# Patient Record
Sex: Female | Born: 1935
Health system: Southern US, Community
[De-identification: ages and names within clinical notes are randomized; demographics above are authoritative.]

## PROBLEM LIST (undated history)

## (undated) DIAGNOSIS — M8580 Other specified disorders of bone density and structure, unspecified site: Secondary | ICD-10-CM

## (undated) DIAGNOSIS — J301 Allergic rhinitis due to pollen: Secondary | ICD-10-CM

## (undated) DIAGNOSIS — K219 Gastro-esophageal reflux disease without esophagitis: Secondary | ICD-10-CM

## (undated) DIAGNOSIS — J45909 Unspecified asthma, uncomplicated: Secondary | ICD-10-CM

## (undated) DIAGNOSIS — E039 Hypothyroidism, unspecified: Secondary | ICD-10-CM

## (undated) DIAGNOSIS — K589 Irritable bowel syndrome without diarrhea: Secondary | ICD-10-CM

## (undated) DIAGNOSIS — F0390 Unspecified dementia without behavioral disturbance: Secondary | ICD-10-CM

## (undated) DIAGNOSIS — T7840XA Allergy, unspecified, initial encounter: Secondary | ICD-10-CM

## (undated) HISTORY — DX: Unspecified asthma, uncomplicated: J45.909

## (undated) HISTORY — DX: Allergic rhinitis due to pollen: J30.1

## (undated) HISTORY — DX: Unspecified dementia, unspecified severity, without behavioral disturbance, psychotic disturbance, mood disturbance, and anxiety: F03.90

## (undated) HISTORY — PX: TUBAL LIGATION: SHX77

## (undated) HISTORY — DX: Allergy, unspecified, initial encounter: T78.40XA

## (undated) HISTORY — PX: BASAL CELL CARCINOMA EXCISION: SHX1214

## (undated) HISTORY — DX: Gastro-esophageal reflux disease without esophagitis: K21.9

## (undated) HISTORY — PX: HEMORRHOID SURGERY: SHX153

## (undated) HISTORY — PX: CHOLECYSTECTOMY: SHX55

## (undated) HISTORY — PX: ANAL FISSURE REPAIR: SHX2312

## (undated) HISTORY — DX: Hypothyroidism, unspecified: E03.9

## (undated) HISTORY — DX: Other specified disorders of bone density and structure, unspecified site: M85.80

## (undated) HISTORY — DX: Irritable bowel syndrome, unspecified: K58.9

## (undated) HISTORY — PX: COLONOSCOPY: SHX174

## (undated) SURGERY — Surgical Case
Anesthesia: *Unknown

---

## 2006-05-02 HISTORY — PX: CHOLECYSTECTOMY: SHX55

## 2011-05-26 DIAGNOSIS — H903 Sensorineural hearing loss, bilateral: Secondary | ICD-10-CM | POA: Diagnosis not present

## 2011-05-26 DIAGNOSIS — H6120 Impacted cerumen, unspecified ear: Secondary | ICD-10-CM | POA: Diagnosis not present

## 2011-05-26 DIAGNOSIS — J370 Chronic laryngitis: Secondary | ICD-10-CM | POA: Diagnosis not present

## 2011-07-07 DIAGNOSIS — Q1389 Other congenital malformations of anterior segment of eye: Secondary | ICD-10-CM | POA: Diagnosis not present

## 2011-07-07 DIAGNOSIS — Q132 Other congenital malformations of iris: Secondary | ICD-10-CM | POA: Diagnosis not present

## 2011-07-07 DIAGNOSIS — H2510 Age-related nuclear cataract, unspecified eye: Secondary | ICD-10-CM | POA: Diagnosis not present

## 2011-07-07 DIAGNOSIS — L03019 Cellulitis of unspecified finger: Secondary | ICD-10-CM | POA: Diagnosis not present

## 2011-07-25 DIAGNOSIS — D485 Neoplasm of uncertain behavior of skin: Secondary | ICD-10-CM | POA: Diagnosis not present

## 2011-07-25 DIAGNOSIS — L738 Other specified follicular disorders: Secondary | ICD-10-CM | POA: Diagnosis not present

## 2011-08-24 DIAGNOSIS — H903 Sensorineural hearing loss, bilateral: Secondary | ICD-10-CM | POA: Diagnosis not present

## 2011-08-24 DIAGNOSIS — H6120 Impacted cerumen, unspecified ear: Secondary | ICD-10-CM | POA: Diagnosis not present

## 2011-08-24 DIAGNOSIS — J370 Chronic laryngitis: Secondary | ICD-10-CM | POA: Diagnosis not present

## 2011-10-12 DIAGNOSIS — H903 Sensorineural hearing loss, bilateral: Secondary | ICD-10-CM | POA: Diagnosis not present

## 2011-10-12 DIAGNOSIS — H6120 Impacted cerumen, unspecified ear: Secondary | ICD-10-CM | POA: Diagnosis not present

## 2011-11-23 DIAGNOSIS — H2510 Age-related nuclear cataract, unspecified eye: Secondary | ICD-10-CM | POA: Diagnosis not present

## 2011-12-12 ENCOUNTER — Encounter: Payer: Self-pay | Admitting: Internal Medicine

## 2011-12-12 ENCOUNTER — Ambulatory Visit (INDEPENDENT_AMBULATORY_CARE_PROVIDER_SITE_OTHER): Payer: Medicare Other | Admitting: Internal Medicine

## 2011-12-12 VITALS — BP 102/60 | HR 91 | Temp 98.0°F | Ht 64.0 in | Wt 126.0 lb

## 2011-12-12 DIAGNOSIS — M8580 Other specified disorders of bone density and structure, unspecified site: Secondary | ICD-10-CM

## 2011-12-12 DIAGNOSIS — K589 Irritable bowel syndrome without diarrhea: Secondary | ICD-10-CM

## 2011-12-12 DIAGNOSIS — J45909 Unspecified asthma, uncomplicated: Secondary | ICD-10-CM | POA: Diagnosis not present

## 2011-12-12 DIAGNOSIS — E039 Hypothyroidism, unspecified: Secondary | ICD-10-CM

## 2011-12-12 DIAGNOSIS — M899 Disorder of bone, unspecified: Secondary | ICD-10-CM

## 2011-12-12 DIAGNOSIS — K219 Gastro-esophageal reflux disease without esophagitis: Secondary | ICD-10-CM | POA: Diagnosis not present

## 2011-12-12 DIAGNOSIS — J301 Allergic rhinitis due to pollen: Secondary | ICD-10-CM

## 2011-12-12 LAB — CBC WITH DIFFERENTIAL/PLATELET
Basophils Absolute: 0 10*3/uL (ref 0.0–0.1)
Eosinophils Absolute: 0.1 10*3/uL (ref 0.0–0.7)
HCT: 43 % (ref 36.0–46.0)
Hemoglobin: 14.3 g/dL (ref 12.0–15.0)
Lymphs Abs: 1.3 10*3/uL (ref 0.7–4.0)
MCHC: 33.3 g/dL (ref 30.0–36.0)
MCV: 93.9 fl (ref 78.0–100.0)
Monocytes Absolute: 0.4 10*3/uL (ref 0.1–1.0)
Neutro Abs: 2.5 10*3/uL (ref 1.4–7.7)
RDW: 13.4 % (ref 11.5–14.6)

## 2011-12-12 LAB — HEPATIC FUNCTION PANEL
AST: 21 U/L (ref 0–37)
Albumin: 4 g/dL (ref 3.5–5.2)

## 2011-12-12 LAB — BASIC METABOLIC PANEL
BUN: 20 mg/dL (ref 6–23)
CO2: 30 mEq/L (ref 19–32)
Glucose, Bld: 92 mg/dL (ref 70–99)
Potassium: 4.7 mEq/L (ref 3.5–5.1)
Sodium: 140 mEq/L (ref 135–145)

## 2011-12-12 LAB — LIPID PANEL
Cholesterol: 184 mg/dL (ref 0–200)
VLDL: 11.2 mg/dL (ref 0.0–40.0)

## 2011-12-12 LAB — T4, FREE: Free T4: 0.79 ng/dL (ref 0.60–1.60)

## 2011-12-12 LAB — TSH: TSH: 2.25 u[IU]/mL (ref 0.35–5.50)

## 2011-12-12 NOTE — Progress Notes (Signed)
Subjective:    Patient ID: Ariel Phillips, female    DOB: 13-Mar-1933, 78 y.o.   MRN: [id number]  HPI Moved to [facility] about 2.5 months ago Lives in [place] from [place] Daughter in [place] She likes it here  Has hypothyroid which goes back many years Does fine on the meds  Has GERD Unpredictable ---has had ER visits due to severity of pain in past Uses the prevacid for 14 days at a time--then goes without it ?elevated liver test when took daily Prefers tums---will use when she gets tight feeling around upper abdomen---not really post prandial (2 hours after more like)  Allergic rhinitis is a significant issue Satisfied with allegra Allergic asthma in past but no recent problems Uses zaditor for allergies  Has IBS Variable timing and severity No meds for this Does have history of polyps in past was told 10 year recall after her 2009 colonoscopy  Current Outpatient Prescriptions on File Prior to Visit  Medication Sig Dispense Refill  . azelastine (ASTELIN) 137 MCG/SPRAY nasal spray Place 1 spray into the nose daily.       . Calcium Carbonate (CALCIUM 600 PO) Take 2 tablets by mouth daily.      . fexofenadine (ALLEGRA) 180 MG tablet Take 180 mg by mouth daily.      . lansoprazole (PREVACID) 15 MG capsule Take 15 mg by mouth daily.      Marland Kitchen levothyroxine (SYNTHROID, LEVOTHROID) 50 MCG tablet Take 50 mcg by mouth daily.         No Known Allergies  Past Medical History  Diagnosis Date  . Allergic rhinitis due to pollen   . Allergic asthma   . GERD (gastroesophageal reflux disease)   . IBS (irritable bowel syndrome)   . Unspecified hypothyroidism   . Osteopenia     Past Surgical History  Procedure Date  . Cholecystectomy   . Tubal ligation   . Hemorrhoid surgery 1953 or so    with fissure repair    Family History  Problem Relation Age of Onset  . COPD Mother   . Cancer Mother     unclear diagnosis  . Diabetes Sister   . Hypertension Sister   .  Cancer Paternal Grandmother     ?breast  . Heart disease Neg Hx   . Stroke Other     History   Social History  . Marital Status: Divorced    Spouse Name: N/A    Number of Children: 1  . Years of Education: N/A   Occupational History  . Retired historian--consultant    Social History Main Topics  . Smoking status: Never Smoker   . Smokeless tobacco: Never Used  . Alcohol Use: Yes     rare wine  . Drug Use: No  . Sexually Active: Not on file   Other Topics Concern  . Not on file   Social History Narrative   ! [place] has health care POA.Would accept resuscitation attempts--not sure about DNR at this pointWould not want prolonged mechanical ventilationMay accept tube feeds temporarily but not if cognitively unaware   Review of Systems  Constitutional: Positive for unexpected weight change. Negative for fatigue.       Has lost some weight recently Wears seat belt  HENT: Positive for hearing loss, congestion, rhinorrhea and dental problem.        Wears hearing aides Partial dentures Bad teeth in general--needs to find a local dentist  Occ hoarseness  Eyes: Positive for visual disturbance.       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 Sees Dr Dingledein  Respiratory: Negative for cough, chest tightness, shortness of breath and wheezing.   Cardiovascular: Positive for chest pain and leg swelling. Negative for palpitations.       Has had GERD symptoms "masquerading as chest pain" Had some swelling in feet when driving and during move--urine dark then (has cleared up since)  Genitourinary: Negative for dysuria and difficulty urinating.       No sex---no problem  Musculoskeletal: Positive for back pain and arthralgias. Negative for joint swelling.       Occ low back pain--no meds Pain in both CMC's  Skin: Negative for pallor and rash.  Neurological: Positive for dizziness.  Negative for syncope, weakness, light-headedness and headaches.       Has AM dizziness---not vertigo (though she has had this in past). Has to hold onto things---sounds vestibular.  Hematological: Negative for adenopathy. Does not bruise/bleed easily.  Psychiatric/Behavioral: Negative for disturbed wake/sleep cycle and dysphoric mood. The patient is not nervous/anxious.        Objective:   Physical Exam  Constitutional: She is oriented to person, place, and time. She appears well-developed and well-nourished. No distress.  HENT:  Head: Normocephalic and atraumatic.  Mouth/Throat: Oropharynx is clear and moist. No oropharyngeal exudate.  Eyes: Conjunctivae and EOM are normal.       Irregular left pupil---dimished vision there  Neck: Normal range of motion. Neck supple. No thyromegaly present.  Cardiovascular: Normal rate, regular rhythm, normal heart sounds and intact distal pulses.  Exam reveals no gallop.   No murmur heard.      Frequent skipped beats  Pulmonary/Chest: Effort normal and breath sounds normal. No respiratory distress. She has no wheezes. She has no rales.  Abdominal: Soft. There is no tenderness.  Musculoskeletal: She exhibits no edema and no tenderness.  Lymphadenopathy:    She has no cervical adenopathy.  Neurological: She is alert and oriented to person, place, and time.  Skin: No rash noted. No erythema.  Psychiatric: She has a normal mood and affect. Her behavior is normal. Thought content normal.          Assessment & Plan:

## 2011-12-12 NOTE — Assessment & Plan Note (Signed)
Has intermittent but hard to predict symptoms Discussed regular PPI---like every other day or every 3rd day

## 2011-12-12 NOTE — Assessment & Plan Note (Signed)
Seems to be euthryroid Will check labs

## 2011-12-12 NOTE — Assessment & Plan Note (Signed)
Satisfied with regimen for now No changes

## 2011-12-12 NOTE — Assessment & Plan Note (Signed)
Regular exercise On calcium and vitamin D

## 2011-12-12 NOTE — Assessment & Plan Note (Signed)
If symptoms pick up here, especially after sensitization, would consider montelukast

## 2011-12-12 NOTE — Assessment & Plan Note (Signed)
Mild symptoms No meds Would start with fiber supplements

## 2011-12-13 ENCOUNTER — Encounter: Payer: Self-pay | Admitting: *Deleted

## 2011-12-22 ENCOUNTER — Other Ambulatory Visit: Payer: Self-pay

## 2011-12-22 MED ORDER — LEVOTHYROXINE SODIUM 50 MCG PO TABS: 50.0000 ug | ORAL_TABLET | Freq: Every day | ORAL | Status: DC

## 2011-12-22 NOTE — Telephone Encounter (Signed)
Pt request levothyroxine 90 day to [facility]. Pt notified while on phone refill done.

## 2011-12-29 DIAGNOSIS — H5710 Ocular pain, unspecified eye: Secondary | ICD-10-CM | POA: Diagnosis not present

## 2012-01-13 DIAGNOSIS — L821 Other seborrheic keratosis: Secondary | ICD-10-CM | POA: Diagnosis not present

## 2012-01-13 DIAGNOSIS — L723 Sebaceous cyst: Secondary | ICD-10-CM | POA: Diagnosis not present

## 2012-02-18 DIAGNOSIS — Z23 Encounter for immunization: Secondary | ICD-10-CM | POA: Diagnosis not present

## 2012-05-09 ENCOUNTER — Telehealth: Payer: Self-pay

## 2012-05-09 MED ORDER — ALBUTEROL SULFATE HFA 108 (90 BASE) MCG/ACT IN AERS: 2.0000 | INHALATION_SPRAY | Freq: Three times a day (TID) | RESPIRATORY_TRACT | Status: DC | PRN

## 2012-05-09 NOTE — Telephone Encounter (Signed)
Okay to send Rx for albuterol 2 puffs tid prn #1 x 1   I recommend Dr Shawnie Pons at the [facility] office

## 2012-05-09 NOTE — Telephone Encounter (Signed)
Pt request a quick acting inhaler (pt has used albuterol inhaler in past with good results)the patient not having a problem but wants to have on hand. Pt also request name of female GYN (pt said past due for pap and mammogram)the patient does not want a referral.Please advise. [facility].

## 2012-05-09 NOTE — Telephone Encounter (Signed)
rx sent to pharmacy by e-script Spoke with patient and advised results   

## 2012-05-16 ENCOUNTER — Encounter: Payer: Self-pay | Admitting: Family Medicine

## 2012-05-16 ENCOUNTER — Telehealth: Payer: Self-pay

## 2012-05-16 ENCOUNTER — Ambulatory Visit (INDEPENDENT_AMBULATORY_CARE_PROVIDER_SITE_OTHER): Payer: Medicare Other | Admitting: Family Medicine

## 2012-05-16 VITALS — BP 94/60 | HR 66 | Temp 97.5°F | Ht 64.0 in | Wt 133.8 lb

## 2012-05-16 DIAGNOSIS — N390 Urinary tract infection, site not specified: Secondary | ICD-10-CM | POA: Diagnosis not present

## 2012-05-16 DIAGNOSIS — R82998 Other abnormal findings in urine: Secondary | ICD-10-CM | POA: Diagnosis not present

## 2012-05-16 DIAGNOSIS — R3989 Other symptoms and signs involving the genitourinary system: Secondary | ICD-10-CM

## 2012-05-16 LAB — POCT URINALYSIS DIPSTICK
Ketones, UA: NEGATIVE
Protein, UA: NEGATIVE
Spec Grav, UA: 1.01

## 2012-05-16 MED ORDER — SULFAMETHOXAZOLE-TRIMETHOPRIM 800-160 MG PO TABS: 1.0000 | ORAL_TABLET | Freq: Two times a day (BID) | ORAL | Status: DC

## 2012-05-16 NOTE — Progress Notes (Signed)
Subjective:    Patient ID: Ariel Phillips, female    DOB: 1932-12-05, 79 y.o.   MRN: [id number]  HPI Here for dark colored urine for 3 days No uti symptoms   On Monday she did feel under the weather Was nauseated and then got chills (not body aches), and then vomited times one  By yesterday was fine   Thought she may have been dehydrated  Drinking lots of water   No sore throat or other symptoms   No back pain   Today ua pos for blood and some leukocytes   Patient Active Problem List  Diagnosis  . Allergic rhinitis due to pollen  . Allergic asthma  . GERD (gastroesophageal reflux disease)  . IBS (irritable bowel syndrome)  . Osteopenia  . Hypothyroidism   Past Medical History  Diagnosis Date  . Allergic rhinitis due to pollen   . Allergic asthma   . GERD (gastroesophageal reflux disease)   . IBS (irritable bowel syndrome)   . Unspecified hypothyroidism   . Osteopenia    Past Surgical History  Procedure Date  . Cholecystectomy   . Tubal ligation   . Hemorrhoid surgery 1953 or so    with fissure repair  . Basal cell carcinoma excision 2000's    nose   History  Substance Use Topics  . Smoking status: Never Smoker   . Smokeless tobacco: Never Used  . Alcohol Use: Yes     Comment: rare wine   Family History  Problem Relation Age of Onset  . COPD Mother   . Cancer Mother     unclear diagnosis  . Diabetes Sister   . Hypertension Sister   . Cancer Paternal Grandmother     ?breast  . Heart disease Neg Hx   . Stroke Other    No Known Allergies Current Outpatient Prescriptions on File Prior to Visit  Medication Sig Dispense Refill  . albuterol (PROVENTIL HFA;VENTOLIN HFA) 108 (90 BASE) MCG/ACT inhaler Inhale 2 puffs into the lungs 3 (three) times daily as needed.  1 Inhaler  1  . aspirin 81 MG tablet Take 81 mg by mouth daily.      Marland Kitchen azelastine (ASTELIN) 137 MCG/SPRAY nasal spray Place 1 spray into the nose daily.       . Calcium Carbonate (CALCIUM 600  PO) Take 2 tablets by mouth daily.      . fexofenadine (ALLEGRA) 180 MG tablet Take 180 mg by mouth daily.      Marland Kitchen ketotifen (ZADITOR) 0.025 % ophthalmic solution Place 1 drop into both eyes daily.      . lansoprazole (PREVACID) 15 MG capsule Take 15 mg by mouth as needed.       Marland Kitchen levothyroxine (SYNTHROID, LEVOTHROID) 50 MCG tablet Take 1 tablet (50 mcg total) by mouth daily.  90 tablet  3  . Probiotic Product (PROBIOTIC DAILY) CAPS Take 1 capsule by mouth daily.          Review of Systems Review of Systems  Constitutional: Negative for fever, appetite change, fatigue and unexpected weight change.  Eyes: Negative for pain and visual disturbance.  Respiratory: Negative for cough and shortness of breath.   Cardiovascular: Negative for cp or palpitations    Gastrointestinal: Negative for nausea, diarrhea and constipation.  Genitourinary: Negative for urgency and frequency. neg for blood in urine but pos for urine darker than usual, neg for flank pain  Skin: Negative for pallor or rash   Neurological: Negative for weakness, light-headedness, numbness and  headaches.  Hematological: Negative for adenopathy. Does not bruise/bleed easily.  Psychiatric/Behavioral: Negative for dysphoric mood. The patient is not nervous/anxious.         Objective:   Physical Exam  Constitutional: She appears well-developed and well-nourished.  HENT:  Head: Normocephalic and atraumatic.  Mouth/Throat: Oropharynx is clear and moist.  Eyes: Conjunctivae normal and EOM are normal. Pupils are equal, round, and reactive to light. No scleral icterus.  Neck: Normal range of motion. Neck supple.  Cardiovascular: Normal rate and regular rhythm.   Pulmonary/Chest: Effort normal and breath sounds normal.  Abdominal: Soft. Bowel sounds are normal. She exhibits no distension and no mass. There is no tenderness.       No suprapubic tenderness or fullness    Musculoskeletal:       No cva tenderness   Lymphadenopathy:     She has no cervical adenopathy.  Neurological: She is alert.  Skin: Skin is warm and dry. No pallor.  Psychiatric: She has a normal mood and affect.          Assessment & Plan:

## 2012-05-16 NOTE — Patient Instructions (Addendum)
Take the septra as directed  Drink lots of water  We will contact you when urine culture returns If symptoms change or worsen in the meantime - please alert [place]

## 2012-05-16 NOTE — Assessment & Plan Note (Signed)
Abn ua with (per pt) dark urine  Nausea several days ago and some tenderness over bladder Some blood on ua  cx urine  Cover with 3 d of septra while waiting on result Pt will update if worse or not imp

## 2012-05-16 NOTE — Telephone Encounter (Signed)
Pt has dark yellow to brown colored urine for 3 days. Happened once before when pt was dehydrated; pt has started drinkiing more water. No back or abdominal pain and no pain or burning when urinates. Dr Alphonsus Sias has no available appts. Pt to see Dr Milinda Antis today at 11:45 am.

## 2012-05-16 NOTE — Telephone Encounter (Signed)
Pt seen

## 2012-05-17 LAB — POCT UA - MICROSCOPIC ONLY: Yeast, UA: 0

## 2012-05-18 ENCOUNTER — Telehealth: Payer: Self-pay | Admitting: Internal Medicine

## 2012-05-18 LAB — URINE CULTURE
Colony Count: NO GROWTH
Organism ID, Bacteria: NO GROWTH

## 2012-05-18 NOTE — Telephone Encounter (Signed)
Left voicemail requesting pt to call office, will try to call back later 

## 2012-05-18 NOTE — Telephone Encounter (Signed)
Patient seen by Dr. Milinda Antis for a UTI.  She was given antibiotics pending her urine culture results.  She will be out of her antibiotics by tomorrow and wants to know if she needs more, a different type of antibiotics, or none at all.  Please call pt [phone]

## 2012-05-18 NOTE — Telephone Encounter (Signed)
Pt notified no growth in urine cx so no abx needed but f/u scheduled with PCP to re-check urine in 2 weeks

## 2012-05-22 ENCOUNTER — Ambulatory Visit (INDEPENDENT_AMBULATORY_CARE_PROVIDER_SITE_OTHER): Payer: Medicare Other | Admitting: Family Medicine

## 2012-05-22 ENCOUNTER — Encounter: Payer: Self-pay | Admitting: Family Medicine

## 2012-05-22 VITALS — BP 115/69 | HR 64 | Ht 64.0 in | Wt 131.8 lb

## 2012-05-22 DIAGNOSIS — Z01419 Encounter for gynecological examination (general) (routine) without abnormal findings: Secondary | ICD-10-CM | POA: Diagnosis not present

## 2012-05-22 DIAGNOSIS — Z1231 Encounter for screening mammogram for malignant neoplasm of breast: Secondary | ICD-10-CM

## 2012-05-22 NOTE — Patient Instructions (Signed)
Preventive Care for Adults, Female A healthy lifestyle and preventive care can promote health and wellness. Preventive health guidelines for women include the following key practices.  A routine yearly physical is a good way to check with your caregiver about your health and preventive screening. It is a chance to share any concerns and updates on your health, and to receive a thorough exam.  Visit your dentist for a routine exam and preventive care every 6 months. Brush your teeth twice a day and floss once a day. Good oral hygiene prevents tooth decay and gum disease.  The frequency of eye exams is based on your age, health, family medical history, use of contact lenses, and other factors. Follow your caregiver's recommendations for frequency of eye exams.  Eat a healthy diet. Foods like vegetables, fruits, whole grains, low-fat dairy products, and lean protein foods contain the nutrients you need without too many calories. Decrease your intake of foods high in solid fats, added sugars, and salt. Eat the right amount of calories for you.Get information about a proper diet from your caregiver, if necessary.  Regular physical exercise is one of the most important things you can do for your health. Most adults should get at least 150 minutes of moderate-intensity exercise (any activity that increases your heart rate and causes you to sweat) each week. In addition, most adults need muscle-strengthening exercises on 2 or more days a week.  Maintain a healthy weight. The body mass index (BMI) is a screening tool to identify possible weight problems. It provides an estimate of body fat based on height and weight. Your caregiver can help determine your BMI, and can help you achieve or maintain a healthy weight.For adults 20 years and older:  A BMI below 18.5 is considered underweight.  A BMI of 18.5 to 24.9 is normal.  A BMI of 25 to 29.9 is considered overweight.  A BMI of 30 and above is  considered obese.  Maintain normal blood lipids and cholesterol levels by exercising and minimizing your intake of saturated fat. Eat a balanced diet with plenty of fruit and vegetables. Blood tests for lipids and cholesterol should begin at age 20 and be repeated every 5 years. If your lipid or cholesterol levels are high, you are over 50, or you are at high risk for heart disease, you may need your cholesterol levels checked more frequently.Ongoing high lipid and cholesterol levels should be treated with medicines if diet and exercise are not effective.  If you smoke, find out from your caregiver how to quit. If you do not use tobacco, do not start.  If you are pregnant, do not drink alcohol. If you are breastfeeding, be very cautious about drinking alcohol. If you are not pregnant and choose to drink alcohol, do not exceed 1 drink per day. One drink is considered to be 12 ounces (355 mL) of beer, 5 ounces (148 mL) of wine, or 1.5 ounces (44 mL) of liquor.  Avoid use of street drugs. Do not share needles with anyone. Ask for help if you need support or instructions about stopping the use of drugs.  High blood pressure causes heart disease and increases the risk of stroke. Your blood pressure should be checked at least every 1 to 2 years. Ongoing high blood pressure should be treated with medicines if weight loss and exercise are not effective.  If you are 55 to 77 years old, ask your caregiver if you should take aspirin to prevent strokes.  Diabetes   screening involves taking a blood sample to check your fasting blood sugar level. This should be done once every 3 years, after age 45, if you are within normal weight and without risk factors for diabetes. Testing should be considered at a younger age or be carried out more frequently if you are overweight and have at least 1 risk factor for diabetes.  Breast cancer screening is essential preventive care for women. You should practice "breast  self-awareness." This means understanding the normal appearance and feel of your breasts and may include breast self-examination. Any changes detected, no matter how small, should be reported to a caregiver. Women in their 20s and 30s should have a clinical breast exam (CBE) by a caregiver as part of a regular health exam every 1 to 3 years. After age 40, women should have a CBE every year. Starting at age 40, women should consider having a mammography (breast X-ray test) every year. Women who have a family history of breast cancer should talk to their caregiver about genetic screening. Women at a high risk of breast cancer should talk to their caregivers about having magnetic resonance imaging (MRI) and a mammography every year.  The Pap test is a screening test for cervical cancer. A Pap test can show cell changes on the cervix that might become cervical cancer if left untreated. A Pap test is a procedure in which cells are obtained and examined from the lower end of the uterus (cervix).  Women should have a Pap test starting at age 21.  Between ages 21 and 29, Pap tests should be repeated every 2 years.  Beginning at age 30, you should have a Pap test every 3 years as long as the past 3 Pap tests have been normal.  Some women have medical problems that increase the chance of getting cervical cancer. Talk to your caregiver about these problems. It is especially important to talk to your caregiver if a new problem develops soon after your last Pap test. In these cases, your caregiver may recommend more frequent screening and Pap tests.  The above recommendations are the same for women who have or have not gotten the vaccine for human papillomavirus (HPV).  If you had a hysterectomy for a problem that was not cancer or a condition that could lead to cancer, then you no longer need Pap tests. Even if you no longer need a Pap test, a regular exam is a good idea to make sure no other problems are  starting.  If you are between ages 65 and 70, and you have had normal Pap tests going back 10 years, you no longer need Pap tests. Even if you no longer need a Pap test, a regular exam is a good idea to make sure no other problems are starting.  If you have had past treatment for cervical cancer or a condition that could lead to cancer, you need Pap tests and screening for cancer for at least 20 years after your treatment.  If Pap tests have been discontinued, risk factors (such as a new sexual partner) need to be reassessed to determine if screening should be resumed.  The HPV test is an additional test that may be used for cervical cancer screening. The HPV test looks for the virus that can cause the cell changes on the cervix. The cells collected during the Pap test can be tested for HPV. The HPV test could be used to screen women aged 30 years and older, and should   be used in women of any age who have unclear Pap test results. After the age of 30, women should have HPV testing at the same frequency as a Pap test.  Colorectal cancer can be detected and often prevented. Most routine colorectal cancer screening begins at the age of 50 and continues through age 75. However, your caregiver may recommend screening at an earlier age if you have risk factors for colon cancer. On a yearly basis, your caregiver may provide home test kits to check for hidden blood in the stool. Use of a small camera at the end of a tube, to directly examine the colon (sigmoidoscopy or colonoscopy), can detect the earliest forms of colorectal cancer. Talk to your caregiver about this at age 50, when routine screening begins. Direct examination of the colon should be repeated every 5 to 10 years through age 75, unless early forms of pre-cancerous polyps or small growths are found.  Hepatitis C blood testing is recommended for all people born from 1945 through 1965 and any individual with known risks for hepatitis C.  Practice  safe sex. Use condoms and avoid high-risk sexual practices to reduce the spread of sexually transmitted infections (STIs). STIs include gonorrhea, chlamydia, syphilis, trichomonas, herpes, HPV, and human immunodeficiency virus (HIV). Herpes, HIV, and HPV are viral illnesses that have no cure. They can result in disability, cancer, and death. Sexually active women aged 25 and younger should be checked for chlamydia. Older women with new or multiple partners should also be tested for chlamydia. Testing for other STIs is recommended if you are sexually active and at increased risk.  Osteoporosis is a disease in which the bones lose minerals and strength with aging. This can result in serious bone fractures. The risk of osteoporosis can be identified using a bone density scan. Women ages 65 and over and women at risk for fractures or osteoporosis should discuss screening with their caregivers. Ask your caregiver whether you should take a calcium supplement or vitamin D to reduce the rate of osteoporosis.  Menopause can be associated with physical symptoms and risks. Hormone replacement therapy is available to decrease symptoms and risks. You should talk to your caregiver about whether hormone replacement therapy is right for you.  Use sunscreen with sun protection factor (SPF) of 30 or more. Apply sunscreen liberally and repeatedly throughout the day. You should seek shade when your shadow is shorter than you. Protect yourself by wearing long sleeves, pants, a wide-brimmed hat, and sunglasses year round, whenever you are outdoors.  Once a month, do a whole body skin exam, using a mirror to look at the skin on your back. Notify your caregiver of new moles, moles that have irregular borders, moles that are larger than a pencil eraser, or moles that have changed in shape or color.  Stay current with required immunizations.  Influenza. You need a dose every fall (or winter). The composition of the flu vaccine  changes each year, so being vaccinated once is not enough.  Pneumococcal polysaccharide. You need 1 to 2 doses if you smoke cigarettes or if you have certain chronic medical conditions. You need 1 dose at age 65 (or older) if you have never been vaccinated.  Tetanus, diphtheria, pertussis (Tdap, Td). Get 1 dose of Tdap vaccine if you are younger than age 65, are over 65 and have contact with an infant, are a healthcare worker, are pregnant, or simply want to be protected from whooping cough. After that, you need a Td   booster dose every 10 years. Consult your caregiver if you have not had at least 3 tetanus and diphtheria-containing shots sometime in your life or have a deep or dirty wound.  HPV. You need this vaccine if you are a woman age 26 or younger. The vaccine is given in 3 doses over 6 months.  Measles, mumps, rubella (MMR). You need at least 1 dose of MMR if you were born in 1957 or later. You may also need a second dose.  Meningococcal. If you are age 19 to 21 and a first-year college student living in a residence hall, or have one of several medical conditions, you need to get vaccinated against meningococcal disease. You may also need additional booster doses.  Zoster (shingles). If you are age 60 or older, you should get this vaccine.  Varicella (chickenpox). If you have never had chickenpox or you were vaccinated but received only 1 dose, talk to your caregiver to find out if you need this vaccine.  Hepatitis A. You need this vaccine if you have a specific risk factor for hepatitis A virus infection or you simply wish to be protected from this disease. The vaccine is usually given as 2 doses, 6 to 18 months apart.  Hepatitis B. You need this vaccine if you have a specific risk factor for hepatitis B virus infection or you simply wish to be protected from this disease. The vaccine is given in 3 doses, usually over 6 months. Preventive Services / Frequency Ages 19 to 39  Blood  pressure check.** / Every 1 to 2 years.  Lipid and cholesterol check.** / Every 5 years beginning at age 20.  Clinical breast exam.** / Every 3 years for women in their 20s and 30s.  Pap test.** / Every 2 years from ages 21 through 29. Every 3 years starting at age 30 through age 65 or 70 with a history of 3 consecutive normal Pap tests.  HPV screening.** / Every 3 years from ages 30 through ages 65 to 70 with a history of 3 consecutive normal Pap tests.  Hepatitis C blood test.** / For any individual with known risks for hepatitis C.  Skin self-exam. / Monthly.  Influenza immunization.** / Every year.  Pneumococcal polysaccharide immunization.** / 1 to 2 doses if you smoke cigarettes or if you have certain chronic medical conditions.  Tetanus, diphtheria, pertussis (Tdap, Td) immunization. / A one-time dose of Tdap vaccine. After that, you need a Td booster dose every 10 years.  HPV immunization. / 3 doses over 6 months, if you are 26 and younger.  Measles, mumps, rubella (MMR) immunization. / You need at least 1 dose of MMR if you were born in 1957 or later. You may also need a second dose.  Meningococcal immunization. / 1 dose if you are age 19 to 21 and a first-year college student living in a residence hall, or have one of several medical conditions, you need to get vaccinated against meningococcal disease. You may also need additional booster doses.  Varicella immunization.** / Consult your caregiver.  Hepatitis A immunization.** / Consult your caregiver. 2 doses, 6 to 18 months apart.  Hepatitis B immunization.** / Consult your caregiver. 3 doses usually over 6 months. Ages 40 to 64  Blood pressure check.** / Every 1 to 2 years.  Lipid and cholesterol check.** / Every 5 years beginning at age 20.  Clinical breast exam.** / Every year after age 40.  Mammogram.** / Every year beginning at age 40   and continuing for as long as you are in good health. Consult with your  caregiver.  Pap test.** / Every 3 years starting at age 30 through age 65 or 70 with a history of 3 consecutive normal Pap tests.  HPV screening.** / Every 3 years from ages 30 through ages 65 to 70 with a history of 3 consecutive normal Pap tests.  Fecal occult blood test (FOBT) of stool. / Every year beginning at age 50 and continuing until age 75. You may not need to do this test if you get a colonoscopy every 10 years.  Flexible sigmoidoscopy or colonoscopy.** / Every 5 years for a flexible sigmoidoscopy or every 10 years for a colonoscopy beginning at age 50 and continuing until age 75.  Hepatitis C blood test.** / For all people born from 1945 through 1965 and any individual with known risks for hepatitis C.  Skin self-exam. / Monthly.  Influenza immunization.** / Every year.  Pneumococcal polysaccharide immunization.** / 1 to 2 doses if you smoke cigarettes or if you have certain chronic medical conditions.  Tetanus, diphtheria, pertussis (Tdap, Td) immunization.** / A one-time dose of Tdap vaccine. After that, you need a Td booster dose every 10 years.  Measles, mumps, rubella (MMR) immunization. / You need at least 1 dose of MMR if you were born in 1957 or later. You may also need a second dose.  Varicella immunization.** / Consult your caregiver.  Meningococcal immunization.** / Consult your caregiver.  Hepatitis A immunization.** / Consult your caregiver. 2 doses, 6 to 18 months apart.  Hepatitis B immunization.** / Consult your caregiver. 3 doses, usually over 6 months. Ages 65 and over  Blood pressure check.** / Every 1 to 2 years.  Lipid and cholesterol check.** / Every 5 years beginning at age 20.  Clinical breast exam.** / Every year after age 40.  Mammogram.** / Every year beginning at age 40 and continuing for as long as you are in good health. Consult with your caregiver.  Pap test.** / Every 3 years starting at age 30 through age 65 or 70 with a 3  consecutive normal Pap tests. Testing can be stopped between 65 and 70 with 3 consecutive normal Pap tests and no abnormal Pap or HPV tests in the past 10 years.  HPV screening.** / Every 3 years from ages 30 through ages 65 or 70 with a history of 3 consecutive normal Pap tests. Testing can be stopped between 65 and 70 with 3 consecutive normal Pap tests and no abnormal Pap or HPV tests in the past 10 years.  Fecal occult blood test (FOBT) of stool. / Every year beginning at age 50 and continuing until age 75. You may not need to do this test if you get a colonoscopy every 10 years.  Flexible sigmoidoscopy or colonoscopy.** / Every 5 years for a flexible sigmoidoscopy or every 10 years for a colonoscopy beginning at age 50 and continuing until age 75.  Hepatitis C blood test.** / For all people born from 1945 through 1965 and any individual with known risks for hepatitis C.  Osteoporosis screening.** / A one-time screening for women ages 65 and over and women at risk for fractures or osteoporosis.  Skin self-exam. / Monthly.  Influenza immunization.** / Every year.  Pneumococcal polysaccharide immunization.** / 1 dose at age 65 (or older) if you have never been vaccinated.  Tetanus, diphtheria, pertussis (Tdap, Td) immunization. / A one-time dose of Tdap vaccine if you are over   65 and have contact with an infant, are a healthcare worker, or simply want to be protected from whooping cough. After that, you need a Td booster dose every 10 years.  Varicella immunization.** / Consult your caregiver.  Meningococcal immunization.** / Consult your caregiver.  Hepatitis A immunization.** / Consult your caregiver. 2 doses, 6 to 18 months apart.  Hepatitis B immunization.** / Check with your caregiver. 3 doses, usually over 6 months. ** Family history and personal history of risk and conditions may change your caregiver's recommendations. Document Released: 06/14/2001 Document Revised: 07/11/2011  Document Reviewed: 09/13/2010 [employer] Patient Information 2013 [employer].  

## 2012-05-22 NOTE — Progress Notes (Signed)
  Subjective:     Ariel Phillips is a 79 y.o. female and is here for a comprehensive physical exam. The patient reports no problems.She is a new pt. Today.  Needs mammogram.  No h/o abnl paps.  Divorced, not sexually active.  Moved here from [place], still working from home for [employer].  Lives in [facility] assisted living facility.  Sees Dr. Alphonsus Sias.  History   Social History  . Marital Status: Divorced    Spouse Name: N/A    Number of Children: 1  . Years of Education: N/A   Occupational History  . Retired historian--consultant    Social History Main Topics  . Smoking status: Never Smoker   . Smokeless tobacco: Never Used  . Alcohol Use: Yes     Comment: rare wine  . Drug Use: No  . Sexually Active: Not on file   Other Topics Concern  . Not on file   Social History Narrative   ! [place] in [place] has health care POA.Would accept resuscitation attempts--not sure about DNR at this pointWould not want prolonged mechanical ventilationMay accept tube feeds temporarily but not if cognitively unaware   Health Maintenance  Topic Date Due  . Mammogram  03/03/1985  . Zostavax  03/04/1995  . Influenza Vaccine  12/31/2012  . Colonoscopy  05/02/2017  . Tetanus/tdap  02/15/2021  . Pneumococcal Polysaccharide Vaccine Age 41 And Over  Completed    The following portions of the patient's history were reviewed and updated as appropriate: allergies, current medications, past family history, past medical history, past social history, past surgical history and problem list.  Review of Systems A comprehensive review of systems was negative except for: Cardiovascular: positive for irregular heart beat   Objective:    BP 115/69  Pulse 64  Ht 5\' 4"  (1.626 m)  Wt 131 lb 12.8 oz (59.784 kg)  BMI 22.62 kg/m2 General appearance: alert, cooperative and appears stated age Head: Normocephalic, without obvious abnormality,  atraumatic Neck: no adenopathy, supple, symmetrical, trachea midline and thyroid not enlarged, symmetric, no tenderness/mass/nodules Lungs: clear to auscultation bilaterally Breasts: normal appearance, no masses or tenderness Heart: regular rate and rhythm, S1, S2 normal, no murmur, click, rub or gallop Abdomen: normal findings: aorta normal, no bruits heard, no organomegaly and soft, non-tender Pelvic: cervix normal in appearance, external genitalia normal, no adnexal masses or tenderness, no cervical motion tenderness, uterus normal size, shape, and consistency, vagina normal without discharge and ovaries not felt with confidence, vaginal atrophy Extremities: extremities normal, atraumatic, no cyanosis or edema Pulses: 2+ and symmetric Skin: Skin color, texture, turgor normal. No rashes or lesions Lymph nodes: Cervical, supraclavicular, and axillary nodes normal. Neurologic: Grossly normal    Assessment:    Healthy female exam. Menopausal     Plan:     Does not need pap smears. Annual screening for adnexal masses and mammography. See After Visit Summary for Counseling Recommendations

## 2012-05-29 ENCOUNTER — Ambulatory Visit (HOSPITAL_COMMUNITY)
Admission: RE | Admit: 2012-05-29 | Discharge: 2012-05-29 | Disposition: A | Discharge status: Home / Self Care | Payer: Medicare Other | Source: Ambulatory Visit | Attending: Family Medicine | Admitting: Family Medicine

## 2012-05-29 DIAGNOSIS — Z01419 Encounter for gynecological examination (general) (routine) without abnormal findings: Secondary | ICD-10-CM

## 2012-05-29 DIAGNOSIS — Z1231 Encounter for screening mammogram for malignant neoplasm of breast: Secondary | ICD-10-CM | POA: Insufficient documentation

## 2012-06-01 ENCOUNTER — Encounter: Payer: Self-pay | Admitting: Internal Medicine

## 2012-06-01 ENCOUNTER — Ambulatory Visit (INDEPENDENT_AMBULATORY_CARE_PROVIDER_SITE_OTHER): Payer: Medicare Other | Admitting: Internal Medicine

## 2012-06-01 VITALS — BP 100/60 | HR 56 | Temp 97.7°F | Wt 135.0 lb

## 2012-06-01 DIAGNOSIS — R82998 Other abnormal findings in urine: Secondary | ICD-10-CM | POA: Diagnosis not present

## 2012-06-01 DIAGNOSIS — R3989 Other symptoms and signs involving the genitourinary system: Secondary | ICD-10-CM

## 2012-06-01 NOTE — Progress Notes (Signed)
  Subjective:    Patient ID: Tayia Stonesifer, female    DOB: [birth year], 79 y.o.   MRN: [id number]  HPI Here for follow up She has been having a separate spell of dark urine again Past urine culture was clear Took the antibiotic for 3 days  No dysuria Goes fairly frequently without change No urgency Recent gyn exam was fine  Does vigorous exercise class --but usually on thursdays  Current Outpatient Prescriptions on File Prior to Visit  Medication Sig Dispense Refill  . azelastine (ASTELIN) 137 MCG/SPRAY nasal spray Place 1 spray into the nose daily.       . Calcium Carbonate (CALCIUM 600 PO) Take 2 tablets by mouth daily.      Marland Kitchen conjugated estrogens (PREMARIN) vaginal cream Uses it externally      . fexofenadine (ALLEGRA) 180 MG tablet Take 180 mg by mouth daily.      Marland Kitchen ketotifen (ZADITOR) 0.025 % ophthalmic solution Place 1 drop into both eyes daily.      Marland Kitchen levothyroxine (SYNTHROID, LEVOTHROID) 50 MCG tablet Take 1 tablet (50 mcg total) by mouth daily.  90 tablet  3  . Probiotic Product (PROBIOTIC DAILY) CAPS Take 1 capsule by mouth daily.        No Known Allergies  Past Medical History  Diagnosis Date  . Allergic rhinitis due to pollen   . Allergic asthma   . GERD (gastroesophageal reflux disease)   . IBS (irritable bowel syndrome)   . Unspecified hypothyroidism   . Osteopenia     Past Surgical History  Procedure Date  . Cholecystectomy   . Tubal ligation   . Hemorrhoid surgery 1953 or so    with fissure repair  . Basal cell carcinoma excision 2000's    nose    Family History  Problem Relation Age of Onset  . COPD Mother   . Cancer Mother     unclear diagnosis  . Stroke Mother   . Diabetes Sister   . Hypertension Sister   . Cancer Paternal Grandmother     ?breast  . Heart disease Neg Hx   . Stroke Other     History   Social History  . Marital Status: Divorced    Spouse Name: N/A    Number of Children: 1  . Years of Education: N/A   Occupational  History  . Retired historian--consultant    Social History Main Topics  . Smoking status: Never Smoker   . Smokeless tobacco: Never Used  . Alcohol Use: Yes     Comment: rare wine  . Drug Use: No  . Sexually Active: Not on file   Other Topics Concern  . Not on file   Social History Narrative   ! [place] in [place] etc in [place] has health care POA.Would accept resuscitation attempts--not sure about DNR at this pointWould not want prolonged mechanical ventilationMay accept tube feeds temporarily but not if cognitively unaware     Review of Systems Appetite is fine No nausea    Objective:   Physical Exam  Constitutional: She appears well-developed and well-nourished. No distress.  Abdominal: Soft. She exhibits no distension. There is no tenderness. There is no rebound and no guarding.          Assessment & Plan:

## 2012-06-01 NOTE — Assessment & Plan Note (Signed)
Dark Doesn't think this time was dehydration Discussed lack of signs of infection and micro only showed 3-4 RBC Will just observe

## 2012-06-11 ENCOUNTER — Encounter: Payer: Self-pay | Admitting: Internal Medicine

## 2012-06-19 ENCOUNTER — Ambulatory Visit (INDEPENDENT_AMBULATORY_CARE_PROVIDER_SITE_OTHER): Payer: Medicare Other | Admitting: Internal Medicine

## 2012-06-19 ENCOUNTER — Encounter: Payer: Self-pay | Admitting: Internal Medicine

## 2012-06-19 VITALS — BP 100/60 | HR 69 | Temp 97.3°F | Wt 132.0 lb

## 2012-06-19 DIAGNOSIS — Z7189 Other specified counseling: Secondary | ICD-10-CM | POA: Diagnosis not present

## 2012-06-19 NOTE — Assessment & Plan Note (Signed)
All of 15 minutes visit in counseling DNR order written

## 2012-06-19 NOTE — Progress Notes (Signed)
  Subjective:    Patient ID: Ariel Phillips, female    DOB: [birth year], 79 y.o.   MRN: [id number]  HPI Wants to review advanced directives Has some questions  Reviewed DNR She understands and wishes this--order written Very concerned about pain---wants aggressive palliation (afraid of pain)  Discussed MOST form Will hold off on this  Current Outpatient Prescriptions on File Prior to Visit  Medication Sig Dispense Refill  . azelastine (ASTELIN) 137 MCG/SPRAY nasal spray Place 1 spray into the nose daily.       . Calcium Carbonate (CALCIUM 600 PO) Take 2 tablets by mouth daily.      Marland Kitchen conjugated estrogens (PREMARIN) vaginal cream Uses it externally      . fexofenadine (ALLEGRA) 180 MG tablet Take 180 mg by mouth daily.      Marland Kitchen ketotifen (ZADITOR) 0.025 % ophthalmic solution Place 1 drop into both eyes daily.      Marland Kitchen levothyroxine (SYNTHROID, LEVOTHROID) 50 MCG tablet Take 1 tablet (50 mcg total) by mouth daily.  90 tablet  3  . Probiotic Product (PROBIOTIC DAILY) CAPS Take 1 capsule by mouth daily.       No current facility-administered medications on file prior to visit.    No Known Allergies  Past Medical History  Diagnosis Date  . Allergic rhinitis due to pollen   . Allergic asthma   . GERD (gastroesophageal reflux disease)   . IBS (irritable bowel syndrome)   . Unspecified hypothyroidism   . Osteopenia     Past Surgical History  Procedure Laterality Date  . Cholecystectomy    . Tubal ligation    . Hemorrhoid surgery  1953 or so    with fissure repair  . Basal cell carcinoma excision  2000's    nose    Family History  Problem Relation Age of Onset  . COPD Mother   . Cancer Mother     unclear diagnosis  . Stroke Mother   . Diabetes Sister   . Hypertension Sister   . Cancer Paternal Grandmother     ?breast  . Heart disease Neg Hx   . Stroke Other     History   Social History  . Marital Status: Divorced    Spouse Name: N/A    Number of Children: 1  .  Years of Education: N/A   Occupational History  . Retired historian--consultant    Social History Main Topics  . Smoking status: Never Smoker   . Smokeless tobacco: Never Used  . Alcohol Use: Yes     Comment: rare wine  . Drug Use: No  . Sexually Active: Not on file   Other Topics Concern  . Not on file   Social History Narrative   1 Daughter in [place]   Has living will   Daughter has health care POA.---[name]   Requests DNR--order done 06/19/12   Would not want prolonged mechanical ventilation   May accept tube feeds temporarily but not if cognitively unaware   Review of Systems Concerned about some asymmetry in face May be long standing    Objective:   Physical Exam  Constitutional: She appears well-developed and well-nourished. No distress.  Psychiatric: She has a normal mood and affect. Her behavior is normal. Thought content normal.          Assessment & Plan:

## 2012-06-29 ENCOUNTER — Other Ambulatory Visit: Payer: Self-pay | Admitting: Internal Medicine

## 2012-07-17 ENCOUNTER — Ambulatory Visit (INDEPENDENT_AMBULATORY_CARE_PROVIDER_SITE_OTHER): Payer: Medicare Other | Admitting: Family Medicine

## 2012-07-17 ENCOUNTER — Encounter: Payer: Self-pay | Admitting: Family Medicine

## 2012-07-17 VITALS — BP 104/72 | HR 66 | Temp 97.6°F

## 2012-07-17 DIAGNOSIS — T148XXA Other injury of unspecified body region, initial encounter: Secondary | ICD-10-CM | POA: Diagnosis not present

## 2012-07-17 DIAGNOSIS — W5501XA Bitten by cat, initial encounter: Secondary | ICD-10-CM | POA: Insufficient documentation

## 2012-07-17 MED ORDER — SULFAMETHOXAZOLE-TRIMETHOPRIM 800-160 MG PO TABS: 1.0000 | ORAL_TABLET | Freq: Two times a day (BID) | ORAL | Status: DC

## 2012-07-17 NOTE — Progress Notes (Signed)
Bitten by her cat yesterday on her L calf.  Cat is indoors and has been vaccinated for rabies.  Calf isn't sore. She washed the areas. Tetanus shot 2012.    Meds, vitals, and allergies reviewed.   ROS: See HPI.  Otherwise, noncontributory.  nad L lower leg with 1 horizontal scratch on the front and back noted.  Also with to small round epithelial disruptions c/w shallow/superficial punctures.  No spreading erythema or fluctuance Distally nv intact.

## 2012-07-17 NOTE — Assessment & Plan Note (Signed)
Cat is vaccinated, td up to date for patient.  She cleaned the wounds.  Doesn't appear infected.   Would use septra for proph abx as she has sig h/o diarrhea and GI intolerance to augmentin.   She agrees, should do well.  Will call back/follow up prn.

## 2012-07-17 NOTE — Patient Instructions (Addendum)
Keep the area clean and covered.  Start the septra today and notify us of any spreading redness, pain or fever.  Take care.

## 2012-07-18 ENCOUNTER — Encounter: Payer: Self-pay | Admitting: Internal Medicine

## 2012-07-19 DIAGNOSIS — M79609 Pain in unspecified limb: Secondary | ICD-10-CM | POA: Diagnosis not present

## 2012-07-19 DIAGNOSIS — B351 Tinea unguium: Secondary | ICD-10-CM | POA: Diagnosis not present

## 2012-09-20 ENCOUNTER — Encounter: Payer: Self-pay | Admitting: Internal Medicine

## 2012-09-20 ENCOUNTER — Emergency Department: Payer: Self-pay | Admitting: Emergency Medicine

## 2012-09-20 ENCOUNTER — Ambulatory Visit (INDEPENDENT_AMBULATORY_CARE_PROVIDER_SITE_OTHER): Payer: Medicare Other | Admitting: Internal Medicine

## 2012-09-20 VITALS — BP 106/68 | HR 76 | Temp 97.9°F | Wt 132.8 lb

## 2012-09-20 DIAGNOSIS — Z5189 Encounter for other specified aftercare: Secondary | ICD-10-CM

## 2012-09-20 DIAGNOSIS — S93609A Unspecified sprain of unspecified foot, initial encounter: Secondary | ICD-10-CM | POA: Diagnosis not present

## 2012-09-20 DIAGNOSIS — R6889 Other general symptoms and signs: Secondary | ICD-10-CM | POA: Diagnosis not present

## 2012-09-20 DIAGNOSIS — Z79899 Other long term (current) drug therapy: Secondary | ICD-10-CM | POA: Diagnosis not present

## 2012-09-20 DIAGNOSIS — M25579 Pain in unspecified ankle and joints of unspecified foot: Secondary | ICD-10-CM | POA: Diagnosis not present

## 2012-09-20 DIAGNOSIS — S93601D Unspecified sprain of right foot, subsequent encounter: Secondary | ICD-10-CM

## 2012-09-20 DIAGNOSIS — K589 Irritable bowel syndrome without diarrhea: Secondary | ICD-10-CM | POA: Diagnosis not present

## 2012-09-20 DIAGNOSIS — S93601A Unspecified sprain of right foot, initial encounter: Secondary | ICD-10-CM | POA: Insufficient documentation

## 2012-09-20 DIAGNOSIS — K219 Gastro-esophageal reflux disease without esophagitis: Secondary | ICD-10-CM | POA: Diagnosis not present

## 2012-09-20 DIAGNOSIS — M79609 Pain in unspecified limb: Secondary | ICD-10-CM | POA: Diagnosis not present

## 2012-09-20 NOTE — Progress Notes (Signed)
Subjective:    Patient ID: Ariel Phillips, female    DOB: 1932/06/06, 79 y.o.   MRN: [id number]  HPI Lying on the floor meditating before med--her usual yesterday Got up and slipped sideways Right foot twisted under her---heard crack Pulled cord and 911 notified Brought to [place] X-rays were negative for fracture Diagnosed with sprain of foot  Has splint on foot Using 1 crutch only---couldn't do it with 2  Very little pain-- even when walking Does have some pain when flexing foot  Did swell a lot Iced reguarly Ibuprofen   Current Outpatient Prescriptions on File Prior to Visit  Medication Sig Dispense Refill  . azelastine (ASTELIN) 137 MCG/SPRAY nasal spray SPRAY 1 SPRAY INTO BOTH NOSTRILS TWICE A DAY  90 mL  0  . conjugated estrogens (PREMARIN) vaginal cream Uses it externally      . fexofenadine (ALLEGRA) 180 MG tablet Take 180 mg by mouth daily.      Marland Kitchen ketotifen (ZADITOR) 0.025 % ophthalmic solution Place 1 drop into both eyes daily.      Marland Kitchen levothyroxine (SYNTHROID, LEVOTHROID) 50 MCG tablet Take 1 tablet (50 mcg total) by mouth daily.  90 tablet  3  . Probiotic Product (PROBIOTIC DAILY) CAPS Take 1 capsule by mouth daily.      Marland Kitchen aspirin EC 81 MG tablet Take 81 mg by mouth every other day.      . Calcium Carbonate (CALCIUM 600 PO) Take 2 tablets by mouth daily.       No current facility-administered medications on file prior to visit.    No Known Allergies  Past Medical History  Diagnosis Date  . Allergic rhinitis due to pollen   . Allergic asthma   . GERD (gastroesophageal reflux disease)   . IBS (irritable bowel syndrome)   . Unspecified hypothyroidism   . Osteopenia     Past Surgical History  Procedure Laterality Date  . Cholecystectomy    . Tubal ligation    . Hemorrhoid surgery  1953 or so    with fissure repair  . Basal cell carcinoma excision  2000's    nose    Family History  Problem Relation Age of Onset  . COPD Mother   . Cancer Mother    unclear diagnosis  . Stroke Mother   . Diabetes Sister   . Hypertension Sister   . Cancer Paternal Grandmother     ?breast  . Heart disease Neg Hx   . Stroke Other     History   Social History  . Marital Status: Divorced    Spouse Name: N/A    Number of Children: 1  . Years of Education: N/A   Occupational History  . Retired historian--consultant    Social History Main Topics  . Smoking status: Never Smoker   . Smokeless tobacco: Never Used  . Alcohol Use: Yes     Comment: rare wine  . Drug Use: No  . Sexually Active: Not on file   Other Topics Concern  . Not on file   Social History Narrative   1 Daughter in [place]   Has living will   Daughter has health care POA.---[name]   Requests DNR--order done 06/19/12   Would not want prolonged mechanical ventilation   May accept tube feeds temporarily but not if cognitively unaware   Review of Systems Appetite is fine No fever    Objective:   Physical Exam  Constitutional: She appears well-developed and well-nourished. No distress.  Musculoskeletal:  Mild  swelling along lateral right foot near base of 5th metatarsal Some contusion between right 3rd and 4th toes Mild tenderness laterally along mid lateral foot and base of lateral toes          Assessment & Plan:

## 2012-09-20 NOTE — Assessment & Plan Note (Signed)
Not ankle Ankle brace removed Able to walk with full weight bearing-- gingerly  Will continue elevation and ice Using crutch to keep partial weight bearing ibuprofen

## 2012-09-26 ENCOUNTER — Telehealth: Payer: Self-pay

## 2012-09-26 NOTE — Telephone Encounter (Signed)
Pt seen 09/20/12 with sprain of rt foot. Pt said swelling is no better than when seen; new area of swelling below rt ankle with bruising which was noticed on 09/24/12. No pain unless pt pushes on foot with fingers and then pain level is 2. After elevates foot during the night swelling is better in the AM except this morning swelling did not improve from last night. Pt is continuing to wear ace bandage when up on foot, using crutch and elevating with ice. Pt wants to know if need f/u appt or what does Dr Bennie Pierini suggest.Please advise.

## 2012-09-27 NOTE — Telephone Encounter (Signed)
I am not surprised that she is still having problems---- a bad foot sprain can take many weeks to heal. If she is not any better by next week, have her set up appt for Dr Patsy Lager to evaluate

## 2012-09-27 NOTE — Telephone Encounter (Signed)
Spoke with patient and advised results   

## 2012-10-03 ENCOUNTER — Ambulatory Visit (INDEPENDENT_AMBULATORY_CARE_PROVIDER_SITE_OTHER)
Admission: RE | Admit: 2012-10-03 | Discharge: 2012-10-03 | Disposition: A | Discharge status: Home / Self Care | Payer: Medicare Other | Source: Ambulatory Visit | Attending: Family Medicine | Admitting: Family Medicine

## 2012-10-03 ENCOUNTER — Encounter: Payer: Self-pay | Admitting: Family Medicine

## 2012-10-03 ENCOUNTER — Ambulatory Visit (INDEPENDENT_AMBULATORY_CARE_PROVIDER_SITE_OTHER): Payer: Medicare Other | Admitting: Family Medicine

## 2012-10-03 VITALS — BP 110/72 | HR 70 | Temp 97.5°F | Ht 64.0 in | Wt 133.8 lb

## 2012-10-03 DIAGNOSIS — M25571 Pain in right ankle and joints of right foot: Secondary | ICD-10-CM

## 2012-10-03 DIAGNOSIS — M79609 Pain in unspecified limb: Secondary | ICD-10-CM

## 2012-10-03 DIAGNOSIS — M79671 Pain in right foot: Secondary | ICD-10-CM

## 2012-10-03 DIAGNOSIS — M7730 Calcaneal spur, unspecified foot: Secondary | ICD-10-CM | POA: Diagnosis not present

## 2012-10-03 DIAGNOSIS — M25579 Pain in unspecified ankle and joints of unspecified foot: Secondary | ICD-10-CM | POA: Diagnosis not present

## 2012-10-03 NOTE — Progress Notes (Signed)
Nature conservation officer at [facility] [address] [place] [address] Phone: [phone] Fax: [phone]  Date:  10/03/2012   Name:  Ariel Phillips   DOB:  [birth year]   MRN:  [id number] Gender: female Age: 79 y.o.  Primary Physician:  Tillman Abide, MD  Evaluating MD: Hannah Beat, MD   Chief Complaint: Foot Pain   History of Present Illness:  Ariel Phillips is a 79 y.o. pleasant patient who presents with the following:  R ankle pain:  2 weeks ago, tipped sideways, heard something crack. Went to ER at [facility] Told in ER to stay off of it. Did not think could manage it. Did not really hurt at all. Has been using 1 crutch. Still feels very bad. Much swelling and bruising.  Across metatarsals, a lot of swelling, pain.   She has been using an ACE bandage recently, and it sounds like they gave her and ASO or [place] type brace at the ER.  Initial Foot and Ankle series from [facility] reviewed. No evidence of occult fx. Calcaneal spur, mild degenerative changes in the midfoot.  Patient Active Problem List   Diagnosis Date Noted  . Sprain of right foot 09/20/2012  . Hypothyroidism 12/12/2011  . Allergic rhinitis due to pollen   . Allergic asthma   . GERD (gastroesophageal reflux disease)   . IBS (irritable bowel syndrome)   . Osteopenia     Past Medical History  Diagnosis Date  . Allergic rhinitis due to pollen   . Allergic asthma   . GERD (gastroesophageal reflux disease)   . IBS (irritable bowel syndrome)   . Unspecified hypothyroidism   . Osteopenia     Past Surgical History  Procedure Laterality Date  . Cholecystectomy    . Tubal ligation    . Hemorrhoid surgery  1953 or so    with fissure repair  . Basal cell carcinoma excision  2000's    nose    History   Social History  . Marital Status: Divorced    Spouse Name: N/A    Number of Children: 1  . Years of Education: N/A   Occupational History  . Retired historian--consultant    Social History  Main Topics  . Smoking status: Never Smoker   . Smokeless tobacco: Never Used  . Alcohol Use: Yes     Comment: rare wine  . Drug Use: No  . Sexually Active: Not on file   Other Topics Concern  . Not on file   Social History Narrative   1 Daughter in [place]   Has living will   Daughter has health care POA.---[name]   Requests DNR--order done 06/19/12   Would not want prolonged mechanical ventilation   May accept tube feeds temporarily but not if cognitively unaware    Family History  Problem Relation Age of Onset  . COPD Mother   . Cancer Mother     unclear diagnosis  . Stroke Mother   . Diabetes Sister   . Hypertension Sister   . Cancer Paternal Grandmother     ?breast  . Heart disease Neg Hx   . Stroke Other     No Known Allergies  Medication list has been reviewed and updated.  Outpatient Prescriptions Prior to Visit  Medication Sig Dispense Refill  . azelastine (ASTELIN) 137 MCG/SPRAY nasal spray SPRAY 1 SPRAY INTO BOTH NOSTRILS TWICE A DAY  90 mL  0  . conjugated estrogens (PREMARIN) vaginal cream Uses it externally      .  fexofenadine (ALLEGRA) 180 MG tablet Take 180 mg by mouth daily.      Marland Kitchen ketotifen (ZADITOR) 0.025 % ophthalmic solution Place 1 drop into both eyes daily.      Marland Kitchen levothyroxine (SYNTHROID, LEVOTHROID) 50 MCG tablet Take 1 tablet (50 mcg total) by mouth daily.  90 tablet  3  . Probiotic Product (PROBIOTIC DAILY) CAPS Take 1 capsule by mouth daily.      Marland Kitchen aspirin EC 81 MG tablet Take 81 mg by mouth every other day.      . Calcium Carbonate (CALCIUM 600 PO) Take 2 tablets by mouth daily.       No facility-administered medications prior to visit.    Review of Systems:   GEN: No fevers, chills. Nontoxic. Primarily MSK c/o today. MSK: Detailed in the HPI GI: tolerating PO intake without difficulty Neuro: No numbness, parasthesias, or tingling associated. Otherwise the pertinent positives of the ROS are noted above.    Physical  Examination: BP 110/72  Pulse 70  Temp(Src) 97.5 F (36.4 C) (Oral)  Ht 5\' 4"  (1.626 m)  Wt 133 lb 12 oz (60.669 kg)  BMI 22.95 kg/m2  SpO2 97%  Ideal Body Weight: Weight in (lb) to have BMI = 25: 145.3   GEN: WDWN, NAD, Non-toxic, Alert & Oriented x 3 HEENT: Atraumatic, Normocephalic.  Ears and Nose: No external deformity. EXTR: No clubbing/cyanosis/edema NEURO: significant antalgia  PSYCH: Normally interactive. Conversant. Not depressed or anxious appearing.  Calm demeanor.   R foot and ankle: Entire forefoot swollen. Lateral ankle swelling. Bruising at lateral ankle, forefoot, and toes. NT toes. 1st MT NT. 2nd-4th MT heads and distal shafts are notably tender and very swollen in the soft tissue.  Navicular NT, Cuboid NT, 5th MT base nt. Medial mall NT. Lateral mall TTP.   ATFL and CFL markedly tender to palpation and with drawer testing.  Dg Ankle Complete Right  10/03/2012   *RADIOLOGY REPORT*  Clinical Data: Right ankle pain.  RIGHT ANKLE - COMPLETE 3+ VIEW  Comparison: None.  Findings: The ankle mortise is maintained.  No acute ankle fracture or osteochondral abnormality.  Small calcaneal spurs are noted.  IMPRESSION: No acute bony findings.   Original Report Authenticated By: Rudie Meyer, M.D.   Dg Foot Complete Right  10/03/2012   *RADIOLOGY REPORT*  Clinical Data: Right foot pain.  RIGHT FOOT COMPLETE - 3+ VIEW  Comparison: None  Findings: Mild degenerative changes at the first metatarsal phalangeal joint and mild hallux valgus deformity.  No acute fracture.  Pes cavus deformity noted along with small calcaneal spurs.  IMPRESSION:  1.  No acute bony findings. 2.  Pes cavus. 3.  Small calcaneal heel spurs.   Original Report Authenticated By: Rudie Meyer, M.D.    Assessment and Plan:  Pain in joint, ankle and foot, right - Plan: DG Ankle Complete Right  Foot pain, right - Plan: DG Foot Complete Right  >25 minutes spent in face to face time with patient, >50% spent in  counselling or coordination of care: films reviewed with patient, anatomy reviewed. Grade 3 ATFL and CFL sprain High severity bone contusion vs. Some level of stress fracture equivalent in 2nd-4th MT   Immobilize in pneumatic full length CAM boot and recheck in 1 mo. 1 crutch ok for now. No driving in boot.  Orders Today:  Orders Placed This Encounter  Procedures  . DG Ankle Complete Right    Standing Status: Future     Number of Occurrences: 1  Standing Expiration Date: 12/03/2013    Order Specific Question:  Preferred imaging location?    Answer:  [facility]    Order Specific Question:  Reason for exam:    Answer:  trauma, 2 weeks ago  . DG Foot Complete Right    Standing Status: Future     Number of Occurrences: 1     Standing Expiration Date: 12/03/2013    Order Specific Question:  Preferred imaging location?    Answer:  [facility]    Order Specific Question:  Reason for exam:    Answer:  trauma 2 weeks    Updated Medication List: (Includes new medications, updates to list, dose adjustments) No orders of the defined types were placed in this encounter.    Medications Discontinued: Medications Discontinued During This Encounter  Medication Reason  . aspirin EC 81 MG tablet Error  . Calcium Carbonate (CALCIUM 600 PO) Error      Signed, Dariyah Garduno T. Dorrie Cocuzza, MD 10/03/2012 12:39 PM

## 2012-10-03 NOTE — Patient Instructions (Addendum)
F/u 1 month 

## 2012-11-05 ENCOUNTER — Ambulatory Visit: Payer: Medicare Other | Admitting: Family Medicine

## 2012-11-06 ENCOUNTER — Ambulatory Visit (INDEPENDENT_AMBULATORY_CARE_PROVIDER_SITE_OTHER): Payer: Medicare Other | Admitting: Family Medicine

## 2012-11-06 ENCOUNTER — Encounter: Payer: Self-pay | Admitting: Family Medicine

## 2012-11-06 VITALS — BP 100/70 | HR 76 | Temp 97.4°F | Ht 64.0 in | Wt 134.8 lb

## 2012-11-06 DIAGNOSIS — Z5189 Encounter for other specified aftercare: Secondary | ICD-10-CM

## 2012-11-06 DIAGNOSIS — M79609 Pain in unspecified limb: Secondary | ICD-10-CM

## 2012-11-06 DIAGNOSIS — M79671 Pain in right foot: Secondary | ICD-10-CM

## 2012-11-06 DIAGNOSIS — S93601D Unspecified sprain of right foot, subsequent encounter: Secondary | ICD-10-CM

## 2012-11-06 NOTE — Progress Notes (Signed)
Nature conservation officer at [facility] [address] [place] [address] Phone: [phone] Fax: [phone]  Date:  11/06/2012   Name:  Ariel Phillips   DOB:  08/22/1932   MRN:  [id number] Gender: female Age: 79 y.o.  Primary Physician:  Tillman Abide, MD  Evaluating MD: Hannah Beat, MD   Chief Complaint: Ankle Pain   History of Present Illness:  Ariel Phillips is a 79 y.o. pleasant patient who presents with the following:  F/u right ankle sprain:  Initial injury approaching 6 weeks ago. She saw me initially 4 weeks ago. At that time I felt like she had a severe CFL and ATFL ankle sprain as well as some forefoot injury. I placed her in a Cam Walker boot, and she has done much better. Pain is dramatically diminished. Forefoot pain is almost gone. She still has some CFL and ATFL pain. She also has some stiffness.  Patient Active Problem List   Diagnosis Date Noted  . Sprain of right foot 09/20/2012  . Hypothyroidism 12/12/2011  . Allergic rhinitis due to pollen   . Allergic asthma   . GERD (gastroesophageal reflux disease)   . IBS (irritable bowel syndrome)   . Osteopenia     Past Medical History  Diagnosis Date  . Allergic rhinitis due to pollen   . Allergic asthma   . GERD (gastroesophageal reflux disease)   . IBS (irritable bowel syndrome)   . Unspecified hypothyroidism   . Osteopenia     Past Surgical History  Procedure Laterality Date  . Cholecystectomy    . Tubal ligation    . Hemorrhoid surgery  1953 or so    with fissure repair  . Basal cell carcinoma excision  2000's    nose    History   Social History  . Marital Status: Divorced    Spouse Name: N/A    Number of Children: 1  . Years of Education: N/A   Occupational History  . Retired historian--consultant    Social History Main Topics  . Smoking status: Never Smoker   . Smokeless tobacco: Never Used  . Alcohol Use: Yes     Comment: rare wine  . Drug Use: No  . Sexually Active:  Not on file   Other Topics Concern  . Not on file   Social History Narrative   1 Daughter in [place]   Has living will   Daughter has health care POA.---[name]   Requests DNR--order done 06/19/12   Would not want prolonged mechanical ventilation   May accept tube feeds temporarily but not if cognitively unaware    Family History  Problem Relation Age of Onset  . COPD Mother   . Cancer Mother     unclear diagnosis  . Stroke Mother   . Diabetes Sister   . Hypertension Sister   . Cancer Paternal Grandmother     ?breast  . Heart disease Neg Hx   . Stroke Other     No Known Allergies  Medication list has been reviewed and updated.  Outpatient Prescriptions Prior to Visit  Medication Sig Dispense Refill  . azelastine (ASTELIN) 137 MCG/SPRAY nasal spray SPRAY 1 SPRAY INTO BOTH NOSTRILS TWICE A DAY  90 mL  0  . conjugated estrogens (PREMARIN) vaginal cream Uses it externally      . fexofenadine (ALLEGRA) 180 MG tablet Take 180 mg by mouth daily.      Marland Kitchen ketotifen (ZADITOR) 0.025 % ophthalmic solution Place 1 drop into both  eyes daily.      Marland Kitchen levothyroxine (SYNTHROID, LEVOTHROID) 50 MCG tablet Take 1 tablet (50 mcg total) by mouth daily.  90 tablet  3  . Probiotic Product (PROBIOTIC DAILY) CAPS Take 2 capsules by mouth daily.        No facility-administered medications prior to visit.    Review of Systems:   GEN: No fevers, chills. Nontoxic. Primarily MSK c/o today. MSK: Detailed in the HPI GI: tolerating PO intake without difficulty Neuro: No numbness, parasthesias, or tingling associated. Otherwise the pertinent positives of the ROS are noted above.    Physical Examination: BP 100/70  Pulse 76  Temp(Src) 97.4 F (36.3 C) (Oral)  Ht 5\' 4"  (1.626 m)  Wt 134 lb 12 oz (61.122 kg)  BMI 23.12 kg/m2  SpO2 95%  Ideal Body Weight: Weight in (lb) to have BMI = 25: 145.3   GEN: WDWN, NAD, Non-toxic, Alert & Oriented x 3 HEENT: Atraumatic, Normocephalic.  Ears and  Nose: No external deformity. EXTR: No clubbing/cyanosis/tr edema R ankle PSYCH: Normally interactive. Conversant. Not depressed or anxious appearing.  Calm demeanor.   Mild tenderness on the right second and third metatarsal shafts. Mild to moderate tenderness at the CFL ATFL ligaments on the right. Nontender on the malleoli. Nontender at the navicular or cuboid. Nontender at the base of the fifth metatarsal. Strength is preserved in all directions of the ankle. Some tenderness with movement at the first MTP joint.  Assessment and Plan:  Foot pain, right  Sprain of right foot, subsequent encounter  Continuing Cam Walker boot over the next 2 weeks, then wean down as tolerated over the next 7 days to 10 days. She is going to call me when she feels like she is doing a good bit better, and we did start physical therapy at [facility].  Start [facility] now  Orders Today:  No orders of the defined types were placed in this encounter.    Updated Medication List: (Includes new medications, updates to list, dose adjustments) Meds ordered this encounter  Medications  . aspirin 81 MG tablet    Sig: Take 81 mg by mouth every other day.    Medications Discontinued: There are no discontinued medications.    Signed, Elpidio Galea. Avacyn Kloosterman, MD 11/06/2012 11:46 AM

## 2012-11-08 ENCOUNTER — Ambulatory Visit: Payer: Medicare Other | Admitting: Family Medicine

## 2012-12-03 ENCOUNTER — Ambulatory Visit (INDEPENDENT_AMBULATORY_CARE_PROVIDER_SITE_OTHER): Payer: Medicare Other | Admitting: Family Medicine

## 2012-12-03 ENCOUNTER — Encounter: Payer: Self-pay | Admitting: Family Medicine

## 2012-12-03 VITALS — BP 104/60 | HR 64 | Temp 97.9°F | Ht 64.0 in | Wt 133.0 lb

## 2012-12-03 DIAGNOSIS — Z5189 Encounter for other specified aftercare: Secondary | ICD-10-CM | POA: Diagnosis not present

## 2012-12-03 DIAGNOSIS — S93601D Unspecified sprain of right foot, subsequent encounter: Secondary | ICD-10-CM

## 2012-12-03 DIAGNOSIS — M79671 Pain in right foot: Secondary | ICD-10-CM

## 2012-12-03 DIAGNOSIS — M79609 Pain in unspecified limb: Secondary | ICD-10-CM

## 2012-12-03 NOTE — Progress Notes (Signed)
Nature conservation officer at [facility] [address] [place] [address] Phone: [phone] Fax: [phone]  Date:  12/03/2012   Name:  Ariel Phillips   DOB:  19-Feb-1933   MRN:  [id number] Gender: female Age: 79 y.o.  Primary Physician:  Tillman Abide, MD  Evaluating MD: Hannah Beat, MD   Chief Complaint: Follow-up   History of Present Illness:  Ariel Phillips is a 79 y.o. pleasant patient who presents with the following:  79 yo, f/u R ankle:  S/p severe sprain and immobilized in CAM walker, with intermittent swelling and pain, more lateral and dorsal. Doing much better and out of her boot.  Patient Active Problem List   Diagnosis Date Noted  . Sprain of right foot 09/20/2012  . Hypothyroidism 12/12/2011  . Allergic rhinitis due to pollen   . Allergic asthma   . GERD (gastroesophageal reflux disease)   . IBS (irritable bowel syndrome)   . Osteopenia     Past Medical History  Diagnosis Date  . Allergic rhinitis due to pollen   . Allergic asthma   . GERD (gastroesophageal reflux disease)   . IBS (irritable bowel syndrome)   . Unspecified hypothyroidism   . Osteopenia     Past Surgical History  Procedure Laterality Date  . Cholecystectomy    . Tubal ligation    . Hemorrhoid surgery  1953 or so    with fissure repair  . Basal cell carcinoma excision  2000's    nose    History   Social History  . Marital Status: Divorced    Spouse Name: N/A    Number of Children: 1  . Years of Education: N/A   Occupational History  . Retired historian--consultant    Social History Main Topics  . Smoking status: Never Smoker   . Smokeless tobacco: Never Used  . Alcohol Use: Yes     Comment: rare wine  . Drug Use: No  . Sexually Active: Not on file   Other Topics Concern  . Not on file   Social History Narrative   1 Daughter in [place]   Has living will   Daughter has health care POA.---[name]   Requests DNR--order done 06/19/12   Would not want  prolonged mechanical ventilation   May accept tube feeds temporarily but not if cognitively unaware    Family History  Problem Relation Age of Onset  . COPD Mother   . Cancer Mother     unclear diagnosis  . Stroke Mother   . Diabetes Sister   . Hypertension Sister   . Cancer Paternal Grandmother     ?breast  . Heart disease Neg Hx   . Stroke Other     No Known Allergies  Medication list has been reviewed and updated.  Outpatient Prescriptions Prior to Visit  Medication Sig Dispense Refill  . aspirin 81 MG tablet Take 81 mg by mouth every other day.      Marland Kitchen azelastine (ASTELIN) 137 MCG/SPRAY nasal spray SPRAY 1 SPRAY INTO BOTH NOSTRILS TWICE A DAY  90 mL  0  . conjugated estrogens (PREMARIN) vaginal cream Uses it externally      . fexofenadine (ALLEGRA) 180 MG tablet Take 180 mg by mouth daily.      Marland Kitchen ketotifen (ZADITOR) 0.025 % ophthalmic solution Place 1 drop into both eyes daily.      Marland Kitchen levothyroxine (SYNTHROID, LEVOTHROID) 50 MCG tablet Take 1 tablet (50 mcg total) by mouth daily.  90 tablet  3  . Probiotic Product (PROBIOTIC DAILY) CAPS Take 2 capsules by mouth daily.        No facility-administered medications prior to visit.    Review of Systems:   GEN: No fevers, chills. Nontoxic. Primarily MSK c/o today. MSK: Detailed in the HPI GI: tolerating PO intake without difficulty Neuro: No numbness, parasthesias, or tingling associated. Otherwise the pertinent positives of the ROS are noted above.    Physical Examination: BP 104/60  Pulse 64  Temp(Src) 97.9 F (36.6 C) (Oral)  Ht 5\' 4"  (1.626 m)  Wt 133 lb (60.328 kg)  BMI 22.82 kg/m2  Ideal Body Weight: Weight in (lb) to have BMI = 25: 145.3   GEN: WDWN, NAD, Non-toxic, Alert & Oriented x 3 HEENT: Atraumatic, Normocephalic.  Ears and Nose: No external deformity. EXTR: No clubbing/cyanosis/edema NEURO: Normal gait.  PSYCH: Normally interactive. Conversant. Not depressed or anxious appearing.  Calm demeanor.    FEET: R Echymosis: no Edema: no ROM: full LE B Gait: heel toe, non-antalgic MT pain: no Callus pattern: none Lateral Mall: NT Medial Mall: NT Talus: NT Navicular: NT Cuboid: NT Calcaneous: NT Metatarsals: NT 5th MT: NT Phalanges: NT Achilles: NT Plantar Fascia: NT Fat Pad: NT Peroneals: NT Post Tib: NT Great Toe: Nml motion Ant Drawer: neg ATFL: mild pain CFL: NT Deltoid: NT Sensation: intact   Assessment and Plan:  Sprain of right foot, subsequent encounter - Plan: Ambulatory referral to Physical Therapy  Foot pain, right - Plan: Ambulatory referral to Physical Therapy  Resolving ankle sprain doing well. Involve PT  Orders Today:  Orders Placed This Encounter  Procedures  . Ambulatory referral to Physical Therapy    Referral Priority:  Routine    Referral Type:  Physical Medicine    Referral Reason:  Specialty Services Required    Requested Specialty:  Physical Therapy    Number of Visits Requested:  1    Updated Medication List: (Includes new medications, updates to list, dose adjustments) No orders of the defined types were placed in this encounter.    Medications Discontinued: There are no discontinued medications.    Signed, Elpidio Galea. Kadia Abaya, MD 12/03/2012 9:16 AM

## 2012-12-03 NOTE — Patient Instructions (Addendum)
REFERRAL: GO THE THE FRONT ROOM AT THE ENTRANCE OF OUR CLINIC, NEAR CHECK IN. ASK FOR Ariel Phillips. SHE WILL HELP YOU SET UP YOUR REFERRAL. DATE: TIME:  

## 2012-12-04 ENCOUNTER — Encounter: Payer: Self-pay | Admitting: Family Medicine

## 2012-12-05 DIAGNOSIS — M6281 Muscle weakness (generalized): Secondary | ICD-10-CM | POA: Diagnosis not present

## 2012-12-05 DIAGNOSIS — R262 Difficulty in walking, not elsewhere classified: Secondary | ICD-10-CM | POA: Diagnosis not present

## 2012-12-05 DIAGNOSIS — M79609 Pain in unspecified limb: Secondary | ICD-10-CM | POA: Diagnosis not present

## 2012-12-06 DIAGNOSIS — R262 Difficulty in walking, not elsewhere classified: Secondary | ICD-10-CM | POA: Diagnosis not present

## 2012-12-06 DIAGNOSIS — M6281 Muscle weakness (generalized): Secondary | ICD-10-CM | POA: Diagnosis not present

## 2012-12-06 DIAGNOSIS — M79609 Pain in unspecified limb: Secondary | ICD-10-CM | POA: Diagnosis not present

## 2012-12-07 DIAGNOSIS — M6281 Muscle weakness (generalized): Secondary | ICD-10-CM | POA: Diagnosis not present

## 2012-12-07 DIAGNOSIS — M79609 Pain in unspecified limb: Secondary | ICD-10-CM | POA: Diagnosis not present

## 2012-12-07 DIAGNOSIS — R262 Difficulty in walking, not elsewhere classified: Secondary | ICD-10-CM | POA: Diagnosis not present

## 2012-12-10 DIAGNOSIS — M79609 Pain in unspecified limb: Secondary | ICD-10-CM | POA: Diagnosis not present

## 2012-12-10 DIAGNOSIS — R262 Difficulty in walking, not elsewhere classified: Secondary | ICD-10-CM | POA: Diagnosis not present

## 2012-12-10 DIAGNOSIS — M6281 Muscle weakness (generalized): Secondary | ICD-10-CM | POA: Diagnosis not present

## 2012-12-11 DIAGNOSIS — R262 Difficulty in walking, not elsewhere classified: Secondary | ICD-10-CM | POA: Diagnosis not present

## 2012-12-11 DIAGNOSIS — M6281 Muscle weakness (generalized): Secondary | ICD-10-CM | POA: Diagnosis not present

## 2012-12-11 DIAGNOSIS — M79609 Pain in unspecified limb: Secondary | ICD-10-CM | POA: Diagnosis not present

## 2012-12-13 ENCOUNTER — Encounter: Payer: Self-pay | Admitting: Internal Medicine

## 2012-12-13 ENCOUNTER — Ambulatory Visit (INDEPENDENT_AMBULATORY_CARE_PROVIDER_SITE_OTHER): Payer: Medicare Other | Admitting: Internal Medicine

## 2012-12-13 VITALS — BP 110/70 | HR 66 | Temp 97.6°F | Ht 64.0 in | Wt 133.0 lb

## 2012-12-13 DIAGNOSIS — K219 Gastro-esophageal reflux disease without esophagitis: Secondary | ICD-10-CM | POA: Diagnosis not present

## 2012-12-13 DIAGNOSIS — K589 Irritable bowel syndrome without diarrhea: Secondary | ICD-10-CM

## 2012-12-13 DIAGNOSIS — J301 Allergic rhinitis due to pollen: Secondary | ICD-10-CM

## 2012-12-13 DIAGNOSIS — Z0000 Encounter for general adult medical examination without abnormal findings: Secondary | ICD-10-CM | POA: Diagnosis not present

## 2012-12-13 DIAGNOSIS — R262 Difficulty in walking, not elsewhere classified: Secondary | ICD-10-CM | POA: Diagnosis not present

## 2012-12-13 DIAGNOSIS — M79609 Pain in unspecified limb: Secondary | ICD-10-CM | POA: Diagnosis not present

## 2012-12-13 DIAGNOSIS — J45909 Unspecified asthma, uncomplicated: Secondary | ICD-10-CM

## 2012-12-13 DIAGNOSIS — E039 Hypothyroidism, unspecified: Secondary | ICD-10-CM

## 2012-12-13 DIAGNOSIS — M6281 Muscle weakness (generalized): Secondary | ICD-10-CM | POA: Diagnosis not present

## 2012-12-13 NOTE — Progress Notes (Signed)
Subjective:    Patient ID: Ariel Phillips, female    DOB: 26-Jul-1932, 79 y.o.   MRN: [id number]  HPI Here for Medicare Wellness visit and follow up Doesn't really see other doctors Recovering from bad ankle sprain (from fall). Just starting to do her walking again No sig depression and no anhedonia Rare alcohol. No tobacco Blind in left eye Hearing okay with aides Not sexually active Independent in all instrumental ADLs Has occasional "memory black holes"---like forgot that daughter had already thanked her from a gift during a visit (goes back many years). Did have all sorts of testing including EEG, CT in the past (?2005)  Ongoing allergy symptoms Satisfied with her current meds Some symptoms in spring No wheezing or SOB--no active problems with asthma (did use serevent in past). Has albuterol for prn but hasn't used  GERD has been quiet Having a little coffee again---but still hasn't needed meds  Some increased IBS symptoms May have weekly spells of cramping/spasms/diarrhea Discussed considering fiber supplements---doesn't want any other meds  Some softening in nails Hair is stable Some heat intolerance ---no change from her usual  Current Outpatient Prescriptions on File Prior to Visit  Medication Sig Dispense Refill  . aspirin 81 MG tablet Take 81 mg by mouth every other day.      Marland Kitchen azelastine (ASTELIN) 137 MCG/SPRAY nasal spray SPRAY 1 SPRAY INTO BOTH NOSTRILS TWICE A DAY  90 mL  0  . conjugated estrogens (PREMARIN) vaginal cream Uses it externally      . fexofenadine (ALLEGRA) 180 MG tablet Take 180 mg by mouth daily.      Marland Kitchen ketotifen (ZADITOR) 0.025 % ophthalmic solution Place 1 drop into both eyes daily.      Marland Kitchen levothyroxine (SYNTHROID, LEVOTHROID) 50 MCG tablet Take 1 tablet (50 mcg total) by mouth daily.  90 tablet  3  . Probiotic Product (PROBIOTIC DAILY) CAPS Take 2 capsules by mouth daily.        No current facility-administered medications on file prior to  visit.    No Known Allergies  Past Medical History  Diagnosis Date  . Allergic rhinitis due to pollen   . Allergic asthma   . GERD (gastroesophageal reflux disease)   . IBS (irritable bowel syndrome)   . Unspecified hypothyroidism   . Osteopenia     Past Surgical History  Procedure Laterality Date  . Cholecystectomy    . Tubal ligation    . Hemorrhoid surgery  1953 or so    with fissure repair  . Basal cell carcinoma excision  2000's    nose    Family History  Problem Relation Age of Onset  . COPD Mother   . Cancer Mother     unclear diagnosis  . Stroke Mother   . Diabetes Sister   . Hypertension Sister   . Cancer Paternal Grandmother     ?breast  . Heart disease Neg Hx   . Stroke Other     History   Social History  . Marital Status: Divorced    Spouse Name: N/A    Number of Children: 1  . Years of Education: N/A   Occupational History  . Retired historian--consultant    Social History Main Topics  . Smoking status: Never Smoker   . Smokeless tobacco: Never Used  . Alcohol Use: Yes     Comment: rare wine  . Drug Use: No  . Sexual Activity: Not on file   Other Topics Concern  . Not on  file   Social History Narrative   1 Daughter in [employer]   Has living will   Daughter has health care POA.---Ariel Phillips   Requests DNR--order done 06/19/12   Would not want prolonged mechanical ventilation   May accept tube feeds temporarily but not if cognitively unaware   Review of Systems Weight is stable Appetite is okay Sleeps okay    Objective:   Physical Exam  Constitutional: She is oriented to person, place, and time. She appears well-developed and well-nourished. No distress.  HENT:  Mouth/Throat: Oropharynx is clear and moist. No oropharyngeal exudate.  Neck: Normal range of motion. Neck supple. No thyromegaly present.  Cardiovascular: Normal rate, regular rhythm, normal heart sounds and intact distal pulses.  Exam reveals no gallop.   No murmur  heard. Pulmonary/Chest: Effort normal and breath sounds normal. No respiratory distress. She has no wheezes. She has no rales.  Abdominal: Soft. There is no tenderness.  Musculoskeletal: She exhibits no edema and no tenderness.  Lymphadenopathy:    She has no cervical adenopathy.  Neurological: She is alert and oriented to person, place, and time.  President-- "[address] [place], [place]" [phone] [id number] Recall 2/3  Skin: No rash noted.  Psychiatric: She has a normal mood and affect. Her behavior is normal.          Assessment & Plan:

## 2012-12-13 NOTE — Assessment & Plan Note (Signed)
I have personally reviewed the Medicare Annual Wellness questionnaire and have noted 1. The patient's medical and social history 2. Their use of alcohol, tobacco or illicit drugs 3. Their current medications and supplements 4. The patient's functional ability including ADL's, fall risks, home safety risks and hearing or visual             impairment. 5. Diet and physical activities 6. Evidence for depression or mood disorders  The patients weight, height, BMI and visual acuity have been recorded in the chart I have made referrals, counseling and provided education to the patient based review of the above and I have provided the pt with a written personalized care plan for preventive services.  I have provided you with a copy of your personalized plan for preventive services. Please take the time to review along with your updated medication list.  UTD on imms and cancer screening Counseling done

## 2012-12-13 NOTE — Assessment & Plan Note (Signed)
Has been quiet Would consider montelukast if flares

## 2012-12-13 NOTE — Assessment & Plan Note (Signed)
Quiet No meds lately

## 2012-12-13 NOTE — Assessment & Plan Note (Signed)
Seems to be euthyroid Due for labs 

## 2012-12-13 NOTE — Patient Instructions (Signed)
Please consider trying a fiber supplement like citrucel or metamucil daily.

## 2012-12-13 NOTE — Assessment & Plan Note (Signed)
Regular but tolerable symptoms Discussed fiber supplements

## 2012-12-13 NOTE — Assessment & Plan Note (Signed)
Satisfied with meds

## 2012-12-14 LAB — CBC WITH DIFFERENTIAL/PLATELET
Basophils Absolute: 0 10*3/uL (ref 0.0–0.1)
Basophils Relative: 0.5 % (ref 0.0–3.0)
Eosinophils Absolute: 0.1 10*3/uL (ref 0.0–0.7)
Lymphocytes Relative: 25.3 % (ref 12.0–46.0)
MCHC: 33.9 g/dL (ref 30.0–36.0)
Monocytes Relative: 8.2 % (ref 3.0–12.0)
Neutrophils Relative %: 63.9 % (ref 43.0–77.0)
RBC: 4.57 Mil/uL (ref 3.87–5.11)

## 2012-12-14 LAB — BASIC METABOLIC PANEL
CO2: 27 mEq/L (ref 19–32)
Calcium: 9 mg/dL (ref 8.4–10.5)
Creatinine, Ser: 0.6 mg/dL (ref 0.4–1.2)
GFR: 104.83 mL/min (ref >60.00)
Sodium: 137 mEq/L (ref 135–145)

## 2012-12-14 LAB — HEPATIC FUNCTION PANEL
ALT: 13 U/L (ref 0–35)
AST: 21 U/L (ref 0–37)
Albumin: 3.9 g/dL (ref 3.5–5.2)
Alkaline Phosphatase: 49 U/L (ref 39–117)
Total Protein: 6.7 g/dL (ref 6.0–8.3)

## 2012-12-14 LAB — T4, FREE: Free T4: 0.88 ng/dL (ref 0.60–1.60)

## 2012-12-17 DIAGNOSIS — M6281 Muscle weakness (generalized): Secondary | ICD-10-CM | POA: Diagnosis not present

## 2012-12-17 DIAGNOSIS — M79609 Pain in unspecified limb: Secondary | ICD-10-CM | POA: Diagnosis not present

## 2012-12-17 DIAGNOSIS — R262 Difficulty in walking, not elsewhere classified: Secondary | ICD-10-CM | POA: Diagnosis not present

## 2012-12-18 DIAGNOSIS — M6281 Muscle weakness (generalized): Secondary | ICD-10-CM | POA: Diagnosis not present

## 2012-12-18 DIAGNOSIS — M79609 Pain in unspecified limb: Secondary | ICD-10-CM | POA: Diagnosis not present

## 2012-12-18 DIAGNOSIS — R262 Difficulty in walking, not elsewhere classified: Secondary | ICD-10-CM | POA: Diagnosis not present

## 2012-12-20 DIAGNOSIS — M6281 Muscle weakness (generalized): Secondary | ICD-10-CM | POA: Diagnosis not present

## 2012-12-20 DIAGNOSIS — M79609 Pain in unspecified limb: Secondary | ICD-10-CM | POA: Diagnosis not present

## 2012-12-20 DIAGNOSIS — R262 Difficulty in walking, not elsewhere classified: Secondary | ICD-10-CM | POA: Diagnosis not present

## 2012-12-24 DIAGNOSIS — R262 Difficulty in walking, not elsewhere classified: Secondary | ICD-10-CM | POA: Diagnosis not present

## 2012-12-24 DIAGNOSIS — M79609 Pain in unspecified limb: Secondary | ICD-10-CM | POA: Diagnosis not present

## 2012-12-24 DIAGNOSIS — M6281 Muscle weakness (generalized): Secondary | ICD-10-CM | POA: Diagnosis not present

## 2012-12-25 DIAGNOSIS — R262 Difficulty in walking, not elsewhere classified: Secondary | ICD-10-CM | POA: Diagnosis not present

## 2012-12-25 DIAGNOSIS — M6281 Muscle weakness (generalized): Secondary | ICD-10-CM | POA: Diagnosis not present

## 2012-12-25 DIAGNOSIS — M79609 Pain in unspecified limb: Secondary | ICD-10-CM | POA: Diagnosis not present

## 2012-12-27 DIAGNOSIS — M79609 Pain in unspecified limb: Secondary | ICD-10-CM | POA: Diagnosis not present

## 2012-12-27 DIAGNOSIS — M6281 Muscle weakness (generalized): Secondary | ICD-10-CM | POA: Diagnosis not present

## 2012-12-27 DIAGNOSIS — R262 Difficulty in walking, not elsewhere classified: Secondary | ICD-10-CM | POA: Diagnosis not present

## 2012-12-28 ENCOUNTER — Other Ambulatory Visit: Payer: Self-pay

## 2012-12-28 MED ORDER — LEVOTHYROXINE SODIUM 50 MCG PO TABS: 50.0000 ug | ORAL_TABLET | Freq: Every day | ORAL | Status: DC

## 2012-12-28 NOTE — Telephone Encounter (Signed)
Pt request refill levothyroxine to [facility]. Pt notified done.

## 2013-01-11 DIAGNOSIS — M79609 Pain in unspecified limb: Secondary | ICD-10-CM | POA: Diagnosis not present

## 2013-01-11 DIAGNOSIS — M6281 Muscle weakness (generalized): Secondary | ICD-10-CM | POA: Diagnosis not present

## 2013-01-11 DIAGNOSIS — R262 Difficulty in walking, not elsewhere classified: Secondary | ICD-10-CM | POA: Diagnosis not present

## 2013-02-08 DIAGNOSIS — Z23 Encounter for immunization: Secondary | ICD-10-CM | POA: Diagnosis not present

## 2013-02-11 ENCOUNTER — Ambulatory Visit (INDEPENDENT_AMBULATORY_CARE_PROVIDER_SITE_OTHER): Payer: Medicare Other | Admitting: Family Medicine

## 2013-02-11 ENCOUNTER — Encounter: Payer: Self-pay | Admitting: Family Medicine

## 2013-02-11 VITALS — BP 132/82 | HR 76 | Temp 97.6°F | Wt 136.0 lb

## 2013-02-11 DIAGNOSIS — R29810 Facial weakness: Secondary | ICD-10-CM

## 2013-02-11 NOTE — Progress Notes (Addendum)
  Subjective:    Patient ID: Ariel Phillips, female    DOB: Oct 21, 1932, 79 y.o.   MRN: [id number]  HPI CC: facial asymmetry  Prior to moving here 2 yrs ago[place] and noticed L facial droop.  Told not a concern.  Intermittently noted facial drooping when exercising since but not otherwise.  Friday morning when awoke felt odd sensation on left side of face.  No pain or numbness.  Intermittent tingling.  Marked facial droop on left.  Not as noticeable now. Denies fevers/chills, slurred speech, confusion, other unilateral weakness. 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  No h/o shingles. Denies h/o bell's palsy. Has had zostavax. Has had flu shot.  No h/o HTN, HLD, DM. H/o hypothyroid on synthroid. She does have family history significant for strokes in multiple relatives. Lab Results  Component Value Date   TSH 5.30 12/13/2012  BP today higher than her normal. BP Readings from Last 3 Encounters:  02/11/13 132/82  12/13/12 110/70  12/03/12 104/60    Past Medical History  Diagnosis Date  . Allergic rhinitis due to pollen   . Allergic asthma   . GERD (gastroesophageal reflux disease)   . IBS (irritable bowel syndrome)   . Unspecified hypothyroidism   . Osteopenia     Family History  Problem Relation Age of Onset  . COPD Mother   . Cancer Mother     unclear diagnosis  . Stroke Mother   . Diabetes Sister   . Hypertension Sister   . Cancer Paternal Grandmother     ?breast  . Heart disease Neg Hx   . Stroke Other     Review of Systems Per HPI    Objective:   Physical Exam  Nursing note and vitals reviewed. Constitutional: She is oriented to person, place, and time. She appears well-developed and well-nourished. No distress.  HENT:  Head: Normocephalic.  Right Ear: Tympanic membrane, external ear and ear canal normal. Decreased hearing is noted.  Left Ear: Tympanic membrane, external ear and ear canal normal. Decreased hearing is noted.   Nose: Nose normal. No mucosal edema or rhinorrhea.  Mouth/Throat: Uvula is midline and oropharynx is clear and moist. No oropharyngeal exudate, posterior oropharyngeal edema, posterior oropharyngeal erythema or tonsillar abscesses.  Eyes: No scleral icterus.  R pupil WNL. L eye incomplete formation at baseline  Neck: Normal range of motion. Neck supple. Carotid bruit is not present.  Cardiovascular: Normal rate, regular rhythm, normal heart sounds and intact distal pulses.   No murmur heard. Pulmonary/Chest: Effort normal and breath sounds normal. No respiratory distress. She has no wheezes. She has no rales.  Lymphadenopathy:    She has no cervical adenopathy.  Neurological: She is alert and oriented to person, place, and time. She has normal strength and normal reflexes. No cranial nerve deficit or sensory deficit. She displays a negative Romberg sign.  CN 2-12 intact Normal FTN 5/5 strength BLE, BUE, bilateral grip strength There is evident left facial droop at lips and periorbita region when face is at rest but this does not persist with testing of cranial nerves.       Assessment & Plan:

## 2013-02-11 NOTE — Assessment & Plan Note (Signed)
Evident left facial droop at rest but not with activation of facial muscles. Reviewing prior office notes, no mention of asymmetry. fmhx stroke. I will obtain head CT to evaluate for recent stroke. If normal, ?residual from bell's palsy. Discussed plan with patient who agrees.

## 2013-02-11 NOTE — Patient Instructions (Signed)
I'd like to set you up with CT scan of head to evaluate this left sided facial weakness. If normal, this may be residual from prior bell's palsy. If new neurological symptoms, please seek care at ER. Call us with questions.

## 2013-02-12 ENCOUNTER — Ambulatory Visit (INDEPENDENT_AMBULATORY_CARE_PROVIDER_SITE_OTHER)
Admission: RE | Admit: 2013-02-12 | Discharge: 2013-02-12 | Disposition: A | Discharge status: Home / Self Care | Payer: Medicare Other | Source: Ambulatory Visit | Attending: Family Medicine | Admitting: Family Medicine

## 2013-02-12 DIAGNOSIS — R29810 Facial weakness: Secondary | ICD-10-CM

## 2013-02-14 ENCOUNTER — Encounter: Payer: Self-pay | Admitting: Internal Medicine

## 2013-02-27 ENCOUNTER — Ambulatory Visit (INDEPENDENT_AMBULATORY_CARE_PROVIDER_SITE_OTHER): Payer: Medicare Other | Admitting: Family Medicine

## 2013-02-27 ENCOUNTER — Encounter: Payer: Self-pay | Admitting: Family Medicine

## 2013-02-27 ENCOUNTER — Ambulatory Visit (INDEPENDENT_AMBULATORY_CARE_PROVIDER_SITE_OTHER)
Admission: RE | Admit: 2013-02-27 | Discharge: 2013-02-27 | Disposition: A | Discharge status: Home / Self Care | Payer: Medicare Other | Source: Ambulatory Visit | Attending: Family Medicine | Admitting: Family Medicine

## 2013-02-27 VITALS — BP 100/60 | HR 60 | Temp 97.6°F | Wt 137.0 lb

## 2013-02-27 DIAGNOSIS — IMO0002 Reserved for concepts with insufficient information to code with codable children: Secondary | ICD-10-CM | POA: Diagnosis not present

## 2013-02-27 DIAGNOSIS — M25569 Pain in unspecified knee: Secondary | ICD-10-CM

## 2013-02-27 DIAGNOSIS — M25562 Pain in left knee: Secondary | ICD-10-CM

## 2013-02-27 DIAGNOSIS — M23309 Other meniscus derangements, unspecified meniscus, unspecified knee: Secondary | ICD-10-CM

## 2013-02-27 NOTE — Patient Instructions (Signed)
OSTEOARTHRITIS:  For symptomatic relief: Tylenol: 2 tablets up to 3-4 times a day Regular NSAIDS are helpful (avoid in kidney disease and ulcers) Topical Capzaicin Cream, as needed (wear glove to put on)  For flares, corticosteroid injections help. Hyaluronic Acid injections have good success, average relief is 6 months  Glucosamine and Chondroitin often helpful - will take about 3 months to see if you have an effect. If you do, great, keep them up, if none at that point, no need to take in the future.  Omega-3 fish oils may help, 2 grams daily Ice joints on bad days, 20 min, 2-3 x / day REGULAR EXERCISE: swimming, Yoga, Tai Chi, bicycle (NON-IMPACT activity)  

## 2013-02-27 NOTE — Progress Notes (Signed)
Date:  02/27/2013   Name:  Ariel Phillips   DOB:  1932-06-14   MRN:  [id number] Gender: female Age: 79 y.o.  Primary Physician:  Tillman Abide, MD   Chief Complaint: Knee Pain   History of Present Illness:  Ariel Phillips is a 79 y.o. pleasant patient who presents with the following:  Left knee pain: ongoing knee pain, hurt a little bit. No specific injury. This is been ongoing for weeks to months intermittently but it has been worsening over the last week or so. She has been limping a little bit. She has taken a little bit of some Motrin as well. No effusion.  Check ankle. R 6 months ago sprain. She just wanted me to examine and make sure that felt ok  Patient Active Problem List   Diagnosis Date Noted  . Lower facial weakness 02/11/2013  . Routine general medical examination at a health care facility 12/13/2012  . Sprain of right foot 09/20/2012  . Hypothyroidism 12/12/2011  . Allergic rhinitis due to pollen   . Allergic asthma   . GERD (gastroesophageal reflux disease)   . IBS (irritable bowel syndrome)   . Osteopenia     Past Medical History  Diagnosis Date  . Allergic rhinitis due to pollen   . Allergic asthma   . GERD (gastroesophageal reflux disease)   . IBS (irritable bowel syndrome)   . Unspecified hypothyroidism   . Osteopenia     Past Surgical History  Procedure Laterality Date  . Cholecystectomy    . Tubal ligation    . Hemorrhoid surgery  1953 or so    with fissure repair  . Basal cell carcinoma excision  2000's    nose    History   Social History  . Marital Status: Divorced    Spouse Name: N/A    Number of Children: 1  . Years of Education: N/A   Occupational History  . Retired historian--consultant    Social History Main Topics  . Smoking status: Never Smoker   . Smokeless tobacco: Never Used  . Alcohol Use: Yes     Comment: rare wine  . Drug Use: No  . Sexual Activity: Not on file   Other Topics Concern  . Not on file    Social History Narrative   1 Daughter in [place]   Has living will   Daughter has health care POA.---[name]   Requests DNR--order done 06/19/12   Would not want prolonged mechanical ventilation   May accept tube feeds temporarily but not if cognitively unaware    Family History  Problem Relation Age of Onset  . COPD Mother   . Cancer Mother     unclear diagnosis  . Stroke Mother   . Diabetes Sister   . Hypertension Sister   . Cancer Paternal Grandmother     ?breast  . Heart disease Neg Hx   . Stroke Other     No Known Allergies  Medication list has been reviewed and updated.  Outpatient Prescriptions Prior to Visit  Medication Sig Dispense Refill  . albuterol (PROVENTIL HFA;VENTOLIN HFA) 108 (90 BASE) MCG/ACT inhaler Inhale 2 puffs into the lungs as needed for wheezing.       Marland Kitchen aspirin 81 MG tablet Take 81 mg by mouth every other day.      . conjugated estrogens (PREMARIN) vaginal cream Uses it externally      . fexofenadine (ALLEGRA) 180 MG tablet Take 180 mg by mouth daily.      Marland Kitchen  ketotifen (ZADITOR) 0.025 % ophthalmic solution Place 1 drop into both eyes daily.      Marland Kitchen levothyroxine (SYNTHROID, LEVOTHROID) 50 MCG tablet Take 1 tablet (50 mcg total) by mouth daily.  90 tablet  3  . Polyethyl Glycol-Propyl Glycol (SYSTANE ULTRA) 0.4-0.3 % SOLN Apply 1 drop to eye daily.      . Probiotic Product (PROBIOTIC DAILY) CAPS Take 2 capsules by mouth daily.       Marland Kitchen azelastine (ASTELIN) 137 MCG/SPRAY nasal spray SPRAY 1 SPRAY INTO BOTH NOSTRILS TWICE A DAY  90 mL  0  . psyllium (METAMUCIL) 58.6 % powder Take 1 packet by mouth every other day.       No facility-administered medications prior to visit.    Review of Systems:   GEN: No fevers, chills. Nontoxic. Primarily MSK c/o today. MSK: Detailed in the HPI GI: tolerating PO intake without difficulty Neuro: No numbness, parasthesias, or tingling associated. Otherwise the pertinent positives of the ROS are noted above.     Physical Examination: BP 100/60  Pulse 60  Temp(Src) 97.6 F (36.4 C) (Oral)  Wt 137 lb (62.143 kg)  BMI 23.5 kg/m2  Ideal Body Weight:     GEN: WDWN, NAD, Non-toxic, Alert & Oriented x 3 HEENT: Atraumatic, Normocephalic.  Ears and Nose: No external deformity. EXTR: No clubbing/cyanosis/edema NEURO: antalgic gait PSYCH: Normally interactive. Conversant. Not depressed or anxious appearing.  Calm demeanor.   Right ankle: Nontender at all bony prominences. ATFL, CFL, and deltoid ligaments are nontender. Strength is 5/5. Anterior drawer test does have some increased laxity. Subtalar tilt test is normal. Minimal pain with eversion. Otherwise, perfectly normal ankle examination.  Left knee: Full extension. Flexion to 125. Medial joint line tenderness. No significant lateral joint line tenderness. No pain with patellar manipulation. Nontender at the patellar tendon. Nontender at the tibial tubercle. Nontender at the pes. Stable anterior cruciate ligament, PCL, MCL, and LCL. Negative Murray's causes mild pain but no mechanical symptoms. Flexion pinch causes mild pain.  Dg Knee Ap/lat W/sunrise Left  02/27/2013   CLINICAL DATA:  Left knee pain, no known injury  EXAM: DG KNEE - 3 VIEWS  COMPARISON:  None.  FINDINGS: Three views of left knee submitted. No acute fracture or subluxation. No joint effusion. No radiopaque foreign body.  IMPRESSION: Negative.   Electronically Signed   By: Natasha Mead M.D.   On: 02/27/2013 12:19   Independently reviewed and agree. There is effectively no significant loss of joint space. There is a weight bearing film. No occult fracture or dislocation. Hannah Beat, MD   Assessment and Plan:  Degenerative tear of meniscus, unspecified laterality  Left knee pain - Plan: DG Knee AP/LAT W/Sunrise Left  >25 minutes spent in face to face time with patient, >50% spent in counselling or coordination of care: I think this is most likely degenerative meniscal tear.  These can be treated conservatively, and mostly okay. Initially, she is going to be on some anti-inflammatories for the next 3 weeks. Also recommended icing. Limiting her activity somewhat over the next few weeks. She will followup with me in 4 weeks if not improving.  Patient Instructions  OSTEOARTHRITIS:  For symptomatic relief:  Tylenol: 2 tablets up to 3-4 times a day Regular NSAIDS are helpful (avoid in kidney disease and ulcers)  Topical Capzaicin Cream, as needed (wear glove to put on)  For flares, corticosteroid injections help. Hyaluronic Acid injections have good success, average relief is 6 months  Glucosamine and Chondroitin  often helpful - will take about 3 months to see if you have an effect. If you do, great, keep them up, if none at that point, no need to take in the future.  Omega-3 fish oils may help, 2 grams daily  Ice joints on bad days, 20 min, 2-3 x / day REGULAR EXERCISE: swimming, Yoga, Tai Chi, bicycle (NON-IMPACT activity)      Orders Today:  Orders Placed This Encounter  Procedures  . DG Knee AP/LAT W/Sunrise Left    Standing Status: Future     Number of Occurrences: 1     Standing Expiration Date: 04/29/2014    Order Specific Question:  Reason for exam:    Answer:  knee pain, ? OA    Order Specific Question:  Preferred imaging location?    Answer:  [facility]    Updated Medication List: (Includes new medications, updates to list, dose adjustments) Meds ordered this encounter  Medications  . azelastine (ASTELIN) 137 MCG/SPRAY nasal spray    Sig: SPRAY 1 SPRAY INTO BOTH NOSTRILS ONCE  A DAY    Medications Discontinued: Medications Discontinued During This Encounter  Medication Reason  . azelastine (ASTELIN) 137 MCG/SPRAY nasal spray       Signed,  Ranesha Val T. Orel Hord, MD, CAQ Sports Medicine  [employer] [facility] [address] [place] [address] Phone: [phone] Fax: [phone]

## 2013-03-07 DIAGNOSIS — H2510 Age-related nuclear cataract, unspecified eye: Secondary | ICD-10-CM | POA: Diagnosis not present

## 2013-03-08 DIAGNOSIS — Z85828 Personal history of other malignant neoplasm of skin: Secondary | ICD-10-CM | POA: Diagnosis not present

## 2013-03-08 DIAGNOSIS — L821 Other seborrheic keratosis: Secondary | ICD-10-CM | POA: Diagnosis not present

## 2013-03-08 DIAGNOSIS — D1801 Hemangioma of skin and subcutaneous tissue: Secondary | ICD-10-CM | POA: Diagnosis not present

## 2013-06-04 ENCOUNTER — Other Ambulatory Visit: Payer: Self-pay | Admitting: Internal Medicine

## 2013-06-11 ENCOUNTER — Emergency Department: Payer: Self-pay | Admitting: Emergency Medicine

## 2013-06-11 DIAGNOSIS — R52 Pain, unspecified: Secondary | ICD-10-CM | POA: Diagnosis not present

## 2013-06-11 DIAGNOSIS — Z79899 Other long term (current) drug therapy: Secondary | ICD-10-CM | POA: Diagnosis not present

## 2013-06-11 DIAGNOSIS — E039 Hypothyroidism, unspecified: Secondary | ICD-10-CM | POA: Diagnosis not present

## 2013-06-11 DIAGNOSIS — R079 Chest pain, unspecified: Secondary | ICD-10-CM | POA: Diagnosis not present

## 2013-06-11 LAB — CBC WITH DIFFERENTIAL/PLATELET
BASOS PCT: 0.2 %
Basophil #: 0 10*3/uL (ref 0.0–0.1)
EOS PCT: 0.1 %
Eosinophil #: 0 10*3/uL (ref 0.0–0.7)
HCT: 44.2 % (ref 35.0–47.0)
HGB: 15 g/dL (ref 12.0–16.0)
Lymphocyte #: 0.2 10*3/uL — ABNORMAL LOW (ref 1.0–3.6)
Lymphocyte %: 2.1 %
MCH: 32 pg (ref 26.0–34.0)
MCHC: 33.9 g/dL (ref 32.0–36.0)
MCV: 95 fL (ref 80–100)
MONOS PCT: 2.5 %
Monocyte #: 0.2 x10 3/mm (ref 0.2–0.9)
Neutrophil #: 8.3 10*3/uL — ABNORMAL HIGH (ref 1.4–6.5)
Neutrophil %: 95.1 %
PLATELETS: 155 10*3/uL (ref 150–440)
RBC: 4.67 10*6/uL (ref 3.80–5.20)
RDW: 13.1 % (ref 11.5–14.5)
WBC: 8.7 10*3/uL (ref 3.6–11.0)

## 2013-06-11 LAB — PROTIME-INR
INR: 1.1
PROTHROMBIN TIME: 14.2 s (ref 11.5–14.7)

## 2013-06-12 LAB — BASIC METABOLIC PANEL
Anion Gap: 8 (ref 7–16)
BUN: 23 mg/dL — ABNORMAL HIGH (ref 7–18)
CHLORIDE: 103 mmol/L (ref 98–107)
CO2: 25 mmol/L (ref 21–32)
Calcium, Total: 9.3 mg/dL (ref 8.5–10.1)
Creatinine: 0.62 mg/dL (ref 0.60–1.30)
GLUCOSE: 137 mg/dL — AB (ref 65–99)
Osmolality: 278 (ref 275–301)
POTASSIUM: 4.1 mmol/L (ref 3.5–5.1)
Sodium: 136 mmol/L (ref 136–145)

## 2013-06-12 LAB — URINALYSIS, COMPLETE
BILIRUBIN, UR: NEGATIVE
Bacteria: NONE SEEN
Blood: NEGATIVE
GLUCOSE, UR: NEGATIVE mg/dL (ref 0–75)
KETONE: NEGATIVE
Leukocyte Esterase: NEGATIVE
Nitrite: NEGATIVE
Ph: 6 (ref 4.5–8.0)
Protein: NEGATIVE
RBC,UR: 3 /HPF (ref 0–5)
Specific Gravity: 1.017 (ref 1.003–1.030)
WBC UR: NONE SEEN /HPF (ref 0–5)

## 2013-06-12 LAB — TROPONIN I: Troponin-I: <0.02 ng/mL

## 2013-06-12 LAB — LIPASE, BLOOD: Lipase: 144 U/L (ref 73–393)

## 2013-06-13 ENCOUNTER — Ambulatory Visit (INDEPENDENT_AMBULATORY_CARE_PROVIDER_SITE_OTHER): Payer: Medicare Other | Admitting: Internal Medicine

## 2013-06-13 ENCOUNTER — Encounter: Payer: Self-pay | Admitting: Internal Medicine

## 2013-06-13 VITALS — BP 110/78 | HR 79 | Wt 139.0 lb

## 2013-06-13 DIAGNOSIS — Q674 Other congenital deformities of skull, face and jaw: Secondary | ICD-10-CM

## 2013-06-13 DIAGNOSIS — Q670 Congenital facial asymmetry: Secondary | ICD-10-CM

## 2013-06-13 DIAGNOSIS — K219 Gastro-esophageal reflux disease without esophagitis: Secondary | ICD-10-CM | POA: Diagnosis not present

## 2013-06-13 NOTE — Patient Instructions (Addendum)
You can add loratadine 10mg  1-2 daily or cetirizine 10mg  daily for your allergies. Please take the prilosec daily for the next 6 weeks, then you can cut down to every other day if you are doing well.

## 2013-06-13 NOTE — Progress Notes (Signed)
Subjective:    Patient ID: Ariel Phillips, female    DOB: [birth year], 80 y.o.   MRN: [id number]  HPI Has been using allegra for allergies Still with constant dripping though it helps some Discussed adding second antihistamine Hasn't tolerated steroid sprays  Has sense that left side of face is drooping Notes some asymmetry Started ~2 years ago and is worsened Never had Bell's palsy Did have MRI which was negative Congenital left eye problem --with secondary coloboma and smaller left eye  Went to ER 2 days ago-- 2nd episode of reflux which wouldn't go away Then got really painful---she was concerned about MI Responded to GI cocktail Has had multiple episodes of acid reflux but none recently Usually uses tums and mylanta Started prilosec and was given sucralfate in ER No swallowing problems  Current Outpatient Prescriptions on File Prior to Visit  Medication Sig Dispense Refill  . albuterol (PROVENTIL HFA;VENTOLIN HFA) 108 (90 BASE) MCG/ACT inhaler Inhale 2 puffs into the lungs as needed for wheezing.       Marland Kitchen aspirin 81 MG tablet Take 81 mg by mouth every other day.      Marland Kitchen azelastine (ASTELIN) 137 MCG/SPRAY nasal spray SPRAY 1 SPRAY INTO BOTH NOSTRILS TWICE A DAY  30 mL  1  . conjugated estrogens (PREMARIN) vaginal cream Uses it externally      . fexofenadine (ALLEGRA) 180 MG tablet Take 180 mg by mouth daily.      Marland Kitchen levothyroxine (SYNTHROID, LEVOTHROID) 50 MCG tablet Take 1 tablet (50 mcg total) by mouth daily.  90 tablet  3  . Polyethyl Glycol-Propyl Glycol (SYSTANE ULTRA) 0.4-0.3 % SOLN Apply 1 drop to eye daily.       No current facility-administered medications on file prior to visit.    No Known Allergies  Past Medical History  Diagnosis Date  . Allergic rhinitis due to pollen   . Allergic asthma   . GERD (gastroesophageal reflux disease)   . IBS (irritable bowel syndrome)   . Unspecified hypothyroidism   . Osteopenia     Past Surgical History  Procedure  Laterality Date  . Cholecystectomy    . Tubal ligation    . Hemorrhoid surgery  1953 or so    with fissure repair  . Basal cell carcinoma excision  2000's    nose    Family History  Problem Relation Age of Onset  . COPD Mother   . Cancer Mother     unclear diagnosis  . Stroke Mother   . Diabetes Sister   . Hypertension Sister   . Cancer Paternal Grandmother     ?breast  . Heart disease Neg Hx   . Stroke Other     History   Social History  . Marital Status: Divorced    Spouse Name: N/A    Number of Children: 1  . Years of Education: N/A   Occupational History  . Retired historian--consultant    Social History Main Topics  . Smoking status: Never Smoker   . Smokeless tobacco: Never Used  . Alcohol Use: Yes     Comment: rare wine  . Drug Use: No  . Sexual Activity: Not on file   Other Topics Concern  . Not on file   Social History Narrative   1 Daughter in [place]   Has living will   Daughter has health care POA.---[name]   Requests DNR--order done 06/19/12   Would not want prolonged mechanical ventilation   May accept tube feeds  temporarily but not if cognitively unaware   Review of Systems Appetite is fine Weight creeping up since vacation in fall---working on this Still with IBS--gets pain, then runs to bathroom with soft or loose stools    Objective:   Physical Exam  Constitutional: She appears well-developed and well-nourished. No distress.  HENT:  Mouth/Throat: Oropharynx is clear and moist. No oropharyngeal exudate.  Neck: Normal range of motion. Neck supple. No thyromegaly present.  Cardiovascular: Normal rate, regular rhythm and normal heart sounds.  Exam reveals no gallop.   No murmur heard. Pulmonary/Chest: Effort normal and breath sounds normal. No respiratory distress. She has no wheezes. She has no rales.  Abdominal: Soft. She exhibits no distension and no mass. There is no rebound and no guarding.  Mild epigastric tenderness    Musculoskeletal: She exhibits no edema.  Lymphadenopathy:    She has no cervical adenopathy.  Neurological:  CN normal Mild asymmetry in face at rest but normal CN 7 function  Psychiatric: She has a normal mood and affect. Her behavior is normal.          Assessment & Plan:

## 2013-06-13 NOTE — Assessment & Plan Note (Signed)
Mild at rest---but no signs of CN palsy

## 2013-06-13 NOTE — Assessment & Plan Note (Signed)
With severe spell Asked her to take the prilosec for 6 weeks---then try every other day Can stop the sucralfate

## 2013-06-13 NOTE — Progress Notes (Signed)
Pre-visit discussion using our clinic review tool. No additional management support is needed unless otherwise documented below in the visit note.  

## 2013-08-21 ENCOUNTER — Telehealth: Payer: Self-pay | Admitting: Internal Medicine

## 2013-08-21 NOTE — Telephone Encounter (Signed)
Had recently been in independent living but recently had to go to AL and wasn't happy about it. Now with stroke she needs rehab Discussed that she is better in the facility where she has been (even though AL) since it she is part of community there. She sounds depressed (almost universal after stroke)---may need Rx

## 2013-08-21 NOTE — Telephone Encounter (Signed)
.  left message at home and cell to have patient return my call.

## 2013-08-21 NOTE — Telephone Encounter (Signed)
Spoke with patient and per Dr. Silvio Pate pt should go back to the home for rehab. Ex-wife wanted to have a consult with dr. Silvio Pate about pt's care and I advised Dr. Silvio Pate couldn't discuss another pt's care because he's not the PCP. I also advised ex-wife to call pt's primary care physician. She was upset and hung up

## 2013-08-21 NOTE — Telephone Encounter (Signed)
Pt called today requesting consult with Dr. Silvio Pate regarding therapy for her Ex husband who had a stroke 08/17/13 and is currently at [facility]. Pt would like to discuss rehab for him. He was at assisted living at [facility] in [place] when he had the stroke. Pt has currently told the hospital to go to [facility] for rehab but would like Dr. Alla German opinion on whether or not to go to an outside temporary rehab facility or stay with [facility]. Pts husband name is Magnus Sinning [birth year]. [place] will be in and out of her home today so please call both home and cell #'s.

## 2013-08-22 ENCOUNTER — Ambulatory Visit: Payer: Self-pay | Admitting: Podiatrist

## 2013-09-05 ENCOUNTER — Encounter: Payer: Self-pay | Admitting: Podiatrist

## 2013-09-05 ENCOUNTER — Ambulatory Visit (INDEPENDENT_AMBULATORY_CARE_PROVIDER_SITE_OTHER): Payer: Medicare Other | Admitting: Podiatrist

## 2013-09-05 VITALS — BP 112/68 | HR 82 | Resp 16 | Ht 64.0 in | Wt 135.0 lb

## 2013-09-05 DIAGNOSIS — M79609 Pain in unspecified limb: Secondary | ICD-10-CM

## 2013-09-05 DIAGNOSIS — B351 Tinea unguium: Secondary | ICD-10-CM | POA: Diagnosis not present

## 2013-09-05 MED ORDER — EFINACONAZOLE 10 % EX SOLN: 1.0000 [drp] | Freq: Every day | CUTANEOUS | Status: DC

## 2013-09-05 NOTE — Progress Notes (Signed)
   Subjective:    Patient ID: Ariel Phillips, female    DOB: 1932/06/14, 80 y.o.   MRN: [id number]  HPI Comments: Right great and #2 toenail is thick discolored and painful and cant cut them. Its been about two years or more. Seen another podiatrist in [place]. He gave me a clear liquid for the other foot and it went away.  Used another liquid and as soon as i was done using it, the black had come back.      Review of Systems  HENT: Positive for hearing loss and sneezing.   Eyes: Positive for itching.  Gastrointestinal: Positive for diarrhea.       Ibs  Allergic/Immunologic: Positive for environmental allergies.       Objective:   Physical Exam  GENERAL APPEARANCE: Alert, conversant. Appropriately groomed. No acute distress.  VASCULAR: Pedal pulses palpable at 2/4 DP and PT bilateral.  Capillary refill time is immediate to all digits,  Proximal to distal cooling it warm to warm.  Digital hair growth is present bilateral  NEUROLOGIC: sensation is intact epicritically and protectively to 5.07 monofilament at 5/5 sites bilateral.  Light touch is intact bilateral, vibratory sensation intact bilateral, achilles tendon reflex is intact bilateral.  MUSCULOSKELETAL: acceptable muscle strength, tone and stability bilateral.  Intrinsic muscluature intact bilateral.  Rectus appearance of foot and digits noted bilateral.   DERMATOLOGIC: right first and second toenails are yellowish with some dark areas along the lateral and medial nail folds.  No dystrophy is present.  Remainder of toenails appear normal     Assessment & Plan:  Mycotic toenails 1 and 2 right foot  Plan: The patient is only interested in topical therapy. She cannot recall the name of the original liquid that was given to her by her original podiatrist in [place]. I discussed the 2 new prescription strength topicals and she would like to try the Jublia. I will call in a prescription through [facility] for her. She will use  this for up to one year and if she notices no improvement she will call.

## 2013-09-14 DIAGNOSIS — H6120 Impacted cerumen, unspecified ear: Secondary | ICD-10-CM | POA: Diagnosis not present

## 2013-09-24 ENCOUNTER — Ambulatory Visit (INDEPENDENT_AMBULATORY_CARE_PROVIDER_SITE_OTHER): Payer: Medicare Other | Admitting: Internal Medicine

## 2013-09-24 ENCOUNTER — Encounter: Payer: Self-pay | Admitting: Internal Medicine

## 2013-09-24 VITALS — BP 110/78 | HR 73 | Temp 97.6°F | Wt 135.0 lb

## 2013-09-24 DIAGNOSIS — R490 Dysphonia: Secondary | ICD-10-CM | POA: Insufficient documentation

## 2013-09-24 MED ORDER — AZELASTINE HCL 0.1 % NA SOLN: 1.0000 | Freq: Two times a day (BID) | NASAL | Status: DC

## 2013-09-24 NOTE — Assessment & Plan Note (Signed)
Unlikely from PND since runny nose is better Never smoker so can hold off on ENT for now Probably GERD related Has been cutting down to every other day-- told her to take bid for 2 weeks, then daily if symptoms better

## 2013-09-24 NOTE — Progress Notes (Signed)
Subjective:    Patient ID: Ariel Phillips, female    DOB: 1933/02/08, 80 y.o.   MRN: [id number]  HPI Having trouble with hoarseness This is worsening Post nasal drip is worse Allergies are better from adding loratadine  Still with itchy eyes Runny nose is much better No sig cough  Will occasionally get feeling that something gets stuck when swallowing Cut back the prilosec to every other day-- other than with flares Helps her IBS also  Current Outpatient Prescriptions on File Prior to Visit  Medication Sig Dispense Refill  . albuterol (PROVENTIL HFA;VENTOLIN HFA) 108 (90 BASE) MCG/ACT inhaler Inhale 2 puffs into the lungs as needed for wheezing.       Marland Kitchen aspirin 81 MG tablet Take 81 mg by mouth every other day.      . conjugated estrogens (PREMARIN) vaginal cream Uses it externally      . Efinaconazole 10 % SOLN Apply 1 drop topically daily.  4 mL  11  . fexofenadine (ALLEGRA) 180 MG tablet Take 180 mg by mouth daily.      Marland Kitchen levothyroxine (SYNTHROID, LEVOTHROID) 50 MCG tablet Take 1 tablet (50 mcg total) by mouth daily.  90 tablet  3  . omeprazole (PRILOSEC) 20 MG capsule Take 20 mg by mouth daily.      Vladimir Faster Glycol-Propyl Glycol (SYSTANE ULTRA) 0.4-0.3 % SOLN Apply 1 drop to eye daily.       No current facility-administered medications on file prior to visit.    No Known Allergies  Past Medical History  Diagnosis Date  . Allergic rhinitis due to pollen   . Allergic asthma   . GERD (gastroesophageal reflux disease)   . IBS (irritable bowel syndrome)   . Unspecified hypothyroidism   . Osteopenia     Past Surgical History  Procedure Laterality Date  . Cholecystectomy    . Tubal ligation    . Hemorrhoid surgery  1953 or so    with fissure repair  . Basal cell carcinoma excision  2000's    nose    Family History  Problem Relation Age of Onset  . COPD Mother   . Cancer Mother     unclear diagnosis  . Stroke Mother   . Diabetes Sister   . Hypertension  Sister   . Cancer Paternal Grandmother     ?breast  . Heart disease Neg Hx   . Stroke Other     History   Social History  . Marital Status: Divorced    Spouse Name: N/A    Number of Children: 1  . Years of Education: N/A   Occupational History  . Retired historian--consultant    Social History Main Topics  . Smoking status: Never Smoker   . Smokeless tobacco: Never Used  . Alcohol Use: Yes     Comment: rare wine  . Drug Use: No  . Sexual Activity: Not on file   Other Topics Concern  . Not on file   Social History Narrative   1 Daughter in [place]   Has living will   Daughter has health care POA.---[name]   Requests DNR--order done 06/19/12   Would not want prolonged mechanical ventilation   May accept tube feeds temporarily but not if cognitively unaware   Review of Systems Still with bloating No significant cough Rare wheezing---hasn't needed the albuterol Never was a smoker    Objective:   Physical Exam  Constitutional: She appears well-developed and well-nourished. No distress.  HENT:  Mouth/Throat:  No oropharyngeal exudate.  Slight pharyngeal injection Mild pale nasal congestion  Pulmonary/Chest: Effort normal and breath sounds normal. No respiratory distress. She has no wheezes. She has no rales.  Abdominal: Soft. There is no tenderness.          Assessment & Plan:

## 2013-09-24 NOTE — Patient Instructions (Signed)
Please increase the omeprazole to twice a day for 2 weeks---if the hoarseness is better, you can cut back to once a day. If the hoarseness persists, you should let me know (I will set up an ENT evaluation) You should keep your head up at night also

## 2013-09-24 NOTE — Progress Notes (Signed)
Pre visit review using our clinic review tool, if applicable. No additional management support is needed unless otherwise documented below in the visit note. 

## 2013-09-30 ENCOUNTER — Telehealth: Payer: Self-pay

## 2013-09-30 NOTE — Telephone Encounter (Signed)
Pt left v/m; pt picked up azelastine spray which was twice as expensive as previous rx; pt was told by pharmacist that this spray was stronger than the previously ordered azelastine spray and that is cause for the increase in price. Pt wants to know if the medication was supposed to be stronger than what she had been using. Pt request cb.

## 2013-10-02 NOTE — Telephone Encounter (Signed)
I was not aware of any change in price Can review with pharmacist and change back to lower dose if cheaper

## 2013-10-04 NOTE — Telephone Encounter (Signed)
Let her know that there are 2 strengths--- the 0.1% and 0.15% It is fine for her to use the stronger one and see how it works for her The cost isn't prohibitive--so if she thinks it works well, she can stick with this dose

## 2013-10-04 NOTE — Telephone Encounter (Signed)
Per patient she had to pay $15.00 and they gave her 0.15% is this ok to take per pt?

## 2013-10-07 NOTE — Telephone Encounter (Signed)
Spoke with patient and advised results   

## 2013-10-31 ENCOUNTER — Telehealth: Payer: Self-pay | Admitting: Internal Medicine

## 2013-10-31 ENCOUNTER — Ambulatory Visit (INDEPENDENT_AMBULATORY_CARE_PROVIDER_SITE_OTHER): Payer: Medicare Other | Admitting: Internal Medicine

## 2013-10-31 ENCOUNTER — Encounter: Payer: Self-pay | Admitting: Internal Medicine

## 2013-10-31 VITALS — BP 108/60 | HR 72 | Temp 98.0°F | Wt 135.0 lb

## 2013-10-31 DIAGNOSIS — W57XXXA Bitten or stung by nonvenomous insect and other nonvenomous arthropods, initial encounter: Secondary | ICD-10-CM | POA: Diagnosis not present

## 2013-10-31 DIAGNOSIS — T148 Other injury of unspecified body region: Secondary | ICD-10-CM

## 2013-10-31 NOTE — Progress Notes (Signed)
Subjective:    Patient ID: LISANDRA MATHISEN, female    DOB: [birth year], 80 y.o.   MRN: [id number]  HPI Got bit by something while in [place] at car show-- 13 days ago Was in newly mowed field  Noticed itching on her buttock Scratched it and something came off Couldn't see it but then was able to see it in mirror and there was round red area with center area  Seemed to go away since then but concerned about Lyme disease No central clearing when she had it  No fever No headache No arthralgias  Current Outpatient Prescriptions on File Prior to Visit  Medication Sig Dispense Refill  . albuterol (PROVENTIL HFA;VENTOLIN HFA) 108 (90 BASE) MCG/ACT inhaler Inhale 2 puffs into the lungs as needed for wheezing.       Marland Kitchen aspirin 81 MG tablet Take 81 mg by mouth every other day.      Marland Kitchen azelastine (ASTELIN) 0.1 % nasal spray Place 1 spray into both nostrils 2 (two) times daily. Use in each nostril as directed  30 mL  1  . carboxymethylcellulose (REFRESH PLUS) 0.5 % SOLN 2 drops daily as needed.      . conjugated estrogens (PREMARIN) vaginal cream Uses it externally      . Efinaconazole 10 % SOLN Apply 1 drop topically daily.  4 mL  11  . fexofenadine (ALLEGRA) 180 MG tablet Take 180 mg by mouth daily.      Marland Kitchen ketotifen (ZADITOR) 0.025 % ophthalmic solution 1 drop daily.      Marland Kitchen levothyroxine (SYNTHROID, LEVOTHROID) 50 MCG tablet Take 1 tablet (50 mcg total) by mouth daily.  90 tablet  3  . loratadine (CLARITIN) 10 MG tablet Take 10 mg by mouth daily.      Marland Kitchen omeprazole (PRILOSEC) 20 MG capsule Take 20 mg by mouth daily.       No current facility-administered medications on file prior to visit.    No Known Allergies  Past Medical History  Diagnosis Date  . Allergic rhinitis due to pollen   . Allergic asthma   . GERD (gastroesophageal reflux disease)   . IBS (irritable bowel syndrome)   . Unspecified hypothyroidism   . Osteopenia     Past Surgical History  Procedure Laterality  Date  . Cholecystectomy    . Tubal ligation    . Hemorrhoid surgery  1953 or so    with fissure repair  . Basal cell carcinoma excision  2000's    nose    Family History  Problem Relation Age of Onset  . COPD Mother   . Cancer Mother     unclear diagnosis  . Stroke Mother   . Diabetes Sister   . Hypertension Sister   . Cancer Paternal Grandmother     ?breast  . Heart disease Neg Hx   . Stroke Other     History   Social History  . Marital Status: Divorced    Spouse Name: N/A    Number of Children: 1  . Years of Education: N/A   Occupational History  . Retired historian--consultant    Social History Main Topics  . Smoking status: Never Smoker   . Smokeless tobacco: Never Used  . Alcohol Use: Yes     Comment: rare wine  . Drug Use: No  . Sexual Activity: Not on file   Other Topics Concern  . Not on file   Social History Narrative   1 Daughter in [place]  Has living will   Daughter has health care POA.---[name]   Requests DNR--order done 06/19/12   Would not want prolonged mechanical ventilation   May accept tube feeds temporarily but not if cognitively unaware   Review of Systems No facial changes --chronic asymmetry New spot on right forearm also    Objective:   Physical Exam  Constitutional: She appears well-developed and well-nourished. No distress.  Skin:  Small red spot on lower part of left buttock No swelling, redness, other rash or tenderness  Slight blood spot on right forearm--looks like it was probably from minor trauma          Assessment & Plan:

## 2013-10-31 NOTE — Assessment & Plan Note (Signed)
Reassured--no evidence of cellulitis or tick illness, etc

## 2013-10-31 NOTE — Progress Notes (Signed)
Pre visit review using our clinic review tool, if applicable. No additional management support is needed unless otherwise documented below in the visit note. 

## 2013-10-31 NOTE — Telephone Encounter (Signed)
I spoke with patient and she scheduled appointment today at 4:45.

## 2013-10-31 NOTE — Telephone Encounter (Signed)
I probably better look at the site to decide if any Rx is necessary Okay to add her on at 4:45PM

## 2013-10-31 NOTE — Telephone Encounter (Signed)
Patient Information:  Caller Name: Daneesha  Phone: [phone]  Patient: Ariel, Phillips  Gender: Female  DOB: 1932-09-04  Age: 80 Years  PCP: [name] [facility])  Office Follow Up:  Does the office need to follow up with this patient?: Yes  Instructions For The Office: Afebrile. She is not sure if she got a tick bite, red circle area has faded but brown mark, itchy still present on left buttock. Her right arm just got a pink mark on it and she is concerned,after reading an article about [facility]. Spotted fever. Please advise if Lyme Disease testing necessary.  RN Note:  Afebrile. About 2 weeks ago, at a car show walking in low cut grass,she got what she thinks was a tick bite, she did not see it but scraped "something off" and the left buttock had a bright red center and the area around it was circular pink. Did not look like a mosquito bite, which she gets a lot and it was itchy. "Weird pink circle with a dot in the middle". Today, 10/31/2013 just a small brown dot where the bite was, she thinks the head was in there and not sure if she got the head off. The concern for her is that today, 10/31/2013 the right arm has developed a faded oval red mark, which does not itch, not raised, "just sitting there and weird". RN/CAN gave homecare advice and She would like to know if she needs to be seen or have any lab work done regarding Lyme Disease/[place] Spotted fever, which she just read about.  Symptoms  Reason For Call & Symptoms: bit by tick, left buttock, scraped it off. Bright red spot in the middle of a circle of red.  Reviewed Health History In EMR: Yes  Reviewed Medications In EMR: Yes  Reviewed Allergies In EMR: Yes  Reviewed Surgeries / Procedures: Yes  Date of Onset of Symptoms: 10/21/2013  Guideline(s) Used:  Tick Bite  Disposition Per Guideline:   Home Care  Reason For Disposition Reached:   Tick bite with no complications  Advice Given:  Call Back If:  Fever or rash occur in the next 2 weeks  Bite begins to look infected  You become worse.  Wood Tick Removal:  Use a pair of tweezers and grasp the wood tick close to the skin (on its head). Pull the wood tick straight upward without twisting or crushing it. Maintain a steady pressure until it releases its grip.  Patient Will Follow Care Advice:  YES

## 2013-11-14 ENCOUNTER — Ambulatory Visit (INDEPENDENT_AMBULATORY_CARE_PROVIDER_SITE_OTHER): Payer: Medicare Other | Admitting: Podiatrist

## 2013-11-14 ENCOUNTER — Encounter: Payer: Self-pay | Admitting: Podiatrist

## 2013-11-14 VITALS — BP 102/60 | HR 80

## 2013-11-14 DIAGNOSIS — B351 Tinea unguium: Secondary | ICD-10-CM

## 2013-11-14 DIAGNOSIS — M79609 Pain in unspecified limb: Secondary | ICD-10-CM

## 2013-11-14 DIAGNOSIS — M79673 Pain in unspecified foot: Secondary | ICD-10-CM

## 2013-11-14 NOTE — Progress Notes (Signed)
Subjective: Patient presents today for followup of fungal toenail 1 into the right foot she states she's been applying the Jublia and she's concerned she may have an ingrown toenail on the right second toe as a side effect from the [place] states that it can cause ingrown toenails. Overall she's happy with the growth of the nails and states she's noticed improvement.  Objective: Neurovascular status is intact and unchanged. Patient's toenails one and 2 of the right foot have a darkish discoloration and spots present which appear to have grown out. No redness, no swelling, no signs of infection on the right toe mild irritation noted. Remainder of toenails are elongated and thickened  Assessment: Symptomatic mycotic toenails and Jublia use digits one and 2 right  Plan: Debrided the toenails without complication. Recommended continued use of the medication. We did discuss the cost of the medication but she is willing to use it due to the efficacy. Be seen back as needed

## 2013-12-19 ENCOUNTER — Ambulatory Visit (INDEPENDENT_AMBULATORY_CARE_PROVIDER_SITE_OTHER): Payer: Medicare Other | Admitting: Internal Medicine

## 2013-12-19 ENCOUNTER — Encounter: Payer: Self-pay | Admitting: Internal Medicine

## 2013-12-19 VITALS — BP 102/66 | HR 86 | Temp 97.8°F | Ht 63.5 in | Wt 133.5 lb

## 2013-12-19 DIAGNOSIS — Z23 Encounter for immunization: Secondary | ICD-10-CM | POA: Diagnosis not present

## 2013-12-19 DIAGNOSIS — E038 Other specified hypothyroidism: Secondary | ICD-10-CM | POA: Diagnosis not present

## 2013-12-19 DIAGNOSIS — Z0000 Encounter for general adult medical examination without abnormal findings: Secondary | ICD-10-CM | POA: Diagnosis not present

## 2013-12-19 DIAGNOSIS — K219 Gastro-esophageal reflux disease without esophagitis: Secondary | ICD-10-CM

## 2013-12-19 DIAGNOSIS — Z7189 Other specified counseling: Secondary | ICD-10-CM | POA: Diagnosis not present

## 2013-12-19 DIAGNOSIS — R490 Dysphonia: Secondary | ICD-10-CM

## 2013-12-19 DIAGNOSIS — K589 Irritable bowel syndrome without diarrhea: Secondary | ICD-10-CM

## 2013-12-19 DIAGNOSIS — J301 Allergic rhinitis due to pollen: Secondary | ICD-10-CM

## 2013-12-19 NOTE — Progress Notes (Signed)
Subjective:    Patient ID: Ariel Phillips, female    DOB: 16-Nov-1932, 80 y.o.   MRN: [id number]  HPI Here for Medicare wellness and follow up Reviewed form and advanced directives Rare alcohol. No tobacco Vision okay. Has hearing aides Reviewed other doctors No apparent cognitive problems---mild memory issues No falls No depression or anhedonia Independent in all ADLs  On drops for mycotic toenails Very expensive but seems to be working Does seem to be helping No pain there  Intermittently using the omeprazole Trying to cut back some Heartburn not active but still with some hoarseness. Omeprazole does seem to help this some Cramps seem better on the med--but loose stools Concerned because of potential side effects--- but feels the hoarseness may be related to PND  Gets lower abdominal cramping from IBS Then has to run to bathroom and pain is relieved High fiber diet and lots of yogurt Tried probiotic from [employer]--diarrhea seemed to worsen  Concerned about her posture Has head flexed and affects her balance No falls Wonders about PT---I don't think she needs this.  Will meet with fitness director again  No apparent thyroid problems Never had symptoms Has had some recent fatigue--but vague  Still gets nasal drip Eye drops control itchy eyes No sig cough or wheezing Satisfied with her regimen  Current Outpatient Prescriptions on File Prior to Visit  Medication Sig Dispense Refill  . albuterol (PROVENTIL HFA;VENTOLIN HFA) 108 (90 BASE) MCG/ACT inhaler Inhale 2 puffs into the lungs as needed for wheezing.       Marland Kitchen aspirin 81 MG tablet Take 81 mg by mouth every other day.      Marland Kitchen azelastine (ASTELIN) 0.1 % nasal spray Place 1 spray into both nostrils 2 (two) times daily. Use in each nostril as directed  30 mL  1  . carboxymethylcellulose (REFRESH PLUS) 0.5 % SOLN 2 drops daily as needed.      . conjugated estrogens (PREMARIN) vaginal cream Uses it externally        . Efinaconazole 10 % SOLN Apply 1 drop topically daily.  4 mL  11  . fexofenadine (ALLEGRA) 180 MG tablet Take 180 mg by mouth daily.      Marland Kitchen ketotifen (ZADITOR) 0.025 % ophthalmic solution 1 drop daily.      Marland Kitchen levothyroxine (SYNTHROID, LEVOTHROID) 50 MCG tablet Take 1 tablet (50 mcg total) by mouth daily.  90 tablet  3  . loratadine (CLARITIN) 10 MG tablet Take 10 mg by mouth daily.       No current facility-administered medications on file prior to visit.    No Known Allergies  Past Medical History  Diagnosis Date  . Allergic rhinitis due to pollen   . Allergic asthma   . GERD (gastroesophageal reflux disease)   . IBS (irritable bowel syndrome)   . Unspecified hypothyroidism   . Osteopenia     Past Surgical History  Procedure Laterality Date  . Cholecystectomy    . Tubal ligation    . Hemorrhoid surgery  1953 or so    with fissure repair  . Basal cell carcinoma excision  2000's    nose    Family History  Problem Relation Age of Onset  . COPD Mother   . Cancer Mother     unclear diagnosis  . Stroke Mother   . Diabetes Sister   . Hypertension Sister   . Cancer Paternal Grandmother     ?breast  . Heart disease Neg Hx   .  Stroke Other     History   Social History  . Marital Status: Divorced    Spouse Name: N/A    Number of Children: 1  . Years of Education: N/A   Occupational History  . Retired historian--consultant    Social History Main Topics  . Smoking status: Never Smoker   . Smokeless tobacco: Never Used  . Alcohol Use: Yes     Comment: rare wine  . Drug Use: No  . Sexual Activity: Not on file   Other Topics Concern  . Not on file   Social History Narrative   1 Daughter in [place]   Has living will   Daughter has health care POA.---[name]   Requests DNR--order done 06/19/12   Would not want prolonged mechanical ventilation   May accept tube feeds temporarily but not if cognitively unaware   Review of Systems Appetite is fine Weight  stable No urinary problems No skin problems--other than the same spots    Objective:   Physical Exam  Constitutional: She is oriented to person, place, and time. She appears well-developed and well-nourished. No distress.  HENT:  Mouth/Throat: Oropharynx is clear and moist. No oropharyngeal exudate.  Neck: Normal range of motion. Neck supple. No thyromegaly present.  Cardiovascular: Normal rate, regular rhythm, normal heart sounds and intact distal pulses.  Exam reveals no gallop.   No murmur heard. Pulmonary/Chest: Effort normal and breath sounds normal. No respiratory distress. She has no wheezes. She has no rales.  Abdominal: Soft. There is no tenderness.  Musculoskeletal: She exhibits no edema and no tenderness.  Lymphadenopathy:    She has no cervical adenopathy.  Neurological: She is alert and oriented to person, place, and time.  President-- "[address] [place], [place]" [phone] D-l--r-o-w Recall 3/3  Skin: No rash noted.  Psychiatric: She has a normal mood and affect. Her behavior is normal.          Assessment & Plan:

## 2013-12-19 NOTE — Addendum Note (Signed)
Addended by: Tammi Sou on: 12/19/2013 04:25 PM   Modules accepted: Orders

## 2013-12-19 NOTE — Assessment & Plan Note (Signed)
Seemed to be better with omeprazole She may try a different probiotic

## 2013-12-19 NOTE — Assessment & Plan Note (Signed)
Still persists Seems to be related to GERD and PND She is going to get evaluated by ENT just in case (she sees them for her hearing aides)

## 2013-12-19 NOTE — Assessment & Plan Note (Signed)
See social history 

## 2013-12-19 NOTE — Assessment & Plan Note (Signed)
Not clear cut She will try to reduce the omeprazole

## 2013-12-19 NOTE — Assessment & Plan Note (Signed)
I have personally reviewed the Medicare Annual Wellness questionnaire and have noted 1. The patient's medical and social history 2. Their use of alcohol, tobacco or illicit drugs 3. Their current medications and supplements 4. The patient's functional ability including ADL's, fall risks, home safety risks and hearing or visual             impairment. 5. Diet and physical activities 6. Evidence for depression or mood disorders  The patients weight, height, BMI and visual acuity have been recorded in the chart I have made referrals, counseling and provided education to the patient based review of the above and I have provided the pt with a written personalized care plan for preventive services.  I have provided you with a copy of your personalized plan for preventive services. Please take the time to review along with your updated medication list.  No cancer screening due to age prevnar today Yearly flu shot

## 2013-12-19 NOTE — Assessment & Plan Note (Signed)
Still symptomatic but generally satisfied

## 2013-12-19 NOTE — Progress Notes (Signed)
Pre visit review using our clinic review tool, if applicable. No additional management support is needed unless otherwise documented below in the visit note. 

## 2013-12-19 NOTE — Assessment & Plan Note (Signed)
Seems euthyroid ?Will check labs ?

## 2013-12-20 LAB — CBC WITH DIFFERENTIAL/PLATELET
BASOS ABS: 0 10*3/uL (ref 0.0–0.1)
Basophils Relative: 0.7 % (ref 0.0–3.0)
Eosinophils Absolute: 0.1 10*3/uL (ref 0.0–0.7)
Eosinophils Relative: 1.8 % (ref 0.0–5.0)
HEMATOCRIT: 44 % (ref 36.0–46.0)
Hemoglobin: 14.8 g/dL (ref 12.0–15.0)
LYMPHS ABS: 1.2 10*3/uL (ref 0.7–4.0)
Lymphocytes Relative: 21.6 % (ref 12.0–46.0)
MCHC: 33.5 g/dL (ref 30.0–36.0)
MCV: 94.3 fl (ref 78.0–100.0)
MONOS PCT: 8.8 % (ref 3.0–12.0)
Monocytes Absolute: 0.5 10*3/uL (ref 0.1–1.0)
Neutro Abs: 3.8 10*3/uL (ref 1.4–7.7)
Neutrophils Relative %: 67.1 % (ref 43.0–77.0)
PLATELETS: 186 10*3/uL (ref 150.0–400.0)
RBC: 4.66 Mil/uL (ref 3.87–5.11)
RDW: 13.4 % (ref 11.5–15.5)
WBC: 5.6 10*3/uL (ref 4.0–10.5)

## 2013-12-20 LAB — COMPREHENSIVE METABOLIC PANEL
ALT: 19 U/L (ref 0–35)
AST: 24 U/L (ref 0–37)
Albumin: 4 g/dL (ref 3.5–5.2)
Alkaline Phosphatase: 53 U/L (ref 39–117)
BUN: 23 mg/dL (ref 6–23)
CALCIUM: 9.2 mg/dL (ref 8.4–10.5)
CHLORIDE: 105 meq/L (ref 96–112)
CO2: 27 mEq/L (ref 19–32)
Creatinine, Ser: 0.7 mg/dL (ref 0.4–1.2)
GFR: 87.28 mL/min (ref >60.00)
Glucose, Bld: 86 mg/dL (ref 70–99)
Potassium: 4.8 mEq/L (ref 3.5–5.1)
SODIUM: 139 meq/L (ref 135–145)
TOTAL PROTEIN: 6.8 g/dL (ref 6.0–8.3)
Total Bilirubin: 0.7 mg/dL (ref 0.2–1.2)

## 2013-12-20 LAB — TSH: TSH: 4.61 u[IU]/mL — ABNORMAL HIGH (ref 0.35–4.50)

## 2013-12-20 LAB — T4, FREE: FREE T4: 0.87 ng/dL (ref 0.60–1.60)

## 2014-01-03 ENCOUNTER — Other Ambulatory Visit: Payer: Self-pay | Admitting: Internal Medicine

## 2014-01-03 NOTE — Telephone Encounter (Signed)
Pt left v/m requesting refill levothyroxine to [facility]; pt going out of town on 01/06/14 and request refill to be done today.pt notified done.

## 2014-03-02 ENCOUNTER — Other Ambulatory Visit: Payer: Self-pay | Admitting: Internal Medicine

## 2014-03-07 DIAGNOSIS — Z85828 Personal history of other malignant neoplasm of skin: Secondary | ICD-10-CM | POA: Diagnosis not present

## 2014-03-07 DIAGNOSIS — L821 Other seborrheic keratosis: Secondary | ICD-10-CM | POA: Diagnosis not present

## 2014-04-08 DIAGNOSIS — J309 Allergic rhinitis, unspecified: Secondary | ICD-10-CM | POA: Diagnosis not present

## 2014-04-08 DIAGNOSIS — R490 Dysphonia: Secondary | ICD-10-CM | POA: Diagnosis not present

## 2014-04-08 DIAGNOSIS — J387 Other diseases of larynx: Secondary | ICD-10-CM | POA: Diagnosis not present

## 2014-04-10 DIAGNOSIS — H43811 Vitreous degeneration, right eye: Secondary | ICD-10-CM | POA: Diagnosis not present

## 2014-04-14 DIAGNOSIS — H6123 Impacted cerumen, bilateral: Secondary | ICD-10-CM | POA: Diagnosis not present

## 2014-04-14 DIAGNOSIS — H905 Unspecified sensorineural hearing loss: Secondary | ICD-10-CM | POA: Diagnosis not present

## 2014-04-21 ENCOUNTER — Other Ambulatory Visit: Payer: Self-pay | Admitting: Internal Medicine

## 2014-04-21 NOTE — Telephone Encounter (Signed)
Pt left v/m requesting refill astelin spray to [facility]. Left v/m that astelin had been sent electronically to [facility]; pt to ck with pharmacy.

## 2014-05-06 DIAGNOSIS — J301 Allergic rhinitis due to pollen: Secondary | ICD-10-CM | POA: Diagnosis not present

## 2014-05-09 DIAGNOSIS — J301 Allergic rhinitis due to pollen: Secondary | ICD-10-CM | POA: Diagnosis not present

## 2014-05-19 DIAGNOSIS — J301 Allergic rhinitis due to pollen: Secondary | ICD-10-CM | POA: Diagnosis not present

## 2014-05-20 DIAGNOSIS — R490 Dysphonia: Secondary | ICD-10-CM | POA: Diagnosis not present

## 2014-05-20 DIAGNOSIS — H903 Sensorineural hearing loss, bilateral: Secondary | ICD-10-CM | POA: Diagnosis not present

## 2014-05-26 DIAGNOSIS — J301 Allergic rhinitis due to pollen: Secondary | ICD-10-CM | POA: Diagnosis not present

## 2014-05-29 DIAGNOSIS — J301 Allergic rhinitis due to pollen: Secondary | ICD-10-CM | POA: Diagnosis not present

## 2014-06-02 DIAGNOSIS — J301 Allergic rhinitis due to pollen: Secondary | ICD-10-CM | POA: Diagnosis not present

## 2014-06-04 IMAGING — CT CT HEAD W/O CM
1 series · 16 of 30 positions shown, 20 images · non-contrast
Comparison: None.

CLINICAL DATA: Left-sided facial weakness

EXAM:
CT HEAD WITHOUT CONTRAST
TECHNIQUE: Contiguous axial images were obtained from the base of the skull
through the vertex without intravenous contrast.

[Series 2: head_seq -c 4.5 h37s st · axial · 0.44mm/px · z∈[-124,+2]mm · 16 of 32 slices shown, 20 images]
[im 2/32  brain]
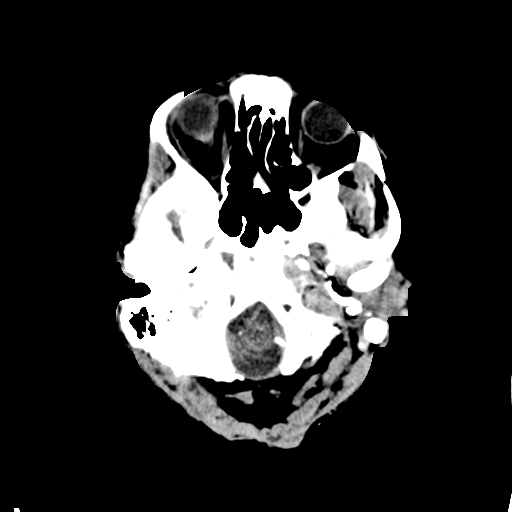
[im 2/32  bone]
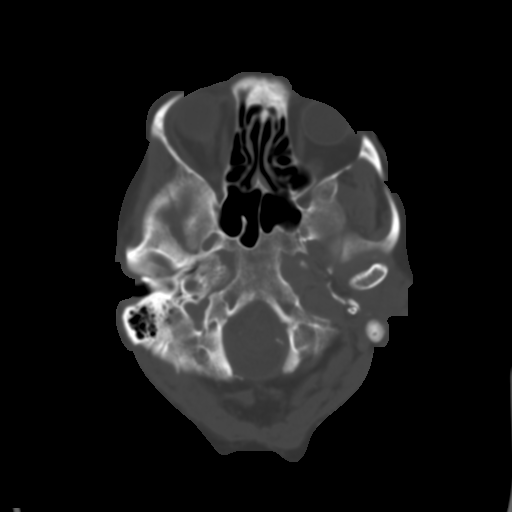
[im 4/32  brain]
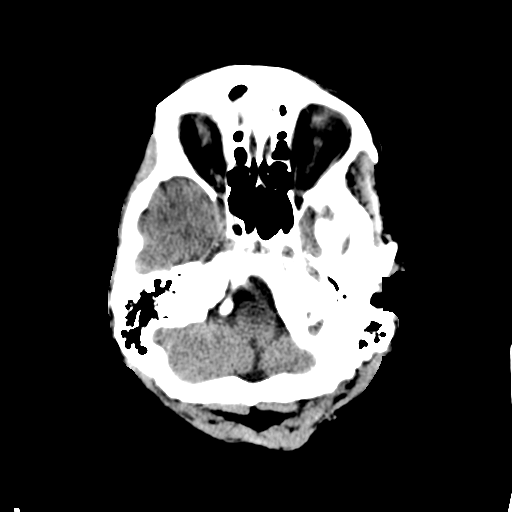
[im 6/32  brain]
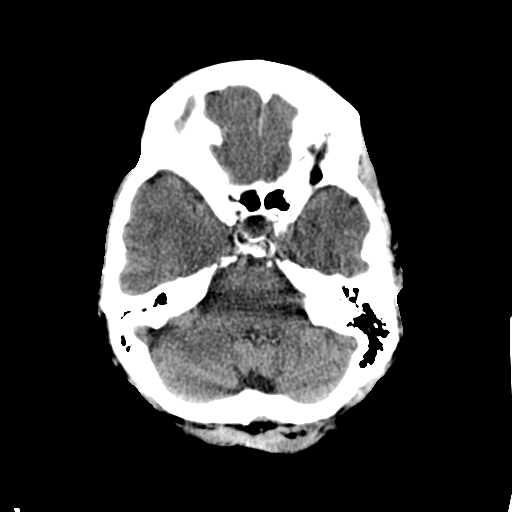
[im 8/32  brain]
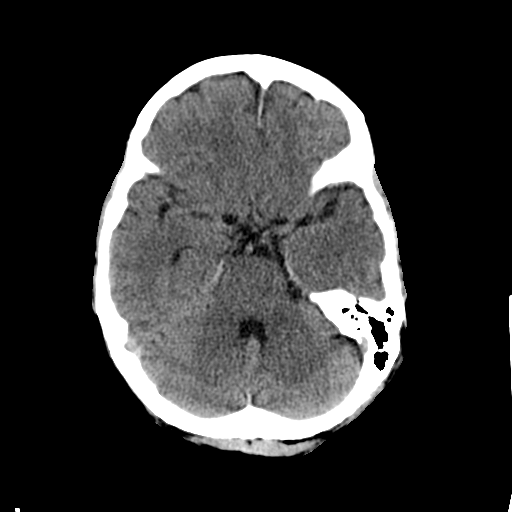
[im 9/32  brain]
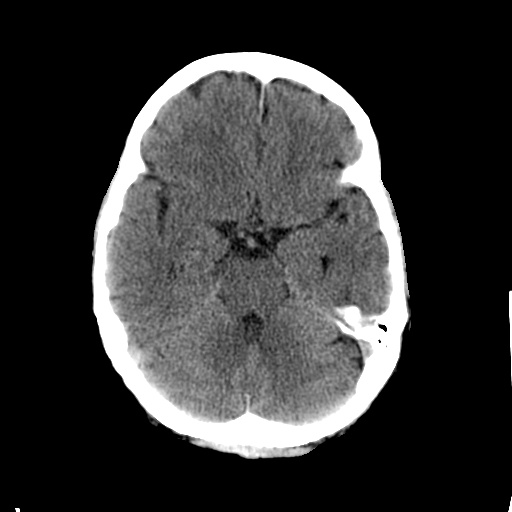
[im 9/32  bone]
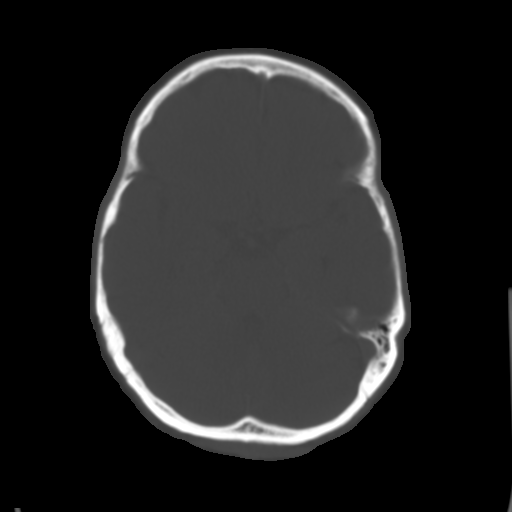
[im 11/32  brain]
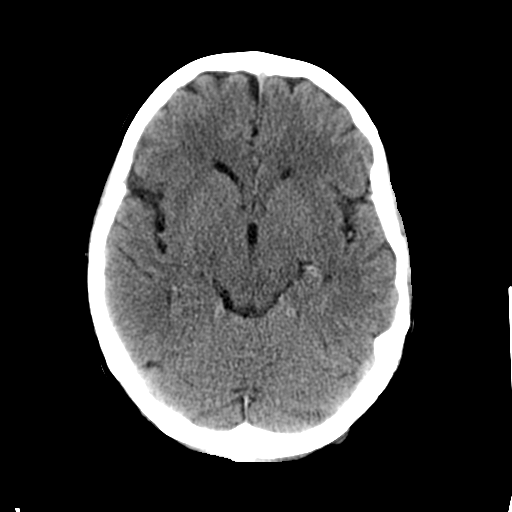
[im 13/32  brain]
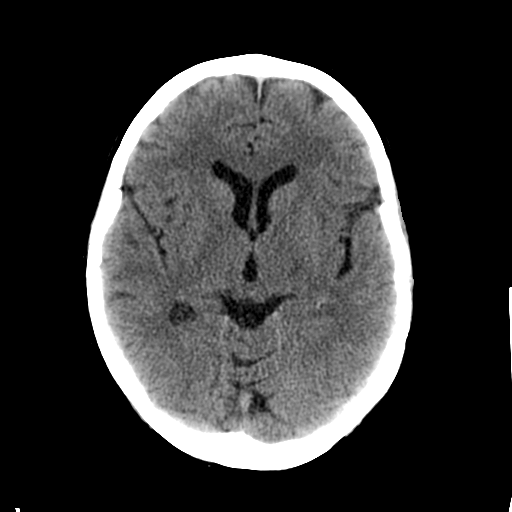
[im 15/32  brain]
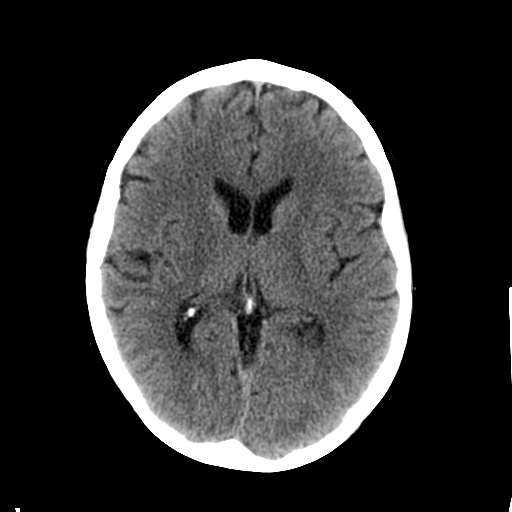
[im 17/32  brain]
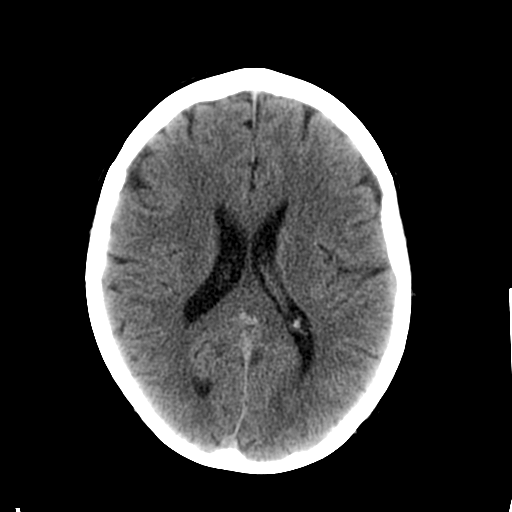
[im 17/32  bone]
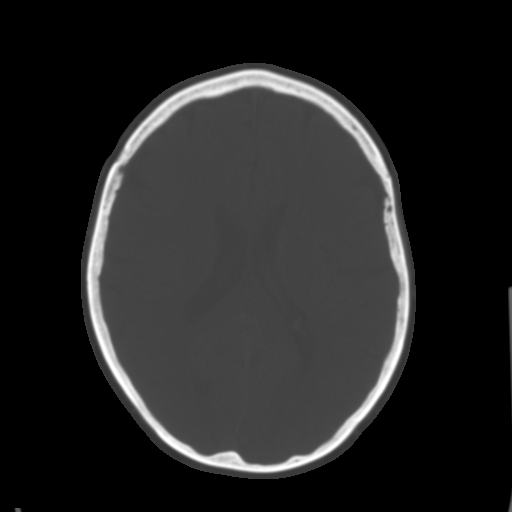
[im 19/32  brain]
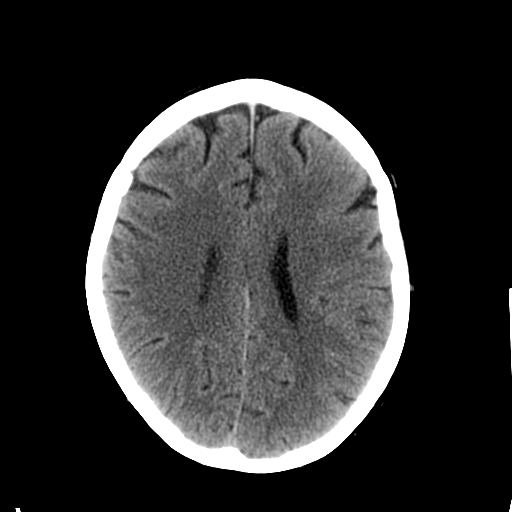
[im 21/32  brain]
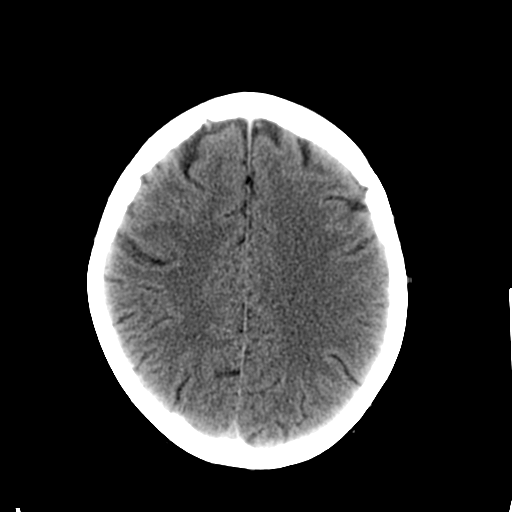
[im 23/32  brain]
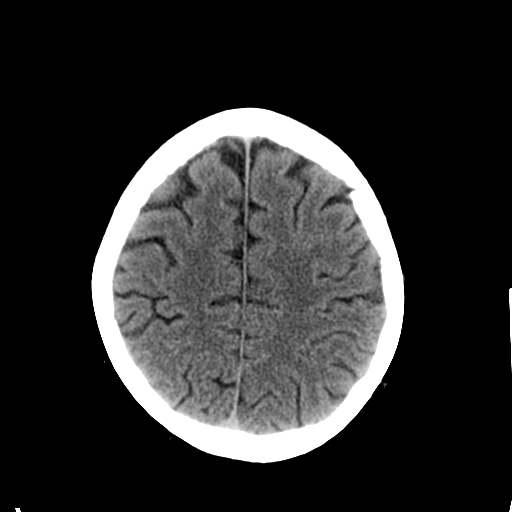
[im 24/32  brain]
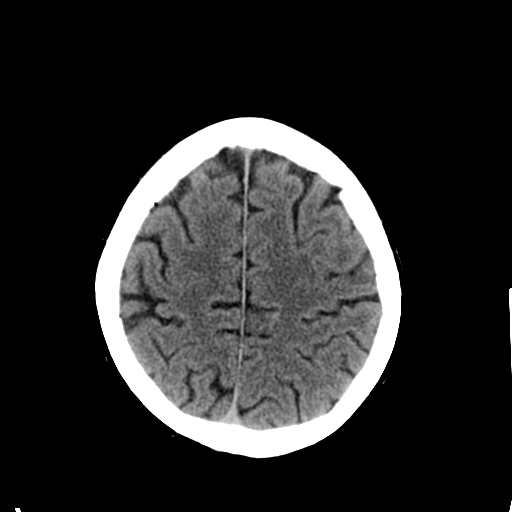
[im 24/32  bone]
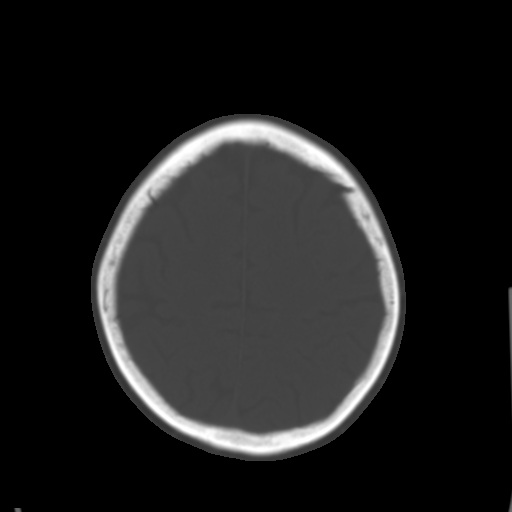
[im 26/32  brain]
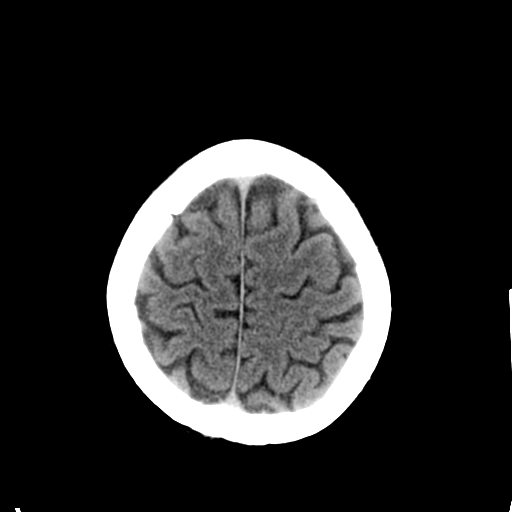
[im 28/32  brain]
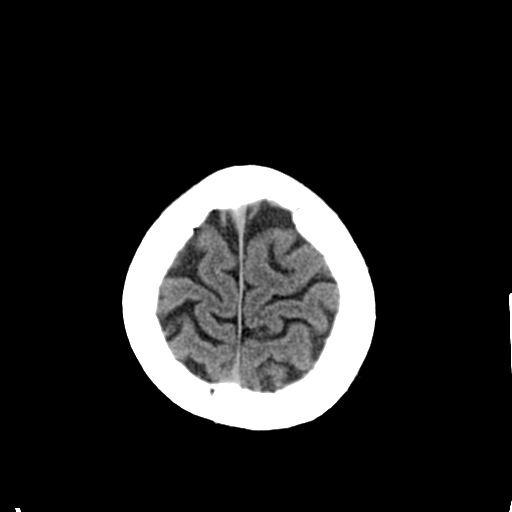
[im 30/32  brain]
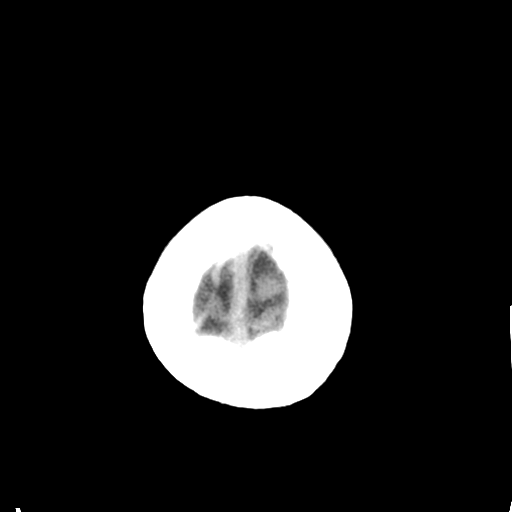

[16 of 30 positions shown; findings below may reference images not displayed]

FINDINGS: The ventricles are normal in size and configuration. There is no
mass, hemorrhage, extra-axial fluid collection, or midline shift.
There is minimal small vessel disease in the centra semiovale
bilaterally. Elsewhere, gray-white compartments are normal. There is
no demonstrable acute infarct.

Bony calvarium appears intact. The mastoid air cells are clear.
IMPRESSION: Minimal periventricular small vessel disease. Study otherwise
unremarkable.

## 2014-06-05 DIAGNOSIS — J301 Allergic rhinitis due to pollen: Secondary | ICD-10-CM | POA: Diagnosis not present

## 2014-06-06 DIAGNOSIS — J301 Allergic rhinitis due to pollen: Secondary | ICD-10-CM | POA: Diagnosis not present

## 2014-06-09 DIAGNOSIS — J301 Allergic rhinitis due to pollen: Secondary | ICD-10-CM | POA: Diagnosis not present

## 2014-06-12 DIAGNOSIS — J301 Allergic rhinitis due to pollen: Secondary | ICD-10-CM | POA: Diagnosis not present

## 2014-06-19 DIAGNOSIS — J301 Allergic rhinitis due to pollen: Secondary | ICD-10-CM | POA: Diagnosis not present

## 2014-06-23 DIAGNOSIS — J301 Allergic rhinitis due to pollen: Secondary | ICD-10-CM | POA: Diagnosis not present

## 2014-06-26 DIAGNOSIS — J301 Allergic rhinitis due to pollen: Secondary | ICD-10-CM | POA: Diagnosis not present

## 2014-06-27 DIAGNOSIS — J301 Allergic rhinitis due to pollen: Secondary | ICD-10-CM | POA: Diagnosis not present

## 2014-06-30 DIAGNOSIS — J301 Allergic rhinitis due to pollen: Secondary | ICD-10-CM | POA: Diagnosis not present

## 2014-07-03 DIAGNOSIS — J301 Allergic rhinitis due to pollen: Secondary | ICD-10-CM | POA: Diagnosis not present

## 2014-07-05 ENCOUNTER — Other Ambulatory Visit: Payer: Self-pay | Admitting: Internal Medicine

## 2014-07-07 DIAGNOSIS — J301 Allergic rhinitis due to pollen: Secondary | ICD-10-CM | POA: Diagnosis not present

## 2014-07-10 DIAGNOSIS — J301 Allergic rhinitis due to pollen: Secondary | ICD-10-CM | POA: Diagnosis not present

## 2014-07-11 DIAGNOSIS — J301 Allergic rhinitis due to pollen: Secondary | ICD-10-CM | POA: Diagnosis not present

## 2014-07-14 DIAGNOSIS — J301 Allergic rhinitis due to pollen: Secondary | ICD-10-CM | POA: Diagnosis not present

## 2014-07-15 DIAGNOSIS — J301 Allergic rhinitis due to pollen: Secondary | ICD-10-CM | POA: Diagnosis not present

## 2014-07-17 DIAGNOSIS — J301 Allergic rhinitis due to pollen: Secondary | ICD-10-CM | POA: Diagnosis not present

## 2014-07-21 DIAGNOSIS — J301 Allergic rhinitis due to pollen: Secondary | ICD-10-CM | POA: Diagnosis not present

## 2014-07-24 DIAGNOSIS — J301 Allergic rhinitis due to pollen: Secondary | ICD-10-CM | POA: Diagnosis not present

## 2014-07-28 DIAGNOSIS — J301 Allergic rhinitis due to pollen: Secondary | ICD-10-CM | POA: Diagnosis not present

## 2014-08-04 DIAGNOSIS — J301 Allergic rhinitis due to pollen: Secondary | ICD-10-CM | POA: Diagnosis not present

## 2014-08-07 DIAGNOSIS — J301 Allergic rhinitis due to pollen: Secondary | ICD-10-CM | POA: Diagnosis not present

## 2014-08-11 DIAGNOSIS — J301 Allergic rhinitis due to pollen: Secondary | ICD-10-CM | POA: Diagnosis not present

## 2014-08-18 DIAGNOSIS — J301 Allergic rhinitis due to pollen: Secondary | ICD-10-CM | POA: Diagnosis not present

## 2014-08-21 DIAGNOSIS — J301 Allergic rhinitis due to pollen: Secondary | ICD-10-CM | POA: Diagnosis not present

## 2014-08-25 DIAGNOSIS — J301 Allergic rhinitis due to pollen: Secondary | ICD-10-CM | POA: Diagnosis not present

## 2014-08-28 DIAGNOSIS — J301 Allergic rhinitis due to pollen: Secondary | ICD-10-CM | POA: Diagnosis not present

## 2014-08-29 DIAGNOSIS — J301 Allergic rhinitis due to pollen: Secondary | ICD-10-CM | POA: Diagnosis not present

## 2014-09-08 DIAGNOSIS — J301 Allergic rhinitis due to pollen: Secondary | ICD-10-CM | POA: Diagnosis not present

## 2014-09-11 DIAGNOSIS — J301 Allergic rhinitis due to pollen: Secondary | ICD-10-CM | POA: Diagnosis not present

## 2014-09-15 DIAGNOSIS — J301 Allergic rhinitis due to pollen: Secondary | ICD-10-CM | POA: Diagnosis not present

## 2014-09-18 DIAGNOSIS — J301 Allergic rhinitis due to pollen: Secondary | ICD-10-CM | POA: Diagnosis not present

## 2014-09-22 DIAGNOSIS — J301 Allergic rhinitis due to pollen: Secondary | ICD-10-CM | POA: Diagnosis not present

## 2014-09-24 DIAGNOSIS — J387 Other diseases of larynx: Secondary | ICD-10-CM | POA: Diagnosis not present

## 2014-09-24 DIAGNOSIS — R070 Pain in throat: Secondary | ICD-10-CM | POA: Diagnosis not present

## 2014-09-25 DIAGNOSIS — J301 Allergic rhinitis due to pollen: Secondary | ICD-10-CM | POA: Diagnosis not present

## 2014-10-02 DIAGNOSIS — J301 Allergic rhinitis due to pollen: Secondary | ICD-10-CM | POA: Diagnosis not present

## 2014-10-09 DIAGNOSIS — J301 Allergic rhinitis due to pollen: Secondary | ICD-10-CM | POA: Diagnosis not present

## 2014-10-10 DIAGNOSIS — J301 Allergic rhinitis due to pollen: Secondary | ICD-10-CM | POA: Diagnosis not present

## 2014-10-16 DIAGNOSIS — J301 Allergic rhinitis due to pollen: Secondary | ICD-10-CM | POA: Diagnosis not present

## 2014-10-23 DIAGNOSIS — J301 Allergic rhinitis due to pollen: Secondary | ICD-10-CM | POA: Diagnosis not present

## 2014-11-06 DIAGNOSIS — J301 Allergic rhinitis due to pollen: Secondary | ICD-10-CM | POA: Diagnosis not present

## 2014-11-12 DIAGNOSIS — J301 Allergic rhinitis due to pollen: Secondary | ICD-10-CM | POA: Diagnosis not present

## 2014-11-17 DIAGNOSIS — J301 Allergic rhinitis due to pollen: Secondary | ICD-10-CM | POA: Diagnosis not present

## 2014-11-18 ENCOUNTER — Ambulatory Visit (INDEPENDENT_AMBULATORY_CARE_PROVIDER_SITE_OTHER): Payer: Medicare Other | Admitting: Internal Medicine

## 2014-11-18 ENCOUNTER — Encounter: Payer: Self-pay | Admitting: Internal Medicine

## 2014-11-18 VITALS — BP 100/68 | HR 64 | Temp 98.4°F | Wt 134.1 lb

## 2014-11-18 DIAGNOSIS — S50811A Abrasion of right forearm, initial encounter: Secondary | ICD-10-CM | POA: Diagnosis not present

## 2014-11-18 DIAGNOSIS — W5503XA Scratched by cat, initial encounter: Secondary | ICD-10-CM

## 2014-11-18 MED ORDER — MUPIROCIN 2 % EX OINT: 1.0000 "application " | TOPICAL_OINTMENT | Freq: Two times a day (BID) | CUTANEOUS | Status: DC

## 2014-11-18 NOTE — Progress Notes (Signed)
Subjective:    Patient ID: Ariel Phillips, female    DOB: [birth year], 81 y.o.   MRN: [id number]  HPI  Pt presents to the clinic today with c/o a cat scratch on her right arm. This occurred 1 week ago, while she was trying to take her cat out of the crate. She had 4 puncture wounds. 2 have resolved. 2 have gotten bright red. They have scabbed over. She has not noticed any discharge or pain. She denies fever, chills or body aches. She did clean it . She has put warm compresses on it without any relief. She had her tetanus in 2012.  Review of Systems      Past Medical History  Diagnosis Date  . Allergic rhinitis due to pollen   . Allergic asthma   . GERD (gastroesophageal reflux disease)   . IBS (irritable bowel syndrome)   . Unspecified hypothyroidism   . Osteopenia     Current Outpatient Prescriptions  Medication Sig Dispense Refill  . aspirin 81 MG tablet Take 81 mg by mouth every other day.    Marland Kitchen azelastine (ASTELIN) 0.1 % nasal spray SPRAY 1 SPRAY INTO BOTH NOSTRILS TWICE A DAY 30 mL 1  . carboxymethylcellulose (REFRESH PLUS) 0.5 % SOLN 2 drops daily as needed.    . conjugated estrogens (PREMARIN) vaginal cream Uses it externally    . Efinaconazole 10 % SOLN Apply 1 drop topically daily. (Patient not taking: Reported on 11/18/2014) 4 mL 11  . fexofenadine (ALLEGRA) 180 MG tablet Take 180 mg by mouth daily.    Marland Kitchen ketotifen (ZADITOR) 0.025 % ophthalmic solution 1 drop daily.    Marland Kitchen levothyroxine (SYNTHROID, LEVOTHROID) 50 MCG tablet TAKE 1 TABLET (50 MCG TOTAL) BY MOUTH DAILY. 90 tablet 1  . levothyroxine (SYNTHROID, LEVOTHROID) 50 MCG tablet TAKE 1 TABLET (50 MCG TOTAL) BY MOUTH DAILY. (Patient not taking: Reported on 11/18/2014) 90 tablet 1  . loratadine (CLARITIN) 10 MG tablet Take 10 mg by mouth daily.    Marland Kitchen omeprazole (PRILOSEC) 20 MG capsule Take 20 mg by mouth daily.    Marland Kitchen PROAIR HFA 108 (90 BASE) MCG/ACT inhaler INHALE 2 PUFFS INTO THE LUNGS 3 (THREE) TIMES DAILY AS NEEDED. 8.5  each 0   No current facility-administered medications for this visit.    No Known Allergies  Family History  Problem Relation Age of Onset  . COPD Mother   . Cancer Mother     unclear diagnosis  . Stroke Mother   . Diabetes Sister   . Hypertension Sister   . Cancer Paternal Grandmother     ?breast  . Heart disease Neg Hx   . Stroke Other     History   Social History  . Marital Status: Divorced    Spouse Name: N/A  . Number of Children: 1  . Years of Education: N/A   Occupational History  . Retired historian--consultant    Social History Main Topics  . Smoking status: Never Smoker   . Smokeless tobacco: Never Used  . Alcohol Use: Yes     Comment: rare wine  . Drug Use: No  . Sexual Activity: Not on file   Other Topics Concern  . Not on file   Social History Narrative   1 Daughter in [place]   Has living will   Daughter has health care POA.---[name]   Requests DNR--order done 06/19/12   Would not want prolonged mechanical ventilation   May accept tube feeds temporarily but not if  cognitively unaware     Constitutional: Denies fever, malaise, fatigue, headache or abrupt weight changes.  HEENT: Denies eye pain, eye redness, ear pain, ringing in the ears, wax buildup, runny nose, nasal congestion, bloody nose, or sore throat. Respiratory: Denies difficulty breathing, shortness of breath, cough or sputum production.   Cardiovascular: Denies chest pain, chest tightness, palpitations or swelling in the hands or feet.  Skin: Pt reports cat scratch. Denies rashes, lesions or ulcercations.    No other specific complaints in a complete review of systems (except as listed in HPI above).  Objective:   Physical Exam   BP 100/68 mmHg  Pulse 64  Temp(Src) 98.4 F (36.9 C) (Oral)  Wt 134 lb 1.9 oz (60.836 kg)  SpO2 98% Wt Readings from Last 3 Encounters:  11/18/14 134 lb 1.9 oz (60.836 kg)  12/19/13 133 lb 8 oz (60.555 kg)  10/31/13 135 lb (61.236 kg)     General: Appears her stated age, well developed, well nourished in NAD. Skin: 2 small scabbed puncture wounds noted of right forearm. Local reaction noted. No drainage noted. No cellulitis. Cardiovascular: Normal rate and rhythm. S1,S2 noted.  No murmur, rubs or gallops noted.  Pulmonary/Chest: Normal effort and positive vesicular breath sounds. No respiratory distress. No wheezes, rales or ronchi noted.   BMET    Component Value Date/Time   NA 139 12/19/2013 1616   NA 136 06/11/2013 2224   K 4.8 12/19/2013 1616   K 4.1 06/11/2013 2224   CL 105 12/19/2013 1616   CL 103 06/11/2013 2224   CO2 27 12/19/2013 1616   CO2 25 06/11/2013 2224   GLUCOSE 86 12/19/2013 1616   GLUCOSE 137* 06/11/2013 2224   BUN 23 12/19/2013 1616   BUN 23* 06/11/2013 2224   CREATININE 0.7 12/19/2013 1616   CREATININE 0.62 06/11/2013 2224   CALCIUM 9.2 12/19/2013 1616   CALCIUM 9.3 06/11/2013 2224   GFRNONAA >60 06/11/2013 2224   GFRAA >60 06/11/2013 2224    Lipid Panel     Component Value Date/Time   CHOL 184 12/12/2011 1517   TRIG 56.0 12/12/2011 1517   HDL 74.70 12/12/2011 1517   CHOLHDL 2 12/12/2011 1517   VLDL 11.2 12/12/2011 1517   LDLCALC 98 12/12/2011 1517    CBC    Component Value Date/Time   WBC 5.6 12/19/2013 1616   WBC 8.7 06/11/2013 2224   RBC 4.66 12/19/2013 1616   RBC 4.67 06/11/2013 2224   HGB 14.8 12/19/2013 1616   HGB 15.0 06/11/2013 2224   HCT 44.0 12/19/2013 1616   HCT 44.2 06/11/2013 2224   PLT 186.0 12/19/2013 1616   PLT 155 06/11/2013 2224   MCV 94.3 12/19/2013 1616   MCV 95 06/11/2013 2224   MCH 32.0 06/11/2013 2224   MCHC 33.5 12/19/2013 1616   MCHC 33.9 06/11/2013 2224   RDW 13.4 12/19/2013 1616   RDW 13.1 06/11/2013 2224   LYMPHSABS 1.2 12/19/2013 1616   LYMPHSABS 0.2* 06/11/2013 2224   MONOABS 0.5 12/19/2013 1616   MONOABS 0.2 06/11/2013 2224   EOSABS 0.1 12/19/2013 1616   EOSABS 0.0 06/11/2013 2224   BASOSABS 0.0 12/19/2013 1616   BASOSABS 0.0  06/11/2013 2224    Hgb A1C No results found for: HGBA1C      Assessment & Plan:   Catch scratch of right forearm:  Local reaction only Offered prophylactic RX for Augmentin because the punctures look deep but she would like to hold off at this time eRx for Bactroban  BID until resolved Return precautions given  RTC as needed or if symptoms persist or worsen

## 2014-11-18 NOTE — Progress Notes (Signed)
Pre visit review using our clinic review tool, if applicable. No additional management support is needed unless otherwise documented below in the visit note. 

## 2014-11-18 NOTE — Patient Instructions (Signed)
Cat Scratch Disease Cats often injure people by scratching or biting. This site of injury can become infected with a particular germ or bacteria present in the mouth of or on the cat. This germ is called Bartonella henselae. This infection is identified by the common name cat scratch disease (CSD).  SYMPTOMS  A red and sore pimple or bump, with or without pus, on the skin where the cat scratched or bit. The pimple or sore may be present for as long as three weeks after the scratch or bite occurred.  One or more enlarged lymph glands located toward the center of the body from where the injury occurred.  Less common symptoms include low-grade fever, tiredness, fatigue, headache and/or sore throat. DIAGNOSIS  The diagnosis is typically made by your caregiver who notes the history of a scratch or bite from a cat, and finds the skin sore and swollen lymph glands in the described area.  Culture of any drainage or pus from the injury site, or a needle aspiration or piece of tissue (biopsy) from a swollen lymph gland may also be done to confirm the diagnosis and assure that a different infection or disease is not causing your illness. Rare but serious complications may occur, they include:  Parinaud's syndrome - fever, swollen lymph glands and inflammation of the eye (conjunctivitis).  Infection of the brain (encephalitis).  Infection of the nerve of the eye (neuroretinitis).  Infection of the bone (osteomyelitis). TREATMENT  Usually treatment is not necessary or helpful, especially if you have a normal immune system. When infection is very severe, it may be treated with a medicine that kills the bacteria (antibiotic).  People with immune system problems (such as having AIDS or an organ transplant, or being on steroids or other immune modifying drugs) should be treated with antibiotics. HOME CARE INSTRUCTIONS   Avoid injury while playing with cats.  Wash well after playing with cats.  Do  not let your cat lick sores on your body.  Do not let your cat roam around outside of your house.  Keep the area of the cat scratch clean. Wash it with soap and water or apply an antiseptic solution such as povidone iodine.  You should get a tetanus shot if you have not had one in the past 5 or 10 years. If you receive one, your arm may get swollen and red and warm to the touch at the shot site. This is a common response to the medication in the shot. If you did not receive a tetanus shot here because you did not recall when your last one was given, make sure to check with your caregiver's office and determine if one is needed. Generally, for a "dirty" wound, you should receive a tetanus booster if you have not had one in the last five years. If you have a "clean" wound, you should receive a tetanus booster if you have not had one in the last ten years. SEEK IMMEDIATE MEDICAL CARE IF:   You have worsening signs of infection, such as more redness, increased pain, red streaking or pus coming from the wound, or warmth or swelling around the area of the scratch.  You develop worsening swollen lymph glands.  You develop abdominal pain, have problems with your vision or develop a skin rash.  You have a fever.  You become more tired or dizzy, or have a worsening headache.  You develop inflammation of your eye or have increasing vision problems.  You have pain in  one of your bones.  You develop a stiff neck.  You pass out. MAKE SURE YOU:   Understand these instructions.  Will watch your condition.  Will get help right away if you are not doing well or get worse. Document Released: 04/15/2000 Document Revised: 07/11/2011 Document Reviewed: 05/28/2008 [facility] Patient Information 2015 [place], [place]. This information is not intended to replace advice given to you by your health care provider. Make sure you discuss any questions you have with your health care provider.

## 2014-11-20 ENCOUNTER — Telehealth: Payer: Self-pay | Admitting: Internal Medicine

## 2014-11-20 NOTE — Telephone Encounter (Signed)
Pt called and request call back about the ointment rx you prescribed yesterday for her. She has and instruction question.  Call back # [phone]

## 2014-11-20 NOTE — Telephone Encounter (Signed)
Call pt:  Instructions should be to use two time daily, apply to affected area. I know it says 2 times in the nose but this is to treat MRSA colonization. She needs to put it directly on the wound.

## 2014-11-21 NOTE — Telephone Encounter (Signed)
Pt is aware as instructed--expressed understanding

## 2014-11-24 DIAGNOSIS — J301 Allergic rhinitis due to pollen: Secondary | ICD-10-CM | POA: Diagnosis not present

## 2014-12-01 ENCOUNTER — Other Ambulatory Visit: Payer: Self-pay | Admitting: Internal Medicine

## 2014-12-01 DIAGNOSIS — J301 Allergic rhinitis due to pollen: Secondary | ICD-10-CM | POA: Diagnosis not present

## 2014-12-08 DIAGNOSIS — J301 Allergic rhinitis due to pollen: Secondary | ICD-10-CM | POA: Diagnosis not present

## 2014-12-15 DIAGNOSIS — J301 Allergic rhinitis due to pollen: Secondary | ICD-10-CM | POA: Diagnosis not present

## 2014-12-22 ENCOUNTER — Encounter: Payer: Self-pay | Admitting: Internal Medicine

## 2014-12-22 ENCOUNTER — Ambulatory Visit (INDEPENDENT_AMBULATORY_CARE_PROVIDER_SITE_OTHER): Payer: Medicare Other | Admitting: Internal Medicine

## 2014-12-22 VITALS — BP 110/60 | HR 71 | Temp 97.6°F | Ht 64.0 in | Wt 132.0 lb

## 2014-12-22 DIAGNOSIS — Z0000 Encounter for general adult medical examination without abnormal findings: Secondary | ICD-10-CM

## 2014-12-22 DIAGNOSIS — Z7189 Other specified counseling: Secondary | ICD-10-CM

## 2014-12-22 DIAGNOSIS — Z23 Encounter for immunization: Secondary | ICD-10-CM

## 2014-12-22 DIAGNOSIS — J301 Allergic rhinitis due to pollen: Secondary | ICD-10-CM | POA: Diagnosis not present

## 2014-12-22 DIAGNOSIS — E039 Hypothyroidism, unspecified: Secondary | ICD-10-CM

## 2014-12-22 DIAGNOSIS — K219 Gastro-esophageal reflux disease without esophagitis: Secondary | ICD-10-CM

## 2014-12-22 LAB — COMPREHENSIVE METABOLIC PANEL
ALK PHOS: 48 U/L (ref 39–117)
ALT: 15 U/L (ref 0–35)
AST: 19 U/L (ref 0–37)
Albumin: 3.8 g/dL (ref 3.5–5.2)
BUN: 17 mg/dL (ref 6–23)
CALCIUM: 9.3 mg/dL (ref 8.4–10.5)
CO2: 28 mEq/L (ref 19–32)
CREATININE: 0.72 mg/dL (ref 0.40–1.20)
Chloride: 106 mEq/L (ref 96–112)
GFR: 82.88 mL/min (ref >60.00)
GLUCOSE: 78 mg/dL (ref 70–99)
Potassium: 4.3 mEq/L (ref 3.5–5.1)
Sodium: 140 mEq/L (ref 135–145)
TOTAL PROTEIN: 6.5 g/dL (ref 6.0–8.3)
Total Bilirubin: 0.4 mg/dL (ref 0.2–1.2)

## 2014-12-22 LAB — CBC WITH DIFFERENTIAL/PLATELET
BASOS ABS: 0 10*3/uL (ref 0.0–0.1)
Basophils Relative: 0.6 % (ref 0.0–3.0)
Eosinophils Absolute: 0.1 10*3/uL (ref 0.0–0.7)
Eosinophils Relative: 2.8 % (ref 0.0–5.0)
HCT: 43.1 % (ref 36.0–46.0)
Hemoglobin: 14.3 g/dL (ref 12.0–15.0)
Lymphocytes Relative: 32.4 % (ref 12.0–46.0)
Lymphs Abs: 1.2 10*3/uL (ref 0.7–4.0)
MCHC: 33.2 g/dL (ref 30.0–36.0)
MCV: 93.2 fl (ref 78.0–100.0)
MONOS PCT: 10.7 % (ref 3.0–12.0)
Monocytes Absolute: 0.4 10*3/uL (ref 0.1–1.0)
Neutro Abs: 1.9 10*3/uL (ref 1.4–7.7)
Neutrophils Relative %: 53.5 % (ref 43.0–77.0)
PLATELETS: 161 10*3/uL (ref 150.0–400.0)
RBC: 4.62 Mil/uL (ref 3.87–5.11)
RDW: 13.5 % (ref 11.5–15.5)
WBC: 3.6 10*3/uL — ABNORMAL LOW (ref 4.0–10.5)

## 2014-12-22 LAB — TSH: TSH: 4.21 u[IU]/mL (ref 0.35–4.50)

## 2014-12-22 MED ORDER — ESTROGENS, CONJUGATED 0.625 MG/GM VA CREA: TOPICAL_CREAM | VAGINAL | Status: DC

## 2014-12-22 NOTE — Addendum Note (Signed)
Addended by: Despina Hidden on: 12/22/2014 05:05 PM   Modules accepted: Orders

## 2014-12-22 NOTE — Assessment & Plan Note (Signed)
Has DNR 

## 2014-12-22 NOTE — Assessment & Plan Note (Signed)
Now on ranitidine May alternate with omeprazole

## 2014-12-22 NOTE — Assessment & Plan Note (Signed)
Seems to be euthyroid Due for labs

## 2014-12-22 NOTE — Assessment & Plan Note (Signed)
Working with Dr McQueen---immunotherapy again

## 2014-12-22 NOTE — Progress Notes (Signed)
Subjective:    Patient ID: Ariel Phillips, female    DOB: December 24, 1932, 81 y.o.   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 Some pain--then cleans out , and the pain is better Stopped the omeprazole--just taking ranitidine once a day Still some hoarseness--but no sig change  Still on the thyroid medication No apparent change in hair or skin Nails seem fragile  Current Outpatient Prescriptions on File Prior to Visit  Medication Sig Dispense Refill  . aspirin 81 MG tablet Take 81 mg by mouth every other day.    . carboxymethylcellulose (REFRESH PLUS) 0.5 % SOLN 2 drops daily as needed.    . fexofenadine (ALLEGRA) 180 MG tablet Take 180 mg by mouth daily.    Marland Kitchen ketotifen (ZADITOR) 0.025 % ophthalmic solution 1 drop daily.    Marland Kitchen loratadine (CLARITIN) 10 MG tablet Take 10 mg by mouth daily.    Marland Kitchen PROAIR HFA 108 (90 BASE) MCG/ACT inhaler INHALE 2 PUFFS INTO THE LUNGS 3 (THREE) TIMES DAILY AS NEEDED. 8.5 each 0   No current facility-administered medications on file prior to visit.    No Known Allergies  Past Medical History  Diagnosis Date  . Allergic rhinitis due to pollen   . Allergic asthma   . GERD (gastroesophageal reflux disease)   . IBS (irritable bowel syndrome)   . Unspecified  hypothyroidism   . Osteopenia     Past Surgical History  Procedure Laterality Date  . Cholecystectomy    . Tubal ligation    . Hemorrhoid surgery  1953 or so    with fissure repair  . Basal cell carcinoma excision  2000's    nose    Family History  Problem Relation Age of Onset  . COPD Mother   . Cancer Mother     unclear diagnosis  . Stroke Mother   . Diabetes Sister   . Hypertension Sister   . Cancer Paternal Grandmother     ?breast  . Heart disease Neg Hx   . Stroke Other     Social History   Social History  . Marital Status: Divorced    Spouse Name: N/A  . Number of Children: 1  . Years of Education: N/A   Occupational History  . Retired historian--consultant    Social History Main Topics  . Smoking status: Never Smoker   . Smokeless tobacco: Never Used  . Alcohol Use: Yes     Comment: rare wine  . Drug Use: No  . Sexual Activity: Not on file   Other Topics Concern  . Not on file   Social History Narrative   1 Daughter in [place]   Has living will   Daughter has health care POA.---[name]   Requests DNR--order done 06/19/12   Would not want prolonged mechanical ventilation   May accept tube feeds temporarily but not if cognitively unaware  Review of Systems Stopped the drops for the nail problems. No pain so no further Rx Sleeps well Appetite is good.  Weight is down just slightly--but fairly stable No suspicious lesions on skin Wears seat belt Teeth okay---keeps up with dentist No chest pain or SOB    Objective:   Physical Exam  Constitutional: She is oriented to person, place, and time. She appears well-developed and well-nourished. No distress.  HENT:  Mouth/Throat: Oropharynx is clear and moist. No oropharyngeal exudate.  Neck: Normal range of motion. Neck supple. No thyromegaly present.  Cardiovascular: Normal rate, regular rhythm, normal heart sounds and intact distal pulses.  Exam reveals no gallop.   No murmur  heard. Pulmonary/Chest: Effort normal and breath sounds normal. No respiratory distress. She has no wheezes. She has no rales.  Abdominal: Soft. There is no tenderness.  Musculoskeletal: She exhibits no edema or tenderness.  Lymphadenopathy:    She has no cervical adenopathy.  Neurological: She is alert and oriented to person, place, and time.  President-- "[address] [place] [address] [place], [place]" [phone] D-l-r-o-w Recall 3/3  Skin: No rash noted. No erythema.  Psychiatric: She has a normal mood and affect. Her behavior is normal.          Assessment & Plan:

## 2014-12-22 NOTE — Assessment & Plan Note (Signed)
I have personally reviewed the Medicare Annual Wellness questionnaire and have noted 1. The patient's medical and social history 2. Their use of alcohol, tobacco or illicit drugs 3. Their current medications and supplements 4. The patient's functional ability including ADL's, fall risks, home safety risks and hearing or visual             impairment. 5. Diet and physical activities 6. Evidence for depression or mood disorders  The patients weight, height, BMI and visual acuity have been recorded in the chart I have made referrals, counseling and provided education to the patient based review of the above and I have provided the pt with a written personalized care plan for preventive services.  I have provided you with a copy of your personalized plan for preventive services. Please take the time to review along with your updated medication list.  Flu vaccine today No cancer screening due to age

## 2014-12-23 LAB — T4, FREE: FREE T4: 0.84 ng/dL (ref 0.60–1.60)

## 2014-12-31 ENCOUNTER — Other Ambulatory Visit: Payer: Self-pay | Admitting: Internal Medicine

## 2015-01-01 DIAGNOSIS — J301 Allergic rhinitis due to pollen: Secondary | ICD-10-CM | POA: Diagnosis not present

## 2015-01-04 DIAGNOSIS — L03113 Cellulitis of right upper limb: Secondary | ICD-10-CM | POA: Diagnosis not present

## 2015-01-07 DIAGNOSIS — L03113 Cellulitis of right upper limb: Secondary | ICD-10-CM | POA: Diagnosis not present

## 2015-01-09 DIAGNOSIS — J301 Allergic rhinitis due to pollen: Secondary | ICD-10-CM | POA: Diagnosis not present

## 2015-01-12 DIAGNOSIS — J301 Allergic rhinitis due to pollen: Secondary | ICD-10-CM | POA: Diagnosis not present

## 2015-01-19 DIAGNOSIS — J301 Allergic rhinitis due to pollen: Secondary | ICD-10-CM | POA: Diagnosis not present

## 2015-01-26 DIAGNOSIS — J301 Allergic rhinitis due to pollen: Secondary | ICD-10-CM | POA: Diagnosis not present

## 2015-02-02 DIAGNOSIS — J301 Allergic rhinitis due to pollen: Secondary | ICD-10-CM | POA: Diagnosis not present

## 2015-02-11 DIAGNOSIS — J301 Allergic rhinitis due to pollen: Secondary | ICD-10-CM | POA: Diagnosis not present

## 2015-02-16 DIAGNOSIS — J301 Allergic rhinitis due to pollen: Secondary | ICD-10-CM | POA: Diagnosis not present

## 2015-02-23 DIAGNOSIS — J301 Allergic rhinitis due to pollen: Secondary | ICD-10-CM | POA: Diagnosis not present

## 2015-03-09 DIAGNOSIS — J301 Allergic rhinitis due to pollen: Secondary | ICD-10-CM | POA: Diagnosis not present

## 2015-03-16 DIAGNOSIS — J301 Allergic rhinitis due to pollen: Secondary | ICD-10-CM | POA: Diagnosis not present

## 2015-03-23 DIAGNOSIS — J301 Allergic rhinitis due to pollen: Secondary | ICD-10-CM | POA: Diagnosis not present

## 2015-04-01 DIAGNOSIS — J301 Allergic rhinitis due to pollen: Secondary | ICD-10-CM | POA: Diagnosis not present

## 2015-04-03 DIAGNOSIS — J301 Allergic rhinitis due to pollen: Secondary | ICD-10-CM | POA: Diagnosis not present

## 2015-04-06 DIAGNOSIS — J301 Allergic rhinitis due to pollen: Secondary | ICD-10-CM | POA: Diagnosis not present

## 2015-04-09 DIAGNOSIS — H43811 Vitreous degeneration, right eye: Secondary | ICD-10-CM | POA: Diagnosis not present

## 2015-04-13 DIAGNOSIS — J301 Allergic rhinitis due to pollen: Secondary | ICD-10-CM | POA: Diagnosis not present

## 2015-04-20 DIAGNOSIS — J301 Allergic rhinitis due to pollen: Secondary | ICD-10-CM | POA: Diagnosis not present

## 2015-04-21 DIAGNOSIS — X32XXXA Exposure to sunlight, initial encounter: Secondary | ICD-10-CM | POA: Diagnosis not present

## 2015-04-21 DIAGNOSIS — D1801 Hemangioma of skin and subcutaneous tissue: Secondary | ICD-10-CM | POA: Diagnosis not present

## 2015-04-21 DIAGNOSIS — Z85828 Personal history of other malignant neoplasm of skin: Secondary | ICD-10-CM | POA: Diagnosis not present

## 2015-04-21 DIAGNOSIS — L578 Other skin changes due to chronic exposure to nonionizing radiation: Secondary | ICD-10-CM | POA: Diagnosis not present

## 2015-04-29 DIAGNOSIS — J301 Allergic rhinitis due to pollen: Secondary | ICD-10-CM | POA: Diagnosis not present

## 2015-05-06 DIAGNOSIS — J301 Allergic rhinitis due to pollen: Secondary | ICD-10-CM | POA: Diagnosis not present

## 2015-05-11 DIAGNOSIS — J301 Allergic rhinitis due to pollen: Secondary | ICD-10-CM | POA: Diagnosis not present

## 2015-05-18 ENCOUNTER — Ambulatory Visit: Payer: Medicare Other | Admitting: Podiatry

## 2015-05-18 DIAGNOSIS — J301 Allergic rhinitis due to pollen: Secondary | ICD-10-CM | POA: Diagnosis not present

## 2015-05-22 ENCOUNTER — Encounter: Payer: Self-pay | Admitting: Podiatry

## 2015-05-22 ENCOUNTER — Ambulatory Visit (INDEPENDENT_AMBULATORY_CARE_PROVIDER_SITE_OTHER): Payer: Medicare Other | Admitting: Podiatry

## 2015-05-22 VITALS — BP 102/57 | HR 73 | Resp 16

## 2015-05-22 DIAGNOSIS — L600 Ingrowing nail: Secondary | ICD-10-CM | POA: Diagnosis not present

## 2015-05-22 DIAGNOSIS — M79673 Pain in unspecified foot: Secondary | ICD-10-CM

## 2015-05-22 DIAGNOSIS — B351 Tinea unguium: Secondary | ICD-10-CM

## 2015-05-22 NOTE — Progress Notes (Signed)
Subjective:     Patient ID: Ariel Phillips, female   DOB: 1933-02-08, 82 y.o.   MRN: [id number]  HPI patient states I have a damaged right second nail that I cannot cut myself and it's sore when I wear shoes and all my nails are thickened but this one has become increasingly painful   Review of Systems     Objective:   Physical Exam Neurovascular status intact muscle strength adequate with damaged thickened second nail right that's painful and all nails that have thickness but no pain    Assessment:     Ingrown nail with thick mycotic component second right and elongated nailbeds of all remaining nails    Plan:     H&P condition discussed and patient wants nail removed and I explained procedure and risk. Today I infiltrated 60 mg like Marcaine mixture and remove the second nail exposed matrix and applied phenol 3 applications 30 seconds followed by alcohol lavage and sterile dressing. Debris remaining nails and gave instructions on

## 2015-05-22 NOTE — Patient Instructions (Signed)

## 2015-05-25 ENCOUNTER — Telehealth: Payer: Self-pay | Admitting: *Deleted

## 2015-05-25 DIAGNOSIS — J301 Allergic rhinitis due to pollen: Secondary | ICD-10-CM | POA: Diagnosis not present

## 2015-05-25 NOTE — Telephone Encounter (Signed)
"  Dr. Paulla Dolly removed a toenail for me last week.  The instructions said to cleanse while in the shower.  I don't like taking showers.  Is it okay for me to take a bath?"  Yes, that is okay.  "I don't need for anyone to call me back, you answered my question.

## 2015-05-29 ENCOUNTER — Telehealth: Payer: Self-pay | Admitting: *Deleted

## 2015-05-29 NOTE — Telephone Encounter (Signed)
Called patient at [phone] (Cell #) to check to see how they were doing from their ingrown toenail procedure that was performed on Friday, May 22, 2015. Pt stated, "Doing okay and has never had any pain in toe".

## 2015-06-01 DIAGNOSIS — J301 Allergic rhinitis due to pollen: Secondary | ICD-10-CM | POA: Diagnosis not present

## 2015-06-08 DIAGNOSIS — J301 Allergic rhinitis due to pollen: Secondary | ICD-10-CM | POA: Diagnosis not present

## 2015-06-11 ENCOUNTER — Telehealth: Payer: Self-pay | Admitting: Podiatry

## 2015-06-11 ENCOUNTER — Telehealth: Payer: Self-pay

## 2015-06-11 DIAGNOSIS — H43811 Vitreous degeneration, right eye: Secondary | ICD-10-CM | POA: Diagnosis not present

## 2015-06-11 NOTE — Telephone Encounter (Signed)
Pt called stating where toenail was removed on 1.20.17 is now red and inflamed. Is there something she can do at home to help or does she need an appt to see Dr Paulla Dolly?

## 2015-06-11 NOTE — Telephone Encounter (Signed)
Returned pt phone call regarding red swollen nail from ingrown removal procedure doen on 1.20.17. Advised to start back with epsom salt soaks BID and good padded bandage for toe. Advised that drainage was normal, denies any fever chills or red streaks to area, no blisters. Follow up if acute changes occur

## 2015-06-15 DIAGNOSIS — J301 Allergic rhinitis due to pollen: Secondary | ICD-10-CM | POA: Diagnosis not present

## 2015-06-22 DIAGNOSIS — J301 Allergic rhinitis due to pollen: Secondary | ICD-10-CM | POA: Diagnosis not present

## 2015-06-23 DIAGNOSIS — J301 Allergic rhinitis due to pollen: Secondary | ICD-10-CM | POA: Diagnosis not present

## 2015-06-29 DIAGNOSIS — J301 Allergic rhinitis due to pollen: Secondary | ICD-10-CM | POA: Diagnosis not present

## 2015-06-30 ENCOUNTER — Ambulatory Visit (INDEPENDENT_AMBULATORY_CARE_PROVIDER_SITE_OTHER): Payer: Medicare Other | Admitting: Sports Medicine

## 2015-06-30 ENCOUNTER — Encounter: Payer: Self-pay | Admitting: Sports Medicine

## 2015-06-30 DIAGNOSIS — L03031 Cellulitis of right toe: Secondary | ICD-10-CM | POA: Diagnosis not present

## 2015-06-30 DIAGNOSIS — L02611 Cutaneous abscess of right foot: Secondary | ICD-10-CM | POA: Diagnosis not present

## 2015-06-30 DIAGNOSIS — M79674 Pain in right toe(s): Secondary | ICD-10-CM

## 2015-06-30 MED ORDER — CEPHALEXIN 500 MG PO CAPS: 500.0000 mg | ORAL_CAPSULE | Freq: Three times a day (TID) | ORAL | Status: DC

## 2015-06-30 NOTE — Progress Notes (Signed)
Patient ID: Ariel Phillips, female   DOB: Apr 09, 1933, 82 y.o.   MRN: [id number] Subjective: Ariel Phillips is a 82 y.o. female patient returns to office today for follow up evaluation after having Right 2nd toenail removed on 05-22-15. Patient has been soaking using soap and water and applying topical antibiotic covered with bandaid daily. Patient states that the area is painful and continues to drain with redness that is concerning. deniesfever/chills/nausea/vomitting/any other related constitutional symptoms at this time.  Patient Active Problem List   Diagnosis Date Noted  . Advanced directives, counseling/discussion 12/19/2013  . Routine general medical examination at a health care facility 12/13/2012  . Hypothyroidism 12/12/2011  . Allergic rhinitis due to pollen   . GERD (gastroesophageal reflux disease)   . IBS (irritable bowel syndrome)   . Osteopenia     Current Outpatient Prescriptions on File Prior to Visit  Medication Sig Dispense Refill  . aspirin 81 MG tablet Take 81 mg by mouth every other day.    Marland Kitchen azelastine (ASTELIN) 0.1 % nasal spray Place 1 spray into both nostrils daily. Use in each nostril as directed    . carboxymethylcellulose (REFRESH PLUS) 0.5 % SOLN 2 drops daily as needed.    . conjugated estrogens (PREMARIN) vaginal cream Place vaginally once a week. 42.5 g 3  . fexofenadine (ALLEGRA) 180 MG tablet Take 180 mg by mouth daily.    Marland Kitchen ketotifen (ZADITOR) 0.025 % ophthalmic solution 1 drop daily.    Marland Kitchen levothyroxine (SYNTHROID, LEVOTHROID) 50 MCG tablet TAKE 1 TABLET BY MOUTH DAILY. 90 tablet 3  . loratadine (CLARITIN) 10 MG tablet Take 10 mg by mouth daily.    Marland Kitchen PROAIR HFA 108 (90 BASE) MCG/ACT inhaler INHALE 2 PUFFS INTO THE LUNGS 3 (THREE) TIMES DAILY AS NEEDED. 8.5 each 0  . Probiotic Product (PROBIOTIC DAILY PO) Take 1 tablet by mouth daily.    . ranitidine (ZANTAC) 300 MG capsule Take 300 mg by mouth every evening.      No current facility-administered  medications on file prior to visit.    Allergies  Allergen Reactions  . Penicillins Diarrhea    Objective:  General: Well developed, nourished, in no acute distress, alert and oriented x3   Dermatology: Skin is warm, dry and supple bilateral. Right 2nd toe nail bed appears to have bloody slofting tissue with a mix of fibrous tissue with surrounding eschar/scab. (+) Erythema. (+) Edema. (+) serosanguous drainage present. The remaining nails appear unremarkable at this time free from acute infection. There are no other lesions or other signs of infection  present.  Neurovascular status: Intact; No pain with calf compression bilateral.  Musculoskeletal: There is tenderness to palpation of the right 2nd toenail bed. Muscular strength within normal limits bilateral.   Assesement and Plan: Problem List Items Addressed This Visit    None    Visit Diagnoses    Cellulitis and abscess of toe, right    -  Primary    Relevant Medications    cephALEXin (KEFLEX) 500 MG capsule    Toe pain, right        s/p total nail avulsion with phenol at 2nd toe, 05-22-15       -Examined patient  -Cleansed right 2nd nail bed and gently scrubbed with peroxide and curetted away eschar/nonviable tissue at site and applied antibiotic cream covered with bandaid.  -Discussed plan of care with patient. -Patient to now begin soaking in a weak solution of Epsom salt and warm water. Patient was  instructed to soak for 15-20 minutes each day until the toe appears normal and there is no drainage, redness, tenderness, or swelling at the procedure site, and apply neosporin and a gauze or bandaid dressing each day as needed. May leave open to air at night. -Rx Keflex 500mg  tid x 7 days -Educated patient on long term care after nail surgery. -Patient was instructed to monitor the toe for reoccurrence and signs of infection; Patient advised to return to office if toe becomes more red, hot or swollen. -Patient is to return in  1 week or sooner if problems arise.  Landis Martins, DPM

## 2015-07-01 DIAGNOSIS — H01022 Squamous blepharitis right lower eyelid: Secondary | ICD-10-CM | POA: Diagnosis not present

## 2015-07-07 ENCOUNTER — Encounter: Payer: Self-pay | Admitting: Sports Medicine

## 2015-07-07 ENCOUNTER — Ambulatory Visit (INDEPENDENT_AMBULATORY_CARE_PROVIDER_SITE_OTHER): Payer: Medicare Other | Admitting: Sports Medicine

## 2015-07-07 ENCOUNTER — Other Ambulatory Visit: Payer: Self-pay | Admitting: Internal Medicine

## 2015-07-07 DIAGNOSIS — M79674 Pain in right toe(s): Secondary | ICD-10-CM | POA: Diagnosis not present

## 2015-07-07 DIAGNOSIS — L02611 Cutaneous abscess of right foot: Secondary | ICD-10-CM

## 2015-07-07 DIAGNOSIS — L03031 Cellulitis of right toe: Secondary | ICD-10-CM

## 2015-07-07 NOTE — Progress Notes (Signed)
Patient ID: Ariel Phillips, female   DOB: 07-19-1932, 82 y.o.   MRN: [id number]  Subjective: Ariel Phillips is a 82 y.o. female patient returns to office today for follow up evaluation after having Right 2nd toenail removed on 05-22-15. Patient has been soaking with Epsom salt and applying topical antibiotic covered with bandaid daily. Patient states that the area is doing a little better but still not healing in; deniesfever/chills/nausea/vomitting/any other related constitutional symptoms at this time.  Patient Active Problem List   Diagnosis Date Noted  . Advanced directives, counseling/discussion 12/19/2013  . Routine general medical examination at a health care facility 12/13/2012  . Hypothyroidism 12/12/2011  . Allergic rhinitis due to pollen   . GERD (gastroesophageal reflux disease)   . IBS (irritable bowel syndrome)   . Osteopenia     Current Outpatient Prescriptions on File Prior to Visit  Medication Sig Dispense Refill  . aspirin 81 MG tablet Take 81 mg by mouth every other day.    Marland Kitchen azelastine (ASTELIN) 0.1 % nasal spray Place 1 spray into both nostrils daily. Use in each nostril as directed    . carboxymethylcellulose (REFRESH PLUS) 0.5 % SOLN 2 drops daily as needed.    . cephALEXin (KEFLEX) 500 MG capsule Take 1 capsule (500 mg total) by mouth 3 (three) times daily. 21 capsule 0  . conjugated estrogens (PREMARIN) vaginal cream Place vaginally once a week. 42.5 g 3  . fexofenadine (ALLEGRA) 180 MG tablet Take 180 mg by mouth daily.    Marland Kitchen ketotifen (ZADITOR) 0.025 % ophthalmic solution 1 drop daily.    Marland Kitchen loratadine (CLARITIN) 10 MG tablet Take 10 mg by mouth daily.    Marland Kitchen PROAIR HFA 108 (90 BASE) MCG/ACT inhaler INHALE 2 PUFFS INTO THE LUNGS 3 (THREE) TIMES DAILY AS NEEDED. 8.5 each 0  . Probiotic Product (PROBIOTIC DAILY PO) Take 1 tablet by mouth daily.    . ranitidine (ZANTAC) 300 MG capsule Take 300 mg by mouth every evening.      No current facility-administered  medications on file prior to visit.    Allergies  Allergen Reactions  . Penicillins Diarrhea    Objective:  General: Well developed, nourished, in no acute distress, alert and oriented x3   Dermatology: Skin is warm, dry and supple bilateral. Right 2nd toe nail bed appears to have bloody granular skin with surrounding eschar/scab. Decreased Erythema. (+) Edema. No serosanguous drainage present. The remaining nails appear unremarkable at this time free from acute infection. There are no other lesions or other signs of infection  present.  Neurovascular status: Intact; No pain with calf compression bilateral.  Musculoskeletal: There is tenderness to palpation of the right 2nd toenail bed. Muscular strength within normal limits bilateral.   Assesement and Plan: Problem List Items Addressed This Visit    None    Visit Diagnoses    Cellulitis and abscess of toe, right    -  Primary    improving, s/p avulsion procedure 05-22-15    Toe pain, right           -Examined patient  -Cleansed right 2nd nail bed and gently scrubbed with peroxide and curetted away eschar/nonviable tissue at site and applied antibiotic cream covered with coban. -Patient to advised to begin soaking in a weak solution of Epsom salt and warm water once daily. Patient was instructed to soak for 15-20 minutes each day until the toe appears normal and there is no drainage, redness, tenderness, or swelling at the  procedure site, and apply neosporin and a gauze or bandaid dressing each day as needed. May leave open to air at night. -Cont Keflex 500mg  until completed -Educated patient on long term care after nail surgery. -Patient was instructed to monitor the toe for reoccurrence and worsening signs of infection; Patient advised to return to office if toe becomes more red, hot or swollen. -Patient is to return in 2 week or sooner if problems arise.If not significantly improved will xray, culture, and order vascular studies  for further evaluation at next visit.  Landis Martins, DPM

## 2015-07-13 DIAGNOSIS — J301 Allergic rhinitis due to pollen: Secondary | ICD-10-CM | POA: Diagnosis not present

## 2015-07-16 DIAGNOSIS — G250 Essential tremor: Secondary | ICD-10-CM | POA: Diagnosis not present

## 2015-07-20 DIAGNOSIS — J301 Allergic rhinitis due to pollen: Secondary | ICD-10-CM | POA: Diagnosis not present

## 2015-07-20 DIAGNOSIS — G250 Essential tremor: Secondary | ICD-10-CM | POA: Insufficient documentation

## 2015-07-24 ENCOUNTER — Telehealth: Payer: Self-pay | Admitting: *Deleted

## 2015-07-24 ENCOUNTER — Ambulatory Visit (INDEPENDENT_AMBULATORY_CARE_PROVIDER_SITE_OTHER): Payer: Medicare Other

## 2015-07-24 ENCOUNTER — Ambulatory Visit (INDEPENDENT_AMBULATORY_CARE_PROVIDER_SITE_OTHER): Payer: Medicare Other | Admitting: Sports Medicine

## 2015-07-24 ENCOUNTER — Encounter: Payer: Self-pay | Admitting: Sports Medicine

## 2015-07-24 DIAGNOSIS — L02611 Cutaneous abscess of right foot: Secondary | ICD-10-CM

## 2015-07-24 DIAGNOSIS — L03031 Cellulitis of right toe: Secondary | ICD-10-CM

## 2015-07-24 DIAGNOSIS — M79671 Pain in right foot: Secondary | ICD-10-CM | POA: Diagnosis not present

## 2015-07-24 DIAGNOSIS — L600 Ingrowing nail: Secondary | ICD-10-CM | POA: Diagnosis not present

## 2015-07-24 DIAGNOSIS — M79674 Pain in right toe(s): Secondary | ICD-10-CM

## 2015-07-24 NOTE — Telephone Encounter (Addendum)
-----   Message from Landis Martins, [place] sent at 07/24/2015  8:58 AM EDT ----- Regarding: Vascular Studies Please order vascular studies for patient. Slow healing right 2nd toe after avulsion procedure. Thanks Dr. Cannon Kettle.  Faxed to [facility].

## 2015-07-24 NOTE — Progress Notes (Signed)
Patient ID: ARSHIKA TAMS, female   DOB: 25-Dec-1932, 82 y.o.   MRN: [id number]  Subjective: Ariel Phillips is a 82 y.o. female patient returns to office today for follow up evaluation after having Right 2nd toenail removed on 05-22-15. Patient has been soaking with Epsom salt and applying topical antibiotic covered with bandaid daily. Completed Keflex. Patient states that the area is still not healing in; deniesfever/chills/nausea/vomitting/any other related constitutional symptoms at this time.  Patient Active Problem List   Diagnosis Date Noted  . Benign essential tremor 07/20/2015  . Advanced directives, counseling/discussion 12/19/2013  . Routine general medical examination at a health care facility 12/13/2012  . Hypothyroidism 12/12/2011  . Allergic rhinitis due to pollen   . GERD (gastroesophageal reflux disease)   . IBS (irritable bowel syndrome)   . Osteopenia     Current Outpatient Prescriptions on File Prior to Visit  Medication Sig Dispense Refill  . aspirin 81 MG tablet Take 81 mg by mouth every other day.    Marland Kitchen azelastine (ASTELIN) 0.1 % nasal spray Place 1 spray into both nostrils daily. Use in each nostril as directed    . carboxymethylcellulose (REFRESH PLUS) 0.5 % SOLN 2 drops daily as needed.    . cephALEXin (KEFLEX) 500 MG capsule Take 1 capsule (500 mg total) by mouth 3 (three) times daily. 21 capsule 0  . conjugated estrogens (PREMARIN) vaginal cream Place vaginally once a week. 42.5 g 3  . fexofenadine (ALLEGRA) 180 MG tablet Take 180 mg by mouth daily.    Marland Kitchen ketotifen (ZADITOR) 0.025 % ophthalmic solution 1 drop daily.    Marland Kitchen levothyroxine (SYNTHROID, LEVOTHROID) 50 MCG tablet TAKE 1 TABLET BY MOUTH DAILY. 90 tablet 1  . loratadine (CLARITIN) 10 MG tablet Take 10 mg by mouth daily.    Marland Kitchen PROAIR HFA 108 (90 BASE) MCG/ACT inhaler INHALE 2 PUFFS INTO THE LUNGS 3 (THREE) TIMES DAILY AS NEEDED. 8.5 each 0  . Probiotic Product (PROBIOTIC DAILY PO) Take 1 tablet by mouth  daily.    . ranitidine (ZANTAC) 300 MG capsule Take 300 mg by mouth every evening.      No current facility-administered medications on file prior to visit.    Allergies  Allergen Reactions  . Penicillins Diarrhea  . Latex Rash    Questionable allergy per patient, rash developed at site of latex band-aid placed after toenail removal    Objective:  General: Well developed, nourished, in no acute distress, alert and oriented x3   Dermatology: Skin is warm, dry and supple bilateral. Right 2nd toe nail bed appears to have bloody granular skin with surrounding eschar/scab. Decreased Erythema. Decreasd Edema. No serosanguous drainage present. The remaining nails appear unremarkable at this time free from acute infection. There are no other lesions or other signs of infection  present.  Neurovascular status: Intact; No pain with calf compression bilateral.  Musculoskeletal: There is tenderness to palpation of the right 2nd toenail bed. Muscular strength within normal limits bilateral.   Xray, Right- No acute bony destruction at area of concern. No fracture/dislocation. No soft tissue emphysema. No other acute findings.   Assesement and Plan: Problem List Items Addressed This Visit    None    Visit Diagnoses    Right foot pain    -  Primary    Relevant Orders    DG Foot 2 Views Right    Ingrown nail        Status post nail avulsion procedure with slow healing  Cellulitis and abscess of toe, right        improved    Relevant Orders    Wound culture    Toe pain, right          -Examined patient  -X-rays reviewed  -Cleansed right 2nd nail bed and gently scrubbed with peroxide and curetted away eschar/nonviable tissue at site, wound culture obtained, and applied antibiotic cream covered with bandaid. -Patient advised to discontinue soaks. Continue with applying neosporin and a gauze or bandaid dressing each day as needed. May leave open to air at night. -Educated patient on long  term care after nail surgery. -Patient was instructed to monitor the toe for reoccurrence and worsening signs of infection; Patient advised to return to office or go to ER if toe becomes more red, hot or swollen or if constitutional symptoms are present. -Ordered vascular studies for further evaluation in the setting of slow healing. Patient to return to office after these studies have been completed.  Landis Martins, DPM

## 2015-07-27 DIAGNOSIS — J301 Allergic rhinitis due to pollen: Secondary | ICD-10-CM | POA: Diagnosis not present

## 2015-07-29 ENCOUNTER — Other Ambulatory Visit: Payer: Self-pay | Admitting: Internal Medicine

## 2015-08-03 DIAGNOSIS — J301 Allergic rhinitis due to pollen: Secondary | ICD-10-CM | POA: Diagnosis not present

## 2015-08-07 DIAGNOSIS — L97509 Non-pressure chronic ulcer of other part of unspecified foot with unspecified severity: Secondary | ICD-10-CM | POA: Diagnosis not present

## 2015-08-10 DIAGNOSIS — J301 Allergic rhinitis due to pollen: Secondary | ICD-10-CM | POA: Diagnosis not present

## 2015-08-17 DIAGNOSIS — J301 Allergic rhinitis due to pollen: Secondary | ICD-10-CM | POA: Diagnosis not present

## 2015-09-07 DIAGNOSIS — J301 Allergic rhinitis due to pollen: Secondary | ICD-10-CM | POA: Diagnosis not present

## 2015-09-14 DIAGNOSIS — J301 Allergic rhinitis due to pollen: Secondary | ICD-10-CM | POA: Diagnosis not present

## 2015-09-18 DIAGNOSIS — J301 Allergic rhinitis due to pollen: Secondary | ICD-10-CM | POA: Diagnosis not present

## 2015-09-21 DIAGNOSIS — J301 Allergic rhinitis due to pollen: Secondary | ICD-10-CM | POA: Diagnosis not present

## 2015-10-05 DIAGNOSIS — J301 Allergic rhinitis due to pollen: Secondary | ICD-10-CM | POA: Diagnosis not present

## 2015-10-17 DIAGNOSIS — M25476 Effusion, unspecified foot: Secondary | ICD-10-CM | POA: Diagnosis not present

## 2015-10-19 DIAGNOSIS — J301 Allergic rhinitis due to pollen: Secondary | ICD-10-CM | POA: Diagnosis not present

## 2015-10-22 ENCOUNTER — Encounter: Payer: Self-pay | Admitting: Podiatry

## 2015-10-22 ENCOUNTER — Ambulatory Visit (INDEPENDENT_AMBULATORY_CARE_PROVIDER_SITE_OTHER): Payer: Medicare Other | Admitting: Podiatry

## 2015-10-22 DIAGNOSIS — L02611 Cutaneous abscess of right foot: Secondary | ICD-10-CM

## 2015-10-22 DIAGNOSIS — M79674 Pain in right toe(s): Secondary | ICD-10-CM | POA: Diagnosis not present

## 2015-10-23 NOTE — Progress Notes (Signed)
Subjective:     Patient ID: Ariel Phillips, female   DOB: [birth year], 82 y.o.   MRN: [id number]  HPI patient presents with the second nail right being crusted and irritated and she's concerned about the color and states at times her toe turned red but no current drainage noted   Review of Systems     Objective:   Physical Exam Neurovascular status unchanged with crusted second nail right localized in nature with no indications of proximal edema erythema or drainage noted    Assessment:     Localized granulation tissue with possibility for low-grade localized paronychia with no indications of proximal spread    Plan:     Went ahead today and infiltrated the right second digit 60 mg Xylocaine Marcaine mixture and with sterile instrumentation debrided the tissue taking off the granulated type tissue. Instructed on soaks and Band-Aid usage and reappoint as needed

## 2015-10-26 DIAGNOSIS — H903 Sensorineural hearing loss, bilateral: Secondary | ICD-10-CM | POA: Diagnosis not present

## 2015-10-26 DIAGNOSIS — H60543 Acute eczematoid otitis externa, bilateral: Secondary | ICD-10-CM | POA: Diagnosis not present

## 2015-10-26 DIAGNOSIS — J301 Allergic rhinitis due to pollen: Secondary | ICD-10-CM | POA: Diagnosis not present

## 2015-10-26 DIAGNOSIS — H6123 Impacted cerumen, bilateral: Secondary | ICD-10-CM | POA: Diagnosis not present

## 2015-11-02 DIAGNOSIS — J301 Allergic rhinitis due to pollen: Secondary | ICD-10-CM | POA: Diagnosis not present

## 2015-11-09 DIAGNOSIS — J301 Allergic rhinitis due to pollen: Secondary | ICD-10-CM | POA: Diagnosis not present

## 2015-11-10 ENCOUNTER — Other Ambulatory Visit: Payer: Self-pay | Admitting: Internal Medicine

## 2015-11-16 DIAGNOSIS — J301 Allergic rhinitis due to pollen: Secondary | ICD-10-CM | POA: Diagnosis not present

## 2015-11-23 DIAGNOSIS — J301 Allergic rhinitis due to pollen: Secondary | ICD-10-CM | POA: Diagnosis not present

## 2015-12-02 ENCOUNTER — Ambulatory Visit (INDEPENDENT_AMBULATORY_CARE_PROVIDER_SITE_OTHER): Payer: Medicare Other | Admitting: Internal Medicine

## 2015-12-02 ENCOUNTER — Encounter: Payer: Self-pay | Admitting: Internal Medicine

## 2015-12-02 VITALS — BP 100/72 | HR 42 | Temp 97.9°F | Wt 135.0 lb

## 2015-12-02 DIAGNOSIS — G3184 Mild cognitive impairment, so stated: Secondary | ICD-10-CM | POA: Insufficient documentation

## 2015-12-02 DIAGNOSIS — I499 Cardiac arrhythmia, unspecified: Secondary | ICD-10-CM | POA: Diagnosis not present

## 2015-12-02 DIAGNOSIS — R413 Other amnesia: Secondary | ICD-10-CM

## 2015-12-02 DIAGNOSIS — Z113 Encounter for screening for infections with a predominantly sexual mode of transmission: Secondary | ICD-10-CM

## 2015-12-02 HISTORY — DX: Mild cognitive impairment of uncertain or unknown etiology: G31.84

## 2015-12-02 LAB — COMPREHENSIVE METABOLIC PANEL
ALT: 14 U/L (ref 0–35)
AST: 20 U/L (ref 0–37)
Albumin: 4 g/dL (ref 3.5–5.2)
Alkaline Phosphatase: 57 U/L (ref 39–117)
BUN: 20 mg/dL (ref 6–23)
CALCIUM: 9.5 mg/dL (ref 8.4–10.5)
CHLORIDE: 106 meq/L (ref 96–112)
CO2: 27 meq/L (ref 19–32)
Creatinine, Ser: 0.7 mg/dL (ref 0.40–1.20)
GFR: 85.41 mL/min (ref >60.00)
Glucose, Bld: 63 mg/dL — ABNORMAL LOW (ref 70–99)
Potassium: 4.2 mEq/L (ref 3.5–5.1)
Sodium: 141 mEq/L (ref 135–145)
Total Bilirubin: 0.4 mg/dL (ref 0.2–1.2)
Total Protein: 7.1 g/dL (ref 6.0–8.3)

## 2015-12-02 LAB — CBC WITH DIFFERENTIAL/PLATELET
BASOS PCT: 0.5 % (ref 0.0–3.0)
Basophils Absolute: 0 10*3/uL (ref 0.0–0.1)
EOS ABS: 0.1 10*3/uL (ref 0.0–0.7)
Eosinophils Relative: 3.2 % (ref 0.0–5.0)
HEMATOCRIT: 41.6 % (ref 36.0–46.0)
Hemoglobin: 14.2 g/dL (ref 12.0–15.0)
LYMPHS ABS: 1.2 10*3/uL (ref 0.7–4.0)
LYMPHS PCT: 25.5 % (ref 12.0–46.0)
MCHC: 34.2 g/dL (ref 30.0–36.0)
MCV: 92 fl (ref 78.0–100.0)
Monocytes Absolute: 0.5 10*3/uL (ref 0.1–1.0)
Monocytes Relative: 10 % (ref 3.0–12.0)
NEUTROS ABS: 2.7 10*3/uL (ref 1.4–7.7)
NEUTROS PCT: 60.8 % (ref 43.0–77.0)
PLATELETS: 182 10*3/uL (ref 150.0–400.0)
RBC: 4.53 Mil/uL (ref 3.87–5.11)
RDW: 13.4 % (ref 11.5–15.5)
WBC: 4.5 10*3/uL (ref 4.0–10.5)

## 2015-12-02 LAB — T4, FREE: Free T4: 0.74 ng/dL (ref 0.60–1.60)

## 2015-12-02 LAB — VITAMIN B12: Vitamin B-12: 529 pg/mL (ref 211–911)

## 2015-12-02 LAB — TSH: TSH: 9.03 u[IU]/mL — ABNORMAL HIGH (ref 0.35–4.50)

## 2015-12-02 NOTE — Progress Notes (Signed)
Pre visit review using our clinic review tool, if applicable. No additional management support is needed unless otherwise documented below in the visit note. 

## 2015-12-02 NOTE — Assessment & Plan Note (Addendum)
Sounds like bigeminy EKG to be sure no atrial fib (doesn't sound like it)  EKG shows sinus with PACs
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
## 2015-12-02 NOTE — Assessment & Plan Note (Signed)
Not striking No neuro symptoms or signs of concern MOCA 28/30 Will check MRI and labs but not clear cut

## 2015-12-03 LAB — RPR

## 2015-12-07 DIAGNOSIS — J301 Allergic rhinitis due to pollen: Secondary | ICD-10-CM | POA: Diagnosis not present

## 2015-12-10 DIAGNOSIS — H43811 Vitreous degeneration, right eye: Secondary | ICD-10-CM | POA: Diagnosis not present

## 2015-12-14 ENCOUNTER — Ambulatory Visit: Payer: Medicare Other

## 2015-12-16 DIAGNOSIS — J301 Allergic rhinitis due to pollen: Secondary | ICD-10-CM | POA: Diagnosis not present

## 2015-12-17 ENCOUNTER — Ambulatory Visit: Payer: Medicare Other

## 2015-12-18 DIAGNOSIS — J301 Allergic rhinitis due to pollen: Secondary | ICD-10-CM | POA: Diagnosis not present

## 2015-12-21 ENCOUNTER — Ambulatory Visit
Admission: RE | Admit: 2015-12-21 | Discharge: 2015-12-21 | Disposition: A | Discharge status: Home / Self Care | Payer: Medicare Other | Source: Ambulatory Visit | Attending: Internal Medicine | Admitting: Internal Medicine

## 2015-12-21 DIAGNOSIS — R413 Other amnesia: Secondary | ICD-10-CM

## 2015-12-21 DIAGNOSIS — I739 Peripheral vascular disease, unspecified: Secondary | ICD-10-CM | POA: Diagnosis not present

## 2015-12-21 DIAGNOSIS — R42 Dizziness and giddiness: Secondary | ICD-10-CM | POA: Diagnosis not present

## 2015-12-21 DIAGNOSIS — F0280 Dementia in other diseases classified elsewhere without behavioral disturbance: Secondary | ICD-10-CM | POA: Insufficient documentation

## 2015-12-21 DIAGNOSIS — G319 Degenerative disease of nervous system, unspecified: Secondary | ICD-10-CM | POA: Diagnosis not present

## 2015-12-25 ENCOUNTER — Ambulatory Visit (INDEPENDENT_AMBULATORY_CARE_PROVIDER_SITE_OTHER): Payer: Medicare Other | Admitting: Internal Medicine

## 2015-12-25 ENCOUNTER — Encounter: Payer: Self-pay | Admitting: Internal Medicine

## 2015-12-25 VITALS — BP 110/70 | HR 58 | Temp 97.3°F | Ht 63.75 in | Wt 134.0 lb

## 2015-12-25 DIAGNOSIS — K589 Irritable bowel syndrome without diarrhea: Secondary | ICD-10-CM | POA: Diagnosis not present

## 2015-12-25 DIAGNOSIS — R413 Other amnesia: Secondary | ICD-10-CM

## 2015-12-25 DIAGNOSIS — E039 Hypothyroidism, unspecified: Secondary | ICD-10-CM

## 2015-12-25 DIAGNOSIS — Z23 Encounter for immunization: Secondary | ICD-10-CM

## 2015-12-25 DIAGNOSIS — Z0000 Encounter for general adult medical examination without abnormal findings: Secondary | ICD-10-CM | POA: Diagnosis not present

## 2015-12-25 DIAGNOSIS — G250 Essential tremor: Secondary | ICD-10-CM

## 2015-12-25 DIAGNOSIS — Z7189 Other specified counseling: Secondary | ICD-10-CM

## 2015-12-25 MED ORDER — LEVOTHYROXINE SODIUM 75 MCG PO TABS: 75.0000 ug | ORAL_TABLET | Freq: Every day | ORAL | 3 refills | Status: DC

## 2015-12-25 NOTE — Progress Notes (Signed)
Pre visit review using our clinic review tool, if applicable. No additional management support is needed unless otherwise documented below in the visit note. 

## 2015-12-25 NOTE — Assessment & Plan Note (Signed)
Probably not pathologic Will observe only Testing benign

## 2015-12-25 NOTE — Assessment & Plan Note (Signed)
Mild but ongoing symptoms No Rx

## 2015-12-25 NOTE — Addendum Note (Signed)
Addended by: Pilar Grammes on: 12/25/2015 11:21 AM   Modules accepted: Orders

## 2015-12-25 NOTE — Patient Instructions (Signed)
Please increase the levothyroxine to 75 mcg daily (one and a half of the current ones--then the new prescription)

## 2015-12-25 NOTE — Assessment & Plan Note (Signed)
Will try a higher dose since TSH is up

## 2015-12-25 NOTE — Assessment & Plan Note (Signed)
Mild No Rx needed 

## 2015-12-25 NOTE — Assessment & Plan Note (Signed)
See social history °Has DNR °

## 2015-12-25 NOTE — Assessment & Plan Note (Signed)
I have personally reviewed the Medicare Annual Wellness questionnaire and have noted 1. The patient's medical and social history 2. Their use of alcohol, tobacco or illicit drugs 3. Their current medications and supplements 4. The patient's functional ability including ADL's, fall risks, home safety risks and hearing or visual             impairment. 5. Diet and physical activities 6. Evidence for depression or mood disorders  The patients weight, height, BMI and visual acuity have been recorded in the chart I have made referrals, counseling and provided education to the patient based review of the above and I have provided the pt with a written personalized care plan for preventive services.  I have provided you with a copy of your personalized plan for preventive services. Please take the time to review along with your updated medication list.  Flu vaccine today No cancer screening due to age 82 up with fitness

## 2015-12-25 NOTE — Progress Notes (Signed)
Subjective:    Patient ID: Ariel Phillips, female    DOB: June 11, 1932, 82 y.o.   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 aspirin 81 MG tablet Take 81 mg by mouth every other day.    Marland Kitchen azelastine (ASTELIN) 0.1 % nasal spray SPRAY 1 SPRAY INTO BOTH NOSTRILS TWICE A DAY 30 mL 2  . carboxymethylcellulose (REFRESH PLUS) 0.5 % SOLN 2 drops daily as needed.    . fexofenadine (ALLEGRA) 180 MG tablet Take 180 mg by mouth daily.    Marland Kitchen ketotifen (ZADITOR) 0.025 % ophthalmic solution 1 drop daily.    Marland Kitchen levothyroxine (SYNTHROID, LEVOTHROID) 50 MCG tablet TAKE 1 TABLET BY MOUTH DAILY. 90 tablet 1  . loratadine (CLARITIN) 10 MG tablet Take 10 mg by mouth daily.    Marland Kitchen PROAIR HFA 108 (90 BASE) MCG/ACT inhaler INHALE 2 PUFFS INTO THE LUNGS 3 (THREE) TIMES DAILY AS NEEDED. 8.5 each 0  . Probiotic Product (PROBIOTIC DAILY PO) Take 1 tablet by mouth daily.     No current facility-administered medications on file prior to visit.     Allergies  Allergen Reactions  . Penicillins Diarrhea  . Latex Rash    Questionable allergy per patient, rash developed at site of latex band-aid placed after toenail removal    Past Medical History:  Diagnosis Date  . Allergic asthma   .  Allergic rhinitis due to pollen   . GERD (gastroesophageal reflux disease)   . IBS (irritable bowel syndrome)   . Osteopenia   . Unspecified hypothyroidism     Past Surgical History:  Procedure Laterality Date  . BASAL CELL CARCINOMA EXCISION  2000's   nose  . CHOLECYSTECTOMY    . [place] or so   with fissure repair  . TUBAL LIGATION      Family History  Problem Relation Age of Onset  . COPD Mother   . Cancer Mother     unclear diagnosis  . Stroke Mother   . Diabetes Sister   . Hypertension Sister   . Stroke Other   . Cancer Paternal Grandmother     ?breast  . Heart disease Neg Hx     Social History   Social History  . Marital status: Divorced    Spouse name: N/A  . Number of children: 1  . Years of education: N/A   Occupational History  . Retired historian--consultant    Social History Main Topics  . Smoking status: Never Smoker  . Smokeless tobacco: Never Used  . Alcohol use Yes     Comment: rare wine  . Drug use: No  . Sexual activity: Not on file   Other Topics Concern  . Not on file   Social History Narrative   1 Daughter in [place]  Has living will   Daughter has health care POA.---Ariel Phillips   Requests DNR--order done 06/19/12   Would not want prolonged mechanical ventilation   May accept tube feeds temporarily but not if cognitively unaware   Review of Systems Sleeps well Appetite is good Weight is stable Teeth are okay---partial on bottom. Keeps up with dentist No joint pain or swelling Brown spots--nothing worrisome. Derm evals yearly Bowels are still a problem--- IBS. No meds for this. Stays on culturelle probiotic    Objective:   Physical Exam  Constitutional: She is oriented to person, place, and time. She appears well-developed and well-nourished. No distress.  HENT:  Mouth/Throat: Oropharynx is clear and moist. No oropharyngeal exudate.  Neck: Normal range of motion. Neck supple. No thyromegaly present.    Cardiovascular: Normal rate, regular rhythm, normal heart sounds and intact distal pulses.  Exam reveals no gallop.   No murmur heard. Pulmonary/Chest: Effort normal and breath sounds normal. No respiratory distress. She has no wheezes. She has no rales.  Abdominal: Soft. There is no tenderness.  Musculoskeletal: She exhibits no edema or tenderness.  Lymphadenopathy:    She has no cervical adenopathy.  Neurological: She is alert and oriented to person, place, and time.  Skin: No rash noted. No erythema.  Psychiatric: She has a normal mood and affect. Her behavior is normal.          Assessment & Plan:

## 2015-12-28 DIAGNOSIS — J301 Allergic rhinitis due to pollen: Secondary | ICD-10-CM | POA: Diagnosis not present

## 2016-01-03 ENCOUNTER — Other Ambulatory Visit: Payer: Self-pay | Admitting: Internal Medicine

## 2016-01-11 DIAGNOSIS — J301 Allergic rhinitis due to pollen: Secondary | ICD-10-CM | POA: Diagnosis not present

## 2016-01-20 DIAGNOSIS — J301 Allergic rhinitis due to pollen: Secondary | ICD-10-CM | POA: Diagnosis not present

## 2016-01-25 DIAGNOSIS — J301 Allergic rhinitis due to pollen: Secondary | ICD-10-CM | POA: Diagnosis not present

## 2016-02-15 DIAGNOSIS — J301 Allergic rhinitis due to pollen: Secondary | ICD-10-CM | POA: Diagnosis not present

## 2016-02-22 DIAGNOSIS — J301 Allergic rhinitis due to pollen: Secondary | ICD-10-CM | POA: Diagnosis not present

## 2016-02-29 DIAGNOSIS — J301 Allergic rhinitis due to pollen: Secondary | ICD-10-CM | POA: Diagnosis not present

## 2016-03-07 DIAGNOSIS — J301 Allergic rhinitis due to pollen: Secondary | ICD-10-CM | POA: Diagnosis not present

## 2016-03-14 DIAGNOSIS — J301 Allergic rhinitis due to pollen: Secondary | ICD-10-CM | POA: Diagnosis not present

## 2016-03-18 DIAGNOSIS — J301 Allergic rhinitis due to pollen: Secondary | ICD-10-CM | POA: Diagnosis not present

## 2016-03-21 DIAGNOSIS — J301 Allergic rhinitis due to pollen: Secondary | ICD-10-CM | POA: Diagnosis not present

## 2016-04-04 DIAGNOSIS — J301 Allergic rhinitis due to pollen: Secondary | ICD-10-CM | POA: Diagnosis not present

## 2016-04-07 ENCOUNTER — Ambulatory Visit (INDEPENDENT_AMBULATORY_CARE_PROVIDER_SITE_OTHER): Payer: Medicare Other | Admitting: Family Medicine

## 2016-04-07 ENCOUNTER — Encounter: Payer: Self-pay | Admitting: Family Medicine

## 2016-04-07 VITALS — BP 122/82 | HR 81 | Temp 98.1°F | Wt 134.2 lb

## 2016-04-07 DIAGNOSIS — J069 Acute upper respiratory infection, unspecified: Secondary | ICD-10-CM

## 2016-04-07 DIAGNOSIS — R059 Cough, unspecified: Secondary | ICD-10-CM

## 2016-04-07 DIAGNOSIS — R05 Cough: Secondary | ICD-10-CM

## 2016-04-07 MED ORDER — AZITHROMYCIN 250 MG PO TABS: ORAL_TABLET | ORAL | 0 refills | Status: DC

## 2016-04-07 MED ORDER — GUAIFENESIN-CODEINE 100-10 MG/5ML PO SYRP: 5.0000 mL | ORAL_SOLUTION | Freq: Every evening | ORAL | 0 refills | Status: DC | PRN

## 2016-04-07 NOTE — Progress Notes (Signed)
Pre visit review using our clinic review tool, if applicable. No additional management support is needed unless otherwise documented below in the visit note. 

## 2016-04-07 NOTE — Patient Instructions (Addendum)
Please take cough syrup at bedtime- it contains a cough suppressant and a small amount of guaifenesin Drink enough liquids to make your urine light yellow  If not better in 2-3 days, please start antibiotic

## 2016-04-07 NOTE — Progress Notes (Signed)
Subjective:    Patient ID: Ariel Phillips, female    DOB: 1932/09/13, 82 y.o.   MRN: [id number]  HPI This is an 82 yo female who presents today with nasal congestion and cough. Had a "wierd cold," several weeks ago- burning in nose, sneezing. Went away when she was out of town, but now with laryngitis, cough, sneezing for 2 weeks. Has been on Allegra, Claritin and Azelastine "forever." Gets allergy shots at [facility].  Nasal drainage is drippy and clear. Cough worse with lying down, no SOB or wheeze. Taking guaifenesin without much relief.  No sinus pressure or headache. Most bothersome symptom is cough. No muscle aches.  Can not take steroids- not sure why. Stomach is sensitive to antibiotic.   Past Medical History:  Diagnosis Date  . Allergic asthma   . Allergic rhinitis due to pollen   . GERD (gastroesophageal reflux disease)   . IBS (irritable bowel syndrome)   . Osteopenia   . Unspecified hypothyroidism    Past Surgical History:  Procedure Laterality Date  . BASAL CELL CARCINOMA EXCISION  2000's   nose  . CHOLECYSTECTOMY    . [place] or so   with fissure repair  . TUBAL LIGATION     Family History  Problem Relation Age of Onset  . COPD Mother   . Cancer Mother     unclear diagnosis  . Stroke Mother   . Diabetes Sister   . Hypertension Sister   . Stroke Other   . Cancer Paternal Grandmother     ?breast  . Heart disease Neg Hx    Social History  Substance Use Topics  . Smoking status: Never Smoker  . Smokeless tobacco: Never Used  . Alcohol use Yes     Comment: rare wine      Review of Systems Per HPI    Objective:   Physical Exam  Constitutional: She is oriented to person, place, and time. She appears well-developed and well-nourished. No distress.  HENT:  Head: Normocephalic and atraumatic.  Right Ear: External ear normal.  Left Ear: External ear normal.  Nose: Mucosal edema and rhinorrhea present.  Mouth/Throat: Oropharynx is  clear and moist.  Eyes: Conjunctivae are normal.  Neck: Normal range of motion. Neck supple.  Cardiovascular: Normal rate, regular rhythm and normal heart sounds.   Pulmonary/Chest: Effort normal and breath sounds normal. No respiratory distress.  Musculoskeletal: She exhibits no edema.  Lymphadenopathy:    She has no cervical adenopathy.  Neurological: She is alert and oriented to person, place, and time.  Skin: Skin is warm and dry. She is not diaphoretic.  Psychiatric: She has a normal mood and affect. Her behavior is normal. Judgment and thought content normal.  Vitals reviewed.     BP 122/82 (BP Location: Left Arm, Patient Position: Sitting, Cuff Size: Normal)   Pulse 81   Temp 98.1 F (36.7 C) (Oral)   Wt 134 lb 4 oz (60.9 kg)   SpO2 99%   BMI 23.23 kg/m  Wt Readings from Last 3 Encounters:  04/07/16 134 lb 4 oz (60.9 kg)  12/25/15 134 lb (60.8 kg)  12/02/15 135 lb (61.2 kg)       Assessment & Plan:  1. Cough - guaiFENesin-codeine (ROBITUSSIN AC) 100-10 MG/5ML syrup; Take 5 mLs by mouth at bedtime as needed for cough.  Dispense: 60 mL; Refill: 0  2. Acute upper respiratory infection - likely viral with laryngitis and clear drainage, provided a wait  and see prescription for antibiotic if she is not better in 3-4 days - azithromycin (ZITHROMAX) 250 MG tablet; Take 2 tabs PO x 1 dose, then 1 tab PO QD x 4 days  Dispense: 6 tablet; Refill: 0 - RTC precautions reviewed - may need to change antihistamines since she has been on same regimen for so long.   Clarene Reamer, FNP-BC  [facility]  04/07/2016 1:01 PM

## 2016-04-11 DIAGNOSIS — J301 Allergic rhinitis due to pollen: Secondary | ICD-10-CM | POA: Diagnosis not present

## 2016-04-18 DIAGNOSIS — J301 Allergic rhinitis due to pollen: Secondary | ICD-10-CM | POA: Diagnosis not present

## 2016-04-21 DIAGNOSIS — D692 Other nonthrombocytopenic purpura: Secondary | ICD-10-CM | POA: Diagnosis not present

## 2016-04-21 DIAGNOSIS — Z85828 Personal history of other malignant neoplasm of skin: Secondary | ICD-10-CM | POA: Diagnosis not present

## 2016-04-21 DIAGNOSIS — D225 Melanocytic nevi of trunk: Secondary | ICD-10-CM | POA: Diagnosis not present

## 2016-04-21 DIAGNOSIS — L814 Other melanin hyperpigmentation: Secondary | ICD-10-CM | POA: Diagnosis not present

## 2016-05-09 DIAGNOSIS — J301 Allergic rhinitis due to pollen: Secondary | ICD-10-CM | POA: Diagnosis not present

## 2016-05-16 DIAGNOSIS — J301 Allergic rhinitis due to pollen: Secondary | ICD-10-CM | POA: Diagnosis not present

## 2016-05-23 DIAGNOSIS — J301 Allergic rhinitis due to pollen: Secondary | ICD-10-CM | POA: Diagnosis not present

## 2016-05-25 ENCOUNTER — Ambulatory Visit (INDEPENDENT_AMBULATORY_CARE_PROVIDER_SITE_OTHER): Payer: Medicare Other | Admitting: Clinical

## 2016-05-25 ENCOUNTER — Encounter (HOSPITAL_COMMUNITY): Payer: Self-pay | Admitting: Clinical

## 2016-05-25 DIAGNOSIS — F331 Major depressive disorder, recurrent, moderate: Secondary | ICD-10-CM
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
## 2016-05-31 DIAGNOSIS — H43811 Vitreous degeneration, right eye: Secondary | ICD-10-CM | POA: Diagnosis not present

## 2016-06-06 DIAGNOSIS — J301 Allergic rhinitis due to pollen: Secondary | ICD-10-CM | POA: Diagnosis not present

## 2016-06-07 DIAGNOSIS — H33311 Horseshoe tear of retina without detachment, right eye: Secondary | ICD-10-CM | POA: Diagnosis not present

## 2016-06-09 ENCOUNTER — Ambulatory Visit (INDEPENDENT_AMBULATORY_CARE_PROVIDER_SITE_OTHER): Payer: Medicare Other | Admitting: Clinical

## 2016-06-09 DIAGNOSIS — F331 Major depressive disorder, recurrent, moderate: Secondary | ICD-10-CM

## 2016-06-12 NOTE — Progress Notes (Signed)
   THERAPIST PROGRESS NOTE  Session Time: 10:03 - 10:56  Participation Level: Active  Behavioral Response: CasualAlertDepressed  Type of Therapy: Individual Therapy  Treatment Goals addressed: improve psychiatric symptoms, elevate mood, improve unhelpful thought patterns,  emotional regulation (anger and stress management),  healthy coping skills   Interventions: Motivational Interviewing, Cognitive Behavioral Therapy, psycho education, grounding and mindfulness techniques  Summary: Ariel Phillips is a 83 y.o. female who presents with Major depressive disorder, recurrent episode, moderate.   Suicidal/Homicidal: Nowithout intent/plan  Therapist Response: Ariel Phillips met with clinician for an individual session. She dicussed her psychiatric symptoms, her current life events and her goals for therapy.  She shared that her symptoms remained the same. Clinician introduced grounding and mindfulness techniques. Clinician explained the process, purpose and practice of the techniques. Client and clinician practiced some of the techniques together. Ariel Phillips asked clarifying questions which clinician answered. Clinician introduced some basic cbt concepts. Client and clinician discussed the thought emotion connection. Clinician introduced a 7 panel thought record sheet. Clinician explained how it would be used in future sessions. Clinician gave her a homework packet. She agreed to complete the homework packet and to practice her grounding and mindfulness techniques daily until next session  Plan: Return again in 1-3 weeks.  Diagnosis: Axis I: Major depressive disorder, recurrent episode, moderate       Mace Weinberg A, LCSW 06/12/2016

## 2016-06-15 DIAGNOSIS — J301 Allergic rhinitis due to pollen: Secondary | ICD-10-CM | POA: Diagnosis not present

## 2016-06-20 ENCOUNTER — Other Ambulatory Visit: Payer: Self-pay | Admitting: Internal Medicine

## 2016-06-23 ENCOUNTER — Ambulatory Visit (HOSPITAL_COMMUNITY): Payer: Self-pay | Admitting: Clinical

## 2016-06-27 DIAGNOSIS — J301 Allergic rhinitis due to pollen: Secondary | ICD-10-CM | POA: Diagnosis not present

## 2016-07-04 ENCOUNTER — Ambulatory Visit (INDEPENDENT_AMBULATORY_CARE_PROVIDER_SITE_OTHER): Payer: Medicare Other | Admitting: Clinical

## 2016-07-04 ENCOUNTER — Encounter (HOSPITAL_COMMUNITY): Payer: Self-pay | Admitting: Clinical

## 2016-07-04 DIAGNOSIS — F331 Major depressive disorder, recurrent, moderate: Secondary | ICD-10-CM | POA: Diagnosis not present

## 2016-07-04 NOTE — Progress Notes (Signed)
   THERAPIST PROGRESS NOTE  Session Time: 2:45 -3:30  Participation Level: Active  Behavioral Response: CasualAlertAnxious  Type of Therapy: Individual Therapy  Treatment Goals addressed: improve psychiatric symptoms, elevate mood (more present, decreased self judgement, decrease feelings of fear,) improve unhelpful thought patterns, elevate mood (increased self-esteem, increase willingness to share with others, emotional regulation (anger and stress management), learn about diagnosis, healthy coping skills   Interventions: Motivational Interviewing, Cognitive Behavioral Therapy, psycho education, grounding and mindfulness techniques  Summary: Ariel Phillips is an 83 y.o. female who presents with Major depressive disorder, recurrent episode, moderate.   Suicidal/Homicidal: No without intent/plan  Therapist Response: Ariel Phillips met with clinician for an individual session. She discussed her psychiatric symptoms, her current life events and her homework  She shared that she was emotionally anxious and depressed. She shared that she did not complete or even work on some major tasks that she took on. Clinician asked open ended questions and she shared about the negative beliefs the non-completing had brought up about her value as a person. Client and clinician used a seven panel thought record sheet to evaluate her beliefs about herself. Clinician asked open ended questions and she identified the evidence for and against the negative beliefs. Client and clinician discussed the difference between I failed to complete that task and I am a failure as a person. Client and clinician discussed examples of people who had failed at tasks, but who were successes or worthy people despite failing at a task. [name] was then able to formulate healthier alternative beliefs. Client and clinician reviewed and discussed her homework packet. Client shared that she has been practicing     Plan: Return in 1-3  weeks.  Diagnosis: Axis I: Major depressive disorder, recurrent episode, moderate   Kemyah Buser A, LCSW 07/04/2016

## 2016-07-08 ENCOUNTER — Encounter: Payer: Self-pay | Admitting: Internal Medicine

## 2016-07-13 ENCOUNTER — Encounter: Payer: Self-pay | Admitting: Internal Medicine

## 2016-07-13 DIAGNOSIS — J301 Allergic rhinitis due to pollen: Secondary | ICD-10-CM | POA: Diagnosis not present

## 2016-07-18 ENCOUNTER — Ambulatory Visit (HOSPITAL_COMMUNITY): Payer: Self-pay | Admitting: Clinical

## 2016-07-20 ENCOUNTER — Encounter (HOSPITAL_COMMUNITY): Payer: Self-pay | Admitting: Clinical

## 2016-07-20 ENCOUNTER — Ambulatory Visit (INDEPENDENT_AMBULATORY_CARE_PROVIDER_SITE_OTHER): Payer: Medicare Other | Admitting: Clinical

## 2016-07-20 DIAGNOSIS — F331 Major depressive disorder, recurrent, moderate: Secondary | ICD-10-CM | POA: Diagnosis not present

## 2016-07-20 NOTE — Progress Notes (Signed)
   THERAPIST PROGRESS NOTE   Session Time: 10:03 -11:00  Participation Level: Active  Behavioral Response: CasualAlertAnxious   Type of Therapy: Individual Therapy  Treatment Goals addressed: improve psychiatric symptoms, elevate mood ( decreased self judgement,) improve unhelpful thought patterns, elevate mood (increased self-esteem) emotional regulation ( stress management),  healthy coping skills   Interventions: Motivational Interviewing, Cognitive Behavioral Therapy, psycho education, grounding and mindfulness techniques  Summary: Ariel Phillips is an 83 y.o. female who presents with Major depressive disorder, recurrent episode, moderate.   Suicidal/Homicidal: No without intent/plan  Therapist Response: Ariel Phillips met with clinician for an individual session. She discussed her psychiatric symptoms, her current life events and her homework  She shared that she completed her homework packet procrastination 3 (she reports her procrastination increases her depression). Clinician asked open ended questions. Client and clinician discussed her thoughts and insights from her homework. She shared that she completed on task that needed to be done but that she did not want to do each day. She shared that she was surprised to discover that she felt improved mood for the rest of the day after doing so. Client and clinician discussed this and reinforced her positive experience. Ariel Phillips also discussed some negative thoughts she had about herself and perfectionism. Clinician asked open ended questions and Ariel Phillips identified her negative beliefs. She then identified many examples of exceptions to her beliefs. Client and clinician discussed the benefits of changing her beliefs. Client and clinician reviewed how to do so.  Clinician taught Ariel Phillips a new grounding technique, Client and clinician practiced it together.   Plan: Return in 1-3 weeks.  Diagnosis: Axis I: Major depressive disorder, recurrent  episode, moderate   Danashia Landers A, LCSW 07/20/2016

## 2016-07-25 DIAGNOSIS — J301 Allergic rhinitis due to pollen: Secondary | ICD-10-CM | POA: Diagnosis not present

## 2016-08-01 DIAGNOSIS — J301 Allergic rhinitis due to pollen: Secondary | ICD-10-CM | POA: Diagnosis not present

## 2016-08-08 DIAGNOSIS — J301 Allergic rhinitis due to pollen: Secondary | ICD-10-CM | POA: Diagnosis not present

## 2016-08-09 ENCOUNTER — Encounter (HOSPITAL_COMMUNITY): Payer: Self-pay | Admitting: Clinical

## 2016-08-09 ENCOUNTER — Ambulatory Visit (INDEPENDENT_AMBULATORY_CARE_PROVIDER_SITE_OTHER): Payer: Medicare Other | Admitting: Clinical

## 2016-08-09 DIAGNOSIS — F331 Major depressive disorder, recurrent, moderate: Secondary | ICD-10-CM | POA: Diagnosis not present

## 2016-08-09 NOTE — Progress Notes (Addendum)
   THERAPIST PROGRESS NOTE  Session Time: 10:05 - 11:00  Participation Level: Active  Behavioral Response: CasualAlertDepressed  Type of Therapy: Individual Therapy  Treatment Goals addressed: improve psychiatric symptoms, elevate mood, improve unhelpful thought patterns, healthy coping skills   Interventions: Motivational Interviewing,   Summary: Ariel Phillips is an 83 y.o. female who presents with Major depressive disorder, recurrent episode, moderate.   Suicidal/Homicidal: No without intent/plan  Therapist Response: Ariel Phillips met with clinician for an individual session. She discussed her psychiatric symptoms, her current life events and her homework  She shared that she has been feeling depressed. Clinician asked open ended questions and Ariel Phillips shared that she experienced an event that made her feel "Old" which depressed her. Clinician asked open ended questions and Ariel Phillips shared about the event and her negative thoughts. Ariel Phillips then shared the evidence for and against the thoughts. She was then able to formulate healthier alternative thoughts. Clinician asked open ended questions and Ariel Phillips identified activities that would increase her, health, balance, and mood. Clincian asked open ended questions and Ariel Phillips identified what steps would need to be taken to participate in those activities as well as her motivation and the benefit of doing so. She had the insight that she has the power to improve her situation. Ariel Phillips and clinician reviewed and discussed her homework  Plan: Return in 1-3 weeks.  Diagnosis: Axis I: Major depressive disorder, recurrent episode, moderate    Mihail Prettyman A, LCSW 08/09/2016

## 2016-08-15 DIAGNOSIS — J301 Allergic rhinitis due to pollen: Secondary | ICD-10-CM | POA: Diagnosis not present

## 2016-08-16 ENCOUNTER — Encounter: Payer: Self-pay | Admitting: Internal Medicine

## 2016-08-16 ENCOUNTER — Ambulatory Visit (INDEPENDENT_AMBULATORY_CARE_PROVIDER_SITE_OTHER): Payer: Medicare Other | Admitting: Internal Medicine

## 2016-08-16 ENCOUNTER — Other Ambulatory Visit (INDEPENDENT_AMBULATORY_CARE_PROVIDER_SITE_OTHER): Payer: Medicare Other

## 2016-08-16 VITALS — BP 108/64 | HR 64 | Ht 63.0 in | Wt 138.8 lb

## 2016-08-16 DIAGNOSIS — K6289 Other specified diseases of anus and rectum: Secondary | ICD-10-CM | POA: Diagnosis not present

## 2016-08-16 DIAGNOSIS — R32 Unspecified urinary incontinence: Secondary | ICD-10-CM | POA: Diagnosis not present

## 2016-08-16 DIAGNOSIS — E039 Hypothyroidism, unspecified: Secondary | ICD-10-CM

## 2016-08-16 DIAGNOSIS — K582 Mixed irritable bowel syndrome: Secondary | ICD-10-CM

## 2016-08-16 DIAGNOSIS — R159 Full incontinence of feces: Secondary | ICD-10-CM | POA: Diagnosis not present

## 2016-08-16 DIAGNOSIS — R490 Dysphonia: Secondary | ICD-10-CM | POA: Diagnosis not present

## 2016-08-16 DIAGNOSIS — R194 Change in bowel habit: Secondary | ICD-10-CM | POA: Diagnosis not present

## 2016-08-16 LAB — TSH: TSH: 3.88 u[IU]/mL (ref 0.35–4.50)

## 2016-08-16 NOTE — Progress Notes (Signed)
Ariel Phillips 83 y.o. June 26, 1977 [id number]  Assessment & Plan:   Encounter Diagnoses  Name Primary?  . Change in bowel habits Yes  . Urinary and fecal incontinence   . Anal sphincter incompetence   . Irritable bowel syndrome with both constipation and diarrhea   . Acquired hypothyroidism   . Hoarseness    Benefiber 1-2 tbsp/dayTo try to promote more regular defecation throughout the week as opposed to these days without and then days with multiple uncontrollable stools. Something like MiraLAX as well but will see. Her problems are aggravated by anal sphincter incompetence and pelvic floor weakness I think, the anal sphincter incompetence may be in large part due to an anal fissure surgery when she was a youngster.  ColonoscopyTo investigate this change in bowel habits. The likelihood this is a worsening of irritable bowel syndrome but it's been a while since she's had a colonoscopy and she is reporting something changed so I think that is reasonable. The risks and benefits as well as alternatives of endoscopic procedure(s) have been discussed and reviewed. All questions answered. The patient agrees to proceed.  Further plans pending colonoscopy  TSH and T4  checked today also on the she had Synthroid dose adjusted upward because of a higher TSH and fatigue symptoms last summer this could have some affect on her bowel habits.  I don't think her symptoms of hoarseness are related to GERD. Probably due to allergies. We discussed that.  I appreciate the opportunity to care for this patient. CC: [name], MD  Subjective:   Chief Complaint: change in bowels  HPI This is a very nice 83 year old divorced white woman here because of worsening bowel habit problems where she has no defecation for a few days and then has urgent incontinent stools, R and soft and continuous. Over the years she has had a similar pattern but says that she used to have just an emptying with multiple  stools and could make it to the bathroom and with that okay. There has been a change lately as above. No recent antibiotics in fact she avoids these because she thinks they recap it on her bowels ever since she took some Augmentin. She relates a history of severe constipation and anal fissure surgery in high school when she was a teenager. She has bad abdominal cramps associated with urgent defecation. He has tried probiotics without benefit. She has some urinary incontinence and leakage with bending over as well as coughing and sneezing and says she hasn't been doing her exercises lately. No rectal bleeding. The stools are softer and orange in color but they do not float in the do not appear greasy or oily. Past colonoscopy probably 10 years ago or more in [place] where she lived before retiring to [place].  She does report having chronic difficulty, rarely with swallowing water were it seems like it goes down the wrong way and she has to cough. She also has a history of hoarseness that comes and goes, she saw any her nose and throat specialist in [place] and she says he looked in there and told her it was red and it was from GERD. She took Prilosec for a while but did not think it helped. She does suffer from postnasal drip. That is flaring lately as well and she thinks she's forced again, and she does admit that she has seasonal allergies. These are worse right now as the polyp is happy.  Allergies  Allergen Reactions  .  Penicillins Diarrhea  . Latex Rash    Questionable allergy per patient, rash developed at site of latex band-aid placed after toenail removal   Current Meds  Medication Sig  . aspirin 81 MG tablet Take 81 mg by mouth every other day.  Marland Kitchen azelastine (ASTELIN) 0.1 % nasal spray SPRAY 1 SPRAY INTO BOTH NOSTRILS TWICE A DAY  . carboxymethylcellulose (REFRESH PLUS) 0.5 % SOLN 2 drops daily as needed.  . fexofenadine (ALLEGRA) 180 MG tablet Take 180 mg by mouth daily.  Marland Kitchen  ketotifen (ZADITOR) 0.025 % ophthalmic solution 1 drop daily.  Marland Kitchen levothyroxine (SYNTHROID, LEVOTHROID) 75 MCG tablet Take 1 tablet (75 mcg total) by mouth daily before breakfast.  . loratadine (CLARITIN) 10 MG tablet Take 10 mg by mouth daily.  .    . PROAIR HFA 108 (90 BASE) MCG/ACT inhaler INHALE 2 PUFFS INTO THE LUNGS 3 (THREE) TIMES DAILY AS NEEDED.   Past Medical History:  Diagnosis Date  . Allergic asthma   . Allergic rhinitis due to pollen   . GERD (gastroesophageal reflux disease)   . IBS (irritable bowel syndrome)   . Osteopenia   . Unspecified hypothyroidism    Past Surgical History:  Procedure Laterality Date  . ANAL FISSURE REPAIR  1950's  . BASAL CELL CARCINOMA EXCISION  2000's   nose  . CHOLECYSTECTOMY    . COLONOSCOPY  Multiple   Negative screening exams  . [place] or so   with fissure repair  . TUBAL LIGATION     family history includes COPD in her mother; Cancer in her mother and paternal grandmother; Diabetes in her sister; Hypertension in her sister; Stroke in her mother and other. Social History   Social History  . Marital status: Divorced    Spouse name: N/A  . Number of children: 1  . Years of education: N/A   Occupational History  . Retired historian--consultant    Social History Main Topics  . Smoking status: Never Smoker  . Smokeless tobacco: Never Used  . Alcohol use Yes     Comment: rare wine  . Drug use: No  . Sexual activity: Not Asked   Other Topics Concern  . None   Social History Narrative   Divorced and retired , moved from [place], [place]   1 Daughter in [place]   Has living will   Daughter has health care POA.---[name]   Requests DNR--order done 06/19/12   Would not want prolonged mechanical ventilation   May accept tube feeds temporarily but not if cognitively unaware     Review of Systems As per history of present illness. Decreased hearing. All other review of systems are  negative.  Objective:   Physical Exam @BP  108/64   Pulse 64   Ht 5\' 3"  (1.6 m)   Wt 138 lb 12.8 oz (63 kg)   BMI 24.59 kg/m @  General:  Well-developed, well-nourished and in no acute distress Eyes:  anicteric. ENT:   Mouth and posterior pharynx free of lesions.  Neck:   supple w/o thyromegaly or mass.  Lungs: Clear to auscultation bilaterally. Heart:  S1S2, no rubs, murmurs, gallops. Abdomen:  soft, non-tender, no hepatosplenomegaly, hernia, or mass and BS+.  Rectal: June McMurry CMA present  NL anoderm Anal wink absent Decreased resting and voluntary tone Increased descent - mild Light brown tan stool  Lymph:  no cervical or supraclavicular adenopathy. Extremities:   no edema, cyanosis or clubbing Skin   no rash. Neuro:  A&O x 3.  Psych:  appropriate mood and  Affect.   Data Reviewed:  Primary care notes, 2017. Labs 2017

## 2016-08-16 NOTE — Patient Instructions (Addendum)
Your physician has requested that you go to the basement for the lab work before leaving today.  You have been scheduled for a colonoscopy. Please follow written instructions given to you at your visit today.  Please pick up your prep supplies at the pharmacy within the next 1-3 days. If you use inhalers (even only as needed), please bring them with you on the day of your procedure. Your physician has requested that you go to www.startemmi.com and enter the access code given to you at your visit today. This web site gives a general overview about your procedure. However, you should still follow specific instructions given to you by our office regarding your preparation for the procedure.  If you are age 19 or older, your body mass index should be between 23-30. Your Body mass index is 24.59 kg/m. If this is out of the aforementioned range listed, please consider follow up with your Primary Care Provider.  If you are age 41 or younger, your body mass index should be between 19-25. Your Body mass index is 24.59 kg/m. If this is out of the aformentioned range listed, please consider follow up with your Primary Care Provider.    I appreciate the opportunity to care for you. Silvano Rusk, MD, [facility]

## 2016-08-17 LAB — T4, FREE: FREE T4: 0.92 ng/dL (ref 0.60–1.60)

## 2016-08-19 NOTE — Progress Notes (Signed)
Thyroid tests are NL

## 2016-08-21 DIAGNOSIS — S51852A Open bite of left forearm, initial encounter: Secondary | ICD-10-CM | POA: Diagnosis not present

## 2016-08-21 DIAGNOSIS — W5501XA Bitten by cat, initial encounter: Secondary | ICD-10-CM | POA: Diagnosis not present

## 2016-08-22 DIAGNOSIS — J301 Allergic rhinitis due to pollen: Secondary | ICD-10-CM | POA: Diagnosis not present

## 2016-08-29 DIAGNOSIS — J301 Allergic rhinitis due to pollen: Secondary | ICD-10-CM | POA: Diagnosis not present

## 2016-08-30 ENCOUNTER — Ambulatory Visit (INDEPENDENT_AMBULATORY_CARE_PROVIDER_SITE_OTHER): Payer: Medicare Other | Admitting: Clinical

## 2016-08-30 DIAGNOSIS — F331 Major depressive disorder, recurrent, moderate: Secondary | ICD-10-CM | POA: Diagnosis not present

## 2016-08-30 NOTE — Progress Notes (Signed)
   THERAPIST PROGRESS NOTE  Session Time: 11:05 -12:00  Participation Level: Active  Behavioral Response: CasualAlertDepressed  Type of Therapy: Individual Therapy  Treatment Goals addressed: improve psychiatric symptoms, elevate mood, improve unhelpful thought patterns,  healthy coping skills   Interventions: Motivational Interviewing, Cognitive Behavioral Therapy, psycho education, grounding and mindfulness techniques  Summary: Ariel Phillips is an 83 y.o. female who presents with Major depressive disorder, recurrent episode, moderate.   Suicidal/Homicidal: No without intent/plan  Therapist Response: Ariel Phillips met with clinician for an individual session. She discussed her psychiatric symptoms, her current life events and her homework  She shared that she has had some good days and some challenging days. She shared that her procrastination has been which increased her depression and negative thoughts about herself. She shared that she had completed her homework packet on procrastination. Clinician asked open ended questions and Ariel Phillips shared her negative thoughts and emotions( fear and self judgement). Clinician asked open ended questions. Ariel Phillips identified the evidence for and against the thoughts. Ariel Phillips identified healthier alternative thoughts. Ariel Phillips and clinician discussed how her emotions and actions might change if she believed the healthier alternative thoughts. Clinician gave her the next packet in the series, which she agreed to complete. She agreed to practice challenging her negative automatic thoughts.    Plan: Return in 1-3 weeks.  Diagnosis: Axis I: Major depressive disorder, recurrent episode, moderate    Jalessa Peyser A, LCSW 08/30/2016

## 2016-08-31 ENCOUNTER — Ambulatory Visit (AMBULATORY_SURGERY_CENTER): Payer: Medicare Other | Admitting: Internal Medicine

## 2016-08-31 ENCOUNTER — Encounter: Payer: Self-pay | Admitting: Internal Medicine

## 2016-08-31 ENCOUNTER — Telehealth: Payer: Self-pay

## 2016-08-31 VITALS — BP 123/71 | HR 58 | Temp 96.8°F | Resp 13 | Ht 63.0 in | Wt 138.0 lb

## 2016-08-31 DIAGNOSIS — R194 Change in bowel habit: Secondary | ICD-10-CM | POA: Diagnosis present

## 2016-08-31 DIAGNOSIS — E039 Hypothyroidism, unspecified: Secondary | ICD-10-CM | POA: Diagnosis not present

## 2016-08-31 DIAGNOSIS — K635 Polyp of colon: Secondary | ICD-10-CM

## 2016-08-31 DIAGNOSIS — D123 Benign neoplasm of transverse colon: Secondary | ICD-10-CM

## 2016-08-31 DIAGNOSIS — M6289 Other specified disorders of muscle: Secondary | ICD-10-CM

## 2016-08-31 MED ORDER — SODIUM CHLORIDE 0.9 % IV SOLN: 500.0000 mL | INTRAVENOUS | Status: DC

## 2016-08-31 NOTE — Progress Notes (Signed)
Report to PACU, RN, vss, BBS= Clear.  

## 2016-08-31 NOTE — Patient Instructions (Addendum)
Nothing bad here. I did find a small polyp and removed it but it does not have any relation to your symptoms.  I think Irritable Bowel syndrome and the weak anal sphincter and pelvic floor are the problem.  I want you to continue Benefiber and add MiraLax if needed to help your bowels move.  I think you should try pelvic floor physical therapy also. I can set that up if you are willing.  Please make a follow-up visit for late June or July.  I appreciate the opportunity to care for you. Gatha Mayer, MD, FACG YOU HAD AN ENDOSCOPIC PROCEDURE TODAY AT [facility]:   Refer to the procedure report that was given to you for any specific questions about what was found during the examination.  If the procedure report does not answer your questions, please call your gastroenterologist to clarify.  If you requested that your care partner not be given the details of your procedure findings, then the procedure report has been included in a sealed envelope for you to review at your convenience later.  YOU SHOULD EXPECT: Some feelings of bloating in the abdomen. Passage of more gas than usual.  Walking can help get rid of the air that was put into your GI tract during the procedure and reduce the bloating. If you had a lower endoscopy (such as a colonoscopy or flexible sigmoidoscopy) you may notice spotting of blood in your stool or on the toilet paper. If you underwent a bowel prep for your procedure, you may not have a normal bowel movement for a few days.  Please Note:  You might notice some irritation and congestion in your nose or some drainage.  This is from the oxygen used during your procedure.  There is no need for concern and it should clear up in a day or so.  SYMPTOMS TO REPORT IMMEDIATELY:   Following lower endoscopy (colonoscopy or flexible sigmoidoscopy):  Excessive amounts of blood in the stool  Significant tenderness or worsening of abdominal pains  Swelling of  the abdomen that is new, acute  Fever of 100F or higher   For urgent or emergent issues, a gastroenterologist can be reached at any hour by calling [phone].   DIET:  We do recommend a small meal at first, but then you may proceed to your regular diet.  Drink plenty of fluids but you should avoid alcoholic beverages for 24 hours.  ACTIVITY:  You should plan to take it easy for the rest of today and you should NOT DRIVE or use heavy machinery until tomorrow (because of the sedation medicines used during the test).    FOLLOW UP: Our staff will call the number listed on your records the next business day following your procedure to check on you and address any questions or concerns that you may have regarding the information given to you following your procedure. If we do not reach you, we will leave a message.  However, if you are feeling well and you are not experiencing any problems, there is no need to return our call.  We will assume that you have returned to your regular daily activities without incident.  If any biopsies were taken you will be contacted by phone or by letter within the next 1-3 weeks.  Please call us at [phone] if you have not heard about the biopsies in 3 weeks.   Await for biopsy results  No repeat Colonoscopy due to age Return  to GI clinic in two months Start Benefiber 2 tablespoon daily  Office will make a referral pelvic floor physical therapy Polyps (handout given)   SIGNATURES/CONFIDENTIALITY: You and/or your care partner have signed paperwork which will be entered into your electronic medical record.  These signatures attest to the fact that that the information above on your After Visit Summary has been reviewed and is understood.  Full responsibility of the confidentiality of this discharge information lies with you and/or your care-partner.

## 2016-08-31 NOTE — Op Note (Signed)
[facility] Patient Name: Ariel Phillips Procedure Date: 08/31/2016 11:37 AM MRN: [id number] Endoscopist: Gatha Mayer , MD Age: 83 Referring MD:  Date of Birth: 23-Jun-1932 Gender: Female Account #: [id number] Procedure:                Colonoscopy Indications:              Change in bowel habits Medicines:                Propofol per Anesthesia, Monitored Anesthesia Care Procedure:                Pre-Anesthesia Assessment:                           - Prior to the procedure, a History and Physical                            was performed, and patient medications and                            allergies were reviewed. The patient's tolerance of                            previous anesthesia was also reviewed. The risks                            and benefits of the procedure and the sedation                            options and risks were discussed with the patient.                            All questions were answered, and informed consent                            was obtained. Prior Anticoagulants: The patient                            last took aspirin on the day of the procedure. ASA                            Grade Assessment: III - A patient with severe                            systemic disease. After reviewing the risks and                            benefits, the patient was deemed in satisfactory                            condition to undergo the procedure.                           After obtaining informed consent, the colonoscope  was passed under direct vision. Throughout the                            procedure, the patient's blood pressure, pulse, and                            oxygen saturations were monitored continuously. The                            Model CF-HQ190L [id number]) scope was introduced                            through the anus and advanced to the the cecum,                            identified by  appendiceal orifice and ileocecal                            valve. The colonoscopy was somewhat difficult due                            to significant looping. Successful completion of                            the procedure was aided by applying abdominal                            pressure. The patient tolerated the procedure well.                            The quality of the bowel preparation was good. The                            ileocecal valve, appendiceal orifice, and rectum                            were photographed. The bowel preparation used was                            Miralax. Scope In: 11:43:20 AM Scope Out: 12:03:36 PM Scope Withdrawal Time: 0 hours 11 minutes 16 seconds  Total Procedure Duration: 0 hours 20 minutes 16 seconds  Findings:                 The perianal examination was normal.                           The digital rectal exam findings include decreased                            sphincter tone. Pertinent negatives include no                            palpable rectal lesions.  A 8 mm polyp was found in the transverse colon. The                            polyp was sessile. The polyp was removed with a                            cold snare. Resection and retrieval were complete.                            Verification of patient identification for the                            specimen was done. Estimated blood loss was minimal.                           The exam was otherwise without abnormality. Unable                            to retroflex due to rectal deformity thought due to                            prior surgery for anal fissure. Complications:            No immediate complications. Estimated Blood Loss:     Estimated blood loss was minimal. Impression:               - Decreased sphincter tone found on digital rectal                            exam.                           - One 8 mm polyp in the transverse colon,  removed                            with a cold snare. Resected and retrieved.                           - The examination was otherwise normal. Recommendation:           - Patient has a contact number available for                            emergencies. The signs and symptoms of potential                            delayed complications were discussed with the                            patient. Return to normal activities tomorrow.                            Written discharge instructions were provided to the  patient.                           - Resume previous diet.                           - Continue present medications.                           - No repeat colonoscopy due to age.                           - Return to GI clinic in 2 months.                           - Continue with Benefiber and add Miralax if needed                            to prommote more regular defecation pattern instead                            of days without then multiple stools.                           I will recommend pelvic floor physical therapy. Gatha Mayer, MD 08/31/2016 12:12:44 PM This report has been signed electronically.

## 2016-08-31 NOTE — Progress Notes (Signed)
Called to room to assist during endoscopic procedure.  Patient ID and intended procedure confirmed with present staff. Received instructions for my participation in the procedure from the performing physician.  

## 2016-08-31 NOTE — Telephone Encounter (Signed)
Patient notified of the recommendations and she consents to proceed.  She is notified that they should contact her in the next few days to schedule.

## 2016-08-31 NOTE — Telephone Encounter (Signed)
-----   Message from Gatha Mayer, MD sent at 08/31/2016 12:16 PM EDT ----- Regarding: PT referral Please refer to [facility] for pelvic floor PT  Dx:  Fecal and urinary incontinence and pelvic floor weakness  thanks

## 2016-09-01 ENCOUNTER — Telehealth: Payer: Self-pay | Admitting: *Deleted

## 2016-09-01 NOTE — Telephone Encounter (Signed)
  Follow up Call-  Call back number 08/31/2016  Post procedure Call Back phone  # [phone]  Permission to leave phone message Yes  Some recent data might be hidden     Patient questions:  Do you have a fever, pain , or abdominal swelling? No. Pain Score  0 *  Have you tolerated food without any problems? Yes.    Have you been able to return to your normal activities? Yes.    Do you have any questions about your discharge instructions: Diet   No. Medications  No. Follow up visit  No.  Do you have questions or concerns about your Care? No.  Actions: * If pain score is 4 or above: No action needed, pain <4.

## 2016-09-02 ENCOUNTER — Encounter (HOSPITAL_COMMUNITY): Payer: Self-pay | Admitting: Clinical

## 2016-09-02 DIAGNOSIS — J301 Allergic rhinitis due to pollen: Secondary | ICD-10-CM | POA: Diagnosis not present

## 2016-09-05 DIAGNOSIS — J301 Allergic rhinitis due to pollen: Secondary | ICD-10-CM | POA: Diagnosis not present

## 2016-09-06 ENCOUNTER — Encounter: Payer: Self-pay | Admitting: Internal Medicine

## 2016-09-06 NOTE — Progress Notes (Signed)
Polyp benign No recall Age and results My Chart letter

## 2016-09-09 ENCOUNTER — Encounter: Payer: Self-pay | Admitting: Physical Therapy

## 2016-09-09 ENCOUNTER — Ambulatory Visit: Payer: Medicare Other | Attending: Internal Medicine | Admitting: Physical Therapy

## 2016-09-09 ENCOUNTER — Telehealth: Payer: Self-pay | Admitting: Internal Medicine

## 2016-09-09 DIAGNOSIS — R252 Cramp and spasm: Secondary | ICD-10-CM | POA: Insufficient documentation

## 2016-09-09 DIAGNOSIS — R278 Other lack of coordination: Secondary | ICD-10-CM | POA: Diagnosis not present

## 2016-09-09 DIAGNOSIS — M6281 Muscle weakness (generalized): Secondary | ICD-10-CM | POA: Insufficient documentation

## 2016-09-09 NOTE — Patient Instructions (Addendum)
Slow Contraction: Gravity Eliminated (Side-Lying)    Lie on left side, hips and knees slightly bent. Slowly squeeze pelvic floor for _2__ seconds. Rest for _5__ seconds. Repeat 10___ times. Do _3__ times a day.   Copyright  VHI. All rights reserved.   Too much fiber, especially the insoluble kind you get in the skin of fruits and vegetables Food and drinks with chocolate, alcohol, caffeine, fructose, or sorbitol Carbonated drinks Large meals Fried and fatty foods Dairy products, especially in people who can't digest the milk sugar lactose, called lactose intolerance Foods with wheat for people who are allergic to or have a bad reaction to gluten.  [facility] [address] [place], [place] [address] Phone # [phone] Fax [phone]

## 2016-09-09 NOTE — Therapy (Signed)
[facility] [address], [place], [place], [address] Phone: [phone]   Fax:  [phone]  Physical Therapy Evaluation  Patient Details  Name: Ariel Phillips MRN: [id number] Date of Birth: May 31, 1932 Referring Provider: Dr. Silvano Rusk  Encounter Date: 09/09/2016      PT End of Session - 09/09/16 0958    Visit Number 1   Number of Visits 10   Date for PT Re-Evaluation 11/04/16   Authorization Type medicare with g-code on 10th visit; Kx modifier on 15th   PT Start Time 0800   PT Stop Time 0845   PT Time Calculation (min) 45 min   Activity Tolerance Patient tolerated treatment well   Behavior During Therapy [facility] for tasks assessed/performed      Past Medical History:  Diagnosis Date  . Allergic asthma   . Allergic rhinitis due to pollen   . Allergy   . GERD (gastroesophageal reflux disease)   . IBS (irritable bowel syndrome)   . Osteopenia   . Unspecified hypothyroidism     Past Surgical History:  Procedure Laterality Date  . ANAL FISSURE REPAIR  1950's  . BASAL CELL CARCINOMA EXCISION  2000's   nose  . CHOLECYSTECTOMY    . COLONOSCOPY  Multiple   Negative screening exams  . [place] or so   with fissure repair  . TUBAL LIGATION      There were no vitals filed for this visit.       Subjective Assessment - 09/09/16 0814    Subjective Patient reports she has IBS and MD wants her to come for pelvic floor exercises. Patient has to get to the bathroom quickly with labor pains.    Patient Stated Goals reduce pain, cramping, fecal leakage with soft stools   Currently in Pain? Yes   Pain Score 8    Pain Location Abdomen   Pain Orientation Lower   Pain Descriptors / Indicators --  labor pains, quick   Pain Type Chronic pain   Pain Onset More than a month ago   Pain Frequency Intermittent   Aggravating Factors  Not sure    Pain Relieving Factors not sure   Multiple Pain Sites  No            [facility] PT Assessment - 09/09/16 0001      Assessment   Medical Diagnosis M62.89 Pelvic floor dysfunction   Referring Provider Dr. Silvano Rusk   Onset Date/Surgical Date 03/02/16   Prior Therapy None     Precautions   Precautions None     Restrictions   Weight Bearing Restrictions No     Balance Screen   Has the patient fallen in the past 6 months Yes  walk on platform and caught toe on elevated piece of concret   How many times? 1   Has the patient had a decrease in activity level because of a fear of falling?  No   Is the patient reluctant to leave their home because of a fear of falling?  No     Home Ecologist residence     Prior Function   Level of [place] Retired   Leisure exercise program 3 days per week     Cognition   Overall Cognitive Status Within Functional Limits for tasks assessed     Observation/Other Assessments   Focus on Therapeutic Outcomes (FOTO)  49% limitation for bowel leakage  goal is 39% limitation     Posture/Postural Control   Posture/Postural Control Postural limitations   Postural Limitations Rounded Shoulders;Forward head;Decreased lumbar lordosis  increased cervical thoracic kyphosis     ROM / Strength   AROM / PROM / Strength AROM;PROM;Strength     AROM   Lumbar Extension decrease by 50%     PROM   Overall PROM  Within functional limits for tasks performed     Strength   Right Hip Flexion 4/5   Right Hip Extension 3+/5   Right Hip External Rotation  3+/5   Right Hip Internal Rotation 4/5   Right Hip ABduction 3+/5   Left Hip Flexion 4/5   Left Hip Extension 3+/5   Left Hip External Rotation 3+/5   Left Hip Internal Rotation 4/5   Left Hip ABduction 3+/5   Right Knee Flexion 4/5   Right Knee Extension 4/5   Left Knee Flexion 4/5   Left Knee Extension 4/5     Palpation   SI assessment  correct alignment   Palpation comment tightness and  tenderness located in superior midline of abdomen,      Transfers   Transfers Not assessed  will get dizzy with rolling in bed and going from sit to sup     Ambulation/Gait   Ambulation/Gait No                 Pelvic Floor Special Questions - 09/09/16 0001    Urinary Leakage Yes   Pad use wears 1 pad at night for leakage; during the day may wear diapers due to IBS   Activities that cause leaking Other  not aware of it   Urinary urgency --  urgency for bowel movement   Fecal incontinence Yes   Caffeine beverages 1 cup per day   Perineal Body/Introitus  Gaping  anus area; as spread skin can look up anus   Pelvic Floor Internal Exam Patient confirms identification and approves PT to assess anal muscle strength   Exam Type Rectal  sidely   Sensation not able to tell if she is tightening her anus   Palpation thickness palpated on the posterior introitus; can    Strength Flicker   Strength # of reps 3   Strength # of seconds 1   Tone low                  PT Education - 09/09/16 0958    Education provided Yes   Education Details pelvic floor contraction, information on IBS diet   Person(s) Educated Patient   Methods Explanation;Demonstration;Handout;Verbal cues   Comprehension Verbalized understanding;Returned demonstration          PT Short Term Goals - 09/09/16 1008      PT SHORT TERM GOAL #1   Title independent with initial HEP   Time 4   Period Weeks   Status New     PT SHORT TERM GOAL #2   Title understand what foods could irritate her IBS   Time 4   Period Weeks   Status New     PT SHORT TERM GOAL #3   Title understand how to perform abdominal massage to reduce cramping   Time 4   Period Weeks   Status New     PT SHORT TERM GOAL #4   Title lower abdominal pain decreased >/= 25%   Time 4   Period Weeks   Status New     PT SHORT TERM GOAL #5  Title fecal incontinence decreased >/= 25% due to improve anal sphinter control   Time  4   Period Weeks   Status New           PT Long Term Goals - 09/09/16 1010      PT LONG TERM GOAL #1   Title independent with HEP and understands how to progress herself   Time 8   Period Weeks   Status New     PT LONG TERM GOAL #2   Title fecal leakage while walking to the bathroom decreased >/= 50%   Time 8   Period Weeks   Status New     PT LONG TERM GOAL #3   Title anal sphincter tone increased with strength increase to 3/5 to control fecal leakage   Time 8   Period Weeks   Status New     PT LONG TERM GOAL #4   Title increased awareness of urinary and fecal leakage >/= 50%   Time 8   Period Weeks   Status New     PT LONG TERM GOAL #5   Title FOTO score >/= 39% limitation   Time 8   Period Weeks   Status New               Plan - 09/09/16 0959    Clinical Impression Statement Patinent is a 83 year old female with fecal and urinary incontinence that has become worse in the past 6 months.  Patient reports lower abdominal pain at level 8/10 with cramping and labor type pain when her IBS is acting up.  Patient reports she will leak urine without knowing and wears a light day pad at night for it.  Patient will wear a diaper for the day  when her IBS is acting up.  Patient reports she is able to stop the flow of urine when urinating but when she has diarrhea she is unable to stop the flow with anal contraction.  Pelvic floor strength is 1/5 with contraction of the external sphincter and only able to contract the puborectalis with tapping of the muscle.  Patient has low tone of the anal muscles due to the anus is gapping open and ability to see into the anus. Patient has thickness of the anal canal posteriorly which may be from the surgery to fix her anal fissuer when she was a teenager. When patient has the urge to have a bowel movement she is unable to stop the flow.  Patient has weakness in bilateral lower extremities.  Patient will feel dizzy with head movement going  from sit to supine and rolling in bed.  Therapist instructed patient to be assessed by her doctor. Patient is low complex patient with stable conditoin and IBS comorbidities that could impact care provided. Patient will benefit from skilled therapy to increase strength of pelvic floor muscles so she is able to reduce urinary and fecal incontinence.    Rehab Potential Good   Clinical Impairments Affecting Rehab Potential IBS   PT Frequency 1x / week   PT Duration 8 weeks   PT Treatment/Interventions Biofeedback;Electrical Stimulation;Therapeutic activities;Therapeutic exercise;Neuromuscular re-education;Patient/family education;Scar mobilization;Manual techniques;Taping   PT Next Visit Plan instruction on IBS diet, Pelvic floor strength, how to avoid urge, LE strength, core stabilization   PT Home Exercise Plan progress as needed   Recommended Other Services patient will go to [facility] therapy due to closer to her home   Consulted and Agree with Plan of Care Patient  Patient will benefit from skilled therapeutic intervention in order to improve the following deficits and impairments:  Decreased activity tolerance, Decreased endurance, Decreased strength, Decreased scar mobility, Increased muscle spasms, Pain  Visit Diagnosis: Muscle weakness (generalized) - Plan: PT plan of care cert/re-cert  Other lack of coordination - Plan: PT plan of care cert/re-cert  Cramp and spasm - Plan: PT plan of care cert/re-cert      G-Codes - 67/59/16 3846    Functional Assessment Tool Used (Outpatient Only) FOTO score is 49% limitation  goal is39% limitation   Functional Limitation Other PT primary   Other PT Primary Current Status (K5993) At least 40 percent but less than 60 percent impaired, limited or restricted   Other PT Primary Goal Status (T7017) At least 20 percent but less than 40 percent impaired, limited or restricted       Problem List Patient Active Problem List    Diagnosis Date Noted  . Memory loss 12/02/2015  . Arrhythmia 12/02/2015  . Benign essential tremor 07/20/2015  . Advanced directives, counseling/discussion 12/19/2013  . Routine general medical examination at a health care facility 12/13/2012  . Hypothyroidism 12/12/2011  . Allergic rhinitis due to pollen   . GERD (gastroesophageal reflux disease)   . IBS (irritable bowel syndrome)   . Osteopenia     Earlie Counts, PT 09/09/16 10:14 AM   [facility] [address] [place], [place], [address] Phone: [phone]   Fax:  [phone]  Name: Ariel Phillips MRN: [id number] Date of Birth: [birth year]

## 2016-09-09 NOTE — Telephone Encounter (Signed)
[facility]  Patient Name: Ariel Phillips  DOB: 02/27/1933    Initial Comment Caller states she has on and off faint vertigo. States yesterday and today is worse.   Nurse Assessment  Nurse: Ardine Bjork, RN, Melissa Date/Time (Eastern Time): 09/09/2016 10:26:30 AM  Confirm and document reason for call. If symptomatic, describe symptoms. ---Caller states she has on and off faint vertigo. States yesterday and today is worse. Sxs started yesterday.  Does the patient have any new or worsening symptoms? ---Yes  Will a triage be completed? ---Yes  Related visit to physician within the last 2 weeks? ---N/A  Does the PT have any chronic conditions? (i.e. diabetes, asthma, etc.) ---Yes  List chronic conditions. ---Thyroid.  Is this a behavioral health or substance abuse call? ---No     Guidelines    Guideline Title Affirmed Question Affirmed Notes  Dizziness - Vertigo [1] MILD dizziness (e.g., vertigo; walking normally) AND [2] has NOT been evaluated by physician for this    Final Disposition User   See PCP When Office is Open (within 3 days) Zayas, RN, Lenna Sciara    Comments  Call to office backline to inform pt-Linda states Trina, office triage nurse, will contact pt-pt willing to be seen tomorrow or in the next few days-intermittent vertigo-denies current sxs. States usually happens she is dehydrated but has been drinking plenty of fluids and intermittent vertigo continues. Denies falls. Falls precautions discussed with pt.  NOTE Triage done per Nurse M. Zayas, RN entered into epic by Debbora Dus, RN BSN at this time. no appt given Office will call her back.   Referrals  [facility]   Disagree/Comply: Comply

## 2016-09-09 NOTE — Telephone Encounter (Signed)
I spoke with Ariel Phillips and she said dizziness x 2 in last 2 days and only lasted few minutes; no symptoms now; Ariel Phillips scheduled appt with Dr Silvio Pate on 09/12/16 at 3:15. If Ariel Phillips condition changes or worsens Ariel Phillips will call and schedule appt at [facility]. FYI to Dr Silvio Pate.

## 2016-09-09 NOTE — Telephone Encounter (Signed)
That sounds fine

## 2016-09-12 ENCOUNTER — Ambulatory Visit (INDEPENDENT_AMBULATORY_CARE_PROVIDER_SITE_OTHER): Payer: Medicare Other | Admitting: Internal Medicine

## 2016-09-12 ENCOUNTER — Encounter: Payer: Self-pay | Admitting: Internal Medicine

## 2016-09-12 VITALS — BP 122/80 | HR 90 | Temp 97.8°F | Wt 137.8 lb

## 2016-09-12 DIAGNOSIS — E039 Hypothyroidism, unspecified: Secondary | ICD-10-CM

## 2016-09-12 DIAGNOSIS — J301 Allergic rhinitis due to pollen: Secondary | ICD-10-CM | POA: Diagnosis not present

## 2016-09-12 DIAGNOSIS — R42 Dizziness and giddiness: Secondary | ICD-10-CM | POA: Insufficient documentation

## 2016-09-12 NOTE — Assessment & Plan Note (Signed)
Seems euthyroid Will just check labs

## 2016-09-12 NOTE — Progress Notes (Signed)
Subjective:    Patient ID: Ariel Phillips, female    DOB: 17-Oct-1932, 83 y.o.   MRN: [id number]  HPI Here due to dizziness and balance problems  Started last week--2 days in a row First when getting up from shampoo chair-----had to hold on Doesn't remember rotation--"like flat surfaces moving in front of my eyes" The next day, it happened at [place] office as she was starting to get ready to lie down (PT) Both episodes lasted only a few seconds  No falls No tinnitus or hearing loss Balance is not great---did have sensation of "not walking straight" when walking down corridor at [facility]  No treatment  Current Outpatient Prescriptions on File Prior to Visit  Medication Sig Dispense Refill  . aspirin 81 MG tablet Take 81 mg by mouth daily.     Marland Kitchen azelastine (ASTELIN) 0.1 % nasal spray SPRAY 1 SPRAY INTO BOTH NOSTRILS TWICE A DAY (Patient taking differently: SPRAY 1 SPRAY INTO BOTH NOSTRILS once A DAY) 30 mL 11  . carboxymethylcellulose (REFRESH PLUS) 0.5 % SOLN 2 drops daily as needed.    Marland Kitchen EPINEPHrine 0.3 mg/0.3 mL IJ SOAJ injection Inject 0.3 mg into the muscle once. Use prn, takes allergy shots and has this on hand for adverse reactions    . fexofenadine (ALLEGRA) 180 MG tablet Take 180 mg by mouth daily.    Marland Kitchen ketotifen (ZADITOR) 0.025 % ophthalmic solution 1 drop daily.    Marland Kitchen levothyroxine (SYNTHROID, LEVOTHROID) 75 MCG tablet Take 1 tablet (75 mcg total) by mouth daily before breakfast. 90 tablet 3  . PRESCRIPTION MEDICATION     . PROAIR HFA 108 (90 BASE) MCG/ACT inhaler INHALE 2 PUFFS INTO THE LUNGS 3 (THREE) TIMES DAILY AS NEEDED. 8.5 each 0   Current Facility-Administered Medications on File Prior to Visit  Medication Dose Route Frequency Provider Last Rate Last Dose  . 0.9 %  sodium chloride infusion  500 mL Intravenous Continuous Gatha Mayer, MD        Allergies  Allergen Reactions  . Penicillins Diarrhea  . Latex Rash    Questionable allergy per patient, rash  developed at site of latex band-aid placed after toenail removal    Past Medical History:  Diagnosis Date  . Allergic asthma   . Allergic rhinitis due to pollen   . Allergy   . GERD (gastroesophageal reflux disease)   . IBS (irritable bowel syndrome)   . Osteopenia   . Unspecified hypothyroidism     Past Surgical History:  Procedure Laterality Date  . ANAL FISSURE REPAIR  1950's  . BASAL CELL CARCINOMA EXCISION  2000's   nose  . CHOLECYSTECTOMY    . COLONOSCOPY  Multiple   Negative screening exams  . [place] or so   with fissure repair  . TUBAL LIGATION      Family History  Problem Relation Age of Onset  . COPD Mother   . Cancer Mother        unclear diagnosis  . Stroke Mother   . Diabetes Sister   . Hypertension Sister   . Stroke Other   . Cancer Paternal Grandmother        ?breast  . Heart disease Neg Hx     Social History   Social History  . Marital status: Divorced    Spouse name: N/A  . Number of children: 1  . Years of education: N/A   Occupational History  . Retired Personnel officer  Social History Main Topics  . Smoking status: Never Smoker  . Smokeless tobacco: Never Used  . Alcohol use Yes     Comment: rare wine  . Drug use: No  . Sexual activity: Not on file   Other Topics Concern  . Not on file   Social History Narrative   Divorced and retired , moved from [place], [place]   1 Daughter in [place]   Has living will   Daughter has health care POA.---[name]   Requests DNR--order done 06/19/12   Would not want prolonged mechanical ventilation   May accept tube feeds temporarily but not if cognitively unaware   Review of Systems No falls No headache No N/V Concerned she may be dehydrated Does get spinning upon lying down into bed--- off and on for a long time. Better if she keeps up with fluid intake    Objective:   Physical Exam  Constitutional: She is oriented to person, place, and time.  She appears well-nourished. No distress.  HENT:  Mouth/Throat: Oropharynx is clear and moist. No oropharyngeal exudate.  TMs normal  Neck: No thyromegaly present.  Cardiovascular: Normal rate, regular rhythm and normal heart sounds.  Exam reveals no gallop.   No murmur heard. Pulmonary/Chest: Effort normal and breath sounds normal. No respiratory distress. She has no wheezes. She has no rales.  Abdominal: There is no tenderness.  Musculoskeletal: She exhibits no edema.  Lymphadenopathy:    She has no cervical adenopathy.  Neurological: She is oriented to person, place, and time. She has normal strength. She displays no tremor. No cranial nerve deficit. She exhibits normal muscle tone. She displays a negative Romberg sign. Coordination and gait normal.  Psychiatric: She has a normal mood and affect. Her behavior is normal.          Assessment & Plan:

## 2016-09-12 NOTE — Assessment & Plan Note (Signed)
Upon arising at times--but also while lying back Not orthostatic ----- BP lying 128/76, standing 122/80. Pulse both 84 Seems more vestibular No signs of brain stem stroke Symptoms are too short to use meclizine--but discussed in case they worsen Will check labs at her request

## 2016-09-13 LAB — COMPREHENSIVE METABOLIC PANEL
ALBUMIN: 4.2 g/dL (ref 3.5–5.2)
ALT: 14 U/L (ref 0–35)
AST: 20 U/L (ref 0–37)
Alkaline Phosphatase: 58 U/L (ref 39–117)
BUN: 20 mg/dL (ref 6–23)
CO2: 28 meq/L (ref 19–32)
Calcium: 9.5 mg/dL (ref 8.4–10.5)
Chloride: 107 mEq/L (ref 96–112)
Creatinine, Ser: 0.68 mg/dL (ref 0.40–1.20)
GFR: 88.15 mL/min (ref >60.00)
Glucose, Bld: 77 mg/dL (ref 70–99)
POTASSIUM: 4.5 meq/L (ref 3.5–5.1)
SODIUM: 139 meq/L (ref 135–145)
Total Bilirubin: 0.3 mg/dL (ref 0.2–1.2)
Total Protein: 7.1 g/dL (ref 6.0–8.3)

## 2016-09-13 LAB — CBC WITH DIFFERENTIAL/PLATELET
Basophils Absolute: 0 10*3/uL (ref 0.0–0.1)
Basophils Relative: 1 % (ref 0.0–3.0)
EOS PCT: 5.1 % — AB (ref 0.0–5.0)
Eosinophils Absolute: 0.2 10*3/uL (ref 0.0–0.7)
HEMATOCRIT: 43.6 % (ref 36.0–46.0)
HEMOGLOBIN: 14.6 g/dL (ref 12.0–15.0)
LYMPHS PCT: 39.4 % (ref 12.0–46.0)
Lymphs Abs: 1.5 10*3/uL (ref 0.7–4.0)
MCHC: 33.4 g/dL (ref 30.0–36.0)
MCV: 95 fl (ref 78.0–100.0)
MONO ABS: 0.5 10*3/uL (ref 0.1–1.0)
Monocytes Relative: 12.4 % — ABNORMAL HIGH (ref 3.0–12.0)
NEUTROS ABS: 1.6 10*3/uL (ref 1.4–7.7)
Neutrophils Relative %: 42.1 % — ABNORMAL LOW (ref 43.0–77.0)
PLATELETS: 232 10*3/uL (ref 150.0–400.0)
RBC: 4.59 Mil/uL (ref 3.87–5.11)
RDW: 13 % (ref 11.5–15.5)
WBC: 3.9 10*3/uL — AB (ref 4.0–10.5)

## 2016-09-13 LAB — T4, FREE: Free T4: 0.86 ng/dL (ref 0.60–1.60)

## 2016-09-13 LAB — TSH: TSH: 1.8 u[IU]/mL (ref 0.35–4.50)

## 2016-09-19 DIAGNOSIS — J301 Allergic rhinitis due to pollen: Secondary | ICD-10-CM | POA: Diagnosis not present

## 2016-09-20 ENCOUNTER — Ambulatory Visit (INDEPENDENT_AMBULATORY_CARE_PROVIDER_SITE_OTHER): Payer: Medicare Other | Admitting: Clinical

## 2016-09-20 ENCOUNTER — Encounter (HOSPITAL_COMMUNITY): Payer: Self-pay | Admitting: Clinical

## 2016-09-20 DIAGNOSIS — F331 Major depressive disorder, recurrent, moderate: Secondary | ICD-10-CM | POA: Diagnosis not present

## 2016-09-20 NOTE — Progress Notes (Signed)
   THERAPIST PROGRESS NOTE  Session Time: 8:03 - 9:00  Participation Level: Active  Behavioral Response: CasualAlertAnxious  Type of Therapy: Individual Therapy  Treatment Goals addressed: improve psychiatric symptoms, elevate mood, improve unhelpful thought patterns, healthy coping skills  ,  Interventions: Motivational Interviewing, Cognitive Behavioral Therapy,   Summary: Ariel Phillips is an 83 y.o. female who presents with Major depressive disorder, recurrent episode, moderate.   Suicidal/Homicidal: No without intent/plan  Therapist Response: Ariel Phillips met with clinician for an individual session. She discussed her psychiatric symptoms, her current life events and her homework  She shared that she has had some good days and some difficult days with her symptoms. Clinician asked open ended questions and Ariel Phillips shared that she had to present at a meeting and she was able to swing together the presentation but she still had not completed the work promised. She shared that she did not feel she could do the work well but had a fear of others judging her if she admitted this. Instead she procrastinated. Client and clinician reviewed and discussed her homework as it related to this topic (procratination #4). Clinician asked open ended questions about her negative beliefs about herself and the judgement of others. Clinician asked open ended questions and Ariel Phillips identified the evidence for and against the automatic thoughts. She was then able to formulate healthier alternative thoughts.  Client and clinician discussed how her actions might change if she believed the healthier alternative thoughts.   Plan: Return in 1-3 weeks.  Diagnosis: Axis I: Major depressive disorder, recurrent episode, moderate   Michelle Wnek A, LCSW 09/20/2016

## 2016-09-29 ENCOUNTER — Ambulatory Visit (HOSPITAL_COMMUNITY): Payer: Self-pay | Admitting: Clinical

## 2016-10-03 DIAGNOSIS — J301 Allergic rhinitis due to pollen: Secondary | ICD-10-CM | POA: Diagnosis not present

## 2016-10-06 ENCOUNTER — Ambulatory Visit (HOSPITAL_COMMUNITY): Payer: Self-pay | Admitting: Clinical

## 2016-10-10 ENCOUNTER — Ambulatory Visit (INDEPENDENT_AMBULATORY_CARE_PROVIDER_SITE_OTHER): Payer: Medicare Other | Admitting: Clinical

## 2016-10-10 ENCOUNTER — Encounter (HOSPITAL_COMMUNITY): Payer: Self-pay | Admitting: Clinical

## 2016-10-10 DIAGNOSIS — F331 Major depressive disorder, recurrent, moderate: Secondary | ICD-10-CM | POA: Diagnosis not present

## 2016-10-10 NOTE — Progress Notes (Signed)
THERAPIST PROGRESS NOTE  Session Time: 8:05 - 9:00  Participation Level: Active  Behavioral Response: CasualAlertDepressed  Type of Therapy: Individual Therapy  Treatment Goals addressed: improve psychiatric symptoms, elevate mood,  improve unhelpful thought patterns, healthy coping skills   Interventions: Motivational Interviewing, Cognitive Behavioral Therapy,   Summary: Ariel Phillips is an 83 y.o. female who presents with Major depressive disorder, recurrent episode, moderate.   Suicidal/Homicidal: No without intent/plan  Therapist Response: Ariel Phillips met with clinician for an individual session. She discussed her psychiatric symptoms, her current life events and her homework. She shared also she spends a lot of time sleeping  She shared that  She continues to feel overwhelmed by tasks but unable to make herself do the tasks, increasing her depression. Clinician asked open ended questions and Ariel Phillips shared that she had not completed her homework in addition to tasks she volunteered for. Clinician asked open ended questions and Ariel Phillips identified her resistance and motivations to complete a selected tasks. She identified her resistance to the tasks. She was then able to identify the things she enjoys about the tasks. Clinician asked open ended questions and Ariel Phillips identified her current actions. She was then able to identify how her actions would change if she focused on the parts of the tasks. Client and clinician discussed her negative thoughts about herself. Client and clinician discussed the evidence for and against the thoughts. She was then able to formulate healthier alternative thoughts.     Plan: Return in 1-3 weeks.  Diagnosis: Axis I: Major depressive disorder, recurrent episode, moderate    , A, LCSW 10/10/2016  

## 2016-10-11 ENCOUNTER — Telehealth: Payer: Self-pay | Admitting: Internal Medicine

## 2016-10-11 NOTE — Telephone Encounter (Signed)
heve her try ondansetron 4mg  tabs po q 6 hrs prn  Tell her to start with 1 before breakfast and 1 before supper  # 60, no refill  Ask her to let us know if this is helping - she can call back next week   If it does not help will try something else

## 2016-10-11 NOTE — Telephone Encounter (Signed)
Patient reports that she is having constant soft stools every day.  She is having multiple small BMs a day.  "it is not critical, but unpleasant".  She describes the stools as the consistency of whip cream. She feels she is mildly dehydrated.  She will try some of the oral rehydration solutions that I provided to her.  Is there anything you can recommend until OV July 9?

## 2016-10-12 MED ORDER — ONDANSETRON HCL 4 MG PO TABS: ORAL_TABLET | ORAL | 0 refills | Status: DC

## 2016-10-12 NOTE — Telephone Encounter (Signed)
Patient notified

## 2016-10-17 DIAGNOSIS — J301 Allergic rhinitis due to pollen: Secondary | ICD-10-CM | POA: Diagnosis not present

## 2016-10-20 ENCOUNTER — Encounter (HOSPITAL_COMMUNITY): Payer: Self-pay | Admitting: Clinical

## 2016-10-20 ENCOUNTER — Ambulatory Visit (INDEPENDENT_AMBULATORY_CARE_PROVIDER_SITE_OTHER): Payer: Medicare Other | Admitting: Clinical

## 2016-10-20 DIAGNOSIS — F331 Major depressive disorder, recurrent, moderate: Secondary | ICD-10-CM

## 2016-10-20 NOTE — Progress Notes (Signed)
   THERAPIST PROGRESS NOTE  Session Time: 8:05 -  8:53  Participation Level: Active  Behavioral Response: CasualAlertDepressed  Type of Therapy: Individual Therapy  Treatment Goals addressed: improve psychiatric symptoms, elevate mood,  improve unhelpful thought patterns, elevate mood,  coping skills   Interventions: Motivational Interviewing, Cognitive Behavioral Therapy,  Summary: Ariel Phillips is an 83 y.o. female who presents with Major depressive disorder, recurrent episode, moderate.   Suicidal/Homicidal: No without intent/plan  Therapist Response: [name] met with clinician for an individual session. She discussed her psychiatric symptoms, her current life events and her homework  She shared that she was doing a bit better this week with some challenging times. Clinician asked open ended questions and [name] shared about her successes and the tools she was using. Client and clinician discussed her progressMarilyn also shared about her challenges. Clinician asked open ended questions and [name] identified the triggering events, she discussed her negative emotions, she then identified her negative automatic thoughts. [name] identified the evidence for and against the negative automatic thoughts. She was then able to identify healthier alternative thoughts. Client and clinician reviewed her homework packet. [name] discussed how the information in the packet related to her depression symptoms. Clinician gave her another homework packet. Clinician informed [name] that clinician would be leaving [facility]. Clinician asked open ended questions and [name] shared her thoughts and emotions about clinician's leaving. [name] and clinician discussed her options to continue therapy with another clinician.  Plan: Return in 1-3 weeks.  Diagnosis: Axis I: Major depressive disorder, recurrent episode, moderate    Kristyana Notte A, LCSW 10/20/2016

## 2016-10-23 DIAGNOSIS — S91351A Open bite, right foot, initial encounter: Secondary | ICD-10-CM | POA: Diagnosis not present

## 2016-10-23 DIAGNOSIS — W5501XA Bitten by cat, initial encounter: Secondary | ICD-10-CM | POA: Diagnosis not present

## 2016-10-24 DIAGNOSIS — J301 Allergic rhinitis due to pollen: Secondary | ICD-10-CM | POA: Diagnosis not present

## 2016-10-27 DIAGNOSIS — H60543 Acute eczematoid otitis externa, bilateral: Secondary | ICD-10-CM | POA: Diagnosis not present

## 2016-10-27 DIAGNOSIS — H903 Sensorineural hearing loss, bilateral: Secondary | ICD-10-CM | POA: Diagnosis not present

## 2016-10-27 DIAGNOSIS — R42 Dizziness and giddiness: Secondary | ICD-10-CM | POA: Diagnosis not present

## 2016-10-27 DIAGNOSIS — J309 Allergic rhinitis, unspecified: Secondary | ICD-10-CM | POA: Diagnosis not present

## 2016-10-27 DIAGNOSIS — H6123 Impacted cerumen, bilateral: Secondary | ICD-10-CM | POA: Diagnosis not present

## 2016-10-27 DIAGNOSIS — H8111 Benign paroxysmal vertigo, right ear: Secondary | ICD-10-CM | POA: Diagnosis not present

## 2016-11-03 ENCOUNTER — Ambulatory Visit (INDEPENDENT_AMBULATORY_CARE_PROVIDER_SITE_OTHER): Payer: Medicare Other | Admitting: Clinical

## 2016-11-03 ENCOUNTER — Encounter (HOSPITAL_COMMUNITY): Payer: Self-pay | Admitting: Clinical

## 2016-11-03 DIAGNOSIS — F331 Major depressive disorder, recurrent, moderate: Secondary | ICD-10-CM | POA: Diagnosis not present

## 2016-11-03 NOTE — Progress Notes (Signed)
   THERAPIST PROGRESS NOTE  Session Time: 11:07 -12:00  Participation Level: Active  Behavioral Response: CasualAlertDepressed  Type of Therapy: Individual Therapy  Treatment Goals addressed: improve psychiatric symptoms, elevate mood,  improve unhelpful thought patterns, healthy coping skills   Interventions: Motivational Interviewing, Cognitive Behavioral Therapy,  Summary: CAROLL WEINHEIMER is an 83 y.o. female who presents with Major depressive disorder, recurrent episode, moderate.   Suicidal/Homicidal: No without intent/plan  Therapist Response: [name] met with clinician for an individual session. She discussed her psychiatric symptoms, her current life events and her homework  She shared that she was doing a little better this past week. Clinician asked open ended questions and Loletha shared that she had put her tools to use and was able to solve a problem she did not think she could and also made progress towards a task she has been procrastinating on. Clinician asked open ended questions. Taria shared her fears, and how she challenged them. She was then able to identify some of the lessons for doing so "I can do things that I am afraid I can't. Dalena shared that she believes she is numb to feelings. Clinician asked open ended questions and Joycelyn identified evidence that supported her belief as well as evidence that went against her belief. Client and clinician discussed healthier alternative thoughts. Bera shared she feels afraid to be vulnerable. Client and clinician discussed the importance of appropriate vulnerability in relationships. Client and clinician discussed the process of challenging her fears.  [name] and clinician discussed the ended of client and clinician's relationship and her options to work with a new clinician due to current therapists upcoming departure.   Plan: Return in 1-3 weeks.  Diagnosis: Axis I: Major depressive disorder, recurrent episode,  moderate    Tamel Abel A, LCSW 11/03/2016

## 2016-11-07 ENCOUNTER — Ambulatory Visit (INDEPENDENT_AMBULATORY_CARE_PROVIDER_SITE_OTHER): Payer: Medicare Other | Admitting: Internal Medicine

## 2016-11-07 ENCOUNTER — Encounter: Payer: Self-pay | Admitting: Internal Medicine

## 2016-11-07 VITALS — BP 94/62 | HR 84 | Ht 63.0 in | Wt 138.0 lb

## 2016-11-07 DIAGNOSIS — M6289 Other specified disorders of muscle: Secondary | ICD-10-CM | POA: Diagnosis not present

## 2016-11-07 DIAGNOSIS — J301 Allergic rhinitis due to pollen: Secondary | ICD-10-CM | POA: Diagnosis not present

## 2016-11-07 DIAGNOSIS — K588 Other irritable bowel syndrome: Secondary | ICD-10-CM

## 2016-11-07 NOTE — Patient Instructions (Signed)
   Try the Low FODMAPS Diet  I hope it and the PT helps.  Call me if needed.  I appreciate the opportunity to care for you. Gatha Mayer, MD, [facility]

## 2016-11-07 NOTE — Progress Notes (Signed)
Ariel Phillips 83 y.o. 1932-08-12 [id number]  Assessment & Plan:    Encounter Diagnoses  Name Primary?  . Other irritable bowel syndrome Yes  . Pelvic floor dysfunction    1.  Continue Physical Therapy for pelvic floor dysfunction 2.  FODMAP diet encouraged.  Handout provided. 3.  Use ondansetron prn for IBS flares of diarrhea 4. F/U Dr. Carlean Purl prn  I have personally seen the patient, reviewed and repeated key elements of the history and physical and participated in formation of the assessment and plan the student has documented. Gatha Mayer, MD, [facility]   Subjective:   Chief Complaint:  Follow up IBS   HPI Ariel Phillips comes to the office for follow up on treatment for IBS.  She was last seen on 08/16/2016 and was to try Benfiber 1-2 tbsp/day.  She states that the benefiber made her diarrhea worse and made her feel terrible.  She d/c use almost immediately due to ill effects.  She also went to physical therapy for pelvic floor exercises and seems to think that PT will be helpful.  She states that she likes the exercises they gave her and is willing to continue therapy to help control her IBS symptoms. Her diet was also a concern as she feels her diarrhea is linked to eating more peaches. She is willing to start the FODMAP diet to control the amount of sugar and fiber in her diet to see if this helps with her excessive diarrhea.    She has not been using the Ondansetron because she has not felt like she needs and had questions about the strength of the medicine.  Overall she thinks she is somewhat improved and is willing to continue conservative treatment with diet changes and PT.  She denies nausea and vomiting, weight loss and fatigue fevers or chills.    Allergies  Allergen Reactions  . Penicillins Diarrhea  . Latex Rash    Questionable allergy per patient, rash developed at site of latex band-aid placed after toenail removal   Current Meds  Medication Sig  . aspirin  81 MG tablet Take 81 mg by mouth daily.   Marland Kitchen azelastine (ASTELIN) 0.1 % nasal spray SPRAY 1 SPRAY INTO BOTH NOSTRILS TWICE A DAY (Patient taking differently: SPRAY 1 SPRAY INTO BOTH NOSTRILS once A DAY)  . carboxymethylcellulose (REFRESH PLUS) 0.5 % SOLN 2 drops daily as needed.  Marland Kitchen EPINEPHrine 0.3 mg/0.3 mL IJ SOAJ injection Inject 0.3 mg into the muscle once. Use prn, takes allergy shots and has this on hand for adverse reactions  . ketotifen (ZADITOR) 0.025 % ophthalmic solution 1 drop daily.  Marland Kitchen levothyroxine (SYNTHROID, LEVOTHROID) 75 MCG tablet Take 1 tablet (75 mcg total) by mouth daily before breakfast.  . loratadine (CLARITIN) 10 MG tablet Take 10 mg by mouth daily.  Marland Kitchen PRESCRIPTION MEDICATION   . PROAIR HFA 108 (90 BASE) MCG/ACT inhaler INHALE 2 PUFFS INTO THE LUNGS 3 (THREE) TIMES DAILY AS NEEDED.   Past Medical History:  Diagnosis Date  . Allergic asthma   . Allergic rhinitis due to pollen   . Allergy   . GERD (gastroesophageal reflux disease)   . IBS (irritable bowel syndrome)   . Osteopenia   . Unspecified hypothyroidism    Past Surgical History:  Procedure Laterality Date  . ANAL FISSURE REPAIR  1950's  . BASAL CELL CARCINOMA EXCISION  2000's   nose  . CHOLECYSTECTOMY    . COLONOSCOPY  Multiple   Negative screening exams  .  [place] or so   with fissure repair  . TUBAL LIGATION      family history includes COPD in her mother; Cancer in her mother and paternal grandmother; Diabetes in her sister; Hypertension in her sister; Stroke in her other; Transient ischemic attack in her mother.   Review of Systems See HPI  Objective:   Physical Exam @BP  94/62   Pulse 84   Ht 5\' 3"  (1.6 m)   Wt 138 lb (62.6 kg)   BMI 24.45 kg/m @  General:  NAD Eyes:   anicteric Lungs:  clear Heart::  S1S2 no rubs, murmurs or gallops     Data Reviewed:   Edward Qualia, PA-S [facility]

## 2016-11-08 DIAGNOSIS — M6281 Muscle weakness (generalized): Secondary | ICD-10-CM | POA: Diagnosis not present

## 2016-11-08 DIAGNOSIS — R2681 Unsteadiness on feet: Secondary | ICD-10-CM | POA: Diagnosis not present

## 2016-11-08 DIAGNOSIS — R42 Dizziness and giddiness: Secondary | ICD-10-CM | POA: Diagnosis not present

## 2016-11-08 DIAGNOSIS — R293 Abnormal posture: Secondary | ICD-10-CM | POA: Diagnosis not present

## 2016-11-08 DIAGNOSIS — H8111 Benign paroxysmal vertigo, right ear: Secondary | ICD-10-CM | POA: Diagnosis not present

## 2016-11-10 ENCOUNTER — Ambulatory Visit: Payer: Medicare Other | Attending: Internal Medicine | Admitting: Physical Therapy

## 2016-11-10 DIAGNOSIS — H8111 Benign paroxysmal vertigo, right ear: Secondary | ICD-10-CM | POA: Diagnosis not present

## 2016-11-10 DIAGNOSIS — R42 Dizziness and giddiness: Secondary | ICD-10-CM | POA: Diagnosis not present

## 2016-11-10 DIAGNOSIS — R29898 Other symptoms and signs involving the musculoskeletal system: Secondary | ICD-10-CM | POA: Insufficient documentation

## 2016-11-10 DIAGNOSIS — R252 Cramp and spasm: Secondary | ICD-10-CM | POA: Insufficient documentation

## 2016-11-10 DIAGNOSIS — R2681 Unsteadiness on feet: Secondary | ICD-10-CM | POA: Diagnosis not present

## 2016-11-10 DIAGNOSIS — R293 Abnormal posture: Secondary | ICD-10-CM | POA: Diagnosis not present

## 2016-11-10 DIAGNOSIS — M6281 Muscle weakness (generalized): Secondary | ICD-10-CM | POA: Diagnosis not present

## 2016-11-10 DIAGNOSIS — R278 Other lack of coordination: Secondary | ICD-10-CM | POA: Insufficient documentation

## 2016-11-10 NOTE — Patient Instructions (Addendum)
   Change bladder irritant  To water ratio  4 cups ( coffee and club soda)  : 2 cups of water   ----------->  this week, increase water to 3 cups.   Next week, decrease coffee or club soda , and increase to 4 cups of water   __________   Avoid straining pelvic floor, abdominal muscles , spine  Use log rolling technique instead of getting out of bed with your neck or the sit-up   Log rolling out of .bed  L  arm overhead  Raise hips and scoot hips to R   Drop knees to L,  scooting L shoulder back to get completely on your L side so your shoulders, hips, and knees point to the L    Then breathe as you drop feet off bed and prop onto L elbow and  use both hands to push yourself   _____________  Proper sitting posture:  Feet on the ground, sitting onto sitting bones   _____________

## 2016-11-10 NOTE — Therapy (Signed)
[place] MAIN [facility] SERVICES [address] [place], [place], [address] Phone: [phone]   Fax:  [phone]  Physical Therapy Evaluation  Patient Details  Name: Ariel Phillips MRN: [id number] Date of Birth: 15-Jun-1932 Referring Provider: Dr. Silvano Rusk  Encounter Date: 11/10/2016      PT End of Session - 11/10/16 1623    Visit Number 1   Number of Visits 10   Date for PT Re-Evaluation 02/02/17   Authorization Type medicare with g-code   PT Start Time 1530   PT Stop Time 1634   PT Time Calculation (min) 64 min   Activity Tolerance Patient tolerated treatment well   Behavior During Therapy [facility] for tasks assessed/performed      Past Medical History:  Diagnosis Date  . Allergic asthma   . Allergic rhinitis due to pollen   . Allergy   . GERD (gastroesophageal reflux disease)   . IBS (irritable bowel syndrome)   . Osteopenia   . Unspecified hypothyroidism     Past Surgical History:  Procedure Laterality Date  . ANAL FISSURE REPAIR  1950's  . BASAL CELL CARCINOMA EXCISION  2000's   nose  . CHOLECYSTECTOMY    . COLONOSCOPY  Multiple   Negative screening exams  . [place] or so   with fissure repair  . TUBAL LIGATION      There were no vitals filed for this visit.       Subjective Assessment - 11/10/16 1541    Subjective Pt has had IBS-diarrhea type off and on for a couple of years. This Sx has changed. When she first had it, she had cramping and diarrhea. Later, pt had constipation and also soft diarrhea and explosive diarrhea, and then 2-3 days without bowel movements. This cycle would occur for a longer cycle but pt is not able to remember. Now this cycle only lasts for one week but repeats weekly.  Since pt has started to perform the exercises taught by the Pelvic PT in [place], pt has noticed she no longer has cramping and has better control with leakage when the stool are soft. Pt is happy about  that improvement. Pt also reports bloating goes with this and it has gotten worse. Daily fluid intake: 2-3 cups water, 2 cups cold brew coffee, 2 cups of club soda with cranberry with Spenda, no tea.  Pad wear depends on the condition of her IBS. 16 diapers / month for protection against disasters. Little pad at night due to urinary leakage. Frequency of urinary: pt feels like she has to go to the bathroom alot when she really does not need.  Pt feels no signal sometimes to go to the bathroom in the morning after she drinks her coffee.  Pt notices that [employer] size cold brew with spenda and lots of milk causes explosive diarrhea and she finds this a blessing because she will not have to worry for a couple days when she has meetings and events because she feel this diarrhea has "cleaned" her out.  Pt also recognize there is a psychological component when she returns from trips , she has to rush to the bathroom to have bowel movements. Denied LBP nor pain with urinary/ bowel function.       Pertinent History Denied fall onto the tailbone, nor injury to pelvic / coccyx. Gynecological Hx: 1 vaginal delievery, episiotomy. Pt has alway had constipation when she was growing up and has undergone surgeries for  fissure/ hemorrhoids. Pt has also had been compulsive overeater from childhood to adulthood until 1978 when she completed a Overeaters Anonymous Program. Pt currently cooks at home, and also eats out.  Pt wants a referral to a dietitian as recommended by her MD to start the FODMAP.     Patient Stated Goals --   Pain Onset --            [facility] PT Assessment - 11/10/16 1541      Assessment   Medical Diagnosis Pelvic Floor dysfunction   Referring Provider Dr. Silvano Rusk     Balance Screen   Has the patient fallen in the past 6 months Yes   How many times? 1  pt fell forward, without injuries      Observation/Other Assessments   Observations severely slumped sitting      Coordination    Gross Motor Movements are Fluid and Coordinated --  diaphramatic excursion present      ROM / Strength   AROM / PROM / Strength --  limited B rotation ~30%             Objective measurements completed on examination: See above findings.        Pelvic Floor Special Questions - 11/10/16 1612    Diastasis Recti 3 finger width below umbilicus, 3 fingers post head lift     External Perineal Exam through clothing    External Palpation abdominal straining with cue for pelvic floor lengthening and contraction           [facility] Adult PT Treatment/Exercise - 11/10/16 1651      Therapeutic Activites    Therapeutic Activities --  see pt instructions                 PT Education - 11/10/16 1623    Education provided Yes   Education Details POC, anatomy, physiology, goals, HEP   Person(s) Educated Patient   Methods Explanation;Demonstration;Tactile cues;Verbal cues;Handout   Comprehension Returned demonstration;Verbalized understanding          PT Short Term Goals - 09/09/16 1008      PT SHORT TERM GOAL #1   Title independent with initial HEP   Time 4   Period Weeks   Status New     PT SHORT TERM GOAL #2   Title understand what foods could irritate her IBS   Time 4   Period Weeks   Status New     PT SHORT TERM GOAL #3   Title understand how to perform abdominal massage to reduce cramping   Time 4   Period Weeks   Status New     PT SHORT TERM GOAL #4   Title lower abdominal pain decreased >/= 25%   Time 4   Period Weeks   Status New     PT SHORT TERM GOAL #5   Title fecal incontinence decreased >/= 25% due to improve anal sphinter control   Time 4   Period Weeks   Status New           PT Long Term Goals - 11/10/16 1647      PT LONG TERM GOAL #1   Title Pt will decrease her PFDI score from 45 % to <40% in order to improve pelvic floor function   Time 12   Period Weeks   Status New     PT LONG TERM GOAL #2   Title Pt will decrease use  of diapers from 12 / month  to < 10 per month in order to improve hygiene, confidence with pelvic floor function   Time 12   Period Weeks   Status New     PT LONG TERM GOAL #3   Title Pt will demo decreased abdominal separation of 3 fingers to < 2 fingers in order to improve motility and progress to deep core strengthening exercises   Time 12   Status New     PT LONG TERM GOAL #4   Title Pt will demo proper body mechanics ( log rolling, proper sitting) to minimize strain on her pelvic and abdominal mm and return to ADLs    Time 12   Period Weeks   Status New     Additional Long Term Goals   Additional Long Term Goals Yes     PT LONG TERM GOAL #6   Title Pt will demo proper coordination of deep core mm and no lumbopelvic perturbation with deep core level 2-3 ( 5 reps) in order to achieve less slumped posture and increase intraabdominal pressure for motility and postural stability   Time 12   Period Weeks   Status New                Plan - Nov 16, 2016 1636    Clinical Impression Statement Pt is a 83 yo female who reports urinary incontinence and bowel issues related to her IBS (constipation/ diarrhea). Pt 's clinical presentations include   limited spinal mobility, diastasis recti, dyscoordination and strength of pelvic floor mm, poor body mechanics which places strain on the abdominal/pelvic floor mm. These are deficits that indicate an ineffective intraabdominal pressure system associated with increased risk for urinary leakage and poor motility for GI function. Plan to assess pelvic floor at future sessions.  Following Tx today, pt demo'd proper body mechancis with log rolling and proper sitting posture to minimize abdominal separation and downward load onto her pelvic floor mm.  Pt was provided two dietitians to consult for better eating habits to enhance GI function.     Clinical Presentation Evolving   Clinical Decision Making Low   Rehab Potential Good   Clinical  Impairments Affecting Rehab Potential IBS   PT Frequency 1x / week   PT Duration 12 weeks   PT Treatment/Interventions Electrical Stimulation;Therapeutic activities;Therapeutic exercise;Neuromuscular re-education;Patient/family education;Scar mobilization;Manual techniques;Taping;Functional mobility training;Stair training;Gait training;Moist Heat;Traction;Manual lymph drainage   PT Next Visit Plan --   PT Home Exercise Plan --   Consulted and Agree with Plan of Care Patient      Patient will benefit from skilled therapeutic intervention in order to improve the following deficits and impairments:  Decreased activity tolerance, Decreased endurance, Decreased strength, Decreased scar mobility, Increased muscle spasms, Pain, Decreased balance, Decreased range of motion, Improper body mechanics, Postural dysfunction  Visit Diagnosis: Muscle weakness (generalized)  Other lack of coordination  Other symptoms and signs involving the musculoskeletal system      G-Codes - 11-16-16 1654    Functional Assessment Tool Used (Outpatient Only) PFDI 45% , clinical judgement   Functional Limitation Self care   Self Care Current Status (G9562) At least 40 percent but less than 60 percent impaired, limited or restricted   Self Care Goal Status (Z3086) At least 20 percent but less than 40 percent impaired, limited or restricted       Problem List Patient Active Problem List   Diagnosis Date Noted  . Dizziness 09/12/2016  . Memory loss 12/02/2015  . Arrhythmia 12/02/2015  . Benign essential tremor  07/20/2015  . Advanced directives, counseling/discussion 12/19/2013  . Routine general medical examination at a health care facility 12/13/2012  . Hypothyroidism 12/12/2011  . Allergic rhinitis due to pollen   . GERD (gastroesophageal reflux disease)   . IBS (irritable bowel syndrome)   . Osteopenia     Jerl Mina ,PT, DPT, E-RYT  11/10/2016, 4:54 PM  [place] MAIN [facility] SERVICES [address] [place], [place], [address] Phone: [phone]   Fax:  [phone]  Name: Ariel Phillips MRN: [id number] Date of Birth: 1932-09-02

## 2016-11-13 ENCOUNTER — Encounter: Payer: Self-pay | Admitting: Internal Medicine

## 2016-11-13 DIAGNOSIS — K589 Irritable bowel syndrome without diarrhea: Secondary | ICD-10-CM

## 2016-11-17 ENCOUNTER — Ambulatory Visit (HOSPITAL_COMMUNITY): Payer: Self-pay | Admitting: Clinical

## 2016-11-18 ENCOUNTER — Ambulatory Visit: Payer: Medicare Other | Admitting: Physical Therapy

## 2016-11-18 DIAGNOSIS — M6281 Muscle weakness (generalized): Secondary | ICD-10-CM

## 2016-11-18 DIAGNOSIS — R29898 Other symptoms and signs involving the musculoskeletal system: Secondary | ICD-10-CM | POA: Diagnosis not present

## 2016-11-18 DIAGNOSIS — R252 Cramp and spasm: Secondary | ICD-10-CM | POA: Diagnosis not present

## 2016-11-18 DIAGNOSIS — R278 Other lack of coordination: Secondary | ICD-10-CM | POA: Diagnosis not present

## 2016-11-18 NOTE — Therapy (Signed)
[place] MAIN [facility] SERVICES [address] [place], [place], [address] Phone: [phone]   Fax:  [phone]  Physical Therapy Treatment  Patient Details  Name: Ariel Phillips MRN: [id number] Date of Birth: 07-12-1932 Referring Provider: Dr. Silvano Rusk  Encounter Date: 11/18/2016      PT End of Session - 11/18/16 0854    Visit Number 2   Number of Visits 10   Date for PT Re-Evaluation 02/02/17   Authorization Type medicare with g-code   PT Start Time 0810   PT Stop Time 0900   PT Time Calculation (min) 50 min   Activity Tolerance Patient tolerated treatment well   Behavior During Therapy [facility] for tasks assessed/performed      Past Medical History:  Diagnosis Date  . Allergic asthma   . Allergic rhinitis due to pollen   . Allergy   . GERD (gastroesophageal reflux disease)   . IBS (irritable bowel syndrome)   . Osteopenia   . Unspecified hypothyroidism     Past Surgical History:  Procedure Laterality Date  . ANAL FISSURE REPAIR  1950's  . BASAL CELL CARCINOMA EXCISION  2000's   nose  . CHOLECYSTECTOMY    . COLONOSCOPY  Multiple   Negative screening exams  . [place] or so   with fissure repair  . TUBAL LIGATION      There were no vitals filed for this visit.      Subjective Assessment - 11/18/16 0823    Subjective Pt reports she is also receiving PT for her balance at another clinic. Pt has not been able to perform her other exercsies during the day 3x/ day as advised but she is able to perform her pelvic exercises at night    Pertinent History Denied fall onto the tailbone, nor injury to pelvic / coccyx. Gynecological Hx: 1 vaginal delievery, episiotomy. Pt has alway had constipation when she was growing up and has undergone surgeries for fissure/ hemorrhoids. Pt has also had been compulsive overeater from childhood to adulthood until 1978 when she completed a Overeaters Anonymous Program. Pt currently  cooks at home, and also eats out.  Pt wants a referral to a dietitian as recommended by her MD to start the FODMAP.              [facility] PT Assessment - 11/18/16 0847      Coordination   Gross Motor Movements are Fluid and Coordinated --  shakiness of leg w/deep core 2 ex.less w/more cues & reps      seated posture: slumped. Cued for upright posture   Bed mobility with head crunch: cued for log rolling with education to minimize straining abdomen and spine. Pelvic floor              Pelvic Floor Special Questions - 11/18/16 0001    Diastasis Recti 1 fingers with post Tx           [facility] Adult PT Treatment/Exercise - 11/18/16 0849      Neuro Re-ed    Neuro Re-ed Details  see pt instructions      Exercises   Exercises --  see pt instructions     Manual Therapy   Manual therapy comments quadriped: w/ shoulder flexion/ trunk rotation B, 10 reps to faciliate closure of DRA                   PT Short Term Goals - 09/09/16 1008  PT SHORT TERM GOAL #1   Title independent with initial HEP   Time 4   Period Weeks   Status New     PT SHORT TERM GOAL #2   Title understand what foods could irritate her IBS   Time 4   Period Weeks   Status New     PT SHORT TERM GOAL #3   Title understand how to perform abdominal massage to reduce cramping   Time 4   Period Weeks   Status New     PT SHORT TERM GOAL #4   Title lower abdominal pain decreased >/= 25%   Time 4   Period Weeks   Status New     PT SHORT TERM GOAL #5   Title fecal incontinence decreased >/= 25% due to improve anal sphinter control   Time 4   Period Weeks   Status New           PT Long Term Goals - 11/10/16 1647      PT LONG TERM GOAL #1   Title Pt will decrease her PFDI score from 45 % to <40% in order to improve pelvic floor function   Time 12   Period Weeks   Status New     PT LONG TERM GOAL #2   Title Pt will decrease use of diapers from 12 / month to < 10 per month  in order to improve hygiene, confidence with pelvic floor function   Time 12   Period Weeks   Status New     PT LONG TERM GOAL #3   Title Pt will demo decreased abdominal separation of 3 fingers to < 2 fingers in order to improve motility and progress to deep core strengthening exercises   Time 12   Status New     PT LONG TERM GOAL #4   Title Pt will demo proper body mechanics ( log rolling, proper sitting) to minimize strain on her pelvic and abdominal mm and return to ADLs    Time 12   Period Weeks   Status New     Additional Long Term Goals   Additional Long Term Goals Yes     PT LONG TERM GOAL #6   Title Pt will demo proper coordination of deep core mm and no lumbopelvic perturbation with deep core level 2-3 ( 5 reps) in order to achieve less slumped posture and increase intraabdominal pressure for motility and postural stability   Time 12   Period Weeks   Status New               Plan - 11/18/16 5400    Clinical Impression Statement Pt demo'd less lower abdominal separation following Tx today. Suspect this Tx will help improve her intraabdominal pressure system for better motility and psotural stability. Progressed to deep core level 2 which pt required excessive cues for more lumbopelvic stability. Also initated scapular/ cervical retraction to promote less forward head posture for better postural stability. Pt continues to benefit from skilled PT.    Rehab Potential Good   Clinical Impairments Affecting Rehab Potential IBS   PT Frequency 1x / week   PT Duration 12 weeks   PT Treatment/Interventions Electrical Stimulation;Therapeutic activities;Therapeutic exercise;Neuromuscular re-education;Patient/family education;Scar mobilization;Manual techniques;Taping;Functional mobility training;Stair training;Gait training;Moist Heat;Traction;Manual lymph drainage   Consulted and Agree with Plan of Care Patient      Patient will benefit from skilled therapeutic intervention  in order to improve the following deficits and impairments:  Decreased activity  tolerance, Decreased endurance, Decreased strength, Decreased scar mobility, Increased muscle spasms, Pain, Decreased balance, Decreased range of motion, Improper body mechanics, Postural dysfunction  Visit Diagnosis: Other lack of coordination  Muscle weakness (generalized)  Other symptoms and signs involving the musculoskeletal system  Cramp and spasm     Problem List Patient Active Problem List   Diagnosis Date Noted  . Dizziness 09/12/2016  . Memory loss 12/02/2015  . Arrhythmia 12/02/2015  . Benign essential tremor 07/20/2015  . Advanced directives, counseling/discussion 12/19/2013  . Routine general medical examination at a health care facility 12/13/2012  . Hypothyroidism 12/12/2011  . Allergic rhinitis due to pollen   . GERD (gastroesophageal reflux disease)   . IBS (irritable bowel syndrome)   . Osteopenia     Jerl Mina ,PT, DPT, E-RYT  11/18/2016, 8:59 AM  [place] MAIN [facility] SERVICES [address] [place], [place], [address] Phone: [phone]   Fax:  [phone]  Name: Ariel Phillips MRN: [id number] Date of Birth: Sep 15, 1932

## 2016-11-18 NOTE — Patient Instructions (Addendum)
band under heels while laying on back w/ knees bent      1) "W" exercise  10 reps x 2 sets   COUNT ALOUD   Band is placed under feet, knees bent, feet are hip width apart Hold band with thumbs point out, keep upper arm and elbow touching the bed the whole time  - inhale and then exhale pull bands by bending elbows hands move in a "w"  (feel shoulder blades squeezing)    2) You are now ready to begin training the deep core muscles system: diaphragm, transverse abdominis, pelvic floor . These muscles must work together as a team.           The key to these exercises to train the brain to coordinate the timing of these muscles and to have them turn on for long periods of time to hold you upright against gravity (especially important if you are on your feet all day).These muscles are postural muscles and play a role stabilizing your spine and bodyweight. By doing these repetitions slowly and correctly instead of doing crunches, you will achieve a flatter belly without a lower pooch. You are also placing your spine in a more neutral position and breathing properly which in turn, decreases your risk for problems related to your pelvic floor, abdominal, and low back such as pelvic organ prolapse, hernias, diastasis recti (separation of superficial muscles), disk herniations, spinal fractures. These exercises set a solid foundation for you to later progress to resistance/ strength training with therabands and weights and return to other typical fitness exercises with a stronger deeper core.    Do level 1 : 10 reps to a count of inhale 1-2 pause, exhale 2-1 pause to minimize hyperventilation   Do level 2: 10 reps (left and right = 1 rep) x 3 sets , 2 x day Do not progress to level 3 for 3-4 weeks. You know you are ready when you do not have any rocking of pelvis nor arching in your back     3) Chin tucks and shoulder squeezes in a upright sitting position, feet on the floor, back not leaning  against chair  Throughout the day

## 2016-11-21 DIAGNOSIS — J301 Allergic rhinitis due to pollen: Secondary | ICD-10-CM | POA: Diagnosis not present

## 2016-11-23 ENCOUNTER — Encounter: Payer: Self-pay | Admitting: Physical Therapy

## 2016-11-24 DIAGNOSIS — H8111 Benign paroxysmal vertigo, right ear: Secondary | ICD-10-CM | POA: Diagnosis not present

## 2016-11-24 DIAGNOSIS — M6281 Muscle weakness (generalized): Secondary | ICD-10-CM | POA: Diagnosis not present

## 2016-11-24 DIAGNOSIS — R42 Dizziness and giddiness: Secondary | ICD-10-CM | POA: Diagnosis not present

## 2016-11-24 DIAGNOSIS — R2681 Unsteadiness on feet: Secondary | ICD-10-CM | POA: Diagnosis not present

## 2016-11-24 DIAGNOSIS — R293 Abnormal posture: Secondary | ICD-10-CM | POA: Diagnosis not present

## 2016-11-25 ENCOUNTER — Ambulatory Visit: Payer: Medicare Other | Admitting: Physical Therapy

## 2016-11-25 ENCOUNTER — Encounter: Payer: Medicare Other | Attending: Internal Medicine | Admitting: Dietician

## 2016-11-25 ENCOUNTER — Encounter: Payer: Self-pay | Admitting: Dietician

## 2016-11-25 VITALS — Ht 63.0 in | Wt 135.9 lb

## 2016-11-25 DIAGNOSIS — Z713 Dietary counseling and surveillance: Secondary | ICD-10-CM | POA: Diagnosis not present

## 2016-11-25 DIAGNOSIS — K589 Irritable bowel syndrome without diarrhea: Secondary | ICD-10-CM | POA: Insufficient documentation

## 2016-11-25 DIAGNOSIS — Z6824 Body mass index (BMI) 24.0-24.9, adult: Secondary | ICD-10-CM | POA: Insufficient documentation

## 2016-11-25 DIAGNOSIS — J301 Allergic rhinitis due to pollen: Secondary | ICD-10-CM | POA: Diagnosis not present

## 2016-11-25 DIAGNOSIS — K582 Mixed irritable bowel syndrome: Secondary | ICD-10-CM

## 2016-11-25 NOTE — Progress Notes (Signed)
Medical Nutrition Therapy: Visit start time: 2841  end time: 1130  Assessment:  Diagnosis: IBS Past medical history: GERD, osteopenia, hypothyroidism, arrhythmia, see chart Psychosocial issues/ stress concerns: none Preferred learning method:  . Auditory . Visual  Current weight: 135.9lb  Height: 5\' 3"  Medications, supplements: synthroid, aspirin, see chart for full list  Progress and evaluation: Patient is interested in learning about the FODMAP diet as a way to manage IBS symptoms. Heard about the diet from her gastroenterologist. She provided a pre-completed 6 day food log for review which reveals her to be eating many of the same foods each day and several foods that are considered "high FODMAP." States she has taken out a few high FODMAP food items from her diet since learning about/researching the diet and, along with doing pelvic floor exercises, reports improved GI symptoms. Bowel movements occur every 2-3 days and follows with loose or semi-formed stool (used to be diarrhea). She suspects she may be gluten and/or lactose intolerant. Would like to follow through with the FODMAP elimination diet for the recommended 2-6wks.   Physical activity: working with physical therapist at least 3x/wk: balance, elliptical, weights, pelvic floor exercises for 10-51min each session  Dietary Intake:  Usual eating pattern includes 3 meals and 2-3 snacks per day. Dining out frequency: 0-1 meals per week.  Breakfast: cereal 1/2 cup rice krispys + 1/2 cup natures path corn flakes + almond milk + 4oz cranberry juice + 1/4 cup chopped mixed nuts/ prunes/apricots Snack: cold brew coffee + 2% milk & splenda Lunch: 1/4 cup non-fat greek yogurt+ 1/2 cup non-fat regular yogurt + peach/grapes + 2Tbs chopped nuts + ginger + 1 cup low sodium V8 Snack: club soda flavored with cranberry juice + small popcorn Supper: psole (pork, hominy, onions, green enchilada sauce, spices), 4oz steak + baked potato + cucumber  salad, 1/2 can of chicken soup + 4 low sodium triscuts with cheese + cucumber salad, middle [place] style chicken + lettuce/tomato + tahini in a pita pocket, [employer] tomato soup + 1/2 apple/chicken salad sandwich + apple Snack: banana + almond milk Beverages: listed above, G2 to rehydrate  Nutrition Care Education: Topics covered: parameters of the FODMAP diet and the elimination diet portion of the diet, identifying high FODMAP foods in current diet, s/sx of GI upset, finding substitutions for preferred foods rather than eliminating them IE yogurt, daily fiber recommendations for adult females Basic nutrition: basic food groups, appropriate nutrient balance, appropriate meal and snack schedule, general nutrition guidelines Advanced nutrition: recipe modification, cooking techniques, dining out, food label reading Other lifestyle changes: benefits of making changes, identifying habits that need to change  Nutritional Diagnosis:  [place]-1.4 Altered GI function As related to IBS.  As evidenced by patient report of irregular bowel movements, diarrhea, constipation, bloating.  Intervention: Discussion as noted above. Information on the [place] resources was relayed. Business card with email/ office phone was provided for patient to contact with questions/concerns. Plan is for patient to decide when she will officially begin the FODMAP diet elimination phase and contact RD to set up an appointment (during elimination phase/ at the start of the reintroduction period).  Education Materials given:  . High FODMAP food list . IBS Nutrition Therapy . List of recommended reintroduction order for high FODMAP foods  Learner/ who was taught:  . Patient   Level of understanding: Marland Kitchen Verbalizes/ demonstrates competency  Demonstrated degree of understanding via:   Teach back Learning barriers: . None  Willingness to learn/  readiness for change: . Eager, change in  progress  Monitoring and Evaluation:  Dietary intake, exercise, IBS symptoms, and body weight      follow up: prn

## 2016-11-30 ENCOUNTER — Encounter: Payer: Self-pay | Admitting: Physical Therapy

## 2016-12-01 ENCOUNTER — Ambulatory Visit: Payer: Medicare Other | Attending: Internal Medicine | Admitting: Physical Therapy

## 2016-12-01 DIAGNOSIS — R252 Cramp and spasm: Secondary | ICD-10-CM | POA: Diagnosis not present

## 2016-12-01 DIAGNOSIS — M6281 Muscle weakness (generalized): Secondary | ICD-10-CM

## 2016-12-01 DIAGNOSIS — R29898 Other symptoms and signs involving the musculoskeletal system: Secondary | ICD-10-CM | POA: Diagnosis not present

## 2016-12-01 DIAGNOSIS — R278 Other lack of coordination: Secondary | ICD-10-CM

## 2016-12-01 NOTE — Patient Instructions (Signed)
Log rolling   Deep core level 2   "w" with yellow band Hand placed by hip a few inches away  Pull band away from shoulder like playing an accordian

## 2016-12-01 NOTE — Therapy (Signed)
[place] MAIN [facility] SERVICES [address] [place], [place], [address] Phone: [phone]   Fax:  [phone]  Physical Therapy Treatment  Patient Details  Name: Ariel Phillips MRN: [id number] Date of Birth: [birth year] Referring Provider: Dr. Silvano Rusk  Encounter Date: 12/01/2016      PT End of Session - 12/01/16 1629    Visit Number 3   Number of Visits 10   Date for PT Re-Evaluation 02/02/17   Authorization Type medicare with g-code   PT Start Time 1545   PT Stop Time 1630   PT Time Calculation (min) 45 min   Activity Tolerance Patient tolerated treatment well   Behavior During Therapy [facility] for tasks assessed/performed      Past Medical History:  Diagnosis Date  . Allergic asthma   . Allergic rhinitis due to pollen   . Allergy   . GERD (gastroesophageal reflux disease)   . IBS (irritable bowel syndrome)   . Osteopenia   . Unspecified hypothyroidism     Past Surgical History:  Procedure Laterality Date  . ANAL FISSURE REPAIR  1950's  . BASAL CELL CARCINOMA EXCISION  2000's   nose  . CHOLECYSTECTOMY    . COLONOSCOPY  Multiple   Negative screening exams  . [place] or so   with fissure repair  . TUBAL LIGATION      There were no vitals filed for this visit.      Subjective Assessment - 12/01/16 1547    Subjective Pt has been practicing her HEP. Pt notices her stomach is getting flatter. The fecal leakage is better; pt has been able to control and waited to have the urge to go and has found her bowels to eliminate more easily.  But today, pt has felt a backslide in her Sx.              [facility] PT Assessment - 12/01/16 1623      Coordination   Gross Motor Movements are Fluid and Coordinated --  deep core level 2: proper breathing coordination; LLE poor      PROM   Overall PROM  --  hip IR/flexLLE w/ pain. piriformis with pelvic rocking LLE                  Pelvic Floor Special  Questions - 12/01/16 0001    Diastasis Recti no fingers width separation           [facility] Adult PT Treatment/Exercise - 12/01/16 1627      Therapeutic Activites    Therapeutic Activities --  corrections to HEP     Manual Therapy   Manual therapy comments LLE long axis distraction, AP mobs Grade II hip flex/ ER to faciliate more ROM and better control of LLE in deep core ex                PT Education - 12/01/16 1628    Education provided Yes   Education Details HEP   Person(s) Educated Patient   Methods Explanation;Demonstration;Tactile cues;Verbal cues;Handout   Comprehension Returned demonstration;Verbalized understanding          PT Short Term Goals - 09/09/16 1008      PT SHORT TERM GOAL #1   Title independent with initial HEP   Time 4   Period Weeks   Status New     PT SHORT TERM GOAL #2   Title understand what foods could irritate her IBS   Time  4   Period Weeks   Status New     PT SHORT TERM GOAL #3   Title understand how to perform abdominal massage to reduce cramping   Time 4   Period Weeks   Status New     PT SHORT TERM GOAL #4   Title lower abdominal pain decreased >/= 25%   Time 4   Period Weeks   Status New     PT SHORT TERM GOAL #5   Title fecal incontinence decreased >/= 25% due to improve anal sphinter control   Time 4   Period Weeks   Status New           PT Long Term Goals - 11/10/16 1647      PT LONG TERM GOAL #1   Title Pt will decrease her PFDI score from 45 % to <40% in order to improve pelvic floor function   Time 12   Period Weeks   Status New     PT LONG TERM GOAL #2   Title Pt will decrease use of diapers from 12 / month to < 10 per month in order to improve hygiene, confidence with pelvic floor function   Time 12   Period Weeks   Status New     PT LONG TERM GOAL #3   Title Pt will demo decreased abdominal separation of 3 fingers to < 2 fingers in order to improve motility and progress to deep core  strengthening exercises   Time 12   Status New     PT LONG TERM GOAL #4   Title Pt will demo proper body mechanics ( log rolling, proper sitting) to minimize strain on her pelvic and abdominal mm and return to ADLs    Time 12   Period Weeks   Status New     Additional Long Term Goals   Additional Long Term Goals Yes     PT LONG TERM GOAL #6   Title Pt will demo proper coordination of deep core mm and no lumbopelvic perturbation with deep core level 2-3 ( 5 reps) in order to achieve less slumped posture and increase intraabdominal pressure for motility and postural stability   Time 12   Period Weeks   Status New               Plan - 12/01/16 1629    Clinical Impression Statement Pt had no abdominal separation today which indicates good carry over from last Tx.  Pt required cuing to perform correct technique for HEP. Pt demo'd more stability with LLE in deep core level 2 exercise following manual Tx. Pt reports improvement with fecal continence but will continue to benefit from skilled PT along with simple HEP for compliance and understanding/memory retention.    Rehab Potential Good   Clinical Impairments Affecting Rehab Potential IBS   PT Frequency 1x / week   PT Duration 12 weeks   PT Treatment/Interventions Electrical Stimulation;Therapeutic activities;Therapeutic exercise;Neuromuscular re-education;Patient/family education;Scar mobilization;Manual techniques;Taping;Functional mobility training;Stair training;Gait training;Moist Heat;Traction;Manual lymph drainage   Consulted and Agree with Plan of Care Patient      Patient will benefit from skilled therapeutic intervention in order to improve the following deficits and impairments:  Decreased activity tolerance, Decreased endurance, Decreased strength, Decreased scar mobility, Increased muscle spasms, Pain, Decreased balance, Decreased range of motion, Improper body mechanics, Postural dysfunction  Visit Diagnosis: Other  lack of coordination  Muscle weakness (generalized)  Other symptoms and signs involving the musculoskeletal system  Cramp and  spasm     Problem List Patient Active Problem List   Diagnosis Date Noted  . Dizziness 09/12/2016  . Memory loss 12/02/2015  . Arrhythmia 12/02/2015  . Benign essential tremor 07/20/2015  . Advanced directives, counseling/discussion 12/19/2013  . Routine general medical examination at a health care facility 12/13/2012  . Hypothyroidism 12/12/2011  . Allergic rhinitis due to pollen   . GERD (gastroesophageal reflux disease)   . IBS (irritable bowel syndrome)   . Osteopenia     Jerl Mina ,PT, DPT, E-RYT  12/01/2016, 4:49 PM  [place] MAIN [facility] SERVICES [address] [place], [place], [address] Phone: [phone]   Fax:  [phone]  Name: Ariel Phillips MRN: [id number] Date of Birth: [birth year]

## 2016-12-05 DIAGNOSIS — J301 Allergic rhinitis due to pollen: Secondary | ICD-10-CM | POA: Diagnosis not present

## 2016-12-07 ENCOUNTER — Encounter: Payer: Self-pay | Admitting: Physical Therapy

## 2016-12-08 DIAGNOSIS — H2513 Age-related nuclear cataract, bilateral: Secondary | ICD-10-CM | POA: Diagnosis not present

## 2016-12-12 DIAGNOSIS — J301 Allergic rhinitis due to pollen: Secondary | ICD-10-CM | POA: Diagnosis not present

## 2016-12-13 ENCOUNTER — Ambulatory Visit: Payer: Self-pay | Admitting: Internal Medicine

## 2016-12-13 DIAGNOSIS — R293 Abnormal posture: Secondary | ICD-10-CM | POA: Diagnosis not present

## 2016-12-13 DIAGNOSIS — M6281 Muscle weakness (generalized): Secondary | ICD-10-CM | POA: Diagnosis not present

## 2016-12-13 DIAGNOSIS — R42 Dizziness and giddiness: Secondary | ICD-10-CM | POA: Diagnosis not present

## 2016-12-13 DIAGNOSIS — H8111 Benign paroxysmal vertigo, right ear: Secondary | ICD-10-CM | POA: Diagnosis not present

## 2016-12-13 DIAGNOSIS — R2681 Unsteadiness on feet: Secondary | ICD-10-CM | POA: Diagnosis not present

## 2016-12-16 ENCOUNTER — Ambulatory Visit: Payer: Medicare Other | Admitting: Physical Therapy

## 2016-12-21 ENCOUNTER — Encounter: Payer: Self-pay | Admitting: Physical Therapy

## 2016-12-21 DIAGNOSIS — J301 Allergic rhinitis due to pollen: Secondary | ICD-10-CM | POA: Diagnosis not present

## 2016-12-22 DIAGNOSIS — R2681 Unsteadiness on feet: Secondary | ICD-10-CM | POA: Diagnosis not present

## 2016-12-22 DIAGNOSIS — H8111 Benign paroxysmal vertigo, right ear: Secondary | ICD-10-CM | POA: Diagnosis not present

## 2016-12-22 DIAGNOSIS — R42 Dizziness and giddiness: Secondary | ICD-10-CM | POA: Diagnosis not present

## 2016-12-22 DIAGNOSIS — M6281 Muscle weakness (generalized): Secondary | ICD-10-CM | POA: Diagnosis not present

## 2016-12-22 DIAGNOSIS — R293 Abnormal posture: Secondary | ICD-10-CM | POA: Diagnosis not present

## 2016-12-28 ENCOUNTER — Encounter: Payer: Self-pay | Admitting: Physical Therapy

## 2016-12-29 ENCOUNTER — Encounter: Payer: Self-pay | Admitting: Internal Medicine

## 2016-12-29 ENCOUNTER — Ambulatory Visit: Payer: Medicare Other | Admitting: Physical Therapy

## 2016-12-29 DIAGNOSIS — R29898 Other symptoms and signs involving the musculoskeletal system: Secondary | ICD-10-CM | POA: Diagnosis not present

## 2016-12-29 DIAGNOSIS — R42 Dizziness and giddiness: Secondary | ICD-10-CM | POA: Diagnosis not present

## 2016-12-29 DIAGNOSIS — R2681 Unsteadiness on feet: Secondary | ICD-10-CM | POA: Diagnosis not present

## 2016-12-29 DIAGNOSIS — M6281 Muscle weakness (generalized): Secondary | ICD-10-CM

## 2016-12-29 DIAGNOSIS — R252 Cramp and spasm: Secondary | ICD-10-CM

## 2016-12-29 DIAGNOSIS — R293 Abnormal posture: Secondary | ICD-10-CM | POA: Diagnosis not present

## 2016-12-29 DIAGNOSIS — R278 Other lack of coordination: Secondary | ICD-10-CM

## 2016-12-29 DIAGNOSIS — H8111 Benign paroxysmal vertigo, right ear: Secondary | ICD-10-CM | POA: Diagnosis not present

## 2016-12-29 MED ORDER — LEVOTHYROXINE SODIUM 75 MCG PO TABS: 75.0000 ug | ORAL_TABLET | Freq: Every day | ORAL | 3 refills | Status: DC

## 2016-12-29 NOTE — Patient Instructions (Signed)
Open book      Seated and laying on side    Body scan ( emailed(

## 2016-12-29 NOTE — Therapy (Signed)
[place] MAIN [facility] SERVICES [address] [place], [place], [address] Phone: [phone]   Fax:  [phone]  Physical Therapy Treatment  Patient Details  Name: GERILYN STARGELL MRN: [id number] Date of Birth: 15-Oct-1932 Referring Provider: Dr. Silvano Rusk  Encounter Date: 12/29/2016      PT End of Session - 12/29/16 2136    Visit Number 4   Number of Visits 10   Date for PT Re-Evaluation 02/02/17   Authorization Type medicare with g-code   PT Start Time 1007   PT Stop Time 1110   PT Time Calculation (min) 63 min   Activity Tolerance Patient tolerated treatment well   Behavior During Therapy [facility] for tasks assessed/performed      Past Medical History:  Diagnosis Date  . Allergic asthma   . Allergic rhinitis due to pollen   . Allergy   . GERD (gastroesophageal reflux disease)   . IBS (irritable bowel syndrome)   . Osteopenia   . Unspecified hypothyroidism     Past Surgical History:  Procedure Laterality Date  . ANAL FISSURE REPAIR  1950's  . BASAL CELL CARCINOMA EXCISION  2000's   nose  . CHOLECYSTECTOMY    . COLONOSCOPY  Multiple   Negative screening exams  . [place] or so   with fissure repair  . TUBAL LIGATION      There were no vitals filed for this visit.      Subjective Assessment - 12/29/16 1014    Subjective Pt has had 3-4 diarrhea episodes the past 2 weeks which is less than before. Pt feels her diet is helping. Pt has been doing her pelvic floor and deep core exercises.             [facility] PT Assessment - 12/29/16 2132      Observation/Other Assessments   Observations rounded shoulders, thoracic kyphosis  pt appeared more upright and with brighter affect post Tx     Palpation   Spinal mobility increased thoracic hyppomobility T3-12, L mm tensions > R                   Pelvic Floor Special Questions - 12/29/16 2130    Diastasis Recti 1 fingers width   External Perineal  Exam pt consented   Skin Integrity --  specks/ particles of feces over diaper/ gluts   External Palpation observed correct coordination and lengthening of pelvic floor mm            [facility] Adult PT Treatment/Exercise - 12/29/16 2134      Neuro Re-ed    Neuro Re-ed Details  body scan/ mindfulness      Modalities   Modalities --  heat over thoracic, neck  -10 min     Manual Therapy   Manual therapy comments PA mobs Grade II along T3-12 , STM along interspinals                 PT Education - 12/29/16 2135    Education provided Yes   Education Details HEP   Person(s) Educated Patient   Methods Explanation;Demonstration;Tactile cues;Verbal cues;Handout   Comprehension Returned demonstration;Verbalized understanding          PT Short Term Goals - 09/09/16 1008      PT SHORT TERM GOAL #1   Title independent with initial HEP   Time 4   Period Weeks   Status New     PT SHORT TERM GOAL #2  Title understand what foods could irritate her IBS   Time 4   Period Weeks   Status New     PT SHORT TERM GOAL #3   Title understand how to perform abdominal massage to reduce cramping   Time 4   Period Weeks   Status New     PT SHORT TERM GOAL #4   Title lower abdominal pain decreased >/= 25%   Time 4   Period Weeks   Status New     PT SHORT TERM GOAL #5   Title fecal incontinence decreased >/= 25% due to improve anal sphinter control   Time 4   Period Weeks   Status New           PT Long Term Goals - 12/29/16 2141      PT LONG TERM GOAL #1   Title Pt will decrease her PFDI score from 45 % to <40% in order to improve pelvic floor function   Time 12   Period Weeks   Status On-going     PT LONG TERM GOAL #2   Title Pt will decrease use of diapers from 12 / month to < 10 per month in order to improve hygiene, confidence with pelvic floor function   Time 12   Period Weeks   Status On-going     PT LONG TERM GOAL #3   Title Pt will demo decreased  abdominal separation of 3 fingers to < 2 fingers in order to improve motility and progress to deep core strengthening exercises   Time 12   Status Achieved     PT LONG TERM GOAL #4   Title Pt will demo proper body mechanics ( log rolling, proper sitting) to minimize strain on her pelvic and abdominal mm and return to ADLs    Time 12   Period Weeks   Status Partially Met     PT LONG TERM GOAL #5   Title Pt will be cimpliant with relaxation training ( mindfulness technique) for one week to improve digestion    Time 12   Period Weeks   Status New   Target Date 12/29/16     PT LONG TERM GOAL #6   Title Pt will demo proper coordination of deep core mm and no lumbopelvic perturbation with deep core level 2-3 ( 5 reps) in order to achieve less slumped posture and increase intraabdominal pressure for motility and postural stability   Time 12   Period Weeks   Status New               Plan - 12/29/16 2138    Clinical Impression Statement Pt continues to show improved deep core strength but required cues for body mechanics that place less strain on her spine, abdomen, and pelvic floor. Assessed externally rectal contractions which appeared correct. Addressed thoracic hypomobility as pt has slumped posture with manual Tx to decrease mm tensions and increase spinal mobility.  Added relaxation training with mindfulness technique while pt received moist heat Tx after which pt appeared to have brighter affect after both Tx.  Relaxation may have an impact on pt's poor GI function by triggering vagal innervation. Pt continues to benefit from skilled PT.    Rehab Potential Good   Clinical Impairments Affecting Rehab Potential IBS   PT Frequency 1x / week   PT Duration 12 weeks   PT Treatment/Interventions Electrical Stimulation;Therapeutic activities;Therapeutic exercise;Neuromuscular re-education;Patient/family education;Scar mobilization;Manual techniques;Taping;Functional mobility  training;Stair training;Gait training;Moist Heat;Traction;Manual lymph drainage  Consulted and Agree with Plan of Care Patient      Patient will benefit from skilled therapeutic intervention in order to improve the following deficits and impairments:  Decreased activity tolerance, Decreased endurance, Decreased strength, Decreased scar mobility, Increased muscle spasms, Pain, Decreased balance, Decreased range of motion, Improper body mechanics, Postural dysfunction  Visit Diagnosis: Other lack of coordination  Muscle weakness (generalized)  Other symptoms and signs involving the musculoskeletal system  Cramp and spasm     Problem List Patient Active Problem List   Diagnosis Date Noted  . Dizziness 09/12/2016  . Memory loss 12/02/2015  . Arrhythmia 12/02/2015  . Benign essential tremor 07/20/2015  . Advanced directives, counseling/discussion 12/19/2013  . Routine general medical examination at a health care facility 12/13/2012  . Hypothyroidism 12/12/2011  . Allergic rhinitis due to pollen   . GERD (gastroesophageal reflux disease)   . IBS (irritable bowel syndrome)   . Osteopenia     Jerl Mina ,PT, DPT, E-RYT  12/29/2016, 9:43 PM  [place] MAIN [facility] SERVICES [address] [place], [place], [address] Phone: [phone]   Fax:  [phone]  Name: LAKETHA LEOPARD MRN: [id number] Date of Birth: Apr 21, 1933

## 2016-12-30 ENCOUNTER — Ambulatory Visit (INDEPENDENT_AMBULATORY_CARE_PROVIDER_SITE_OTHER): Payer: Medicare Other | Admitting: Internal Medicine

## 2016-12-30 ENCOUNTER — Encounter: Payer: Self-pay | Admitting: Internal Medicine

## 2016-12-30 VITALS — BP 110/70 | HR 82 | Temp 97.4°F | Ht 63.75 in | Wt 133.0 lb

## 2016-12-30 DIAGNOSIS — F39 Unspecified mood [affective] disorder: Secondary | ICD-10-CM

## 2016-12-30 DIAGNOSIS — Z23 Encounter for immunization: Secondary | ICD-10-CM | POA: Diagnosis not present

## 2016-12-30 DIAGNOSIS — R413 Other amnesia: Secondary | ICD-10-CM | POA: Diagnosis not present

## 2016-12-30 DIAGNOSIS — K589 Irritable bowel syndrome without diarrhea: Secondary | ICD-10-CM

## 2016-12-30 DIAGNOSIS — Z7189 Other specified counseling: Secondary | ICD-10-CM | POA: Diagnosis not present

## 2016-12-30 DIAGNOSIS — E039 Hypothyroidism, unspecified: Secondary | ICD-10-CM | POA: Diagnosis not present

## 2016-12-30 DIAGNOSIS — Z0000 Encounter for general adult medical examination without abnormal findings: Secondary | ICD-10-CM | POA: Diagnosis not present

## 2016-12-30 NOTE — Assessment & Plan Note (Signed)
I have personally reviewed the Medicare Annual Wellness questionnaire and have noted 1. The patient's medical and social history 2. Their use of alcohol, tobacco or illicit drugs 3. Their current medications and supplements 4. The patient's functional ability including ADL's, fall risks, home safety risks and hearing or visual             impairment. 5. Diet and physical activities 6. Evidence for depression or mood disorders  The patients weight, height, BMI and visual acuity have been recorded in the chart I have made referrals, counseling and provided education to the patient based review of the above and I have provided the pt with a written personalized care plan for preventive services.  I have provided you with a copy of your personalized plan for preventive services. Please take the time to review along with your updated medication list.  Will update pneumovax and flu vaccine No more cancer screening Tries to exercise

## 2016-12-30 NOTE — Assessment & Plan Note (Signed)
MDD in notes from LCSW---but seems to be only some dysthymia Not clear that meds would be indicated No Rx for now

## 2016-12-30 NOTE — Assessment & Plan Note (Signed)
Euthyroid Recent labs fine

## 2016-12-30 NOTE — Patient Instructions (Signed)
Let me know if your mood or memory issues seem to get worse.

## 2016-12-30 NOTE — Assessment & Plan Note (Signed)
Has DNR 

## 2016-12-30 NOTE — Assessment & Plan Note (Signed)
Doesn't seem to have progressed from last year Probably [facility]CI Discussed neuro consult--she wishes to hold off

## 2016-12-30 NOTE — Assessment & Plan Note (Signed)
Working with nutritionist on [employer]

## 2016-12-30 NOTE — Addendum Note (Signed)
Addended by: Pilar Grammes on: 12/30/2016 11:46 AM   Modules accepted: Orders

## 2016-12-30 NOTE — Progress Notes (Signed)
Subjective:    Patient ID: Ariel Phillips, female    DOB: [birth year], 83 y.o.   MRN: [id number]  HPI Here for Medicare wellness visit and follow up of chronic health conditions Reviewed form and advanced directives Reviewed other doctors Rare alcohol No tobacco Tries to exercise regularly [place] once on train platform at [employer] and tripped and fell flat on face. Chipped tooth and broke glasses Vision is fine Has hearing aides Ongoing memory problems and mild mood issues Independent with instrumental ADLs  Did go to a dietician for her IBS Found this very helpful Gave FODMAP diet--plans to eliminate all and then challenge (will do after she gets back)  Worried about "creeping dementia" Seems to be worse then a year ago Has usual AM routine---has to really think about it or will forget a part of it [place] with daughter ---stated "how can you talk to your daughter like that" Forgets appointments No change in function Loses things though  Has been seeing counselor for mood Finished with her know Can't see notes--she is concerned about depression Intermittent down times--but like once a week or so Increased sleep Avoids things she needs to do---but will read her Kindle Sleeps well but a lot. Gets up very early (4:30AM), eats breakfast and then goes back to sleep till 8-9am. Falls asleep easily at midnight. Some daytime tired feeling but doesn't fall asleep  Continues on the thyroid No skin, hair or nail problems Energy issues are not striking  Continues on the allergy meds Getting immunotherapy from Dr Tami Ribas  Current Outpatient Prescriptions on File Prior to Visit  Medication Sig Dispense Refill  . aspirin 81 MG tablet Take 81 mg by mouth daily.     Marland Kitchen azelastine (ASTELIN) 0.1 % nasal spray SPRAY 1 SPRAY INTO BOTH NOSTRILS TWICE A DAY (Patient taking differently: SPRAY 1 SPRAY INTO BOTH NOSTRILS once A DAY) 30 mL 11  . carboxymethylcellulose (REFRESH PLUS) 0.5  % SOLN 2 drops daily as needed.    Marland Kitchen EPINEPHrine 0.3 mg/0.3 mL IJ SOAJ injection Inject 0.3 mg into the muscle once. Use prn, takes allergy shots and has this on hand for adverse reactions    . ketotifen (ZADITOR) 0.025 % ophthalmic solution 1 drop daily.    Marland Kitchen levothyroxine (SYNTHROID, LEVOTHROID) 75 MCG tablet Take 1 tablet (75 mcg total) by mouth daily before breakfast. 90 tablet 3  . loratadine (CLARITIN) 10 MG tablet Take 10 mg by mouth daily.    Marland Kitchen PRESCRIPTION MEDICATION     . PROAIR HFA 108 (90 BASE) MCG/ACT inhaler INHALE 2 PUFFS INTO THE LUNGS 3 (THREE) TIMES DAILY AS NEEDED. 8.5 each 0   No current facility-administered medications on file prior to visit.     Allergies  Allergen Reactions  . Penicillins Diarrhea  . Latex Rash    Questionable allergy per patient, rash developed at site of latex band-aid placed after toenail removal    Past Medical History:  Diagnosis Date  . Allergic asthma   . Allergic rhinitis due to pollen   . Allergy   . GERD (gastroesophageal reflux disease)   . IBS (irritable bowel syndrome)   . Osteopenia   . Unspecified hypothyroidism     Past Surgical History:  Procedure Laterality Date  . ANAL FISSURE REPAIR  1950's  . BASAL CELL CARCINOMA EXCISION  2000's   nose  . CHOLECYSTECTOMY    . COLONOSCOPY  Multiple   Negative screening exams  . [place] or  so   with fissure repair  . TUBAL LIGATION      Family History  Problem Relation Age of Onset  . COPD Mother   . Cancer Mother        unclear diagnosis  . Transient ischemic attack Mother   . Diabetes Sister   . Hypertension Sister   . Stroke Other   . Cancer Paternal Grandmother        ?breast  . Heart disease Neg Hx     Social History   Social History  . Marital status: Divorced    Spouse name: N/A  . Number of children: 1  . Years of education: N/A   Occupational History  . Retired historian--consultant    Social History Main Topics  . Smoking status:  Never Smoker  . Smokeless tobacco: Never Used  . Alcohol use Yes     Comment: rare wine  . Drug use: No  . Sexual activity: Not on file   Other Topics Concern  . Not on file   Social History Narrative   Divorced and retired , moved from [place], [place]   1 Daughter in [place]   Has living will   Daughter has health care POA.---[name]   Requests DNR--order done 06/19/12   Would not want prolonged mechanical ventilation   May accept tube feeds temporarily but not if cognitively unaware   Review of Systems Had vertigo last year--- better with repositioning procedure from ENT Sleeps okay overall Appetite is okay Weight fairly stable Wears seat belt Teeth okay--keeps up with dentist No recent heartburn. Had sense of swallowing issue with water in past--gone recently Same regular bowel problems No urinary problems No suspicious skin lesions---seborrheic keratoses. No derm visits Seeing 2 PT ---one for balance, the other for pelvic floor exercises (not working)    Objective:   Physical Exam  Constitutional: She is oriented to person, place, and time. She appears well-nourished. No distress.  HENT:  Mouth/Throat: Oropharynx is clear and moist. No oropharyngeal exudate.  Neck: No thyromegaly present.  Cardiovascular: Normal rate, regular rhythm, normal heart sounds and intact distal pulses.  Exam reveals no gallop.   No murmur heard. Pulmonary/Chest: Effort normal and breath sounds normal. No respiratory distress. She has no wheezes. She has no rales.  Abdominal: Soft. There is no tenderness.  Musculoskeletal: She exhibits no edema or tenderness.  Lymphadenopathy:    She has no cervical adenopathy.  Neurological: She is alert and oriented to person, place, and time.  President-- "Dwaine Deter, [place]" [phone] [id number] Recall 3/3  Skin: No rash noted. No erythema.  Psychiatric:  Not really depressed          Assessment & Plan:

## 2017-01-09 DIAGNOSIS — H66002 Acute suppurative otitis media without spontaneous rupture of ear drum, left ear: Secondary | ICD-10-CM | POA: Diagnosis not present

## 2017-01-09 DIAGNOSIS — J301 Allergic rhinitis due to pollen: Secondary | ICD-10-CM | POA: Diagnosis not present

## 2017-01-09 DIAGNOSIS — H6123 Impacted cerumen, bilateral: Secondary | ICD-10-CM | POA: Diagnosis not present

## 2017-01-12 ENCOUNTER — Ambulatory Visit: Payer: Medicare Other | Attending: Internal Medicine | Admitting: Physical Therapy

## 2017-01-12 DIAGNOSIS — M6281 Muscle weakness (generalized): Secondary | ICD-10-CM | POA: Diagnosis not present

## 2017-01-12 DIAGNOSIS — R278 Other lack of coordination: Secondary | ICD-10-CM | POA: Diagnosis not present

## 2017-01-12 DIAGNOSIS — R29898 Other symptoms and signs involving the musculoskeletal system: Secondary | ICD-10-CM | POA: Insufficient documentation

## 2017-01-12 DIAGNOSIS — R252 Cramp and spasm: Secondary | ICD-10-CM | POA: Diagnosis not present

## 2017-01-12 NOTE — Patient Instructions (Addendum)
  Seated: Elbows pull back and hand slides on thighs chin tucks to keep ears over shoulders  10 x 3      Wall pushing  For stronger activation in legs,  Stand one foot more forward than the other , toes pointed forward, hip width apart   Palm wider than shoulders, elbows down to floor Front knee behind toes   10 reps and switch foot  ____ Modifying your balance PT exercise With chest row band:  For stronger activation in legs,  t Stand one foot more forward than the other, toes pointed forward, hip width apart   Inhale lengthen tall Remember to blow as you pull,  Then relax shoulder on the return pull 15reps and then switch other foot forward _____

## 2017-01-12 NOTE — Therapy (Signed)
[place] MAIN [facility] SERVICES [address] [place], [place], [address] Phone: [phone]   Fax:  [phone]  Physical Therapy Treatment  Patient Details  Name: Ariel Phillips MRN: [id number] Date of Birth: 1932-12-27 Referring Provider: Dr. Silvano Rusk  Encounter Date: 01/12/2017      PT End of Session - 01/12/17 1041    Visit Number 5   Number of Visits 10   Date for PT Re-Evaluation 02/02/17   Authorization Type medicare with g-code   PT Start Time 1016   PT Stop Time 1100   PT Time Calculation (min) 44 min   Activity Tolerance Patient tolerated treatment well   Behavior During Therapy [facility] for tasks assessed/performed      Past Medical History:  Diagnosis Date  . Allergic asthma   . Allergic rhinitis due to pollen   . Allergy   . GERD (gastroesophageal reflux disease)   . IBS (irritable bowel syndrome)   . Osteopenia   . Unspecified hypothyroidism     Past Surgical History:  Procedure Laterality Date  . ANAL FISSURE REPAIR  1950's  . BASAL CELL CARCINOMA EXCISION  2000's   nose  . CHOLECYSTECTOMY    . COLONOSCOPY  Multiple   Negative screening exams  . [place] or so   with fissure repair  . TUBAL LIGATION      There were no vitals filed for this visit.      Subjective Assessment - 01/12/17 1017    Subjective Pt had less diarrhea episodes while on vacation. Pt found that the body scan technique was helpful to relax. Pt reports she has had better forming stools for the past fwe days which is something new to her. The stool is small and hard and she does not have to strain             [facility] PT Assessment - 01/12/17 1026      Observation/Other Assessments   Observations brighter affect      Borborgymus post relaxation technique ( mindfulness)   Upper trap overuse and postural sway and thoracic kyphosis   with band exercises . Required cuing and modification                  [facility] Adult PT Treatment/Exercise - 01/12/17 1027      Exercises   Exercises --  see pt instructions                PT Education - 01/12/17 1041    Education provided Yes   Education Details HEP   Person(s) Educated Patient   Methods Explanation   Comprehension Verbalized understanding;Returned demonstration;Verbal cues required;Need further instruction          PT Short Term Goals - 09/09/16 1008      PT SHORT TERM GOAL #1   Title independent with initial HEP   Time 4   Period Weeks   Status New     PT SHORT TERM GOAL #2   Title understand what foods could irritate her IBS   Time 4   Period Weeks   Status New     PT SHORT TERM GOAL #3   Title understand how to perform abdominal massage to reduce cramping   Time 4   Period Weeks   Status New     PT SHORT TERM GOAL #4   Title lower abdominal pain decreased >/= 25%   Time 4   Period Weeks  Status New     PT SHORT TERM GOAL #5   Title fecal incontinence decreased >/= 25% due to improve anal sphinter control   Time 4   Period Weeks   Status New           PT Long Term Goals - 12/29/16 2141      PT LONG TERM GOAL #1   Title Pt will decrease her PFDI score from 45 % to <40% in order to improve pelvic floor function   Time 12   Period Weeks   Status On-going     PT LONG TERM GOAL #2   Title Pt will decrease use of diapers from 12 / month to < 10 per month in order to improve hygiene, confidence with pelvic floor function   Time 12   Period Weeks   Status On-going     PT LONG TERM GOAL #3   Title Pt will demo decreased abdominal separation of 3 fingers to < 2 fingers in order to improve motility and progress to deep core strengthening exercises   Time 12   Status Achieved     PT LONG TERM GOAL #4   Title Pt will demo proper body mechanics ( log rolling, proper sitting) to minimize strain on her pelvic and abdominal mm and return to ADLs    Time 12   Period Weeks   Status Partially Met      PT LONG TERM GOAL #5   Title Pt will be cimpliant with relaxation training ( mindfulness technique) for one week to improve digestion    Time 12   Period Weeks   Status New   Target Date 12/29/16     PT LONG TERM GOAL #6   Title Pt will demo proper coordination of deep core mm and no lumbopelvic perturbation with deep core level 2-3 ( 5 reps) in order to achieve less slumped posture and increase intraabdominal pressure for motility and postural stability   Time 12   Period Weeks   Status New               Plan - 01/12/17 1042    Clinical Impression Statement Pt is making improvements with diarrhea and is has better forming stools. Pt's balance exercises that she performed with another PT were modified to ensure deep core co-activation and lower kinetic chain activation for increased postural stability.  Added more thoracic extension exercises and functional scapular stabilizing strengthening exericses to HEP to promote less forward and thoracic kyphotic posture.  Pt continues to benefit greatly from mindfulness practice as she reports feeling relaxed and experiencing borborgymus which indicates parasympathetic response for optimal GI function. Pt continues to benefit from skilled PT.   Rehab Potential Good   Clinical Impairments Affecting Rehab Potential IBS   PT Frequency 1x / week   PT Duration 12 weeks   PT Treatment/Interventions Electrical Stimulation;Therapeutic activities;Therapeutic exercise;Neuromuscular re-education;Patient/family education;Scar mobilization;Manual techniques;Taping;Functional mobility training;Stair training;Gait training;Moist Heat;Traction;Manual lymph drainage   Consulted and Agree with Plan of Care Patient      Patient will benefit from skilled therapeutic intervention in order to improve the following deficits and impairments:  Decreased activity tolerance, Decreased endurance, Decreased strength, Decreased scar mobility, Increased muscle spasms,  Pain, Decreased balance, Decreased range of motion, Improper body mechanics, Postural dysfunction  Visit Diagnosis: Other lack of coordination  Muscle weakness (generalized)  Other symptoms and signs involving the musculoskeletal system  Cramp and spasm     Problem List Patient Active Problem  List   Diagnosis Date Noted  . Mood disorder ([place]) 12/30/2016  . Memory loss 12/02/2015  . Benign essential tremor 07/20/2015  . Advanced directives, counseling/discussion 12/19/2013  . Routine general medical examination at a health care facility 12/13/2012  . Hypothyroidism 12/12/2011  . Allergic rhinitis due to pollen   . GERD (gastroesophageal reflux disease)   . IBS (irritable bowel syndrome)   . Osteopenia     Jerl Mina ,PT, DPT, E-RYT  01/12/2017, 12:31 PM  [place] MAIN [facility] SERVICES [address] [place], [place], [address] Phone: [phone]   Fax:  [phone]  Name: Ariel Phillips MRN: [id number] Date of Birth: 17-Jan-1933

## 2017-01-23 ENCOUNTER — Encounter: Payer: Self-pay | Admitting: Internal Medicine

## 2017-01-26 ENCOUNTER — Ambulatory Visit: Payer: Medicare Other | Admitting: Physical Therapy

## 2017-01-26 DIAGNOSIS — R29898 Other symptoms and signs involving the musculoskeletal system: Secondary | ICD-10-CM

## 2017-01-26 DIAGNOSIS — R252 Cramp and spasm: Secondary | ICD-10-CM | POA: Diagnosis not present

## 2017-01-26 DIAGNOSIS — M6281 Muscle weakness (generalized): Secondary | ICD-10-CM

## 2017-01-26 DIAGNOSIS — R278 Other lack of coordination: Secondary | ICD-10-CM

## 2017-01-26 NOTE — Therapy (Signed)
[place] MAIN [facility] SERVICES [address] [place], [place], [address] Phone: [phone]   Fax:  [phone]  Physical Therapy Treatment/ Discharge Summary   Patient Details  Name: Ariel Phillips MRN: [id number] Date of Birth: 1933-01-30 Referring Provider: Dr. Silvano Rusk  Encounter Date: 01/26/2017      PT End of Session - 01/26/17 1057    Visit Number 6   Number of Visits 10   Date for PT Re-Evaluation 02/02/17   Authorization Type medicare with g-code   PT Start Time 1010   PT Stop Time 1057   PT Time Calculation (min) 47 min   Activity Tolerance Patient tolerated treatment well   Behavior During Therapy [facility] for tasks assessed/performed      Past Medical History:  Diagnosis Date  . Allergic asthma   . Allergic rhinitis due to pollen   . Allergy   . GERD (gastroesophageal reflux disease)   . IBS (irritable bowel syndrome)   . Osteopenia   . Unspecified hypothyroidism     Past Surgical History:  Procedure Laterality Date  . ANAL FISSURE REPAIR  1950's  . BASAL CELL CARCINOMA EXCISION  2000's   nose  . CHOLECYSTECTOMY    . COLONOSCOPY  Multiple   Negative screening exams  . [place] or so   with fissure repair  . TUBAL LIGATION      There were no vitals filed for this visit.      Subjective Assessment - 01/26/17 1016    Subjective Pt enjoys her PT exercises but had a question about the open book exercise. Pt really likes the body scan technique.  Pt ' s diarrhea was better before going on vacation. Pt tried adding fiber into her diet and she thinks she may have added too much because she is having diarrhea again.  Pt would like to start the FODMAP diet and wanted to work with a nutritionist but her insurance does not pay. Pt tried chia instead of flax seeds and she is wanting to know how to get guidance with a nutritionist to follow the FODMAP           Assessment: lumbopelvic stability with  deep core level 3  . Brighter affect, more upright posture                  PT Education - 01/26/17 1025    Education provided Yes   Education Details  / d/c    Person(s) Educated Patient   Methods Explanation;Demonstration;Tactile cues;Verbal cues;Handout   Comprehension Returned demonstration;Verbalized understanding          PT Short Term Goals - 09/09/16 1008      PT SHORT TERM GOAL #1   Title independent with initial HEP   Time 4   Period Weeks   Status New     PT SHORT TERM GOAL #2   Title understand what foods could irritate her IBS   Time 4   Period Weeks   Status New     PT SHORT TERM GOAL #3   Title understand how to perform abdominal massage to reduce cramping   Time 4   Period Weeks   Status New     PT SHORT TERM GOAL #4   Title lower abdominal pain decreased >/= 25%   Time 4   Period Weeks   Status New     PT SHORT TERM GOAL #5   Title fecal incontinence decreased >/=  25% due to improve anal sphinter control   Time 4   Period Weeks   Status New           PT Long Term Goals - 01/26/17 1024      PT LONG TERM GOAL #1   Title Pt will decrease her PFDI score from 45 % to <40% in order to improve pelvic floor function (9/27: 39%)    Time 12   Period Weeks   Status Achieved     PT LONG TERM GOAL #2   Title Pt will decrease use of diapers from 12 / month to < 10 per month in order to improve hygiene, confidence with pelvic floor function   Time 12   Period Weeks   Status Achieved     PT LONG TERM GOAL #3   Title Pt will demo decreased abdominal separation of 3 fingers to < 2 fingers in order to improve motility and progress to deep core strengthening exercises   Time 12   Status Achieved     PT LONG TERM GOAL #4   Title Pt will demo proper body mechanics ( log rolling, proper sitting) to minimize strain on her pelvic and abdominal mm and return to ADLs    Time 12   Period Weeks   Status Achieved     PT LONG TERM GOAL #5    Title Pt will be compliant with relaxation training ( mindfulness technique) for one week to improve digestion    Time 12   Period Weeks   Status Achieved     PT LONG TERM GOAL #6   Title Pt will demo proper coordination of deep core mm and no lumbopelvic perturbation with deep core level 2-3 ( 5 reps) in order to achieve less slumped posture and increase intraabdominal pressure for motility and postural stability   Time 12   Period Weeks   Status Achieved               Plan - 01/26/17 1052    Clinical Impression Statement Pt has met 100%  of all her goals. Pt has improved "Quite a bit better" based on the GROC scale.  Pt's diarrhea and leakage have improved as indicated by her decreased PFDI score. Pt's intraabdominal pressure system has also improved with manual Tx as her diastasis recti has resolved and her posture is becoming more upright. Pt continues to maintain her deep core strengthening exercises.These musculoskeletal improvements will contribute to better motility. Relaxation practices and coordination training of pelvic floor also helped pt to not strain and bear down on her pelvic floor. Pt has been provided resources for nutritionists/dietition as pt wants help with following the FODMAP diet.  She thinks will help her make further changes because she has had a relapse with diarrhea when trying to increase fiber on her own and would like to learn the proper amounts. Pt is ready for d/c at this time.    Rehab Potential Good   Clinical Impairments Affecting Rehab Potential IBS   PT Frequency 1x / week   PT Duration 12 weeks   PT Treatment/Interventions Electrical Stimulation;Therapeutic activities;Therapeutic exercise;Neuromuscular re-education;Patient/family education;Scar mobilization;Manual techniques;Taping;Functional mobility training;Stair training;Gait training;Moist Heat;Traction;Manual lymph drainage   Consulted and Agree with Plan of Care Patient      Patient will  benefit from skilled therapeutic intervention in order to improve the following deficits and impairments:  Decreased activity tolerance, Decreased endurance, Decreased strength, Decreased scar mobility, Increased muscle spasms, Pain, Decreased balance, Decreased  range of motion, Improper body mechanics, Postural dysfunction  Visit Diagnosis: Muscle weakness (generalized)  Other lack of coordination  Cramp and spasm  Other symptoms and signs involving the musculoskeletal system       G-Codes - 02-25-17 1336    Functional Assessment Tool Used (Outpatient Only) PFDI clinical judgement   Functional Limitation Other PT subsequent;Self care   Self Care Current Status (P9733) At least 20 percent but less than 40 percent impaired, limited or restricted   Self Care Goal Status (J2508) At least 20 percent but less than 40 percent impaired, limited or restricted   Self Care Discharge Status [phone]) At least 20 percent but less than 40 percent impaired, limited or restricted   Other PT Primary Current Status (Z2904) At least 20 percent but less than 40 percent impaired, limited or restricted   Other PT Primary Goal Status (B5339) At least 20 percent but less than 40 percent impaired, limited or restricted   Other PT Secondary Current Status (P7921) At least 20 percent but less than 40 percent impaired, limited or restricted   Other PT Secondary Goal Status (B8375) At least 20 percent but less than 40 percent impaired, limited or restricted   Other PT Secondary Discharge Status (G2370) At least 20 percent but less than 40 percent impaired, limited or restricted      Problem List Patient Active Problem List   Diagnosis Date Noted  . Mood disorder ([place]) 12/30/2016  . Memory loss 12/02/2015  . Benign essential tremor 07/20/2015  . Advanced directives, counseling/discussion 12/19/2013  . Routine general medical examination at a health care facility 12/13/2012  . Hypothyroidism 12/12/2011  .  Allergic rhinitis due to pollen   . GERD (gastroesophageal reflux disease)   . IBS (irritable bowel syndrome)   . Osteopenia     Jerl Mina ,PT, DPT, E-RYT  02-25-2017, 1:59 PM  [place] MAIN [facility] SERVICES [address] [place], [place], [address] Phone: [phone]   Fax:  [phone]  Name: ALEJANDRA HUNT MRN: [id number] Date of Birth: [birth year]

## 2017-01-26 NOTE — Patient Instructions (Signed)
Nutritionists resource:  Education provided for nutritionist talk Oct 8 at [facility]   [email or web address]  [email or web address]    ________  Progress to Deep core level 3 ( marching)

## 2017-01-29 NOTE — Progress Notes (Signed)
Thank you so much.  Glad to know you are available at [facility] to help my patients that live in that area.  Glendell Docker

## 2017-01-30 DIAGNOSIS — H66009 Acute suppurative otitis media without spontaneous rupture of ear drum, unspecified ear: Secondary | ICD-10-CM | POA: Diagnosis not present

## 2017-01-30 DIAGNOSIS — H903 Sensorineural hearing loss, bilateral: Secondary | ICD-10-CM | POA: Diagnosis not present

## 2017-01-31 DIAGNOSIS — H43811 Vitreous degeneration, right eye: Secondary | ICD-10-CM | POA: Diagnosis not present

## 2017-02-03 DIAGNOSIS — R293 Abnormal posture: Secondary | ICD-10-CM | POA: Diagnosis not present

## 2017-02-03 DIAGNOSIS — R42 Dizziness and giddiness: Secondary | ICD-10-CM | POA: Diagnosis not present

## 2017-02-03 DIAGNOSIS — H8111 Benign paroxysmal vertigo, right ear: Secondary | ICD-10-CM | POA: Diagnosis not present

## 2017-02-03 DIAGNOSIS — M6281 Muscle weakness (generalized): Secondary | ICD-10-CM | POA: Diagnosis not present

## 2017-02-03 DIAGNOSIS — R2681 Unsteadiness on feet: Secondary | ICD-10-CM | POA: Diagnosis not present

## 2017-02-06 DIAGNOSIS — J301 Allergic rhinitis due to pollen: Secondary | ICD-10-CM | POA: Diagnosis not present

## 2017-02-10 DIAGNOSIS — R293 Abnormal posture: Secondary | ICD-10-CM | POA: Diagnosis not present

## 2017-02-10 DIAGNOSIS — R2681 Unsteadiness on feet: Secondary | ICD-10-CM | POA: Diagnosis not present

## 2017-02-10 DIAGNOSIS — H8111 Benign paroxysmal vertigo, right ear: Secondary | ICD-10-CM | POA: Diagnosis not present

## 2017-02-10 DIAGNOSIS — R42 Dizziness and giddiness: Secondary | ICD-10-CM | POA: Diagnosis not present

## 2017-02-10 DIAGNOSIS — M6281 Muscle weakness (generalized): Secondary | ICD-10-CM | POA: Diagnosis not present

## 2017-02-17 DIAGNOSIS — R42 Dizziness and giddiness: Secondary | ICD-10-CM | POA: Diagnosis not present

## 2017-02-17 DIAGNOSIS — M6281 Muscle weakness (generalized): Secondary | ICD-10-CM | POA: Diagnosis not present

## 2017-02-17 DIAGNOSIS — R2681 Unsteadiness on feet: Secondary | ICD-10-CM | POA: Diagnosis not present

## 2017-02-17 DIAGNOSIS — R293 Abnormal posture: Secondary | ICD-10-CM | POA: Diagnosis not present

## 2017-02-17 DIAGNOSIS — H8111 Benign paroxysmal vertigo, right ear: Secondary | ICD-10-CM | POA: Diagnosis not present

## 2017-02-20 ENCOUNTER — Ambulatory Visit: Payer: Medicare Other | Admitting: Physical Therapy

## 2017-02-20 DIAGNOSIS — J301 Allergic rhinitis due to pollen: Secondary | ICD-10-CM | POA: Diagnosis not present

## 2017-02-24 DIAGNOSIS — J301 Allergic rhinitis due to pollen: Secondary | ICD-10-CM | POA: Diagnosis not present

## 2017-03-03 DIAGNOSIS — R293 Abnormal posture: Secondary | ICD-10-CM | POA: Diagnosis not present

## 2017-03-03 DIAGNOSIS — H8111 Benign paroxysmal vertigo, right ear: Secondary | ICD-10-CM | POA: Diagnosis not present

## 2017-03-03 DIAGNOSIS — R42 Dizziness and giddiness: Secondary | ICD-10-CM | POA: Diagnosis not present

## 2017-03-03 DIAGNOSIS — R2681 Unsteadiness on feet: Secondary | ICD-10-CM | POA: Diagnosis not present

## 2017-03-06 ENCOUNTER — Ambulatory Visit: Payer: Self-pay | Admitting: Physical Therapy

## 2017-03-06 DIAGNOSIS — G43109 Migraine with aura, not intractable, without status migrainosus: Secondary | ICD-10-CM | POA: Diagnosis not present

## 2017-03-13 DIAGNOSIS — J301 Allergic rhinitis due to pollen: Secondary | ICD-10-CM | POA: Diagnosis not present

## 2017-03-20 ENCOUNTER — Encounter: Payer: Self-pay | Admitting: Physical Therapy

## 2017-03-27 DIAGNOSIS — J301 Allergic rhinitis due to pollen: Secondary | ICD-10-CM | POA: Diagnosis not present

## 2017-04-13 DIAGNOSIS — J301 Allergic rhinitis due to pollen: Secondary | ICD-10-CM | POA: Diagnosis not present

## 2017-04-20 DIAGNOSIS — L821 Other seborrheic keratosis: Secondary | ICD-10-CM | POA: Diagnosis not present

## 2017-04-20 DIAGNOSIS — Z08 Encounter for follow-up examination after completed treatment for malignant neoplasm: Secondary | ICD-10-CM | POA: Diagnosis not present

## 2017-04-20 DIAGNOSIS — D225 Melanocytic nevi of trunk: Secondary | ICD-10-CM | POA: Diagnosis not present

## 2017-04-20 DIAGNOSIS — Z85828 Personal history of other malignant neoplasm of skin: Secondary | ICD-10-CM | POA: Diagnosis not present

## 2017-05-08 DIAGNOSIS — J301 Allergic rhinitis due to pollen: Secondary | ICD-10-CM | POA: Diagnosis not present

## 2017-05-19 DIAGNOSIS — J301 Allergic rhinitis due to pollen: Secondary | ICD-10-CM | POA: Diagnosis not present

## 2017-05-22 DIAGNOSIS — J301 Allergic rhinitis due to pollen: Secondary | ICD-10-CM | POA: Diagnosis not present

## 2017-06-05 DIAGNOSIS — J301 Allergic rhinitis due to pollen: Secondary | ICD-10-CM | POA: Diagnosis not present

## 2017-06-14 DIAGNOSIS — H43811 Vitreous degeneration, right eye: Secondary | ICD-10-CM | POA: Diagnosis not present

## 2017-07-03 DIAGNOSIS — J301 Allergic rhinitis due to pollen: Secondary | ICD-10-CM | POA: Diagnosis not present

## 2017-07-10 DIAGNOSIS — J301 Allergic rhinitis due to pollen: Secondary | ICD-10-CM | POA: Diagnosis not present

## 2017-07-12 ENCOUNTER — Other Ambulatory Visit: Payer: Self-pay | Admitting: Internal Medicine

## 2017-07-13 DIAGNOSIS — J301 Allergic rhinitis due to pollen: Secondary | ICD-10-CM | POA: Diagnosis not present

## 2017-07-24 DIAGNOSIS — J301 Allergic rhinitis due to pollen: Secondary | ICD-10-CM | POA: Diagnosis not present

## 2017-08-07 DIAGNOSIS — J301 Allergic rhinitis due to pollen: Secondary | ICD-10-CM | POA: Diagnosis not present

## 2017-08-11 DIAGNOSIS — J301 Allergic rhinitis due to pollen: Secondary | ICD-10-CM | POA: Diagnosis not present

## 2017-08-21 DIAGNOSIS — J301 Allergic rhinitis due to pollen: Secondary | ICD-10-CM | POA: Diagnosis not present

## 2017-08-22 ENCOUNTER — Ambulatory Visit (INDEPENDENT_AMBULATORY_CARE_PROVIDER_SITE_OTHER): Payer: Medicare Other | Admitting: Family Medicine

## 2017-08-22 ENCOUNTER — Encounter: Payer: Self-pay | Admitting: Family Medicine

## 2017-08-22 DIAGNOSIS — J309 Allergic rhinitis, unspecified: Secondary | ICD-10-CM | POA: Insufficient documentation

## 2017-08-22 MED ORDER — ALBUTEROL SULFATE HFA 108 (90 BASE) MCG/ACT IN AERS: INHALATION_SPRAY | RESPIRATORY_TRACT | 1 refills | Status: DC

## 2017-08-22 NOTE — Assessment & Plan Note (Signed)
Likely cause of  ST.Marland Kitchen No clear bacterial infection.  Possible viral URI.  Change to allegra and start nasal saline.

## 2017-08-22 NOTE — Progress Notes (Signed)
   Subjective:    Patient ID: Ariel Phillips, female    DOB: October 06, 1932, 84 y.o.   MRN: [id number]  Cough  This is a new problem. The current episode started yesterday. The problem has been gradually worsening. The cough is non-productive. Associated symptoms include nasal congestion and a sore throat. Pertinent negatives include no chills, ear congestion, ear pain, fever, myalgias, shortness of breath or wheezing. The symptoms are aggravated by pollens and lying down. Risk factors: nonsmpoker. Her past medical history is significant for asthma and environmental allergies. There is no history of bronchitis, COPD or pneumonia.  Sore Throat   Associated symptoms include coughing. Pertinent negatives include no ear pain or shortness of breath.     She is getting allergy shots.. Got one yesterday.  Followed by [facility].   Has tried advil, nasal spray, antihistamine.   Review of Systems  Constitutional: Negative for chills and fever.  HENT: Positive for sore throat. Negative for ear pain.   Respiratory: Positive for cough. Negative for shortness of breath and wheezing.   Musculoskeletal: Negative for myalgias.  Allergic/Immunologic: Positive for environmental allergies.       Objective:   Physical Exam  Constitutional: Vital signs are normal. She appears well-developed and well-nourished. She is cooperative.  Non-toxic appearance. She does not appear ill. No distress.  HENT:  Head: Normocephalic.  Right Ear: Hearing, tympanic membrane, external ear and ear canal normal. Tympanic membrane is not erythematous, not retracted and not bulging.  Left Ear: Hearing, tympanic membrane, external ear and ear canal normal. Tympanic membrane is not erythematous, not retracted and not bulging.  Nose: Mucosal edema and rhinorrhea present. Right sinus exhibits no maxillary sinus tenderness and no frontal sinus tenderness. Left sinus exhibits no maxillary sinus tenderness and no frontal sinus  tenderness.  Mouth/Throat: Uvula is midline, oropharynx is clear and moist and mucous membranes are normal.  Eyes: Pupils are equal, round, and reactive to light. Conjunctivae, EOM and lids are normal. Lids are everted and swept, no foreign bodies found.  Neck: Trachea normal and normal range of motion. Neck supple. Carotid bruit is not present. No thyroid mass and no thyromegaly present.  Cardiovascular: Normal rate, regular rhythm, S1 normal, S2 normal, normal heart sounds, intact distal pulses and normal pulses. Exam reveals no gallop and no friction rub.  No murmur heard. Pulmonary/Chest: Effort normal and breath sounds normal. No tachypnea. No respiratory distress. She has no decreased breath sounds. She has no wheezes. She has no rhonchi. She has no rales.  Neurological: She is alert.  Skin: Skin is warm, dry and intact. No rash noted.  Psychiatric: Her speech is normal and behavior is normal. Judgment normal. Her mood appears not anxious. Cognition and memory are normal. She does not exhibit a depressed mood.          Assessment & Plan:

## 2017-08-22 NOTE — Patient Instructions (Signed)
Change to allegra.  Consider nasal saline rinse or spray.  Call if any shortness of breath or fever late illness.

## 2017-08-30 ENCOUNTER — Ambulatory Visit (INDEPENDENT_AMBULATORY_CARE_PROVIDER_SITE_OTHER): Payer: Medicare Other | Admitting: Internal Medicine

## 2017-08-30 ENCOUNTER — Encounter: Payer: Self-pay | Admitting: Internal Medicine

## 2017-08-30 VITALS — BP 118/64 | HR 47 | Temp 98.0°F | Ht 63.0 in | Wt 132.0 lb

## 2017-08-30 DIAGNOSIS — J4521 Mild intermittent asthma with (acute) exacerbation: Secondary | ICD-10-CM | POA: Diagnosis not present

## 2017-08-30 MED ORDER — PREDNISONE 20 MG PO TABS: 40.0000 mg | ORAL_TABLET | Freq: Every day | ORAL | 0 refills | Status: DC

## 2017-08-30 NOTE — Assessment & Plan Note (Signed)
Still seems to have just a viral infection--on top of pollen exposure Will give prednisone course Urged her to use the albuterol prn for now If ongoing symptoms, consider singulair If sinus symptoms or productive cough, consider empiric antibiotic

## 2017-08-30 NOTE — Progress Notes (Signed)
Subjective:    Patient ID: Ariel Phillips, female    DOB: 10-13-1932, 84 y.o.   MRN: [id number]  HPI Here due to ongoing respiratory symptoms Started about 9 days ago with sore throat (didn't sound like typical cold type sore throat) Hadn't had fever at first--but then "felt absolutely awful) Sore throat, cough, malaise, wheezing (first time in years) Didn't try the albuterol--didn't feel it was that bad  No chills or sweats Some SOB--- but due to upper chest congestion No sinus pressure No ear pain Still with loose cough  Has tried nasal irrigation guaifenisin helps some  Current Outpatient Medications on File Prior to Visit  Medication Sig Dispense Refill  . albuterol (PROAIR HFA) 108 (90 Base) MCG/ACT inhaler INHALE 2 PUFFS INTO THE LUNGS 3 (THREE) TIMES DAILY AS NEEDED. 8.5 each 1  . Azelastine HCl 137 MCG/SPRAY SOLN USE 1 SPRAY IN BOTH NOSTRILS TWICE A DAY 30 mL 11  . carboxymethylcellulose (REFRESH PLUS) 0.5 % SOLN 2 drops daily as needed.    Marland Kitchen EPINEPHrine 0.3 mg/0.3 mL IJ SOAJ injection Inject 0.3 mg into the muscle once. Use prn, takes allergy shots and has this on hand for adverse reactions    . levothyroxine (SYNTHROID, LEVOTHROID) 75 MCG tablet Take 1 tablet (75 mcg total) by mouth daily before breakfast. 90 tablet 3  . loratadine (CLARITIN) 10 MG tablet Take 10 mg by mouth daily.    Marland Kitchen PRESCRIPTION MEDICATION      No current facility-administered medications on file prior to visit.     Allergies  Allergen Reactions  . Penicillins Diarrhea  . Latex Rash    Questionable allergy per patient, rash developed at site of latex band-aid placed after toenail removal    Past Medical History:  Diagnosis Date  . Allergic asthma   . Allergic rhinitis due to pollen   . Allergy   . GERD (gastroesophageal reflux disease)   . IBS (irritable bowel syndrome)   . Osteopenia   . Unspecified hypothyroidism     Past Surgical History:  Procedure Laterality Date  . ANAL  FISSURE REPAIR  1950's  . BASAL CELL CARCINOMA EXCISION  2000's   nose  . CHOLECYSTECTOMY    . COLONOSCOPY  Multiple   Negative screening exams  . [place] or so   with fissure repair  . TUBAL LIGATION      Family History  Problem Relation Age of Onset  . COPD Mother   . Cancer Mother        unclear diagnosis  . Transient ischemic attack Mother   . Diabetes Sister   . Hypertension Sister   . Stroke Other   . Cancer Paternal Grandmother        ?breast  . Heart disease Neg Hx     Social History   Socioeconomic History  . Marital status: Divorced    Spouse name: Not on file  . Number of children: 1  . Years of education: Not on file  . Highest education level: Not on file  Occupational History  . Occupation: Retired Personnel officer  Social Needs  . Financial resource strain: Not on file  . Food insecurity:    Worry: Not on file    Inability: Not on file  . Transportation needs:    Medical: Not on file    Non-medical: Not on file  Tobacco Use  . Smoking status: Never Smoker  . Smokeless tobacco: Never Used  Substance and Sexual Activity  .  Alcohol use: Yes    Comment: rare wine  . Drug use: No  . Sexual activity: Not on file  Lifestyle  . Physical activity:    Days per week: Not on file    Minutes per session: Not on file  . Stress: Not on file  Relationships  . Social connections:    Talks on phone: Not on file    Gets together: Not on file    Attends religious service: Not on file    Active member of club or organization: Not on file    Attends meetings of clubs or organizations: Not on file    Relationship status: Not on file  . Intimate partner violence:    Fear of current or ex partner: Not on file    Emotionally abused: Not on file    Physically abused: Not on file    Forced sexual activity: Not on file  Other Topics Concern  . Not on file  Social History Narrative   Divorced and retired , moved from [place], [place]   1 Daughter in [place]   Has living will   Daughter has health care POA.---[name]   Requests DNR--order done 06/19/12   Would not want prolonged mechanical ventilation   May accept tube feeds temporarily but not if cognitively unaware   Review of Systems  No rash No vomiting or diarrhea Appetite is okay     Objective:   Physical Exam  Constitutional: She appears well-developed. No distress.  HENT:  Mouth/Throat: Oropharynx is clear and moist. No oropharyngeal exudate.  Mild nasal inflammation  Neck: No thyromegaly present.  Pulmonary/Chest: Effort normal. No respiratory distress.  Mild decreased breath sounds, moderate expiratory prolongation and mild exp wheeze/rhonchi  Lymphadenopathy:    She has no cervical adenopathy.          Assessment & Plan:

## 2017-09-04 ENCOUNTER — Encounter: Payer: Self-pay | Admitting: Internal Medicine

## 2017-09-04 DIAGNOSIS — D485 Neoplasm of uncertain behavior of skin: Secondary | ICD-10-CM | POA: Diagnosis not present

## 2017-09-04 DIAGNOSIS — C44311 Basal cell carcinoma of skin of nose: Secondary | ICD-10-CM | POA: Diagnosis not present

## 2017-09-06 ENCOUNTER — Encounter: Payer: Self-pay | Admitting: Family Medicine

## 2017-09-06 ENCOUNTER — Ambulatory Visit (INDEPENDENT_AMBULATORY_CARE_PROVIDER_SITE_OTHER): Payer: Medicare Other | Admitting: Family Medicine

## 2017-09-06 VITALS — BP 110/54 | HR 103 | Temp 97.9°F | Ht 63.0 in | Wt 134.0 lb

## 2017-09-06 DIAGNOSIS — R059 Cough, unspecified: Secondary | ICD-10-CM

## 2017-09-06 DIAGNOSIS — R05 Cough: Secondary | ICD-10-CM

## 2017-09-06 MED ORDER — BENZONATATE 100 MG PO CAPS: 100.0000 mg | ORAL_CAPSULE | Freq: Three times a day (TID) | ORAL | 0 refills | Status: DC | PRN

## 2017-09-06 NOTE — Patient Instructions (Signed)
Use your inhaler every 4-6 hours as needed for cough  Use Tessalon Perles as prescribed  Notify office if not better in 4-5 days or if worsening

## 2017-09-06 NOTE — Progress Notes (Signed)
Subjective:    Patient ID: Ariel Phillips, female    DOB: 07/19/1932, 84 y.o.   MRN: [id number]  HPI This is an 84 yo female who presents today with continued cough. Started on 08/21/17 with sore throat. Progressed to runny nose and mild wheeze and was seen 1 week ago by her PCP, Dr. Silvio Pate gave her a prednisone taper and she was instructed to use albuterol which she did. Feels better in the morning, cough gets worse as day progresses and she is coughing at night. Has been taking Mucinex and cough drops. Cough is looser today. No fever, headache or ear pain. Runny nose, clear. Using Claritin and Azelastine nasal spray. Has not tried albuterol inhaler for cough, has not used since wheezing resolved.  Feels much better today.   Past Medical History:  Diagnosis Date  . Allergic asthma   . Allergic rhinitis due to pollen   . Allergy   . GERD (gastroesophageal reflux disease)   . IBS (irritable bowel syndrome)   . Osteopenia   . Unspecified hypothyroidism    Past Surgical History:  Procedure Laterality Date  . ANAL FISSURE REPAIR  1950's  . BASAL CELL CARCINOMA EXCISION  2000's   nose  . CHOLECYSTECTOMY    . COLONOSCOPY  Multiple   Negative screening exams  . [place] or so   with fissure repair  . TUBAL LIGATION     Family History  Problem Relation Age of Onset  . COPD Mother   . Cancer Mother        unclear diagnosis  . Transient ischemic attack Mother   . Diabetes Sister   . Hypertension Sister   . Stroke Other   . Cancer Paternal Grandmother        ?breast  . Heart disease Neg Hx    Social History   Tobacco Use  . Smoking status: Never Smoker  . Smokeless tobacco: Never Used  Substance Use Topics  . Alcohol use: Yes    Comment: rare wine  . Drug use: No        Review of Systems Per HPI    Objective:   Physical Exam  Constitutional: She appears well-developed and well-nourished.  HENT:  Head: Normocephalic and atraumatic.  Nose: Nose  normal.  Mouth/Throat: Oropharynx is clear and moist.  Eyes: Conjunctivae are normal.  Neck: Normal range of motion. Neck supple.  Cardiovascular: Normal rate, regular rhythm and normal heart sounds.  Pulmonary/Chest: Effort normal and breath sounds normal. No stridor. No respiratory distress. She has no wheezes. She has no rales.  Frequent dry cough which decreased following 2 puffs of her albuterol MDI.   Lymphadenopathy:    She has no cervical adenopathy.  Neurological: She is alert.  Skin: Skin is warm and dry. She is not diaphoretic.  Psychiatric: She has a normal mood and affect. Her behavior is normal. Judgment and thought content normal.  Vitals reviewed.        BP (!) 110/54 (BP Location: Right Arm, Patient Position: Sitting, Cuff Size: Normal)   Pulse (!) 103   Temp 97.9 F (36.6 C) (Oral)   Ht 5\' 3"  (1.6 m)   Wt 134 lb (60.8 kg)   SpO2 97%   BMI 23.74 kg/m  Wt Readings from Last 3 Encounters:  09/06/17 134 lb (60.8 kg)  08/30/17 132 lb (59.9 kg)  08/22/17 135 lb (61.2 kg)   BP Readings from Last 3 Encounters:  09/06/17 (!) 110/54  08/30/17 118/64  08/22/17 122/64    Assessment & Plan:  1. Cough - suspect reactive related to recent allergies/? Viral illness, feeling better today, lungs clear - continue cough drops, good fluid intake -  Patient Instructions  Use your inhaler every 4-6 hours as needed for cough  Use Tessalon Perles as prescribed  Notify office if not better in 4-5 days or if worsening   - benzonatate (TESSALON) 100 MG capsule; Take 1-2 capsules (100-200 mg total) by mouth 3 (three) times daily as needed.  Dispense: 30 capsule; Refill: 0   Clarene Reamer, FNP-BC  [facility] at [facility]  09/06/2017 2:17 PM

## 2017-09-06 NOTE — Progress Notes (Signed)
[id number]

## 2017-09-09 ENCOUNTER — Inpatient Hospital Stay (HOSPITAL_COMMUNITY)
Admission: EM | Admit: 2017-09-09 | Discharge: 2017-09-11 | DRG: 871 | Disposition: A | Discharge status: Home / Self Care | Payer: Medicare Other | Attending: Internal Medicine | Admitting: Internal Medicine

## 2017-09-09 ENCOUNTER — Ambulatory Visit (INDEPENDENT_AMBULATORY_CARE_PROVIDER_SITE_OTHER): Payer: Medicare Other | Admitting: Family Medicine

## 2017-09-09 ENCOUNTER — Emergency Department (HOSPITAL_COMMUNITY): Payer: Medicare Other

## 2017-09-09 ENCOUNTER — Other Ambulatory Visit: Payer: Self-pay

## 2017-09-09 VITALS — BP 78/50 | HR 139 | Temp 97.9°F | Resp 16 | Wt 134.0 lb

## 2017-09-09 DIAGNOSIS — R079 Chest pain, unspecified: Secondary | ICD-10-CM | POA: Diagnosis not present

## 2017-09-09 DIAGNOSIS — R42 Dizziness and giddiness: Secondary | ICD-10-CM | POA: Diagnosis not present

## 2017-09-09 DIAGNOSIS — A419 Sepsis, unspecified organism: Secondary | ICD-10-CM | POA: Diagnosis not present

## 2017-09-09 DIAGNOSIS — Z79899 Other long term (current) drug therapy: Secondary | ICD-10-CM

## 2017-09-09 DIAGNOSIS — E039 Hypothyroidism, unspecified: Secondary | ICD-10-CM | POA: Diagnosis not present

## 2017-09-09 DIAGNOSIS — R000 Tachycardia, unspecified: Secondary | ICD-10-CM

## 2017-09-09 DIAGNOSIS — R0602 Shortness of breath: Secondary | ICD-10-CM | POA: Diagnosis not present

## 2017-09-09 DIAGNOSIS — J181 Lobar pneumonia, unspecified organism: Secondary | ICD-10-CM | POA: Diagnosis not present

## 2017-09-09 DIAGNOSIS — Z66 Do not resuscitate: Secondary | ICD-10-CM | POA: Diagnosis present

## 2017-09-09 DIAGNOSIS — Z79890 Hormone replacement therapy (postmenopausal): Secondary | ICD-10-CM | POA: Diagnosis not present

## 2017-09-09 DIAGNOSIS — J45909 Unspecified asthma, uncomplicated: Secondary | ICD-10-CM | POA: Diagnosis present

## 2017-09-09 DIAGNOSIS — R05 Cough: Secondary | ICD-10-CM | POA: Diagnosis not present

## 2017-09-09 DIAGNOSIS — K219 Gastro-esophageal reflux disease without esophagitis: Secondary | ICD-10-CM | POA: Diagnosis present

## 2017-09-09 DIAGNOSIS — I959 Hypotension, unspecified: Secondary | ICD-10-CM

## 2017-09-09 DIAGNOSIS — J189 Pneumonia, unspecified organism: Secondary | ICD-10-CM | POA: Diagnosis not present

## 2017-09-09 DIAGNOSIS — Z880 Allergy status to penicillin: Secondary | ICD-10-CM | POA: Diagnosis not present

## 2017-09-09 DIAGNOSIS — Z91040 Latex allergy status: Secondary | ICD-10-CM

## 2017-09-09 LAB — URINALYSIS, ROUTINE W REFLEX MICROSCOPIC
BACTERIA UA: NONE SEEN
BILIRUBIN URINE: NEGATIVE
GLUCOSE, UA: NEGATIVE mg/dL
KETONES UR: NEGATIVE mg/dL
Leukocytes, UA: NEGATIVE
Nitrite: NEGATIVE
PROTEIN: 100 mg/dL — AB
Specific Gravity, Urine: 1.015 (ref 1.005–1.030)
pH: 5 (ref 5.0–8.0)

## 2017-09-09 LAB — I-STAT CG4 LACTIC ACID, ED
LACTIC ACID, VENOUS: 1.71 mmol/L (ref 0.5–1.9)
LACTIC ACID, VENOUS: 1.22 mmol/L (ref 0.5–1.9)

## 2017-09-09 LAB — COMPREHENSIVE METABOLIC PANEL
ALT: 12 U/L — ABNORMAL LOW (ref 14–54)
ANION GAP: 9 (ref 5–15)
AST: 23 U/L (ref 15–41)
Albumin: 2.9 g/dL — ABNORMAL LOW (ref 3.5–5.0)
Alkaline Phosphatase: 59 U/L (ref 38–126)
BILIRUBIN TOTAL: 1 mg/dL (ref 0.3–1.2)
BUN: 15 mg/dL (ref 6–20)
CHLORIDE: 107 mmol/L (ref 101–111)
CO2: 23 mmol/L (ref 22–32)
Calcium: 8.4 mg/dL — ABNORMAL LOW (ref 8.9–10.3)
Creatinine, Ser: 0.81 mg/dL (ref 0.44–1.00)
GFR calc Af Amer: >60 mL/min (ref >60)
GFR calc non Af Amer: >60 mL/min (ref >60)
GLUCOSE: 93 mg/dL (ref 65–99)
POTASSIUM: 4.9 mmol/L (ref 3.5–5.1)
Sodium: 139 mmol/L (ref 135–145)
Total Protein: 6.2 g/dL — ABNORMAL LOW (ref 6.5–8.1)

## 2017-09-09 LAB — CBC WITH DIFFERENTIAL/PLATELET
BASOS ABS: 0 10*3/uL (ref 0.0–0.1)
BASOS PCT: 0 %
Eosinophils Absolute: 0.1 10*3/uL (ref 0.0–0.7)
Eosinophils Relative: 1 %
HEMATOCRIT: 38.8 % (ref 36.0–46.0)
Hemoglobin: 12.6 g/dL (ref 12.0–15.0)
Lymphocytes Relative: 16 %
Lymphs Abs: 1.1 10*3/uL (ref 0.7–4.0)
MCH: 31.2 pg (ref 26.0–34.0)
MCHC: 32.5 g/dL (ref 30.0–36.0)
MCV: 96 fL (ref 78.0–100.0)
MONO ABS: 0.8 10*3/uL (ref 0.1–1.0)
Monocytes Relative: 12 %
NEUTROS ABS: 5 10*3/uL (ref 1.7–7.7)
NEUTROS PCT: 71 %
Platelets: 313 10*3/uL (ref 150–400)
RBC: 4.04 MIL/uL (ref 3.87–5.11)
RDW: 12.8 % (ref 11.5–15.5)
WBC: 7 10*3/uL (ref 4.0–10.5)

## 2017-09-09 LAB — LACTIC ACID, PLASMA
Lactic Acid, Venous: 1.7 mmol/L (ref 0.5–1.9)
Lactic Acid, Venous: 1.3 mmol/L (ref 0.5–1.9)

## 2017-09-09 MED ORDER — BENZONATATE 100 MG PO CAPS
100.0000 mg | ORAL_CAPSULE | Freq: Three times a day (TID) | ORAL | Status: DC | PRN
  Administered 2017-09-10 (×4): 100 mg via ORAL
  Filled 2017-09-09: qty 2
  Filled 2017-09-09 (×3): qty 1

## 2017-09-09 MED ORDER — EPINEPHRINE 0.3 MG/0.3ML IJ SOAJ: 0.3000 mg | Freq: Once | INTRAMUSCULAR | Status: DC

## 2017-09-09 MED ORDER — SODIUM CHLORIDE 0.9 % IV SOLN
1.0000 g | Freq: Once | INTRAVENOUS | Status: AC
  Administered 2017-09-09: 1 g via INTRAVENOUS
  Filled 2017-09-09: qty 10

## 2017-09-09 MED ORDER — HEPARIN SODIUM (PORCINE) 5000 UNIT/ML IJ SOLN
5000.0000 [IU] | Freq: Three times a day (TID) | INTRAMUSCULAR | Status: DC
  Administered 2017-09-09 – 2017-09-11 (×5): 5000 [IU] via SUBCUTANEOUS
  Filled 2017-09-09 (×5): qty 1

## 2017-09-09 MED ORDER — POLYVINYL ALCOHOL 1.4 % OP SOLN: 1.0000 [drp] | OPHTHALMIC | Status: DC | PRN

## 2017-09-09 MED ORDER — ALBUTEROL SULFATE (2.5 MG/3ML) 0.083% IN NEBU: 2.5000 mg | INHALATION_SOLUTION | Freq: Four times a day (QID) | RESPIRATORY_TRACT | Status: DC | PRN

## 2017-09-09 MED ORDER — AZITHROMYCIN 500 MG IV SOLR
500.0000 mg | INTRAVENOUS | Status: DC
  Administered 2017-09-10: 500 mg via INTRAVENOUS
  Filled 2017-09-09 (×2): qty 500

## 2017-09-09 MED ORDER — LEVOTHYROXINE SODIUM 50 MCG PO TABS
75.0000 ug | ORAL_TABLET | Freq: Every day | ORAL | Status: DC
  Administered 2017-09-10 – 2017-09-11 (×2): 75 ug via ORAL
  Filled 2017-09-09 (×2): qty 1

## 2017-09-09 MED ORDER — SODIUM CHLORIDE 0.9 % IV SOLN
1.0000 g | INTRAVENOUS | Status: DC
  Administered 2017-09-10: 1 g via INTRAVENOUS
  Filled 2017-09-09: qty 1

## 2017-09-09 MED ORDER — LACTATED RINGERS IV BOLUS
1000.0000 mL | Freq: Once | INTRAVENOUS | Status: AC
  Administered 2017-09-09: 1000 mL via INTRAVENOUS

## 2017-09-09 MED ORDER — LACTATED RINGERS IV BOLUS
1000.0000 mL | Freq: Once | INTRAVENOUS | Status: AC
Start: 2017-09-09 — End: 2017-09-09
  Administered 2017-09-09: 1000 mL via INTRAVENOUS

## 2017-09-09 MED ORDER — EPINEPHRINE 0.3 MG/0.3ML IJ SOAJ: 0.3000 mg | INTRAMUSCULAR | Status: DC | PRN

## 2017-09-09 MED ORDER — SODIUM CHLORIDE 0.9 % IV SOLN
500.0000 mg | Freq: Once | INTRAVENOUS | Status: AC
  Administered 2017-09-09: 500 mg via INTRAVENOUS
  Filled 2017-09-09: qty 500

## 2017-09-09 MED ORDER — SODIUM CHLORIDE 0.9 % IV SOLN
INTRAVENOUS | Status: DC
  Administered 2017-09-09 – 2017-09-11 (×3): via INTRAVENOUS

## 2017-09-09 MED ORDER — CARBOXYMETHYLCELLULOSE SODIUM 0.5 % OP SOLN: 2.0000 [drp] | Freq: Every day | OPHTHALMIC | Status: DC | PRN

## 2017-09-09 NOTE — ED Provider Notes (Signed)
[facility] DEPT Provider Note   CSN: [id number] Arrival date & time: 09/09/17  1401     History   Chief Complaint Chief Complaint  Patient presents with  . Hypotension  . Tachycardia    HPI Ariel Phillips is a 84 y.o. female.  Cough for multiple weeks being managed by her primary doctor then wanted today for follow-up and found to have a blood pressure of 78/50 with a heart rate of 139 sent here for further evaluation.  Patient feels a bit dizzy when she stands up quickly but no other complaints.   Cough  This is a new problem. The current episode started more than 1 week ago. The problem occurs constantly. The problem has been gradually worsening. The cough is productive of sputum. There has been no fever. Associated symptoms include chest pain and shortness of breath. She has tried decongestants for the symptoms. The treatment provided no relief. Risk factors include travel to endemic areas. She is not a smoker.    Past Medical History:  Diagnosis Date  . Allergic asthma   . Allergic rhinitis due to pollen   . Allergy   . GERD (gastroesophageal reflux disease)   . IBS (irritable bowel syndrome)   . Osteopenia   . Unspecified hypothyroidism     Patient Active Problem List   Diagnosis Date Noted  . Allergic rhinitis 08/22/2017  . Mood disorder ([place]) 12/30/2016  . Memory loss 12/02/2015  . Benign essential tremor 07/20/2015  . Advanced directives, counseling/discussion 12/19/2013  . Routine general medical examination at a health care facility 12/13/2012  . Hypothyroidism 12/12/2011  . Allergic rhinitis due to pollen   . Allergic asthma   . GERD (gastroesophageal reflux disease)   . IBS (irritable bowel syndrome)   . Osteopenia     Past Surgical History:  Procedure Laterality Date  . ANAL FISSURE REPAIR  1950's  . BASAL CELL CARCINOMA EXCISION  2000's   nose  . CHOLECYSTECTOMY    . COLONOSCOPY  Multiple   Negative screening  exams  . [place] or so   with fissure repair  . TUBAL LIGATION       OB History   None      Home Medications    Prior to Admission medications   Medication Sig Start Date End Date Taking? Authorizing Provider  albuterol (PROAIR HFA) 108 (90 Base) MCG/ACT inhaler INHALE 2 PUFFS INTO THE LUNGS 3 (THREE) TIMES DAILY AS NEEDED. 08/22/17   Bedsole, Amy E, MD  Azelastine HCl 137 MCG/SPRAY SOLN USE 1 SPRAY IN BOTH NOSTRILS TWICE A DAY 07/12/17   Venia Carbon, MD  benzonatate (TESSALON) 100 MG capsule Take 1-2 capsules (100-200 mg total) by mouth 3 (three) times daily as needed. 09/06/17   Elby Beck, FNP  carboxymethylcellulose (REFRESH PLUS) 0.5 % SOLN 2 drops daily as needed.    [provider]  EPINEPHrine 0.3 mg/0.3 mL IJ SOAJ injection Inject 0.3 mg into the muscle once. Use prn, takes allergy shots and has this on hand for adverse reactions    [provider]  levothyroxine (SYNTHROID, LEVOTHROID) 75 MCG tablet Take 1 tablet (75 mcg total) by mouth daily before breakfast. 12/29/16   Venia Carbon, MD  loratadine (CLARITIN) 10 MG tablet Take 10 mg by mouth daily.    [provider]  PRESCRIPTION MEDICATION     [provider]    Family History Family History  Problem Relation Age  of Onset  . COPD Mother   . Cancer Mother        unclear diagnosis  . Transient ischemic attack Mother   . Diabetes Sister   . Hypertension Sister   . Stroke Other   . Cancer Paternal Grandmother        ?breast  . Heart disease Neg Hx     Social History Social History   Tobacco Use  . Smoking status: Never Smoker  . Smokeless tobacco: Never Used  Substance Use Topics  . Alcohol use: Yes    Comment: rare wine  . Drug use: No     Allergies   Penicillins and Latex   Review of Systems Review of Systems  Respiratory: Positive for cough and shortness of breath.   Cardiovascular: Positive for chest pain.  All other systems  reviewed and are negative.    Physical Exam Updated Vital Signs Ht 5\' 3"  (1.6 m)   Wt 60.8 kg (134 lb)   BMI 23.74 kg/m   Physical Exam  Constitutional: She is oriented to person, place, and time. She appears well-developed and well-nourished.  HENT:  Head: Normocephalic and atraumatic.  Eyes: Conjunctivae and EOM are normal.  Neck: Normal range of motion.  Cardiovascular: Normal rate and regular rhythm.  Pulmonary/Chest: Effort normal and breath sounds normal. No stridor. No respiratory distress.  Abdominal: Soft. She exhibits no distension.  Musculoskeletal: Normal range of motion. She exhibits no edema or deformity.  Neurological: She is alert and oriented to person, place, and time.  Skin: Skin is warm and dry.  Nursing note and vitals reviewed.    ED Treatments / Results  Labs (all labs ordered are listed, but only abnormal results are displayed) Labs Reviewed  COMPREHENSIVE METABOLIC PANEL - Abnormal; Notable for the following components:      Result Value   Calcium 8.4 (*)    Total Protein 6.2 (*)    Albumin 2.9 (*)    ALT 12 (*)    All other components within normal limits  CULTURE, BLOOD (ROUTINE X 2)  CULTURE, BLOOD (ROUTINE X 2)  CBC WITH DIFFERENTIAL/PLATELET  URINALYSIS, ROUTINE W REFLEX MICROSCOPIC  LACTIC ACID, PLASMA  LACTIC ACID, PLASMA  I-STAT CG4 LACTIC ACID, ED    EKG None  Radiology Dg Chest 2 View  Result Date: 09/09/2017 CLINICAL DATA:  Two weeks of persistent cough worse at night, history GERD, nonsmoker, allergic asthma EXAM: CHEST - 2 VIEW COMPARISON:  06/11/2013 FINDINGS: Normal heart size, mediastinal contours, and pulmonary vascularity. Azygos fissure noted. LEFT lower lobe infiltrate consistent with pneumonia. Remaining lungs appear mildly hyperinflated but clear. No pleural effusion or pneumothorax. Bones demineralized. IMPRESSION: LEFT lower lobe infiltrate consistent with pneumonia. Electronically Signed   By: Lavonia Dana M.D.    On: 09/09/2017 15:29    Procedures Procedures (including critical care time)  CRITICAL CARE Performed by: Merrily Pew Total critical care time: 35 minutes Critical care time was exclusive of separately billable procedures and treating other patients. Critical care was necessary to treat or prevent imminent or life-threatening deterioration. Critical care was time spent personally by me on the following activities: development of treatment plan with patient and/or surrogate as well as nursing, discussions with consultants, evaluation of patient's response to treatment, examination of patient, obtaining history from patient or surrogate, ordering and performing treatments and interventions, ordering and review of laboratory studies, ordering and review of radiographic studies, pulse oximetry and re-evaluation of patient's condition.   Medications Ordered in ED  Medications  lactated ringers bolus 1,000 mL (1,000 mLs Intravenous New Bag/Given 09/09/17 1459)  cefTRIAXone (ROCEPHIN) 1 g in sodium chloride 0.9 % 100 mL IVPB (1 g Intravenous New Bag/Given 09/09/17 1554)  azithromycin (ZITHROMAX) 500 mg in sodium chloride 0.9 % 250 mL IVPB (500 mg Intravenous New Bag/Given 09/09/17 1555)  lactated ringers bolus 1,000 mL (1,000 mLs Intravenous New Bag/Given 09/09/17 1554)     Initial Impression / Assessment and Plan / ED Course  I have reviewed the triage vital signs and the nursing notes.  Pertinent labs & imaging results that were available during my care of the patient were reviewed by me and considered in my medical decision making (see chart for details).     Initially thought patient's vital sign abnormalities were likely secondary to dehydration however chest x-ray did show pneumonia so code sepsis called at that time.  30 cc/kg of fluids were ordered.  Blood cultures were drawn before azithromycin and Rocephin were started.  After liter and half of fluids (500 cc given by EMS) her blood  pressures improved to the 90s over 60s.  Her heart rate improved from 139-117.  I think it's unlikely that she has septic shock however will likely need observed overnight to make sure that her blood pressures and lactics stay stable.  Care transferred to Dr. Leonette Monarch pending reassessment for stability.  Final Clinical Impressions(s) / ED Diagnoses   Final diagnoses:  Community acquired pneumonia of left lower lobe of lung [facility])    ED Discharge Orders    None       Olanda Boughner, Corene Cornea, MD 09/09/17 1610

## 2017-09-09 NOTE — Progress Notes (Signed)
Patient states that she has the [place] DNR form at home and would like to be a DNR.  PCP was notified.

## 2017-09-09 NOTE — ED Notes (Signed)
ED TO INPATIENT HANDOFF REPORT  Name/Age/Gender Ariel Phillips 84 y.o. female  Code Status Advance Directive Documentation     Most Recent Value  Type of Advance Directive  Healthcare Power of Attorney, Living will, Out of facility DNR (pink MOST or yellow form)  Pre-existing out of facility DNR order (yellow form or pink MOST form)  Yellow form placed in chart (order not valid for inpatient use)  "MOST" Form in Place?  -      Home/SNF/Other Home  Chief Complaint hypotension  Level of Care/Admitting Diagnosis ED Disposition    ED Disposition Condition [place]: [place] [100102]  Level of Care: Telemetry [5]  Admit to tele based on following criteria: Complex arrhythmia (Bradycardia/Tachycardia)  Diagnosis: Sepsis [facility]) [[id number]]  Admitting Physician: Velvet Bathe [[id number]]  Attending Physician: Velvet Bathe [[id number]]  PT Class (Do Not Modify): Observation [104]  PT Acc Code (Do Not Modify): Observation [10022]       Medical History Past Medical History:  Diagnosis Date  . Allergic asthma   . Allergic rhinitis due to pollen   . Allergy   . GERD (gastroesophageal reflux disease)   . IBS (irritable bowel syndrome)   . Osteopenia   . Unspecified hypothyroidism     Allergies Allergies  Allergen Reactions  . Penicillins Diarrhea  . Latex Rash    Questionable allergy per patient, rash developed at site of latex band-aid placed after toenail removal    IV Location/Drains/Wounds Patient Lines/Drains/Airways Status   Active Line/Drains/Airways    Name:   Placement date:   Placement time:   Site:   Days:   Peripheral IV 09/09/17 Left Forearm   09/09/17    -    Forearm   less than 1   Peripheral IV 09/09/17 Right Antecubital   09/09/17    1549    Antecubital   less than 1          Labs/Imaging Results for orders placed or performed during the hospital encounter of 09/09/17 (from the past 48 hour(s))  CBC with  Differential     Status: None   Collection Time: 09/09/17  3:00 PM  Result Value Ref Range   WBC 7.0 4.0 - 10.5 K/uL   RBC 4.04 3.87 - 5.11 MIL/uL   Hemoglobin 12.6 12.0 - 15.0 g/dL   HCT 38.8 36.0 - 46.0 %   MCV 96.0 78.0 - 100.0 fL   MCH 31.2 26.0 - 34.0 pg   MCHC 32.5 30.0 - 36.0 g/dL   RDW 12.8 11.5 - 15.5 %   Platelets 313 150 - 400 K/uL   Neutrophils Relative % 71 %   Neutro Abs 5.0 1.7 - 7.7 K/uL   Lymphocytes Relative 16 %   Lymphs Abs 1.1 0.7 - 4.0 K/uL   Monocytes Relative 12 %   Monocytes Absolute 0.8 0.1 - 1.0 K/uL   Eosinophils Relative 1 %   Eosinophils Absolute 0.1 0.0 - 0.7 K/uL   Basophils Relative 0 %   Basophils Absolute 0.0 0.0 - 0.1 K/uL    Comment: Performed at [facility], [place] [address] [place], [place] [address]  Comprehensive metabolic panel     Status: Abnormal   Collection Time: 09/09/17  3:00 PM  Result Value Ref Range   Sodium 139 135 - 145 mmol/L   Potassium 4.9 3.5 - 5.1 mmol/L   Chloride 107 101 - 111 mmol/L   CO2 23 22 - 32  mmol/L   Glucose, Bld 93 65 - 99 mg/dL   BUN 15 6 - 20 mg/dL   Creatinine, Ser 0.81 0.44 - 1.00 mg/dL   Calcium 8.4 (L) 8.9 - 10.3 mg/dL   Total Protein 6.2 (L) 6.5 - 8.1 g/dL   Albumin 2.9 (L) 3.5 - 5.0 g/dL   AST 23 15 - 41 U/L   ALT 12 (L) 14 - 54 U/L   Alkaline Phosphatase 59 38 - 126 U/L   Total Bilirubin 1.0 0.3 - 1.2 mg/dL   GFR calc non Af Amer >60 >60 mL/min   GFR calc Af Amer >60 >60 mL/min    Comment: (NOTE) The eGFR has been calculated using the CKD EPI equation. This calculation has not been validated in all clinical situations. eGFR's persistently <60 mL/min signify possible Chronic Kidney Disease.    Anion gap 9 5 - 15    Comment: Performed at [facility], [place] [address] [place], [place] [address]  Urinalysis, Routine w reflex microscopic     Status: Abnormal   Collection Time: 09/09/17  3:31 PM  Result Value Ref Range   Color, Urine YELLOW YELLOW    APPearance CLOUDY (A) CLEAR   Specific Gravity, Urine 1.015 1.005 - 1.030   pH 5.0 5.0 - 8.0   Glucose, UA NEGATIVE NEGATIVE mg/dL   Hgb urine dipstick MODERATE (A) NEGATIVE   Bilirubin Urine NEGATIVE NEGATIVE   Ketones, ur NEGATIVE NEGATIVE mg/dL   Protein, ur 100 (A) NEGATIVE mg/dL   Nitrite NEGATIVE NEGATIVE   Leukocytes, UA NEGATIVE NEGATIVE   RBC / HPF 6-10 0 - 5 RBC/hpf   WBC, UA 11-20 0 - 5 WBC/hpf   Bacteria, UA NONE SEEN NONE SEEN   Squamous Epithelial / LPF 0-5 0 - 5   WBC Clumps PRESENT    Mucus PRESENT    Budding Yeast PRESENT    Hyaline Casts, UA PRESENT     Comment: Performed at [facility], [place] [address], [place], [place] [address]  Lactic acid, plasma     Status: None   Collection Time: 09/09/17  3:43 PM  Result Value Ref Range   Lactic Acid, Venous 1.7 0.5 - 1.9 mmol/L    Comment: Performed at [facility], [place] [address] [place], [place] [address]  I-Stat CG4 Lactic Acid, ED  (not at  [facility])     Status: None   Collection Time: 09/09/17  4:01 PM  Result Value Ref Range   Lactic Acid, Venous 1.71 0.5 - 1.9 mmol/L  Lactic acid, plasma     Status: None   Collection Time: 09/09/17  5:12 PM  Result Value Ref Range   Lactic Acid, Venous 1.3 0.5 - 1.9 mmol/L    Comment: Performed at [facility], [place] [address] [place], [place] [address]  I-Stat CG4 Lactic Acid, ED  (not at  [facility])     Status: None   Collection Time: 09/09/17  5:26 PM  Result Value Ref Range   Lactic Acid, Venous 1.22 0.5 - 1.9 mmol/L   Dg Chest 2 View  Result Date: 09/09/2017 CLINICAL DATA:  Two weeks of persistent cough worse at night, history GERD, nonsmoker, allergic asthma EXAM: CHEST - 2 VIEW COMPARISON:  06/11/2013 FINDINGS: Normal heart size, mediastinal contours, and pulmonary vascularity. Azygos fissure noted. LEFT lower lobe infiltrate consistent with pneumonia. Remaining lungs appear mildly hyperinflated but clear. No pleural  effusion or pneumothorax. Bones demineralized. IMPRESSION: LEFT lower lobe  infiltrate consistent with pneumonia. Electronically Signed   By: Lavonia Dana M.D.   On: 09/09/2017 15:29    Pending Labs Unresulted Labs (From admission, onward)   Start     Ordered   09/09/17 1534  Blood Culture (routine x 2)  BLOOD CULTURE X 2,   STAT     09/09/17 1534   Signed and Held  Culture, blood (routine x 2) Call MD if unable to obtain prior to antibiotics being given  BLOOD CULTURE X 2,   R    Comments:  If blood cultures drawn in Emergency Department - Do not draw and cancel order    Signed and Held   Signed and Held  Culture, sputum-assessment  Once,   R     Signed and Held   Signed and Held  Gram stain  Once,   R     Signed and Held   Signed and Held  HIV antibody (Routine Screening)  Once,   R     Signed and Held   Signed and Held  Strep pneumoniae urinary antigen  Once,   R     Signed and Held   Signed and Held  CBC  (heparin)  Once,   R    Comments:  Baseline for heparin therapy IF NOT ALREADY DRAWN.  Notify MD if PLT < 100 K.    Signed and Held   Signed and Held  Creatinine, serum  (heparin)  Once,   R    Comments:  Baseline for heparin therapy IF NOT ALREADY DRAWN.    Signed and Held      Vitals/Pain Today's Vitals   09/09/17 1700 09/09/17 1703 09/09/17 1730 09/09/17 1731  BP: 1_0   Pulse: (!) 121 (!) 121  (!) 116  Resp: (!) 24 (!) 22  18  SpO2: 100% 100%  97%  Weight:      Height:        Isolation Precautions No active isolations  Medications Medications  lactated ringers bolus 1,000 mL (0 mLs Intravenous Stopped 09/09/17 1659)  cefTRIAXone (ROCEPHIN) 1 g in sodium chloride 0.9 % 100 mL IVPB (0 g Intravenous Stopped 09/09/17 1624)  azithromycin (ZITHROMAX) 500 mg in sodium chloride 0.9 % 250 mL IVPB (0 mg Intravenous Stopped 09/09/17 1655)  lactated ringers bolus 1,000 mL (0 mLs Intravenous Stopped 09/09/17 1758)    Mobility walks with person assist

## 2017-09-09 NOTE — Progress Notes (Signed)
Subjective  CC:  Chief Complaint  Patient presents with  . Sore Throat    sneezing, drippy nose, sore throat, cough some productive.     HPI: Ariel Phillips is a 84 y.o. female who presents to the office today to address the problems listed above in the chief complaint.  Pleasant healthy 84 yo who felt lightheaded upon standing up and walking to exam room  Reports 2 week bronchitic like illness with wheezing evaluated twice by pcp office: treated with prednisone, albuterol and cold/cough meds. Today had st resolved with advil. Feels fine now  However, bp was 70s/50s upon arrival and hr 130s  orhthostatics are negative but persistent low bp and elevated regular hr.   Using albuterol q 4 hours for last several days and hasn't been drinking well. Denies cp, palpitations but was lightheaded. No sob now. No GI sxs. No Cardiac history.   Has h/o hypothyroidism last tsh was nl 08/2016. Due for recheck.   I reviewed the patients updated PMH, FH, and SocHx.    Patient Active Problem List   Diagnosis Date Noted  . Allergic rhinitis 08/22/2017  . Mood disorder ([place]) 12/30/2016  . Memory loss 12/02/2015  . Benign essential tremor 07/20/2015  . Advanced directives, counseling/discussion 12/19/2013  . Routine general medical examination at a health care facility 12/13/2012  . Hypothyroidism 12/12/2011  . Allergic rhinitis due to pollen   . Allergic asthma   . GERD (gastroesophageal reflux disease)   . IBS (irritable bowel syndrome)   . Osteopenia    Current Meds  Medication Sig  . albuterol (PROAIR HFA) 108 (90 Base) MCG/ACT inhaler INHALE 2 PUFFS INTO THE LUNGS 3 (THREE) TIMES DAILY AS NEEDED.  . Azelastine HCl 137 MCG/SPRAY SOLN USE 1 SPRAY IN BOTH NOSTRILS TWICE A DAY  . benzonatate (TESSALON) 100 MG capsule Take 1-2 capsules (100-200 mg total) by mouth 3 (three) times daily as needed.  . carboxymethylcellulose (REFRESH PLUS) 0.5 % SOLN 2 drops daily as needed.  Marland Kitchen EPINEPHrine  0.3 mg/0.3 mL IJ SOAJ injection Inject 0.3 mg into the muscle once. Use prn, takes allergy shots and has this on hand for adverse reactions  . levothyroxine (SYNTHROID, LEVOTHROID) 75 MCG tablet Take 1 tablet (75 mcg total) by mouth daily before breakfast.  . loratadine (CLARITIN) 10 MG tablet Take 10 mg by mouth daily.  Marland Kitchen PRESCRIPTION MEDICATION     Allergies: Patient is allergic to penicillins and latex. Family History: Patient family history includes COPD in her mother; Cancer in her mother and paternal grandmother; Diabetes in her sister; Hypertension in her sister; Stroke in her other; Transient ischemic attack in her mother. Social History:  Patient  reports that she has never smoked. She has never used smokeless tobacco. She reports that she drinks alcohol. She reports that she does not use drugs.  Review of Systems: Constitutional: Negative for fever malaise or anorexia Cardiovascular: negative for chest pain Respiratory: negative for SOB or persistent cough Gastrointestinal: negative for abdominal pain  Objective  Vitals: BP (!) 78/50   Pulse (!) 139   Temp 97.9 F (36.6 C) (Oral)   Resp 16   Wt 134 lb (60.8 kg)   SpO2 98%   BMI 23.74 kg/m  General: no acute distress , A&Ox3 HEENT: [place], conjunctiva normal, Oropharynx clear, dry MMM,neck is supple Cardiovascular:  Sinus tach w/o murmur Respiratory:  Good breath sounds bilaterally, CTAB with normal respiratory effort Skin:  Warm, no rashes  Assessment  1. Sinus  tachycardia   2. Hypotension, unspecified hypotension type   3. Lightheadedness      Plan   Suspect all due to albuterol and dehydration but needs further eval:  EMS to transport to [place].   Follow up: No follow-ups on file.    Commons side effects, risks, benefits, and alternatives for medications and treatment plan prescribed today were discussed, and the patient expressed understanding of the given instructions. Patient is instructed to call or  message via MyChart if he/she has any questions or concerns regarding our treatment plan. No barriers to understanding were identified. We discussed Red Flag symptoms and signs in detail. Patient expressed understanding regarding what to do in case of urgent or emergency type symptoms.   Medication list was reconciled, printed and provided to the patient in AVS. Patient instructions and summary information was reviewed with the patient as documented in the AVS. This note was prepared with assistance of Dragon voice recognition software. Occasional wrong-word or sound-a-like substitutions may have occurred due to the inherent limitations of voice recognition software  No orders of the defined types were placed in this encounter.  No orders of the defined types were placed in this encounter.

## 2017-09-09 NOTE — H&P (Signed)
History and Physical    Ariel Phillips [id number] DOB: 1932-09-10 DOA: 09/09/2017  PCP: Venia Carbon, MD  Patient coming from: home  Chief Complaint: sent from pcp for hypotension and tachycardia  HPI: Ariel Phillips is a 84 y.o. female with medical history significant of hypothyroidism, seasonal allergies, GERD.  Presenting with tachycardia and hypotension.  Patient was found mostly at primary care's office and was sent to the emergency department for further evaluation recommendations.  She currently feels well and denies any symptomatology but was found to have pneumonia on chest x-ray.  She denies any fevers, cough, difficulty breathing.  In the emergency department patient was given IV fluids and antibiotics were subsequently consulted for further evaluation recommendations.  Review of Systems: As per HPI otherwise 10 point review of systems negative.    Past Medical History:  Diagnosis Date  . Allergic asthma   . Allergic rhinitis due to pollen   . Allergy   . GERD (gastroesophageal reflux disease)   . IBS (irritable bowel syndrome)   . Osteopenia   . Unspecified hypothyroidism     Past Surgical History:  Procedure Laterality Date  . ANAL FISSURE REPAIR  1950's  . BASAL CELL CARCINOMA EXCISION  2000's   nose  . CHOLECYSTECTOMY    . COLONOSCOPY  Multiple   Negative screening exams  . [place] or so   with fissure repair  . TUBAL LIGATION       reports that she has never smoked. She has never used smokeless tobacco. She reports that she drinks alcohol. She reports that she does not use drugs.  Allergies  Allergen Reactions  . Penicillins Diarrhea  . Latex Rash    Questionable allergy per patient, rash developed at site of latex band-aid placed after toenail removal    Family History  Problem Relation Age of Onset  . COPD Mother   . Cancer Mother        unclear diagnosis  . Transient ischemic attack Mother   . Diabetes Sister   .  Hypertension Sister   . Stroke Other   . Cancer Paternal Grandmother        ?breast  . Heart disease Neg Hx    Prior to Admission medications   Medication Sig Start Date End Date Taking? Authorizing Provider  albuterol (PROAIR HFA) 108 (90 Base) MCG/ACT inhaler INHALE 2 PUFFS INTO THE LUNGS 3 (THREE) TIMES DAILY AS NEEDED. Patient taking differently: Inhale 2 puffs into the lungs every 6 (six) hours as needed for wheezing or shortness of breath.  08/22/17  Yes Bedsole, Amy E, MD  Azelastine HCl 137 MCG/SPRAY SOLN USE 1 SPRAY IN BOTH NOSTRILS TWICE A DAY Patient taking differently: USE 1 SPRAY IN BOTH NOSTRILS DAILY 07/12/17  Yes Venia Carbon, MD  benzonatate (TESSALON) 100 MG capsule Take 1-2 capsules (100-200 mg total) by mouth 3 (three) times daily as needed. Patient taking differently: Take 100-200 mg by mouth 3 (three) times daily as needed for cough.  09/06/17  Yes Elby Beck, FNP  carboxymethylcellulose (REFRESH PLUS) 0.5 % SOLN Place 2 drops into both eyes daily as needed (dry eyes).    Yes [provider]  ibuprofen (ADVIL,MOTRIN) 200 MG tablet Take 200 mg by mouth every 6 (six) hours as needed for fever or moderate pain.   Yes [provider]  levothyroxine (SYNTHROID, LEVOTHROID) 75 MCG tablet Take 1 tablet (75 mcg total) by mouth daily before breakfast. 12/29/16  Yes  Venia Carbon, MD  loratadine (CLARITIN) 10 MG tablet Take 10 mg by mouth every evening.    Yes [provider]  Throat Lozenges (LOZENGES MT) Use as directed 1 lozenge in the mouth or throat every 2 (two) hours as needed (cough).   Yes [provider]  EPINEPHrine 0.3 mg/0.3 mL IJ SOAJ injection Inject 0.3 mg into the muscle once. Use prn, takes allergy shots and has this on hand for adverse reactions    [provider]    Physical Exam: Vitals:   09/09/17 1600 09/09/17 1630 09/09/17 1700 09/09/17 1703  BP: (!) 82/60 90/76 100/77 90/76  Pulse: (!) 113 (!) 116  (!) 121 (!) 121  Resp: (!) 22 (!) 26 (!) 24 (!) 22  SpO2: 99% 100% 100% 100%  Weight:      Height:        Constitutional: NAD, calm, comfortable Vitals:   09/09/17 1600 09/09/17 1630 09/09/17 1700 09/09/17 1703  BP: (!) 82/60 90/76 100/77 90/76  Pulse: (!) 113 (!) 116 (!) 121 (!) 121  Resp: (!) 22 (!) 26 (!) 24 (!) 22  SpO2: 99% 100% 100% 100%  Weight:      Height:       Eyes: PERRL, lids and conjunctivae normal ENMT: Mucous membranes are moist. Posterior pharynx clear of any exudate or lesions. Neck: normal, supple, no masses, no thyromegaly Respiratory: Rales over left lower lobe, no wheezes, equal chest rise Cardiovascular: Regular rate and rhythm, no murmurs / rubs / gallops. No extremity edema.  Abdomen: no tenderness, no masses palpated. No hepatosplenomegaly. Bowel sounds positive.  Musculoskeletal: no clubbing / cyanosis. No joint deformity upper and lower extremities. Good ROM, no contractures. Normal muscle tone.  Skin: no rashes, lesions, ulcers. No induration, on limited exam Neurologic: No facial asymmetry, answers questions appropriately, moves extremities equally Psychiatric: Normal judgment and insight. Alert and oriented x 3. Normal mood.    Labs on Admission: I have personally reviewed following labs and imaging studies  CBC: Recent Labs  Lab 09/09/17 1500  WBC 7.0  NEUTROABS 5.0  HGB 12.6  HCT 38.8  MCV 96.0  PLT 875   Basic Metabolic Panel: Recent Labs  Lab 09/09/17 1500  NA 139  K 4.9  CL 107  CO2 23  GLUCOSE 93  BUN 15  CREATININE 0.81  CALCIUM 8.4*   GFR: Estimated Creatinine Clearance: 44.3 mL/min (by C-G formula based on SCr of 0.81 mg/dL). Liver Function Tests: Recent Labs  Lab 09/09/17 1500  AST 23  ALT 12*  ALKPHOS 59  BILITOT 1.0  PROT 6.2*  ALBUMIN 2.9*   No results for input(s): LIPASE, AMYLASE in the last 168 hours. No results for input(s): AMMONIA in the last 168 hours. Coagulation Profile: No results for  input(s): INR, PROTIME in the last 168 hours. Cardiac Enzymes: No results for input(s): CKTOTAL, CKMB, CKMBINDEX, TROPONINI in the last 168 hours. BNP (last 3 results) No results for input(s): PROBNP in the last 8760 hours. HbA1C: No results for input(s): HGBA1C in the last 72 hours. CBG: No results for input(s): GLUCAP in the last 168 hours. Lipid Profile: No results for input(s): CHOL, HDL, LDLCALC, TRIG, CHOLHDL, LDLDIRECT in the last 72 hours. Thyroid Function Tests: No results for input(s): TSH, T4TOTAL, FREET4, T3FREE, THYROIDAB in the last 72 hours. Anemia Panel: No results for input(s): VITAMINB12, FOLATE, FERRITIN, TIBC, IRON, RETICCTPCT in the last 72 hours. Urine analysis:    Component Value Date/Time   COLORURINE YELLOW  09/09/2017 1531   APPEARANCEUR CLOUDY (A) 09/09/2017 1531   APPEARANCEUR Hazy 06/11/2013 2307   LABSPEC 1.015 09/09/2017 1531   LABSPEC 1.017 06/11/2013 2307   PHURINE 5.0 09/09/2017 1531   GLUCOSEU NEGATIVE 09/09/2017 1531   GLUCOSEU Negative 06/11/2013 2307   HGBUR MODERATE (A) 09/09/2017 1531   BILIRUBINUR NEGATIVE 09/09/2017 1531   BILIRUBINUR Negative 06/11/2013 2307   KETONESUR NEGATIVE 09/09/2017 1531   PROTEINUR 100 (A) 09/09/2017 1531   UROBILINOGEN 0.2 05/16/2012 1209   NITRITE NEGATIVE 09/09/2017 1531   LEUKOCYTESUR NEGATIVE 09/09/2017 1531   LEUKOCYTESUR Negative 06/11/2013 2307    Radiological Exams on Admission: Dg Chest 2 View  Result Date: 09/09/2017 CLINICAL DATA:  Two weeks of persistent cough worse at night, history GERD, nonsmoker, allergic asthma EXAM: CHEST - 2 VIEW COMPARISON:  06/11/2013 FINDINGS: Normal heart size, mediastinal contours, and pulmonary vascularity. Azygos fissure noted. LEFT lower lobe infiltrate consistent with pneumonia. Remaining lungs appear mildly hyperinflated but clear. No pleural effusion or pneumothorax. Bones demineralized. IMPRESSION: LEFT lower lobe infiltrate consistent with pneumonia.  Electronically Signed   By: Lavonia Dana M.D.   On: 09/09/2017 15:29    EKG: Independently reviewed.  Junctional tachycardia with no ST elevations or depressions.  Assessment/Plan Active Problems:   Sepsis ([place]) -Secondary to pneumonia -Follow-up with blood cultures -Continue IV fluids  Hypotension -Secondary to sepsis.  Improving fluid rehydration   PNA (pneumonia) -Plan will be to treat with IV antibiotics and obtain sputum culture -Placed routine pneumonia order set    Allergic asthma -Stable, will continue home antihistamine if patient develops any symptomatology.   GERD (gastroesophageal reflux disease)    Hypothyroidism  -Stable continue home Synthroid dose    DVT prophylaxis: heparin Code Status:Full Family Communication: none at bedside Disposition Plan: telemetry Consults called: none Admission status: obs   Velvet Bathe MD [facility] Pager [phone]  If 7PM-7AM, please contact night-coverage [email or web address] Password [facility]  09/09/2017, 5:33 PM

## 2017-09-09 NOTE — ED Notes (Signed)
Bed: FP84 Expected date:  Expected time:  Means of arrival:  Comments: Hypotension

## 2017-09-09 NOTE — ED Triage Notes (Signed)
Pt was seen at [facility]. Pt reports 2 weeks of persistent cough (worse at night). Pt reports that cough is worse at night. Pt was rx'd albuterol, tessalon pearls, prednisone and OTC cough meds and has been using as prescribed with no relief of sx. Pt c/o chest pressure/congestion, non-productive cough. Pt presents tachycardic, cough, hypotensive.

## 2017-09-09 NOTE — ED Provider Notes (Signed)
I assumed care of this patient from Dr. Dayna Barker at 1600.  Please see their note for further details of Hx, PE.  Briefly patient is a 84 y.o. female who presented with tachycardia and hypotension.  Found to have pneumonia.  Labs are reassuring.   code sepsis was initiated and patient was started on IV fluids and empiric antibiotics.  Currently awaiting reevaluation after IV fluids to see his blood pressure response.  On reevaluation, BP is improving with IVF. Will discuss admission with medicine for further management.         Fatima Blank, MD 09/09/17 [phone]

## 2017-09-10 ENCOUNTER — Encounter (HOSPITAL_COMMUNITY): Payer: Self-pay | Admitting: *Deleted

## 2017-09-10 DIAGNOSIS — Z79890 Hormone replacement therapy (postmenopausal): Secondary | ICD-10-CM | POA: Diagnosis not present

## 2017-09-10 DIAGNOSIS — K219 Gastro-esophageal reflux disease without esophagitis: Secondary | ICD-10-CM | POA: Diagnosis present

## 2017-09-10 DIAGNOSIS — A419 Sepsis, unspecified organism: Principal | ICD-10-CM

## 2017-09-10 DIAGNOSIS — Z79899 Other long term (current) drug therapy: Secondary | ICD-10-CM | POA: Diagnosis not present

## 2017-09-10 DIAGNOSIS — Z66 Do not resuscitate: Secondary | ICD-10-CM | POA: Diagnosis present

## 2017-09-10 DIAGNOSIS — J181 Lobar pneumonia, unspecified organism: Secondary | ICD-10-CM | POA: Diagnosis not present

## 2017-09-10 DIAGNOSIS — E039 Hypothyroidism, unspecified: Secondary | ICD-10-CM

## 2017-09-10 DIAGNOSIS — Z880 Allergy status to penicillin: Secondary | ICD-10-CM | POA: Diagnosis not present

## 2017-09-10 DIAGNOSIS — Z91040 Latex allergy status: Secondary | ICD-10-CM | POA: Diagnosis not present

## 2017-09-10 DIAGNOSIS — J45909 Unspecified asthma, uncomplicated: Secondary | ICD-10-CM | POA: Diagnosis present

## 2017-09-10 LAB — HIV ANTIBODY (ROUTINE TESTING W REFLEX): HIV Screen 4th Generation wRfx: NONREACTIVE

## 2017-09-10 LAB — STREP PNEUMONIAE URINARY ANTIGEN: Strep Pneumo Urinary Antigen: NEGATIVE

## 2017-09-10 MED ORDER — LOPERAMIDE HCL 2 MG PO CAPS
2.0000 mg | ORAL_CAPSULE | ORAL | Status: DC | PRN
  Administered 2017-09-10 (×2): 2 mg via ORAL
  Filled 2017-09-10 (×2): qty 1

## 2017-09-10 NOTE — Plan of Care (Signed)
  Problem: Pain Managment: Goal: General experience of comfort will improve Outcome: Progressing   

## 2017-09-10 NOTE — Progress Notes (Addendum)
Patient ID: Ariel Phillips, female   DOB: January 15, 1933, 84 y.o.   MRN: [id number]  PROGRESS NOTE    Ariel Phillips  VVO:[id number] DOB: [birth year] DOA: 09/09/2017 PCP: Venia Carbon, MD   Brief Narrative:  84 years old female with history of hypothyroidism, seasonal allergies, GERD presented with hypotension and tachycardia from PCPs office.  She was found to have pneumonia on chest x-ray.  She was started on antibiotics.   Assessment & Plan:   Active Problems:   Allergic asthma   GERD (gastroesophageal reflux disease)   Hypothyroidism   Sepsis (HCC)   PNA (pneumonia)  Sepsis secondary to pneumonia causing hypotension -Blood pressure is improving.  Decrease normal saline to 75 cc an hour.  Follow cultures.  Antibiotic plan as below  Community-acquired bacterial left lower lobar pneumonia -Continue Rocephin and Zithromax.  Follow cultures.  Urine Legionella and streptococcal antigen -Repeat a.m. labs  Hypothyroidism -Continue Synthroid    DVT prophylaxis: Heparin Code Status: DNR Family Communication: None at bedside Disposition Plan: Probable discharge tomorrow if clinically stable  Consultants: None  Procedures: None  Antimicrobials: Rocephin and Zithromax from 09/09/2017 onwards   Subjective: Patient seen and examined at bedside.  She still complains of cough.  No overnight fever, nausea or vomiting.  Objective: Vitals:   09/09/17 1830 09/09/17 1851 09/09/17 2212 09/10/17 0556  BP: (!) 92/47 114/72 119/61 124/64  Pulse: 87 83 92 88  Resp: 15 18 16 20   Temp:  (!) 97.5 F (36.4 C) 97.8 F (36.6 C) 98.1 F (36.7 C)  TempSrc:  Oral Oral Oral  SpO2: 97% 98% 96% 96%  Weight:      Height:        Intake/Output Summary (Last 24 hours) at 09/10/2017 0916 Last data filed at 09/10/2017 0630 Gross per 24 hour  Intake 1682.92 ml  Output 350 ml  Net 1332.92 ml   Filed Weights   09/09/17 1500  Weight: 60.8 kg (134 lb)    Examination:  General exam:  Appears calm and comfortable  Respiratory system: Bilateral decreased breath sound at bases Cardiovascular system: S1 & S2 heard, rate controlled  gastrointestinal system: Abdomen is nondistended, soft and nontender. Normal bowel sounds heard. Extremities: No cyanosis, clubbing, edema    Data Reviewed: I have personally reviewed following labs and imaging studies  CBC: Recent Labs  Lab 09/09/17 1500  WBC 7.0  NEUTROABS 5.0  HGB 12.6  HCT 38.8  MCV 96.0  PLT 269   Basic Metabolic Panel: Recent Labs  Lab 09/09/17 1500  NA 139  K 4.9  CL 107  CO2 23  GLUCOSE 93  BUN 15  CREATININE 0.81  CALCIUM 8.4*   GFR: Estimated Creatinine Clearance: 44.3 mL/min (by C-G formula based on SCr of 0.81 mg/dL). Liver Function Tests: Recent Labs  Lab 09/09/17 1500  AST 23  ALT 12*  ALKPHOS 59  BILITOT 1.0  PROT 6.2*  ALBUMIN 2.9*   No results for input(s): LIPASE, AMYLASE in the last 168 hours. No results for input(s): AMMONIA in the last 168 hours. Coagulation Profile: No results for input(s): INR, PROTIME in the last 168 hours. Cardiac Enzymes: No results for input(s): CKTOTAL, CKMB, CKMBINDEX, TROPONINI in the last 168 hours. BNP (last 3 results) No results for input(s): PROBNP in the last 8760 hours. HbA1C: No results for input(s): HGBA1C in the last 72 hours. CBG: No results for input(s): GLUCAP in the last 168 hours. Lipid Profile: No results for input(s): CHOL, HDL, LDLCALC,  TRIG, CHOLHDL, LDLDIRECT in the last 72 hours. Thyroid Function Tests: No results for input(s): TSH, T4TOTAL, FREET4, T3FREE, THYROIDAB in the last 72 hours. Anemia Panel: No results for input(s): VITAMINB12, FOLATE, FERRITIN, TIBC, IRON, RETICCTPCT in the last 72 hours. Sepsis Labs: Recent Labs  Lab 09/09/17 1543 09/09/17 1601 09/09/17 1712 09/09/17 1726  LATICACIDVEN 1.7 1.71 1.3 1.22    Recent Results (from the past 240 hour(s))  Blood Culture (routine x 2)     Status: None  (Preliminary result)   Collection Time: 09/09/17  3:34 PM  Result Value Ref Range Status   Specimen Description BLOOD RIGHT ANTECUBITAL  Final   Special Requests   Final    BOTTLES DRAWN AEROBIC AND ANAEROBIC Blood Culture adequate volume   Culture PENDING  Incomplete   Report Status PENDING  Incomplete  Blood Culture (routine x 2)     Status: None (Preliminary result)   Collection Time: 09/09/17  3:39 PM  Result Value Ref Range Status   Specimen Description BLOOD RIGHT HAND  Final   Special Requests   Final    BOTTLES DRAWN AEROBIC AND ANAEROBIC Blood Culture results may not be optimal due to an inadequate volume of blood received in culture bottles   Culture PENDING  Incomplete   Report Status PENDING  Incomplete         Radiology Studies: Dg Chest 2 View  Result Date: 09/09/2017 CLINICAL DATA:  Two weeks of persistent cough worse at night, history GERD, nonsmoker, allergic asthma EXAM: CHEST - 2 VIEW COMPARISON:  06/11/2013 FINDINGS: Normal heart size, mediastinal contours, and pulmonary vascularity. Azygos fissure noted. LEFT lower lobe infiltrate consistent with pneumonia. Remaining lungs appear mildly hyperinflated but clear. No pleural effusion or pneumothorax. Bones demineralized. IMPRESSION: LEFT lower lobe infiltrate consistent with pneumonia. Electronically Signed   By: Lavonia Dana M.D.   On: 09/09/2017 15:29        Scheduled Meds: . heparin  5,000 Units Subcutaneous Q8H  . levothyroxine  75 mcg Oral QAC breakfast   Continuous Infusions: . sodium chloride 125 mL/hr at 09/10/17 0424  . azithromycin    . cefTRIAXone (ROCEPHIN)  IV       LOS: 0 days        Aline August, MD [facility] Pager [phone]  If 7PM-7AM, please contact night-coverage [email or web address] Password [facility] 09/10/2017, 9:16 AM

## 2017-09-11 ENCOUNTER — Telehealth: Payer: Self-pay

## 2017-09-11 LAB — CBC WITH DIFFERENTIAL/PLATELET
Basophils Absolute: 0 10*3/uL (ref 0.0–0.1)
Basophils Relative: 0 %
EOS ABS: 0.1 10*3/uL (ref 0.0–0.7)
Eosinophils Relative: 2 %
HEMATOCRIT: 34.6 % — AB (ref 36.0–46.0)
HEMOGLOBIN: 11.2 g/dL — AB (ref 12.0–15.0)
LYMPHS ABS: 1.3 10*3/uL (ref 0.7–4.0)
Lymphocytes Relative: 25 %
MCH: 30.6 pg (ref 26.0–34.0)
MCHC: 32.4 g/dL (ref 30.0–36.0)
MCV: 94.5 fL (ref 78.0–100.0)
MONOS PCT: 11 %
Monocytes Absolute: 0.6 10*3/uL (ref 0.1–1.0)
NEUTROS PCT: 62 %
Neutro Abs: 3.2 10*3/uL (ref 1.7–7.7)
PLATELETS: 275 10*3/uL (ref 150–400)
RBC: 3.66 MIL/uL — ABNORMAL LOW (ref 3.87–5.11)
RDW: 13 % (ref 11.5–15.5)
WBC: 5.2 10*3/uL (ref 4.0–10.5)

## 2017-09-11 LAB — BASIC METABOLIC PANEL
Anion gap: 9 (ref 5–15)
BUN: 11 mg/dL (ref 6–20)
CALCIUM: 8.1 mg/dL — AB (ref 8.9–10.3)
CHLORIDE: 110 mmol/L (ref 101–111)
CO2: 20 mmol/L — ABNORMAL LOW (ref 22–32)
CREATININE: 0.68 mg/dL (ref 0.44–1.00)
Glucose, Bld: 78 mg/dL (ref 65–99)
Potassium: 3.8 mmol/L (ref 3.5–5.1)
SODIUM: 139 mmol/L (ref 135–145)

## 2017-09-11 LAB — LEGIONELLA PNEUMOPHILA SEROGP 1 UR AG: L. pneumophila Serogp 1 Ur Ag: NEGATIVE

## 2017-09-11 LAB — MAGNESIUM: MAGNESIUM: 1.8 mg/dL (ref 1.7–2.4)

## 2017-09-11 MED ORDER — LEVOFLOXACIN 500 MG PO TABS: 500.0000 mg | ORAL_TABLET | Freq: Every day | ORAL | 0 refills | Status: AC

## 2017-09-11 MED ORDER — ALBUTEROL SULFATE HFA 108 (90 BASE) MCG/ACT IN AERS: 2.0000 | INHALATION_SPRAY | Freq: Four times a day (QID) | RESPIRATORY_TRACT | Status: DC | PRN

## 2017-09-11 NOTE — Discharge Summary (Signed)
Physician Discharge Summary  Ariel Phillips KPT:[id number] DOB: [birth year] DOA: 09/09/2017  PCP: Venia Carbon, MD  Admit date: 09/09/2017 Discharge date: 09/11/2017  Admitted From: Home Disposition:  Home  Recommendations for Outpatient Follow-up:  1. Follow up with PCP in 1 week  Home Health: No  Equipment/Devices: None  Discharge Condition: Stable CODE STATUS: Full  Diet recommendation: Heart Healthy  Brief/Interim Summary: 84 years old female with history of hypothyroidism, seasonal allergies, GERD presented with hypotension and tachycardia from PCPs office.  She was found to have pneumonia on chest x-ray.  She was started on antibiotics and IV fluids.  Her blood pressure improved.  Her overall condition improved.  Cultures have been negative.  She is afebrile with no leukocytosis.  She will be discharged on oral antibiotics.    Discharge Diagnoses:  Active Problems:   Allergic asthma   GERD (gastroesophageal reflux disease)   Hypothyroidism   Sepsis (HCC)   PNA (pneumonia)   Sepsis secondary to pneumonia causing hypotension -Resolved.  Treated with IV fluids and antibiotics.  Blood pressure has improved.  Discontinue IV fluids.  Cultures negative so far.  Antibiotics plan as below.  Community-acquired bacterial left lower lobar pneumonia -Currently on Rocephin and Zithromax.  Cultures negative so far.  Afebrile with no leukocytosis and stable respiratory status.  Discharge home on oral Levaquin for 5 days  Hypothyroidism -Continue Synthroid     Discharge Instructions  Discharge Instructions    Call MD for:  difficulty breathing, headache or visual disturbances   Complete by:  As directed    Call MD for:  extreme fatigue   Complete by:  As directed    Call MD for:  hives   Complete by:  As directed    Call MD for:  persistant dizziness or light-headedness   Complete by:  As directed    Call MD for:  persistant nausea and vomiting   Complete by:  As  directed    Call MD for:  severe uncontrolled pain   Complete by:  As directed    Call MD for:  temperature >100.4   Complete by:  As directed    Diet - low sodium heart healthy   Complete by:  As directed    Increase activity slowly   Complete by:  As directed      Allergies as of 09/11/2017      Reactions   Penicillins Diarrhea   Latex Rash   Questionable allergy per patient, rash developed at site of latex band-aid placed after toenail removal      Medication List    TAKE these medications   albuterol 108 (90 Base) MCG/ACT inhaler Commonly known as:  PROAIR HFA Inhale 2 puffs into the lungs every 6 (six) hours as needed for wheezing or shortness of breath.   benzonatate 100 MG capsule Commonly known as:  TESSALON Take 1-2 capsules (100-200 mg total) by mouth 3 (three) times daily as needed. What changed:  reasons to take this   carboxymethylcellulose 0.5 % Soln Commonly known as:  REFRESH PLUS Place 2 drops into both eyes daily as needed (dry eyes).   EPINEPHrine 0.3 mg/0.3 mL Soaj injection Commonly known as:  EPI-PEN Inject 0.3 mg into the muscle once. Use prn, takes allergy shots and has this on hand for adverse reactions   ibuprofen 200 MG tablet Commonly known as:  ADVIL,MOTRIN Take 200 mg by mouth every 6 (six) hours as needed for fever or moderate pain.  levofloxacin 500 MG tablet Commonly known as:  LEVAQUIN Take 1 tablet (500 mg total) by mouth daily for 5 days.   levothyroxine 75 MCG tablet Commonly known as:  SYNTHROID, LEVOTHROID Take 1 tablet (75 mcg total) by mouth daily before breakfast.   loratadine 10 MG tablet Commonly known as:  CLARITIN Take 10 mg by mouth every evening.   LOZENGES MT Use as directed 1 lozenge in the mouth or throat every 2 (two) hours as needed (cough).      Follow-up Information    Venia Carbon, MD. Schedule an appointment as soon as possible for a visit in 1 week(s).   Specialties:  Internal Medicine,  Pediatrics Contact information: [place] [address] [phone]          Allergies  Allergen Reactions  . Penicillins Diarrhea  . Latex Rash    Questionable allergy per patient, rash developed at site of latex band-aid placed after toenail removal    Consultations:  None   Procedures/Studies: Dg Chest 2 View  Result Date: 09/09/2017 CLINICAL DATA:  Two weeks of persistent cough worse at night, history GERD, nonsmoker, allergic asthma EXAM: CHEST - 2 VIEW COMPARISON:  06/11/2013 FINDINGS: Normal heart size, mediastinal contours, and pulmonary vascularity. Azygos fissure noted. LEFT lower lobe infiltrate consistent with pneumonia. Remaining lungs appear mildly hyperinflated but clear. No pleural effusion or pneumothorax. Bones demineralized. IMPRESSION: LEFT lower lobe infiltrate consistent with pneumonia. Electronically Signed   By: Lavonia Dana M.D.   On: 09/09/2017 15:29     Subjective: Bedside.  She feels better.  Her cough is better.  No overnight fever, nausea or vomiting.  Discharge Exam: Vitals:   09/10/17 2143 09/11/17 0513  BP: 133/75 (!) 149/80  Pulse: 82 78  Resp: 18 17  Temp: 99.3 F (37.4 C) 98.4 F (36.9 C)  SpO2: 96% 95%   Vitals:   09/10/17 0556 09/10/17 1458 09/10/17 2143 09/11/17 0513  BP: 124/64 124/62 133/75 (!) 149/80  Pulse: 88 89 82 78  Resp: 20 20 18 17   Temp: 98.1 F (36.7 C) 98.6 F (37 C) 99.3 F (37.4 C) 98.4 F (36.9 C)  TempSrc: Oral Oral Oral Oral  SpO2: 96% 95% 96% 95%  Weight:      Height:        General: Pt is alert, awake, not in acute distress Cardiovascular: Rate controlled, S1/S2 + Respiratory: Bilateral decreased breath sounds at bases with some scattered crackles abdominal: Soft, NT, ND, bowel sounds + Extremities: no edema, no cyanosis    The results of significant diagnostics from this hospitalization (including imaging, microbiology, ancillary and laboratory) are listed below for  reference.     Microbiology: Recent Results (from the past 240 hour(s))  Blood Culture (routine x 2)     Status: None (Preliminary result)   Collection Time: 09/09/17  3:34 PM  Result Value Ref Range Status   Specimen Description BLOOD RIGHT ANTECUBITAL  Final   Special Requests   Final    BOTTLES DRAWN AEROBIC AND ANAEROBIC Blood Culture adequate volume   Culture   Final    NO GROWTH < 24 HOURS Performed at [facility], [address] [place], [place] [address]    Report Status PENDING  Incomplete  Blood Culture (routine x 2)     Status: None (Preliminary result)   Collection Time: 09/09/17  3:39 PM  Result Value Ref Range Status   Specimen Description BLOOD RIGHT HAND  Final  Special Requests   Final    BOTTLES DRAWN AEROBIC AND ANAEROBIC Blood Culture results may not be optimal due to an inadequate volume of blood received in culture bottles   Culture   Final    NO GROWTH < 24 HOURS Performed at [facility] [address], [place], [place] [address]    Report Status PENDING  Incomplete     Labs: BNP (last 3 results) No results for input(s): BNP in the last 8760 hours. Basic Metabolic Panel: Recent Labs  Lab 09/09/17 1500 09/11/17 0458  NA 139 139  K 4.9 3.8  CL 107 110  CO2 23 20*  GLUCOSE 93 78  BUN 15 11  CREATININE 0.81 0.68  CALCIUM 8.4* 8.1*  MG  --  1.8   Liver Function Tests: Recent Labs  Lab 09/09/17 1500  AST 23  ALT 12*  ALKPHOS 59  BILITOT 1.0  PROT 6.2*  ALBUMIN 2.9*   No results for input(s): LIPASE, AMYLASE in the last 168 hours. No results for input(s): AMMONIA in the last 168 hours. CBC: Recent Labs  Lab 09/09/17 1500 09/11/17 0458  WBC 7.0 5.2  NEUTROABS 5.0 3.2  HGB 12.6 11.2*  HCT 38.8 34.6*  MCV 96.0 94.5  PLT 313 275   Cardiac Enzymes: No results for input(s): CKTOTAL, CKMB, CKMBINDEX, TROPONINI in the last 168 hours. BNP: Invalid input(s): POCBNP CBG: No results for input(s): GLUCAP in the last 168  hours. D-Dimer No results for input(s): DDIMER in the last 72 hours. Hgb A1c No results for input(s): HGBA1C in the last 72 hours. Lipid Profile No results for input(s): CHOL, HDL, LDLCALC, TRIG, CHOLHDL, LDLDIRECT in the last 72 hours. Thyroid function studies No results for input(s): TSH, T4TOTAL, T3FREE, THYROIDAB in the last 72 hours.  Invalid input(s): FREET3 Anemia work up No results for input(s): VITAMINB12, FOLATE, FERRITIN, TIBC, IRON, RETICCTPCT in the last 72 hours. Urinalysis    Component Value Date/Time   COLORURINE YELLOW 09/09/2017 1531   APPEARANCEUR CLOUDY (A) 09/09/2017 1531   APPEARANCEUR Hazy 06/11/2013 2307   LABSPEC 1.015 09/09/2017 1531   LABSPEC 1.017 06/11/2013 2307   PHURINE 5.0 09/09/2017 1531   GLUCOSEU NEGATIVE 09/09/2017 1531   GLUCOSEU Negative 06/11/2013 2307   HGBUR MODERATE (A) 09/09/2017 1531   BILIRUBINUR NEGATIVE 09/09/2017 1531   BILIRUBINUR Negative 06/11/2013 2307   KETONESUR NEGATIVE 09/09/2017 1531   PROTEINUR 100 (A) 09/09/2017 1531   UROBILINOGEN 0.2 05/16/2012 1209   NITRITE NEGATIVE 09/09/2017 1531   LEUKOCYTESUR NEGATIVE 09/09/2017 1531   LEUKOCYTESUR Negative 06/11/2013 2307   Sepsis Labs Invalid input(s): PROCALCITONIN,  WBC,  LACTICIDVEN Microbiology Recent Results (from the past 240 hour(s))  Blood Culture (routine x 2)     Status: None (Preliminary result)   Collection Time: 09/09/17  3:34 PM  Result Value Ref Range Status   Specimen Description BLOOD RIGHT ANTECUBITAL  Final   Special Requests   Final    BOTTLES DRAWN AEROBIC AND ANAEROBIC Blood Culture adequate volume   Culture   Final    NO GROWTH < 24 HOURS Performed at [facility], [place] [address] [place], [place] [address]    Report Status PENDING  Incomplete  Blood Culture (routine x 2)     Status: None (Preliminary result)   Collection Time: 09/09/17  3:39 PM  Result Value Ref Range Status   Specimen Description BLOOD RIGHT HAND  Final   Special  Requests   Final  BOTTLES DRAWN AEROBIC AND ANAEROBIC Blood Culture results may not be optimal due to an inadequate volume of blood received in culture bottles   Culture   Final    NO GROWTH < 24 HOURS Performed at [facility] [address], [place],  [address]    Report Status PENDING  Incomplete     Time coordinating discharge: 35 minutes  SIGNED:   Aline August, MD  [facility] 09/11/2017, 8:11 AM Pager: [phone]  If 7PM-7AM, please contact night-coverage [email or web address] Password [email or web address]

## 2017-09-11 NOTE — Progress Notes (Signed)
Pt to be discharged home today. Discharge instructions including Medications and schedules reviewed with Pt. Pt verbalized understanding of all discharge instructions including Medications and schedules. Discharge Packet with Pt at time of discharge

## 2017-09-11 NOTE — Telephone Encounter (Signed)
PLEASE NOTE: All timestamps contained within this report are represented as [place] Standard Time. CONFIDENTIALTY NOTICE: This fax transmission is intended only for the addressee. It contains information that is legally privileged, confidential or otherwise protected from use or disclosure. If you are not the intended recipient, you are strictly prohibited from reviewing, disclosing, copying using or disseminating any of this information or taking any action in reliance on or regarding this information. If you have received this fax in error, please notify us immediately by telephone so that we can arrange for its return to [place]. Phone: [phone], Toll-Free: [phone], Fax: [phone] Page: 1 of 2 Call Id: [id number] [place] Patient Name: Ariel Phillips Gender: Female DOB: 07-15-1932 Age: 84 Y 67 M 9 D Return Phone Number: [phone] (Primary), [phone] (Secondary) Address: City/State/Zip: [place] [address] Client [facility] - Client Client Site [place] Physician Tor Netters- NP Contact Type Call Who Is Calling Patient / Member / Family / Caregiver Call Type Triage / Clinical Relationship To Patient Self Return Phone Number [phone] (Primary) Chief Complaint Medication reaction Reason for Call Symptomatic / Request for [place] states she has a sore throat, as well as a cough,(sore throat occured after taking medication for cough). Translation No Nurse Assessment Nurse: Jimmye Norman, RN, [place] Date/Time (Eastern Time): 09/09/2017 11:04:06 AM Confirm and document reason for call. If symptomatic, describe symptoms. ---Caller states she has a sore throat, as well as a cough, (sore throat occurred after taking medication for cough). states on thursday she picked up the  prescription for the tesslon pearls, and the inhaler, has been on them for 2 days now. states that her throat this morning is so sore, she is not sure what to do. states she has had cold symptoms for 3 weeks now, and was seen in the office. denies fever. Does the patient have any new or worsening symptoms? ---Yes Will a triage be completed? ---Yes Related visit to physician within the last 2 weeks? ---Yes Does the PT have any chronic conditions? (i.e. diabetes, asthma, etc.) ---Yes List chronic conditions. ---IBS, allergies, asthma, hypothyroidism Is this a behavioral health or substance abuse call? ---No Guidelines Guideline Title Affirmed Question Affirmed Notes Nurse Date/Time (Eastern Time) Cough - Acute Productive Cough with cold symptoms (e.g., runny nose, postnasal drip, throat clearing) Jimmye Norman, RN, Whitney 09/09/2017 11:10:09 AM Sore Throat [1] Sore throat with cough/cold symptoms AND [2] present < 5 days Pricilla Handler, Loree Fee 09/09/2017 11:12:45 AM PLEASE NOTE: All timestamps contained within this report are represented as [place] Standard Time. CONFIDENTIALTY NOTICE: This fax transmission is intended only for the addressee. It contains information that is legally privileged, confidential or otherwise protected from use or disclosure. If you are not the intended recipient, you are strictly prohibited from reviewing, disclosing, copying using or disseminating any of this information or taking any action in reliance on or regarding this information. If you have received this fax in error, please notify us immediately by telephone so that we can arrange for its return to [place]. Phone: [phone], Toll-Free: [phone], Fax: [phone] Page: 2 of 2 Call Id: [id number] [place]. Time Eilene Ghazi Time) Disposition Final User 09/09/2017 11:04:48 AM Attempt made - no message left Artist Pais 09/09/2017 11:12:33 AM [place], RN, Loree Fee 09/09/2017 11:24:01 AM See  Physician within 24 Hours Yes Jimmye Norman, Therapist, sports, Loree Fee Disposition Overriden: [place]  Reason: Patient's symptoms need a higher level of care Caller Disagree/Comply Comply Caller Understands Yes PreDisposition InappropriateToAsk Care Advice Given Per Guideline HOME CARE: You should be able to treat this at home. CALL BACK IF: * You become worse. SEE PHYSICIAN WITHIN 24 HOURS: * IF OFFICE WILL BE OPEN: You need to be seen within the next 24 hours. Call your doctor when the office opens, and make an appointment. SEE PHYSICIAN WITHIN 24 HOURS: * IF OFFICE WILL BE OPEN: You need to be seen within the next 24 hours. Call your doctor when the office opens, and make an appointment. CALL BACK IF: * You become worse. IBUPROFEN (E.G., MOTRIN, ADVIL): Referrals [facility]

## 2017-09-11 NOTE — Telephone Encounter (Signed)
Admitted overnight with pneumonia Home on oral antibiotics Will have phone check and plan follow up in office next week

## 2017-09-11 NOTE — Telephone Encounter (Signed)
Per chart review tab pt was admitted to [facility] on 09/09/17.

## 2017-09-12 ENCOUNTER — Telehealth: Payer: Self-pay | Admitting: *Deleted

## 2017-09-12 NOTE — Telephone Encounter (Signed)
Transition Care Management Follow-up Telephone Call   Date discharged? 09/11/2017   How have you been since you were released from the hospital? "ok. im going out of town next week"   Do you understand why you were in the hospital? yes   Do you understand the discharge instructions? yes   Where were you discharged to? home   Items Reviewed:  Medications reviewed: yes  Allergies reviewed: yes  Dietary changes reviewed: yes  Referrals reviewed: yes   Functional Questionnaire:   Activities of Daily Living (ADLs):   She states they are independent in the following: independent in all areas   Any transportation issues/concerns?: no   Any patient concerns? no   Confirmed importance and date/time of follow-up visits scheduled yes  Provider Appointment booked with Dr Silvio Pate 5/29  Confirmed with patient if condition begins to worsen call PCP or go to the ER.  Patient was given the office number and encouraged to call back with question or concerns.  : yes

## 2017-09-14 LAB — CULTURE, BLOOD (ROUTINE X 2)
CULTURE: NO GROWTH
Culture: NO GROWTH
Special Requests: ADEQUATE

## 2017-09-17 DIAGNOSIS — R197 Diarrhea, unspecified: Secondary | ICD-10-CM | POA: Diagnosis not present

## 2017-09-18 DIAGNOSIS — R197 Diarrhea, unspecified: Secondary | ICD-10-CM | POA: Diagnosis not present

## 2017-09-21 ENCOUNTER — Encounter: Payer: Self-pay | Admitting: Emergency Medicine

## 2017-09-21 ENCOUNTER — Other Ambulatory Visit: Payer: Self-pay

## 2017-09-21 DIAGNOSIS — Z79899 Other long term (current) drug therapy: Secondary | ICD-10-CM | POA: Diagnosis not present

## 2017-09-21 DIAGNOSIS — Z91040 Latex allergy status: Secondary | ICD-10-CM | POA: Diagnosis not present

## 2017-09-21 DIAGNOSIS — K625 Hemorrhage of anus and rectum: Secondary | ICD-10-CM | POA: Diagnosis present

## 2017-09-21 DIAGNOSIS — J45909 Unspecified asthma, uncomplicated: Secondary | ICD-10-CM | POA: Diagnosis not present

## 2017-09-21 DIAGNOSIS — K922 Gastrointestinal hemorrhage, unspecified: Secondary | ICD-10-CM | POA: Diagnosis not present

## 2017-09-21 DIAGNOSIS — E039 Hypothyroidism, unspecified: Secondary | ICD-10-CM | POA: Insufficient documentation

## 2017-09-21 NOTE — ED Triage Notes (Signed)
Pt reports over the past few days her stools have gotten darker and now she thinks they are black. Pt denies pain. Reports called [facility] and was told to come to the ED. Pt denies all other symptoms and reports she is not even sure they are black.

## 2017-09-22 ENCOUNTER — Telehealth: Payer: Self-pay

## 2017-09-22 ENCOUNTER — Other Ambulatory Visit: Payer: Self-pay | Admitting: Internal Medicine

## 2017-09-22 ENCOUNTER — Emergency Department
Admission: EM | Admit: 2017-09-22 | Discharge: 2017-09-22 | Disposition: A | Discharge status: Home / Self Care | Payer: Medicare Other | Attending: Emergency Medicine | Admitting: Emergency Medicine

## 2017-09-22 DIAGNOSIS — K922 Gastrointestinal hemorrhage, unspecified: Secondary | ICD-10-CM

## 2017-09-22 LAB — CBC
HEMATOCRIT: 39.5 % (ref 35.0–47.0)
HEMOGLOBIN: 13.4 g/dL (ref 12.0–16.0)
MCH: 31.7 pg (ref 26.0–34.0)
MCHC: 34 g/dL (ref 32.0–36.0)
MCV: 93.3 fL (ref 80.0–100.0)
Platelets: 349 10*3/uL (ref 150–440)
RBC: 4.23 MIL/uL (ref 3.80–5.20)
RDW: 13.5 % (ref 11.5–14.5)
WBC: 5.5 10*3/uL (ref 3.6–11.0)

## 2017-09-22 LAB — COMPREHENSIVE METABOLIC PANEL
ALBUMIN: 3.8 g/dL (ref 3.5–5.0)
ALT: 13 U/L — ABNORMAL LOW (ref 14–54)
ANION GAP: 6 (ref 5–15)
AST: 25 U/L (ref 15–41)
Alkaline Phosphatase: 68 U/L (ref 38–126)
BILIRUBIN TOTAL: 0.4 mg/dL (ref 0.3–1.2)
BUN: 18 mg/dL (ref 6–20)
CALCIUM: 8.9 mg/dL (ref 8.9–10.3)
CHLORIDE: 106 mmol/L (ref 101–111)
CO2: 26 mmol/L (ref 22–32)
Creatinine, Ser: 0.63 mg/dL (ref 0.44–1.00)
Glucose, Bld: 95 mg/dL (ref 65–99)
POTASSIUM: 4.2 mmol/L (ref 3.5–5.1)
Sodium: 138 mmol/L (ref 135–145)
Total Protein: 7 g/dL (ref 6.5–8.1)

## 2017-09-22 MED ORDER — PANTOPRAZOLE SODIUM 40 MG PO TBEC: 40.0000 mg | DELAYED_RELEASE_TABLET | Freq: Every day | ORAL | 0 refills | Status: DC

## 2017-09-22 NOTE — Telephone Encounter (Signed)
Spoke to pt. She said she was advised it possibly was due to the Pepto she was taking. But advised her to see GI. She has an appt with [place] next week.

## 2017-09-22 NOTE — Telephone Encounter (Signed)
Please check on her

## 2017-09-22 NOTE — ED Notes (Signed)
Patient c/o diarrhea. Patient reports that stools started as a red/orange and slowly turning to dark brown. Patient denies pain, dizziness, and lightheadedness.

## 2017-09-22 NOTE — ED Notes (Signed)
Reviewed discharge instructions, follow-up care, and prescriptions with patient. Patient verbalized understanding of all information reviewed. Patient stable, with no distress noted at this time.    

## 2017-09-22 NOTE — Telephone Encounter (Signed)
PLEASE NOTE: All timestamps contained within this report are represented as [place] Standard Time. CONFIDENTIALTY NOTICE: This fax transmission is intended only for the addressee. It contains information that is legally privileged, confidential or otherwise protected from use or disclosure. If you are not the intended recipient, you are strictly prohibited from reviewing, disclosing, copying using or disseminating any of this information or taking any action in reliance on or regarding this information. If you have received this fax in error, please notify us immediately by telephone so that we can arrange for its return to [place]. Phone: [phone], Toll-Free: [phone], Fax: [phone] Page: 1 of 2 Call Id: [id number] [place] Patient Name: Ariel Phillips Gender: Female DOB: 1932/05/18 Age: 84 Y 1 M 22 D Return Phone Number: [phone] (Secondary) Address: City/State/Zip: [place] [address] Client [facility] - Client Client Site [place] Physician [name] - MD Contact Type Call Who Is Calling Patient / Member / Family / Caregiver Call Type Triage / Clinical Relationship To Patient Self Return Phone Number [phone] (Secondary) Chief Complaint Stool - Unusual Color Reason for Call Symptomatic / Request for [place] states that her stool is really dark due to her Rx Levaquin. She was also given a lot of antibiotics while she was in the hospital, she was discharged on May 13th. Translation No Nurse Assessment Nurse: Toribio Harbour, RN, Joelene Millin Date/Time (Eastern Time): 09/21/2017 11:13:17 PM Confirm and document reason for call. If symptomatic, describe symptoms. ---Caller states her stool is black. Does the patient have any new or worsening symptoms? ---Yes Will a triage be completed?  ---Yes Related visit to physician within the last 2 weeks? ---No Does the PT have any chronic conditions? (i.e. diabetes, asthma, etc.) ---Yes List chronic conditions. ---IBS, seasonal allergies, hypothyroidism Is this a behavioral health or substance abuse call? ---No Guidelines Guideline Title Affirmed Question Affirmed Notes Nurse Date/Time Eilene Ghazi Time) Rectal Bleeding Tarry or jet black-colored stool (not dark green) [place], RN, Joelene Millin 09/21/2017 11:16:07 PM Disp. Time Eilene Ghazi Time) Disposition Final User 09/21/2017 11:18:46 PM Go to ED Now Yes Toribio Harbour, RN, Renea Ee Disagree/Comply Comply Caller Understands Yes PreDisposition Did not know what to do PLEASE NOTE: All timestamps contained within this report are represented as [place] Standard Time. CONFIDENTIALTY NOTICE: This fax transmission is intended only for the addressee. It contains information that is legally privileged, confidential or otherwise protected from use or disclosure. If you are not the intended recipient, you are strictly prohibited from reviewing, disclosing, copying using or disseminating any of this information or taking any action in reliance on or regarding this information. If you have received this fax in error, please notify us immediately by telephone so that we can arrange for its return to [place]. Phone: [phone], Toll-Free: [phone], Fax: [phone] Page: 2 of 2 Call Id: [id number] Care Advice Given Per Guideline GO TO ED NOW: You need to be seen in the Emergency Department. Go to the ER at ___________ [place] now. Drive carefully. BRING MEDICINES: * Please bring a list of your current medicines when you go to the Emergency Department (ER). * It is also a good idea to bring the pill bottles too. This will help the doctor to make certain you are taking the right medicines and the right dose. CARE ADVICE given per Rectal Bleeding (Adult) guideline. Referrals [facility] - ED  [facility] - ED

## 2017-09-22 NOTE — ED Provider Notes (Signed)
[facility] Provider Note  ____________________________________________   First MD Initiated Contact with Patient 09/22/17 0045     (approximate)  I have reviewed the triage vital signs and the nursing notes.   HISTORY  Chief Complaint Rectal Bleeding   HPI Ariel Phillips is a 84 y.o. female who self presents to the emergency department with 1 day of black stools.  She called her primary care physician who advised her to come to the emergency department out of concern this could represent a GI bleed.  The patient has no history of gastric disease and is never had endoscopy.  She does not take aspirin Plavix or any blood thinning medication.  She rarely drinks alcohol.  She only occasionally takes nonsteroidals.  She has been using Pepto-Bismol nearly every day for the past week or so however her last dose was around 2 days ago.  She has no known history of GI bleed.  She has no abdominal pain.  No chest pain or shortness of breath.  The symptoms are intermittent.  Become when she defecates.  Are improved but not defecating.  Past Medical History:  Diagnosis Date  . Allergic asthma   . Allergic rhinitis due to pollen   . Allergy   . GERD (gastroesophageal reflux disease)   . IBS (irritable bowel syndrome)   . Osteopenia   . Unspecified hypothyroidism     Patient Active Problem List   Diagnosis Date Noted  . Sepsis ([place]) 09/09/2017  . PNA (pneumonia) 09/09/2017  . Allergic rhinitis 08/22/2017  . Mood disorder ([place]) 12/30/2016  . Memory loss 12/02/2015  . Benign essential tremor 07/20/2015  . Advanced directives, counseling/discussion 12/19/2013  . Routine general medical examination at a health care facility 12/13/2012  . Hypothyroidism 12/12/2011  . Allergic rhinitis due to pollen   . Allergic asthma   . GERD (gastroesophageal reflux disease)   . IBS (irritable bowel syndrome)   . Osteopenia     Past Surgical History:    Procedure Laterality Date  . ANAL FISSURE REPAIR  1950's  . BASAL CELL CARCINOMA EXCISION  2000's   nose  . CHOLECYSTECTOMY    . COLONOSCOPY  Multiple   Negative screening exams  . [place] or so   with fissure repair  . TUBAL LIGATION      Prior to Admission medications   Medication Sig Start Date End Date Taking? Authorizing Provider  albuterol (PROAIR HFA) 108 (90 Base) MCG/ACT inhaler Inhale 2 puffs into the lungs every 6 (six) hours as needed for wheezing or shortness of breath. 09/11/17   Aline August, MD  benzonatate (TESSALON) 100 MG capsule Take 1-2 capsules (100-200 mg total) by mouth 3 (three) times daily as needed. Patient taking differently: Take 100-200 mg by mouth 3 (three) times daily as needed for cough.  09/06/17   Elby Beck, FNP  carboxymethylcellulose (REFRESH PLUS) 0.5 % SOLN Place 2 drops into both eyes daily as needed (dry eyes).     [provider]  EPINEPHrine 0.3 mg/0.3 mL IJ SOAJ injection Inject 0.3 mg into the muscle once. Use prn, takes allergy shots and has this on hand for adverse reactions    [provider]  ibuprofen (ADVIL,MOTRIN) 200 MG tablet Take 200 mg by mouth every 6 (six) hours as needed for fever or moderate pain.    [provider]  levothyroxine (SYNTHROID, LEVOTHROID) 75 MCG tablet Take 1 tablet (75 mcg total) by mouth  daily before breakfast. 12/29/16   Venia Carbon, MD  loratadine (CLARITIN) 10 MG tablet Take 10 mg by mouth every evening.     [provider]  pantoprazole (PROTONIX) 40 MG tablet Take 1 tablet (40 mg total) by mouth daily. 09/22/17 09/22/18  Darel Hong, MD  Throat Lozenges (LOZENGES MT) Use as directed 1 lozenge in the mouth or throat every 2 (two) hours as needed (cough).    [provider]    Allergies Penicillins and Latex  Family History  Problem Relation Age of Onset  . COPD Mother   . Cancer Mother        unclear diagnosis  . Transient  ischemic attack Mother   . Diabetes Sister   . Hypertension Sister   . Stroke Other   . Cancer Paternal Grandmother        ?breast  . Heart disease Neg Hx     Social History Social History   Tobacco Use  . Smoking status: Never Smoker  . Smokeless tobacco: Never Used  Substance Use Topics  . Alcohol use: Yes    Comment: rare wine  . Drug use: No    Review of Systems Constitutional: No fever/chills Eyes: No visual changes. ENT: No sore throat. Cardiovascular: Denies chest pain. Respiratory: Denies shortness of breath. Gastrointestinal: No abdominal pain.  No nausea, no vomiting.  No diarrhea.  No constipation. Genitourinary: Negative for dysuria. Musculoskeletal: Negative for back pain. Skin: Negative for rash. Neurological: Negative for headaches, focal weakness or numbness.   ____________________________________________   PHYSICAL EXAM:  VITAL SIGNS: ED Triage Vitals  Enc Vitals Group     BP 09/21/17 2343 (!) 142/72     Pulse Rate 09/21/17 2343 83     Resp 09/21/17 2343 18     Temp 09/21/17 2343 98 F (36.7 C)     Temp Source 09/21/17 2343 Oral     SpO2 09/21/17 2343 98 %     Weight 09/21/17 2342 134 lb (60.8 kg)     Height 09/21/17 2342 5\' 3"  (1.6 m)     Head Circumference --      Peak Flow --      Pain Score 09/21/17 2339 0     Pain Loc --      Pain Edu? --      Excl. in [place]? --     Constitutional: Alert and oriented x4 pleasant cooperative speaks in sentences no diaphoresis Eyes: PERRL EOMI. Head: Atraumatic. Nose: No congestion/rhinnorhea. Mouth/Throat: No trismus Neck: No stridor.   Cardiovascular: Normal rate, regular rhythm. Grossly normal heart sounds.  Good peripheral circulation. Respiratory: Normal respiratory effort.  No retractions. Lungs CTAB and moving good air Gastrointestinal: Soft nondistended nontender no rebound or guarding no peritonitis Musculoskeletal: No lower extremity edema   Neurologic:  Normal speech and language. No  gross focal neurologic deficits are appreciated. Skin:  Skin is warm, dry and intact. No rash noted. Psychiatric: Mood and affect are normal. Speech and behavior are normal.    ____________________________________________   DIFFERENTIAL includes but not limited to  Upper GI bleed, lower GI bleed, Pepto-Bismol exposure, gastritis ____________________________________________   LABS (all labs ordered are listed, but only abnormal results are displayed)  Labs Reviewed  COMPREHENSIVE METABOLIC PANEL - Abnormal; Notable for the following components:      Result Value   ALT 13 (*)    All other components within normal limits  CBC  TYPE AND SCREEN    Lab work reviewed by  me with no signs of anemia __________________________________________  EKG   ____________________________________________  RADIOLOGY   ____________________________________________   PROCEDURES  Procedure(s) performed: no  Procedures  Critical Care performed: no  Observation: no ____________________________________________   INITIAL IMPRESSION / ASSESSMENT AND PLAN / ED COURSE  Pertinent labs & imaging results that were available during my care of the patient were reviewed by me and considered in my medical decision making (see chart for details).      ----------------------------------------- 12:53 AM on 09/22/2017 -----------------------------------------  Patient arrives hemodynamically stable and very well-appearing.  She does not take any aspirin Plavix blood thinning medication.  She only occasionally uses nonsteroidals.  No alcohol use.  She does use Pepto-Bismol frequently.  Unclear if this is secondary to Pepto-Bismol use or a mild upper GI bleed.  Regardless her hemoglobin is stable and she already has a gastroenterologist in [place]. ____________________________________________   FINAL CLINICAL IMPRESSION(S) / ED DIAGNOSES  Final diagnoses:  Gastrointestinal hemorrhage,  unspecified gastrointestinal hemorrhage type      NEW MEDICATIONS STARTED DURING THIS VISIT:  Discharge Medication List as of 09/22/2017 12:53 AM    START taking these medications   Details  pantoprazole (PROTONIX) 40 MG tablet Take 1 tablet (40 mg total) by mouth daily., Starting Fri 09/22/2017, Until Sat 09/22/2018, Print         Note:  This document was prepared using Dragon voice recognition software and may include unintentional dictation errors.     Darel Hong, MD 09/22/17 [phone]

## 2017-09-22 NOTE — Telephone Encounter (Signed)
Per chart review tab pt went to [facility] ED 09/22/17.

## 2017-09-22 NOTE — Discharge Instructions (Signed)
It was a pleasure to take care of you today, and thank you for coming to our emergency department.  If you have any questions or concerns before leaving please ask the nurse to grab me and I'm more than happy to go through your aftercare instructions again.  If you were prescribed any opioid pain medication today such as Norco, Vicodin, Percocet, morphine, hydrocodone, or oxycodone please make sure you do not drive when you are taking this medication as it can alter your ability to drive safely.  If you have any concerns once you are home that you are not improving or are in fact getting worse before you can make it to your follow-up appointment, please do not hesitate to call 911 and come back for further evaluation.  Darel Hong, MD  Results for orders placed or performed during the hospital encounter of 09/22/17  Comprehensive metabolic panel  Result Value Ref Range   Sodium 138 135 - 145 mmol/L   Potassium 4.2 3.5 - 5.1 mmol/L   Chloride 106 101 - 111 mmol/L   CO2 26 22 - 32 mmol/L   Glucose, Bld 95 65 - 99 mg/dL   BUN 18 6 - 20 mg/dL   Creatinine, Ser 0.63 0.44 - 1.00 mg/dL   Calcium 8.9 8.9 - 10.3 mg/dL   Total Protein 7.0 6.5 - 8.1 g/dL   Albumin 3.8 3.5 - 5.0 g/dL   AST 25 15 - 41 U/L   ALT 13 (L) 14 - 54 U/L   Alkaline Phosphatase 68 38 - 126 U/L   Total Bilirubin 0.4 0.3 - 1.2 mg/dL   GFR calc non Af Amer >60 >60 mL/min   GFR calc Af Amer >60 >60 mL/min   Anion gap 6 5 - 15  CBC  Result Value Ref Range   WBC 5.5 3.6 - 11.0 K/uL   RBC 4.23 3.80 - 5.20 MIL/uL   Hemoglobin 13.4 12.0 - 16.0 g/dL   HCT 39.5 35.0 - 47.0 %   MCV 93.3 80.0 - 100.0 fL   MCH 31.7 26.0 - 34.0 pg   MCHC 34.0 32.0 - 36.0 g/dL   RDW 13.5 11.5 - 14.5 %   Platelets 349 150 - 440 K/uL  Type and screen [place]  Result Value Ref Range   ABO/RH(D) PENDING    Antibody Screen PENDING    Sample Expiration      09/24/2017 Performed at [facility], [address] [place], [place] [address]    Dg Chest 2 View  Result Date: 09/09/2017 CLINICAL DATA:  Two weeks of persistent cough worse at night, history GERD, nonsmoker, allergic asthma EXAM: CHEST - 2 VIEW COMPARISON:  06/11/2013 FINDINGS: Normal heart size, mediastinal contours, and pulmonary vascularity. Azygos fissure noted. LEFT lower lobe infiltrate consistent with pneumonia. Remaining lungs appear mildly hyperinflated but clear. No pleural effusion or pneumothorax. Bones demineralized. IMPRESSION: LEFT lower lobe infiltrate consistent with pneumonia. Electronically Signed   By: Lavonia Dana M.D.   On: 09/09/2017 15:29

## 2017-09-27 ENCOUNTER — Encounter: Payer: Self-pay | Admitting: Internal Medicine

## 2017-09-27 ENCOUNTER — Ambulatory Visit (INDEPENDENT_AMBULATORY_CARE_PROVIDER_SITE_OTHER): Payer: Medicare Other | Admitting: Internal Medicine

## 2017-09-27 VITALS — BP 136/76 | HR 84 | Resp 20

## 2017-09-27 DIAGNOSIS — K921 Melena: Secondary | ICD-10-CM

## 2017-09-27 DIAGNOSIS — J181 Lobar pneumonia, unspecified organism: Secondary | ICD-10-CM | POA: Diagnosis not present

## 2017-09-27 DIAGNOSIS — J301 Allergic rhinitis due to pollen: Secondary | ICD-10-CM | POA: Diagnosis not present

## 2017-09-27 NOTE — Assessment & Plan Note (Signed)
Seems to be clinically better Reviewed hospital records Still mild fatigue--should improve Will check CXR in 2-3 weeks

## 2017-09-27 NOTE — Assessment & Plan Note (Signed)
No clear evidence of GI bleed Okay to cancel referral Can stop the protonix

## 2017-09-27 NOTE — Progress Notes (Signed)
Subjective:    Patient ID: Ariel Phillips, female    DOB: 1932/12/07, 84 y.o.   MRN: [id number]  HPI Here for hospital follow up --and ER follow up  Had gone from [facility] with cough and feeling bad-- then sent to ER at [facility] Diagnosed with pneumonia Admitted for 2 days--sent home with levaquin Done with antibiotic Cough is better. No fever or SOB Really liked the tessalon  Still doesn't feel "right" No energy Essential tremor seems worse  Went back to ER for black stool May have been from pepto bismol Not excited about having EGD Started on protonix--but not happy about it. No heartburn history Black stool is gone  Current Outpatient Medications on File Prior to Visit  Medication Sig Dispense Refill  . albuterol (PROAIR HFA) 108 (90 Base) MCG/ACT inhaler Inhale 2 puffs into the lungs every 6 (six) hours as needed for wheezing or shortness of breath.    . carboxymethylcellulose (REFRESH PLUS) 0.5 % SOLN Place 2 drops into both eyes daily as needed (dry eyes).     Marland Kitchen EPINEPHrine 0.3 mg/0.3 mL IJ SOAJ injection Inject 0.3 mg into the muscle once. Use prn, takes allergy shots and has this on hand for adverse reactions    . ibuprofen (ADVIL,MOTRIN) 200 MG tablet Take 200 mg by mouth every 6 (six) hours as needed for fever or moderate pain.    Marland Kitchen levothyroxine (SYNTHROID, LEVOTHROID) 75 MCG tablet TAKE ONE TABLET DAILY BEFORE BREAKFAST 90 tablet 3  . loratadine (CLARITIN) 10 MG tablet Take 10 mg by mouth every evening.     . pantoprazole (PROTONIX) 40 MG tablet Take 1 tablet (40 mg total) by mouth daily. 30 tablet 0   No current facility-administered medications on file prior to visit.     Allergies  Allergen Reactions  . Penicillins Diarrhea  . Latex Rash    Questionable allergy per patient, rash developed at site of latex band-aid placed after toenail removal    Past Medical History:  Diagnosis Date  . Allergic asthma   . Allergic rhinitis due to pollen   .  Allergy   . GERD (gastroesophageal reflux disease)   . IBS (irritable bowel syndrome)   . Osteopenia   . Unspecified hypothyroidism     Past Surgical History:  Procedure Laterality Date  . ANAL FISSURE REPAIR  1950's  . BASAL CELL CARCINOMA EXCISION  2000's   nose  . CHOLECYSTECTOMY    . COLONOSCOPY  Multiple   Negative screening exams  . [place] or so   with fissure repair  . TUBAL LIGATION      Family History  Problem Relation Age of Onset  . COPD Mother   . Cancer Mother        unclear diagnosis  . Transient ischemic attack Mother   . Diabetes Sister   . Hypertension Sister   . Stroke Other   . Cancer Paternal Grandmother        ?breast  . Heart disease Neg Hx     Social History   Socioeconomic History  . Marital status: Divorced    Spouse name: Not on file  . Number of children: 1  . Years of education: Not on file  . Highest education level: Not on file  Occupational History  . Occupation: Retired Personnel officer  Social Needs  . Financial resource strain: Not on file  . Food insecurity:    Worry: Not on file    Inability:  Not on file  . Transportation needs:    Medical: Not on file    Non-medical: Not on file  Tobacco Use  . Smoking status: Never Smoker  . Smokeless tobacco: Never Used  Substance and Sexual Activity  . Alcohol use: Yes    Comment: rare wine  . Drug use: No  . Sexual activity: Not on file  Lifestyle  . Physical activity:    Days per week: Not on file    Minutes per session: Not on file  . Stress: Not on file  Relationships  . Social connections:    Talks on phone: Not on file    Gets together: Not on file    Attends religious service: Not on file    Active member of club or organization: Not on file    Attends meetings of clubs or organizations: Not on file    Relationship status: Not on file  . Intimate partner violence:    Fear of current or ex partner: Not on file    Emotionally abused: Not on  file    Physically abused: Not on file    Forced sexual activity: Not on file  Other Topics Concern  . Not on file  Social History Narrative   Divorced and retired , moved from [place], [place]   1 Daughter in [place]   Has living will   Daughter has health care POA.---[name]   Requests DNR--order done 06/19/12   Would not want prolonged mechanical ventilation   May accept tube feeds temporarily but not if cognitively unaware   Review of Systems  Appetite is improved--but had lost sense of taste Sleeping okay---and increased in daytime     Objective:   Physical Exam  Constitutional: She appears well-developed. No distress.  Neck: No thyromegaly present.  Cardiovascular: Normal rate, regular rhythm and normal heart sounds. Exam reveals no gallop.  No murmur heard. Respiratory: Effort normal and breath sounds normal. No respiratory distress. She has no wheezes. She has no rales.  No dullness  GI: Soft. She exhibits no distension. There is no tenderness. There is no rebound and no guarding.  Musculoskeletal: She exhibits no edema.  Lymphadenopathy:    She has no cervical adenopathy.           Assessment & Plan:

## 2017-09-29 ENCOUNTER — Ambulatory Visit: Payer: Self-pay | Admitting: Physician Assistant

## 2017-10-02 DIAGNOSIS — J301 Allergic rhinitis due to pollen: Secondary | ICD-10-CM | POA: Diagnosis not present

## 2017-10-05 DIAGNOSIS — H2513 Age-related nuclear cataract, bilateral: Secondary | ICD-10-CM | POA: Diagnosis not present

## 2017-10-10 ENCOUNTER — Ambulatory Visit (INDEPENDENT_AMBULATORY_CARE_PROVIDER_SITE_OTHER)
Admission: RE | Admit: 2017-10-10 | Discharge: 2017-10-10 | Disposition: A | Discharge status: Home / Self Care | Payer: Medicare Other | Source: Ambulatory Visit | Attending: Internal Medicine | Admitting: Internal Medicine

## 2017-10-10 DIAGNOSIS — J181 Lobar pneumonia, unspecified organism: Secondary | ICD-10-CM

## 2017-10-10 DIAGNOSIS — J189 Pneumonia, unspecified organism: Secondary | ICD-10-CM | POA: Diagnosis not present

## 2017-10-12 ENCOUNTER — Encounter: Payer: Self-pay | Admitting: Internal Medicine

## 2017-10-12 NOTE — Telephone Encounter (Signed)
Can you please give her the needed information. Nice to have a compliment like this!!

## 2017-10-16 DIAGNOSIS — J301 Allergic rhinitis due to pollen: Secondary | ICD-10-CM | POA: Diagnosis not present

## 2017-10-23 DIAGNOSIS — L578 Other skin changes due to chronic exposure to nonionizing radiation: Secondary | ICD-10-CM | POA: Diagnosis not present

## 2017-10-23 DIAGNOSIS — C44311 Basal cell carcinoma of skin of nose: Secondary | ICD-10-CM | POA: Diagnosis not present

## 2017-10-23 DIAGNOSIS — Z85828 Personal history of other malignant neoplasm of skin: Secondary | ICD-10-CM | POA: Diagnosis not present

## 2017-10-30 DIAGNOSIS — J301 Allergic rhinitis due to pollen: Secondary | ICD-10-CM | POA: Diagnosis not present

## 2017-11-03 DIAGNOSIS — J301 Allergic rhinitis due to pollen: Secondary | ICD-10-CM | POA: Diagnosis not present

## 2017-11-06 DIAGNOSIS — J301 Allergic rhinitis due to pollen: Secondary | ICD-10-CM | POA: Diagnosis not present

## 2017-11-09 DIAGNOSIS — H6123 Impacted cerumen, bilateral: Secondary | ICD-10-CM | POA: Diagnosis not present

## 2017-11-09 DIAGNOSIS — J309 Allergic rhinitis, unspecified: Secondary | ICD-10-CM | POA: Diagnosis not present

## 2017-11-20 DIAGNOSIS — J301 Allergic rhinitis due to pollen: Secondary | ICD-10-CM | POA: Diagnosis not present

## 2017-11-27 DIAGNOSIS — J301 Allergic rhinitis due to pollen: Secondary | ICD-10-CM | POA: Diagnosis not present

## 2017-12-04 DIAGNOSIS — J301 Allergic rhinitis due to pollen: Secondary | ICD-10-CM | POA: Diagnosis not present

## 2017-12-11 DIAGNOSIS — J301 Allergic rhinitis due to pollen: Secondary | ICD-10-CM | POA: Diagnosis not present

## 2017-12-18 DIAGNOSIS — J301 Allergic rhinitis due to pollen: Secondary | ICD-10-CM | POA: Diagnosis not present

## 2017-12-21 DIAGNOSIS — Z85828 Personal history of other malignant neoplasm of skin: Secondary | ICD-10-CM | POA: Diagnosis not present

## 2017-12-25 DIAGNOSIS — J301 Allergic rhinitis due to pollen: Secondary | ICD-10-CM | POA: Diagnosis not present

## 2018-01-08 DIAGNOSIS — J301 Allergic rhinitis due to pollen: Secondary | ICD-10-CM | POA: Diagnosis not present

## 2018-01-09 DIAGNOSIS — G43109 Migraine with aura, not intractable, without status migrainosus: Secondary | ICD-10-CM | POA: Diagnosis not present

## 2018-01-12 ENCOUNTER — Encounter: Payer: Self-pay | Admitting: Internal Medicine

## 2018-01-12 ENCOUNTER — Ambulatory Visit (INDEPENDENT_AMBULATORY_CARE_PROVIDER_SITE_OTHER): Payer: Medicare Other | Admitting: Internal Medicine

## 2018-01-12 VITALS — BP 118/70 | HR 68 | Temp 97.3°F | Ht 63.5 in | Wt 136.0 lb

## 2018-01-12 DIAGNOSIS — G250 Essential tremor: Secondary | ICD-10-CM | POA: Diagnosis not present

## 2018-01-12 DIAGNOSIS — J301 Allergic rhinitis due to pollen: Secondary | ICD-10-CM | POA: Diagnosis not present

## 2018-01-12 DIAGNOSIS — R413 Other amnesia: Secondary | ICD-10-CM | POA: Diagnosis not present

## 2018-01-12 DIAGNOSIS — E039 Hypothyroidism, unspecified: Secondary | ICD-10-CM

## 2018-01-12 DIAGNOSIS — Z7189 Other specified counseling: Secondary | ICD-10-CM | POA: Diagnosis not present

## 2018-01-12 DIAGNOSIS — F39 Unspecified mood [affective] disorder: Secondary | ICD-10-CM | POA: Diagnosis not present

## 2018-01-12 DIAGNOSIS — K589 Irritable bowel syndrome without diarrhea: Secondary | ICD-10-CM | POA: Diagnosis not present

## 2018-01-12 DIAGNOSIS — Z0000 Encounter for general adult medical examination without abnormal findings: Secondary | ICD-10-CM | POA: Diagnosis not present

## 2018-01-12 LAB — T4, FREE: FREE T4: 0.95 ng/dL (ref 0.60–1.60)

## 2018-01-12 LAB — TSH: TSH: 2.69 u[IU]/mL (ref 0.35–4.50)

## 2018-01-12 NOTE — Assessment & Plan Note (Signed)
Persistent but stable Recent normal cognitive testing may reflect her educational level--but is still reassuring

## 2018-01-12 NOTE — Assessment & Plan Note (Signed)
Mild --no Rx

## 2018-01-12 NOTE — Assessment & Plan Note (Signed)
I have personally reviewed the Medicare Annual Wellness questionnaire and have noted 1. The patient's medical and social history 2. Their use of alcohol, tobacco or illicit drugs 3. Their current medications and supplements 4. The patient's functional ability including ADL's, fall risks, home safety risks and hearing or visual             impairment. 5. Diet and physical activities 6. Evidence for depression or mood disorders  The patients weight, height, BMI and visual acuity have been recorded in the chart I have made referrals, counseling and provided education to the patient based review of the above and I have provided the pt with a written personalized care plan for preventive services.  I have provided you with a copy of your personalized plan for preventive services. Please take the time to review along with your updated medication list.  Will get flu vaccine at [facility] No cancer screening Discussed fitness, etc

## 2018-01-12 NOTE — Assessment & Plan Note (Signed)
Mild symptoms but no persistent depressed mood

## 2018-01-12 NOTE — Progress Notes (Signed)
Subjective:    Patient ID: Ariel Phillips, female    DOB: 1933/03/30, 84 y.o.   MRN: [id number]  HPI Here for Medicare wellness visit and follow up of chronic medical conditions Reviewed form and advanced directives Reviewed other doctors Occasional alcohol No tobacco Tries to exercise regularly--but feels "I have low energy" Hearing aides Vision is fine Independent with instrumental ADLs No falls  Still has some depressed mood (sort of) Thinks this may be affecting her energy No longer seeing counselor  Feels over the pneumonia No cough or fever No breathing problems  No recurrence of black stools Bowels are fine  Did sign up for long term dementia study [facility] She notices a problem Told she is normal after extensive cognitive battery of tests  Continues on allergy immunotherapy No asthma symptoms On allegra--satisfied with this  Chronic IBS Eats gluten free and no lactose Keeps to avoidance diet No blood  Current Outpatient Medications on File Prior to Visit  Medication Sig Dispense Refill  . albuterol (PROAIR HFA) 108 (90 Base) MCG/ACT inhaler Inhale 2 puffs into the lungs every 6 (six) hours as needed for wheezing or shortness of breath.    . carboxymethylcellulose (REFRESH PLUS) 0.5 % SOLN Place 2 drops into both eyes daily as needed (dry eyes).     Marland Kitchen EPINEPHrine 0.3 mg/0.3 mL IJ SOAJ injection Inject 0.3 mg into the muscle once. Use prn, takes allergy shots and has this on hand for adverse reactions    . fexofenadine (ALLEGRA) 180 MG tablet Take 180 mg by mouth daily.    Marland Kitchen levothyroxine (SYNTHROID, LEVOTHROID) 75 MCG tablet TAKE ONE TABLET DAILY BEFORE BREAKFAST 90 tablet 3   No current facility-administered medications on file prior to visit.     Allergies  Allergen Reactions  . Penicillins Diarrhea  . Latex Rash    Questionable allergy per patient, rash developed at site of latex band-aid placed after toenail removal    Past Medical  History:  Diagnosis Date  . Allergic asthma   . Allergic rhinitis due to pollen   . Allergy   . GERD (gastroesophageal reflux disease)   . IBS (irritable bowel syndrome)   . Osteopenia   . Unspecified hypothyroidism     Past Surgical History:  Procedure Laterality Date  . ANAL FISSURE REPAIR  1950's  . BASAL CELL CARCINOMA EXCISION  2000's   nose  . CHOLECYSTECTOMY    . COLONOSCOPY  Multiple   Negative screening exams  . [place] or so   with fissure repair  . TUBAL LIGATION      Family History  Problem Relation Age of Onset  . COPD Mother   . Cancer Mother        unclear diagnosis  . Transient ischemic attack Mother   . Diabetes Sister   . Hypertension Sister   . Stroke Other   . Cancer Paternal Grandmother        ?breast  . Heart disease Neg Hx     Social History   Socioeconomic History  . Marital status: Divorced    Spouse name: Not on file  . Number of children: 1  . Years of education: Not on file  . Highest education level: Not on file  Occupational History  . Occupation: Retired Personnel officer  Social Needs  . Financial resource strain: Not on file  . Food insecurity:    Worry: Not on file    Inability: Not on file  .  Transportation needs:    Medical: Not on file    Non-medical: Not on file  Tobacco Use  . Smoking status: Never Smoker  . Smokeless tobacco: Never Used  Substance and Sexual Activity  . Alcohol use: Yes    Comment: rare wine  . Drug use: No  . Sexual activity: Not on file  Lifestyle  . Physical activity:    Days per week: Not on file    Minutes per session: Not on file  . Stress: Not on file  Relationships  . Social connections:    Talks on phone: Not on file    Gets together: Not on file    Attends religious service: Not on file    Active member of club or organization: Not on file    Attends meetings of clubs or organizations: Not on file    Relationship status: Not on file  . Intimate partner  violence:    Fear of current or ex partner: Not on file    Emotionally abused: Not on file    Physically abused: Not on file    Forced sexual activity: Not on file  Other Topics Concern  . Not on file  Social History Narrative   Divorced and retired , moved from [place], [place]   1 Daughter in [place]   Has living will   Daughter has health care POA.---Ariel Phillips   Requests DNR--order done 06/19/12   Would not want prolonged mechanical ventilation   May accept tube feeds temporarily but not if cognitively unaware   Review of Systems Appetite is fine Weight is going up slightly Wears seat belt Sleeps great Teeth are fine--- keeps up with dentist Skin cancer this year---keeps up with derm No heartburn Occasional "lump" feeling when swallowing water Voids fine--- some urgency (wears diapers for stool)    Objective:   Physical Exam  Constitutional: She is oriented to person, place, and time. She appears well-developed. No distress.  HENT:  Mouth/Throat: Oropharynx is clear and moist. No oropharyngeal exudate.  Neck: No thyromegaly present.  Cardiovascular: Normal rate, regular rhythm, normal heart sounds and intact distal pulses. Exam reveals no gallop.  No murmur heard. Respiratory: Effort normal and breath sounds normal. No respiratory distress. She has no wheezes. She has no rales.  GI: Soft. There is no tenderness.  Musculoskeletal: She exhibits no edema or tenderness.  Lymphadenopathy:    She has no cervical adenopathy.  Neurological: She is alert and oriented to person, place, and time.  Skin: No rash noted. No erythema.  Psychiatric: She has a normal mood and affect. Her behavior is normal.           Assessment & Plan:

## 2018-01-12 NOTE — Progress Notes (Signed)
Hearing Screening Comments: Has hearing aids. Wearing them today. Vision Screening Comments: January 09, 2018

## 2018-01-12 NOTE — Assessment & Plan Note (Signed)
Satisfied with immunotherapy and allegra

## 2018-01-12 NOTE — Assessment & Plan Note (Signed)
Okay with her regimen

## 2018-01-12 NOTE — Assessment & Plan Note (Signed)
Has DNR 

## 2018-01-12 NOTE — Assessment & Plan Note (Signed)
Seems to be euthryoid Due for labs

## 2018-01-24 DIAGNOSIS — J301 Allergic rhinitis due to pollen: Secondary | ICD-10-CM | POA: Diagnosis not present

## 2018-01-29 DIAGNOSIS — J301 Allergic rhinitis due to pollen: Secondary | ICD-10-CM | POA: Diagnosis not present

## 2018-01-30 ENCOUNTER — Ambulatory Visit (INDEPENDENT_AMBULATORY_CARE_PROVIDER_SITE_OTHER): Payer: Medicare Other | Admitting: Family Medicine

## 2018-01-30 ENCOUNTER — Encounter: Payer: Self-pay | Admitting: Family Medicine

## 2018-01-30 VITALS — BP 106/70 | HR 76 | Temp 97.7°F | Ht 63.5 in | Wt 135.8 lb

## 2018-01-30 DIAGNOSIS — Z23 Encounter for immunization: Secondary | ICD-10-CM

## 2018-01-30 DIAGNOSIS — Z131 Encounter for screening for diabetes mellitus: Secondary | ICD-10-CM | POA: Diagnosis not present

## 2018-01-30 DIAGNOSIS — S61216A Laceration without foreign body of right little finger without damage to nail, initial encounter: Secondary | ICD-10-CM

## 2018-01-30 LAB — BASIC METABOLIC PANEL
BUN: 18 mg/dL (ref 6–23)
CALCIUM: 9.2 mg/dL (ref 8.4–10.5)
CHLORIDE: 107 meq/L (ref 96–112)
CO2: 29 meq/L (ref 19–32)
CREATININE: 0.81 mg/dL (ref 0.40–1.20)
GFR: 71.79 mL/min (ref >60.00)
GLUCOSE: 79 mg/dL (ref 70–99)
Potassium: 4.4 mEq/L (ref 3.5–5.1)
Sodium: 140 mEq/L (ref 135–145)

## 2018-01-30 NOTE — Progress Notes (Signed)
   Subjective:    Patient ID: Ariel Phillips, female    DOB: 1933-02-18, 84 y.o.   MRN: [id number]  HPI   84 year old female presents following pinky finger laceration 2 days ago with mandolin.  Sliced off pad off right 5th finger tip.  She states that she has placed gauze but each time she tries to remove the gauze it starts re-bleeding. Now gauze stuck to wound.  She is not on any anticoagulant.  She requests a flu shot. She is concerned she has developed diabetes.Ariel Phillips family history. She has noted decrease in urination, no change thirsty,  Low energy but this is normal for her.   Td given in 2012.  Blood pressure 106/70, pulse 76, temperature 97.7 F (36.5 C), temperature source Oral, height 5' 3.5" (1.613 m), weight 135 lb 12 oz (61.6 kg).  Review of Systems  Constitutional: Negative for fatigue and fever.  HENT: Negative for ear pain.   Eyes: Negative for pain.  Respiratory: Negative for chest tightness and shortness of breath.   Cardiovascular: Negative for chest pain, palpitations and leg swelling.  Gastrointestinal: Negative for abdominal pain.  Genitourinary: Negative for dysuria.       Objective:   Physical Exam  Constitutional: Vital signs are normal. She appears well-developed and well-nourished. She is cooperative.  Non-toxic appearance. She does not appear ill. No distress.  HENT:  Head: Normocephalic.  Right Ear: Hearing, tympanic membrane, external ear and ear canal normal. Tympanic membrane is not erythematous, not retracted and not bulging.  Left Ear: Hearing, tympanic membrane, external ear and ear canal normal. Tympanic membrane is not erythematous, not retracted and not bulging.  Nose: No mucosal edema or rhinorrhea. Right sinus exhibits no maxillary sinus tenderness and no frontal sinus tenderness. Left sinus exhibits no maxillary sinus tenderness and no frontal sinus tenderness.  Mouth/Throat: Uvula is midline, oropharynx is clear and moist and  mucous membranes are normal.  Eyes: Pupils are equal, round, and reactive to light. Conjunctivae, EOM and lids are normal. Lids are everted and swept, no foreign bodies found.  Neck: Trachea normal and normal range of motion. Neck supple. Carotid bruit is not present. No thyroid mass and no thyromegaly present.  Cardiovascular: Normal rate, regular rhythm, S1 normal, S2 normal, normal heart sounds, intact distal pulses and normal pulses. Exam reveals no gallop and no friction rub.  No murmur heard. Pulmonary/Chest: Effort normal and breath sounds normal. No tachypnea. No respiratory distress. She has no decreased breath sounds. She has no wheezes. She has no rhonchi. She has no rales.  Abdominal: Soft. Normal appearance and bowel sounds are normal. There is no tenderness.  Neurological: She is alert.  Skin: Skin is warm, dry and intact. No rash noted.   1 cm oblong area of tissue shallowly lacerated off completely,  Psychiatric: Her speech is normal and behavior is normal. Judgment and thought content normal. Her mood appears not anxious. Cognition and memory are normal. She does not exhibit a depressed mood.    Gauze was gently soaked off in warm water. Pressure applied to stop bleeding.  hemostasis achieved. Wound visualized and appears to be clean. Pressure dressing applied.        Assessment & Plan:

## 2018-01-30 NOTE — Patient Instructions (Addendum)
Please stop at the lab to have labs drawn.  Keep wound clean and dry.

## 2018-02-05 DIAGNOSIS — J301 Allergic rhinitis due to pollen: Secondary | ICD-10-CM | POA: Diagnosis not present

## 2018-02-12 DIAGNOSIS — J301 Allergic rhinitis due to pollen: Secondary | ICD-10-CM | POA: Diagnosis not present

## 2018-02-19 DIAGNOSIS — J301 Allergic rhinitis due to pollen: Secondary | ICD-10-CM | POA: Diagnosis not present

## 2018-02-21 MED ORDER — PANTOPRAZOLE SODIUM 40 MG PO TBEC: 40.0000 mg | DELAYED_RELEASE_TABLET | Freq: Every day | ORAL | 3 refills | Status: DC

## 2018-02-26 DIAGNOSIS — J301 Allergic rhinitis due to pollen: Secondary | ICD-10-CM | POA: Diagnosis not present

## 2018-03-12 DIAGNOSIS — J301 Allergic rhinitis due to pollen: Secondary | ICD-10-CM | POA: Diagnosis not present

## 2018-03-19 DIAGNOSIS — J301 Allergic rhinitis due to pollen: Secondary | ICD-10-CM | POA: Diagnosis not present

## 2018-03-26 DIAGNOSIS — J301 Allergic rhinitis due to pollen: Secondary | ICD-10-CM | POA: Diagnosis not present

## 2018-04-09 DIAGNOSIS — J301 Allergic rhinitis due to pollen: Secondary | ICD-10-CM | POA: Diagnosis not present

## 2018-04-19 DIAGNOSIS — L821 Other seborrheic keratosis: Secondary | ICD-10-CM | POA: Diagnosis not present

## 2018-04-19 DIAGNOSIS — D2271 Melanocytic nevi of right lower limb, including hip: Secondary | ICD-10-CM | POA: Diagnosis not present

## 2018-04-19 DIAGNOSIS — D2272 Melanocytic nevi of left lower limb, including hip: Secondary | ICD-10-CM | POA: Diagnosis not present

## 2018-04-19 DIAGNOSIS — D225 Melanocytic nevi of trunk: Secondary | ICD-10-CM | POA: Diagnosis not present

## 2018-04-19 DIAGNOSIS — Z08 Encounter for follow-up examination after completed treatment for malignant neoplasm: Secondary | ICD-10-CM | POA: Diagnosis not present

## 2018-04-19 DIAGNOSIS — Z85828 Personal history of other malignant neoplasm of skin: Secondary | ICD-10-CM | POA: Diagnosis not present

## 2018-04-19 DIAGNOSIS — D2262 Melanocytic nevi of left upper limb, including shoulder: Secondary | ICD-10-CM | POA: Diagnosis not present

## 2018-04-19 DIAGNOSIS — D2261 Melanocytic nevi of right upper limb, including shoulder: Secondary | ICD-10-CM | POA: Diagnosis not present

## 2018-05-04 DIAGNOSIS — L538 Other specified erythematous conditions: Secondary | ICD-10-CM | POA: Diagnosis not present

## 2018-05-04 DIAGNOSIS — L820 Inflamed seborrheic keratosis: Secondary | ICD-10-CM | POA: Diagnosis not present

## 2018-05-11 ENCOUNTER — Encounter: Payer: Self-pay | Admitting: Internal Medicine

## 2018-05-11 ENCOUNTER — Ambulatory Visit (INDEPENDENT_AMBULATORY_CARE_PROVIDER_SITE_OTHER): Payer: Medicare Other | Admitting: Internal Medicine

## 2018-05-11 VITALS — BP 112/70 | HR 43 | Temp 97.5°F | Ht 64.0 in | Wt 139.0 lb

## 2018-05-11 DIAGNOSIS — Q667 Congenital pes cavus, unspecified foot: Secondary | ICD-10-CM | POA: Insufficient documentation

## 2018-05-11 DIAGNOSIS — M436 Torticollis: Secondary | ICD-10-CM | POA: Diagnosis not present

## 2018-05-11 DIAGNOSIS — K589 Irritable bowel syndrome without diarrhea: Secondary | ICD-10-CM

## 2018-05-11 NOTE — Assessment & Plan Note (Signed)
Probably causing mechanical pain Discussed even better support (has good ones but may need more)

## 2018-05-11 NOTE — Assessment & Plan Note (Signed)
Doing well with intermittent imodium

## 2018-05-11 NOTE — Progress Notes (Signed)
Subjective:    Patient ID: Ariel Phillips, female    DOB: 1933-01-31, 85 y.o.   MRN: [id number]  HPI Here due to issues with IBS and some other concerns  Asks me to check her toenails on left They are "growing in" Was told by Dr Paulla Dolly that she needs them removed No pain  Has some left foot pain Can be bad first thing in the morning (top of forefoot) So bad one day 2 weeks ago, couldn't walk at all (had to hobble) Better now--but still painful going down stairs Had "bone chip" in past--wonders about this now  Some IBS issues Sister has similar problems---- success with imodium Uses for flare of "horrible, soft diarrhea"---takes 1 or sometimes 2 Not recently (so at most 1-2 times a month)  Some stiff neck Notices she was turning her entire body to look to the side Notes decreased ability to turn Concerned about safety (in car)  Current Outpatient Medications on File Prior to Visit  Medication Sig Dispense Refill  . albuterol (PROAIR HFA) 108 (90 Base) MCG/ACT inhaler Inhale 2 puffs into the lungs every 6 (six) hours as needed for wheezing or shortness of breath.    . carboxymethylcellulose (REFRESH PLUS) 0.5 % SOLN Place 2 drops into both eyes daily as needed (dry eyes).     Marland Kitchen EPINEPHrine 0.3 mg/0.3 mL IJ SOAJ injection Inject 0.3 mg into the muscle once. Use prn, takes allergy shots and has this on hand for adverse reactions    . fexofenadine (ALLEGRA) 180 MG tablet Take 180 mg by mouth daily.    Marland Kitchen levothyroxine (SYNTHROID, LEVOTHROID) 75 MCG tablet TAKE ONE TABLET DAILY BEFORE BREAKFAST 90 tablet 3  . loratadine (CLARITIN) 10 MG tablet Take 10 mg by mouth daily. Alternate with Allegra    . UNABLE TO FIND Med Name: Allergy Shots     No current facility-administered medications on file prior to visit.     Allergies  Allergen Reactions  . Penicillins Diarrhea  . Latex Rash    Past Medical History:  Diagnosis Date  . Allergic asthma   . Allergic rhinitis due to  pollen   . Allergy   . GERD (gastroesophageal reflux disease)   . IBS (irritable bowel syndrome)   . Osteopenia   . Unspecified hypothyroidism     Past Surgical History:  Procedure Laterality Date  . ANAL FISSURE REPAIR  1950's  . BASAL CELL CARCINOMA EXCISION  2000's   nose  . CHOLECYSTECTOMY    . COLONOSCOPY  Multiple   Negative screening exams  . [place] or so   with fissure repair  . TUBAL LIGATION      Family History  Problem Relation Age of Onset  . COPD Mother   . Cancer Mother        unclear diagnosis  . Transient ischemic attack Mother   . Diabetes Sister   . Hypertension Sister   . Stroke Other   . Cancer Paternal Grandmother        ?breast  . Heart disease Neg Hx     Social History   Socioeconomic History  . Marital status: Divorced    Spouse name: Not on file  . Number of children: 1  . Years of education: Not on file  . Highest education level: Not on file  Occupational History  . Occupation: Retired Personnel officer  Social Needs  . Financial resource strain: Not on file  . Food insecurity:  Worry: Not on file    Inability: Not on file  . Transportation needs:    Medical: Not on file    Non-medical: Not on file  Tobacco Use  . Smoking status: Never Smoker  . Smokeless tobacco: Never Used  Substance and Sexual Activity  . Alcohol use: Yes    Comment: rare wine  . Drug use: No  . Sexual activity: Not on file  Lifestyle  . Physical activity:    Days per week: Not on file    Minutes per session: Not on file  . Stress: Not on file  Relationships  . Social connections:    Talks on phone: Not on file    Gets together: Not on file    Attends religious service: Not on file    Active member of club or organization: Not on file    Attends meetings of clubs or organizations: Not on file    Relationship status: Not on file  . Intimate partner violence:    Fear of current or ex partner: Not on file    Emotionally  abused: Not on file    Physically abused: Not on file    Forced sexual activity: Not on file  Other Topics Concern  . Not on file  Social History Narrative   Divorced and retired , moved from [place], [place]   1 Daughter in [place]   Has living will   Daughter has health care POA.---[name]   Requests DNR--order done 06/19/12   Would not want prolonged mechanical ventilation   May accept tube feeds temporarily but not if cognitively unaware   Review of Systems Not sick No fevers    Objective:   Physical Exam  Neck:  SCM tight bilaterally Slight decreased extension Moderate decrease in rotation both ways Flexion okay, tilt decreased also  GI: Soft. There is no abdominal tenderness.  Musculoskeletal:     Comments: Very high arch in feet Mild tenderness proximal to 5th MCP  Neurological:  No weakness in arms  Skin:  Thin mycotic toenails on left---some ingrowing distally (can be trimmed)           Assessment & Plan:

## 2018-05-11 NOTE — Assessment & Plan Note (Addendum)
Definite muscle component--but not clearly arthritic (could be) No radiculopathy Discussed heat and tylenol prn If worsens, would check x-ray Discussed changing pillow

## 2018-05-29 ENCOUNTER — Ambulatory Visit (INDEPENDENT_AMBULATORY_CARE_PROVIDER_SITE_OTHER): Payer: Medicare Other | Admitting: Internal Medicine

## 2018-05-29 ENCOUNTER — Encounter: Payer: Self-pay | Admitting: Internal Medicine

## 2018-05-29 VITALS — BP 102/70 | HR 74 | Temp 97.5°F | Ht 64.0 in | Wt 139.0 lb

## 2018-05-29 DIAGNOSIS — M436 Torticollis: Secondary | ICD-10-CM

## 2018-05-29 NOTE — Assessment & Plan Note (Signed)
Seems to be muscular Nothing to suggest nerve compression or sig bony abnormality She is quite restricted---will try PT at [facility]

## 2018-05-29 NOTE — Progress Notes (Signed)
Subjective:    Patient ID: Ariel Phillips, female    DOB: 1933/04/12, 85 y.o.   MRN: [id number]  HPI Here to evaluate neck pain  Mostly just limited ROM Hard to rotate--feels tight in muscles Now tight in the middle as well Painful at times---the heat wrap does help  No radiation of pain to shoulders or arms No arm weakness  Did get startled some years ago--injured neck and couldn't turn This was a long time ago---and did recover slowly  Current Outpatient Medications on File Prior to Visit  Medication Sig Dispense Refill  . albuterol (PROAIR HFA) 108 (90 Base) MCG/ACT inhaler Inhale 2 puffs into the lungs every 6 (six) hours as needed for wheezing or shortness of breath.    . carboxymethylcellulose (REFRESH PLUS) 0.5 % SOLN Place 2 drops into both eyes daily as needed (dry eyes).     Marland Kitchen EPINEPHrine 0.3 mg/0.3 mL IJ SOAJ injection Inject 0.3 mg into the muscle once. Use prn, takes allergy shots and has this on hand for adverse reactions    . levothyroxine (SYNTHROID, LEVOTHROID) 75 MCG tablet TAKE ONE TABLET DAILY BEFORE BREAKFAST 90 tablet 3  . loratadine (CLARITIN) 10 MG tablet Take 10 mg by mouth daily. Alternate with Allegra     No current facility-administered medications on file prior to visit.     Allergies  Allergen Reactions  . Penicillins Diarrhea  . Latex Rash    Past Medical History:  Diagnosis Date  . Allergic asthma   . Allergic rhinitis due to pollen   . Allergy   . GERD (gastroesophageal reflux disease)   . IBS (irritable bowel syndrome)   . Osteopenia   . Unspecified hypothyroidism     Past Surgical History:  Procedure Laterality Date  . ANAL FISSURE REPAIR  1950's  . BASAL CELL CARCINOMA EXCISION  2000's   nose  . CHOLECYSTECTOMY    . COLONOSCOPY  Multiple   Negative screening exams  . [place] or so   with fissure repair  . TUBAL LIGATION      Family History  Problem Relation Age of Onset  . COPD Mother   . Cancer  Mother        unclear diagnosis  . Transient ischemic attack Mother   . Diabetes Sister   . Hypertension Sister   . Stroke Other   . Cancer Paternal Grandmother        ?breast  . Heart disease Neg Hx     Social History   Socioeconomic History  . Marital status: Divorced    Spouse name: Not on file  . Number of children: 1  . Years of education: Not on file  . Highest education level: Not on file  Occupational History  . Occupation: Retired Personnel officer  Social Needs  . Financial resource strain: Not on file  . Food insecurity:    Worry: Not on file    Inability: Not on file  . Transportation needs:    Medical: Not on file    Non-medical: Not on file  Tobacco Use  . Smoking status: Never Smoker  . Smokeless tobacco: Never Used  Substance and Sexual Activity  . Alcohol use: Yes    Comment: rare wine  . Drug use: No  . Sexual activity: Not on file  Lifestyle  . Physical activity:    Days per week: Not on file    Minutes per session: Not on file  . Stress: Not on file  Relationships  . Social connections:    Talks on phone: Not on file    Gets together: Not on file    Attends religious service: Not on file    Active member of club or organization: Not on file    Attends meetings of clubs or organizations: Not on file    Relationship status: Not on file  . Intimate partner violence:    Fear of current or ex partner: Not on file    Emotionally abused: Not on file    Physically abused: Not on file    Forced sexual activity: Not on file  Other Topics Concern  . Not on file  Social History Narrative   Divorced and retired , moved from [place], [place]   1 Daughter in [place]   Has living will   Daughter has health care POA.---Ariel Phillips   Requests DNR--order done 06/19/12   Would not want prolonged mechanical ventilation   May accept tube feeds temporarily but not if cognitively unaware   Review of Systems Feet are better No fever Not  sick Not bothering her at night--but worse when she wakes up Does prop some in bed due to reflux--discussed using bricks under bedposts    Objective:   Physical Exam  Constitutional: She appears well-developed. No distress.  Neck:  Tenderness along posterior portion of SCM muscles bilaterally Mild restriction of A-P motion Moderate restriction of tilt and rotation  Neurological:  Normal strength in arms           Assessment & Plan:

## 2018-06-05 DIAGNOSIS — M542 Cervicalgia: Secondary | ICD-10-CM | POA: Diagnosis not present

## 2018-06-07 DIAGNOSIS — M542 Cervicalgia: Secondary | ICD-10-CM | POA: Diagnosis not present

## 2018-06-11 DIAGNOSIS — M542 Cervicalgia: Secondary | ICD-10-CM | POA: Diagnosis not present

## 2018-06-14 DIAGNOSIS — M542 Cervicalgia: Secondary | ICD-10-CM | POA: Diagnosis not present

## 2018-06-15 DIAGNOSIS — M542 Cervicalgia: Secondary | ICD-10-CM | POA: Diagnosis not present

## 2018-06-19 DIAGNOSIS — M542 Cervicalgia: Secondary | ICD-10-CM | POA: Diagnosis not present

## 2018-06-21 DIAGNOSIS — M542 Cervicalgia: Secondary | ICD-10-CM | POA: Diagnosis not present

## 2018-06-25 ENCOUNTER — Telehealth: Payer: Self-pay

## 2018-06-25 DIAGNOSIS — M542 Cervicalgia: Secondary | ICD-10-CM | POA: Diagnosis not present

## 2018-06-25 NOTE — Telephone Encounter (Signed)
Okay noted

## 2018-06-25 NOTE — Telephone Encounter (Signed)
I spoke with pt and the H/A resolved; pt had sharp pain in head for short period. H/A went away and pt did not go to UC over weekend and does not want appt with Dr Silvio Pate today. Pt thinks could be related to difficulty turning her head and will cb for appt if needed. FYI to Dr Silvio Pate.

## 2018-06-25 NOTE — Telephone Encounter (Signed)
[facility] Patient Name: Ariel Phillips Gender: Female DOB: 06-Apr-1933 Age: 85 Y 3 M 20 D Return Phone Number: [phone] (Primary), [phone] (Secondary) Address: City/State/Zip: [place] [address] Client  [facility] Night - Client Client Site [facility] Physician [name] - MD Contact Type Call Who Is Calling Patient / Member / Family / Caregiver Call Type Triage / Clinical Relationship To Patient Self Return Phone Number [phone] (Secondary) Chief Complaint Headache Reason for Call Symptomatic / Request for [place] states for the last month she couldn't turn her head. She has been having sessions for this. She feels like her skull is too tight. She wakes with very painful headache on her skull. Translation No Nurse Assessment Nurse: Gareth Eagle, RN, Raquel Sarna Date/Time Eilene Ghazi Time): 06/23/2018 10:39:56 AM Confirm and document reason for call. If symptomatic, describe symptoms. ---Caller states about a month ago, began having difficulty turning head. Saw PCP and has been going to PT, which seems to help. Has recently been awakening with headaches with sore point on near top of skull. When that particular area is massaged, headache improves. Has the patient traveled to [place] OR had close contact with a person known to have the novel coronavirus illness in the last 14 days? ---Not Applicable Does the patient have any new or worsening symptoms? ---Yes Will a triage be completed? ---Yes Related visit to physician within the last 2 weeks? ---No Does the PT have any chronic conditions? (i.e. diabetes, asthma, this includes High risk factors for pregnancy, etc.) ---Yes List chronic conditions. ---IBS Is this a behavioral health or substance abuse call? ---No Guidelines Guideline Title  Affirmed Question Affirmed Notes Nurse Date/Time Eilene Ghazi Time) Headache [1] New headache AND [2] age > 55 Theodoro Grist 06/23/2018 10:44:07 AM Disp. Time Eilene Ghazi Time) Disposition Final User 06/23/2018 10:49:42 AM See PCP within 24 Hours Yes Gareth Eagle, RN, Raquel Sarna PLEASE NOTE: All timestamps contained within this report are represented as [place] Standard Time. CONFIDENTIALTY NOTICE: This fax transmission is intended only for the addressee. It contains information that is legally privileged, confidential or otherwise protected from use or disclosure. If you are not the intended recipient, you are strictly prohibited from reviewing, disclosing, copying using or disseminating any of this information or taking any action in reliance on or regarding this information. If you have received this fax in error, please notify us immediately by telephone so that we can arrange for its return to [place]. Phone: [phone], Toll-Free: [phone], Fax: [phone] Page: 2 of 2 Call Id: [id number] [place] Disagree/Comply Disagree Caller Understands Yes PreDisposition Did not know what to do Care Advice Given Per Guideline SEE PCP WITHIN 24 HOURS: * IF OFFICE WILL BE CLOSED AND NO PCP (PRIMARY CARE PROVIDER) SECONDLEVEL TRIAGE: You need to be seen within the next 24 hours. A clinic or an urgent care center is often a good source of care if your doctor's office is closed or you can't get an appointment. PAIN MEDICINES: * For pain relief, take acetaminophen, ibuprofen, or naproxen. CALL BACK IF: * Blurred vision or double vision occurs * Numbness or weakness of the face, arm or leg * Difficulty with speaking * You become worse. CARE ADVICE given per Headache (Adult) guideline. Referrals REFERRED TO PCP OFFICE

## 2018-06-26 DIAGNOSIS — M542 Cervicalgia: Secondary | ICD-10-CM | POA: Diagnosis not present

## 2018-06-28 DIAGNOSIS — M542 Cervicalgia: Secondary | ICD-10-CM | POA: Diagnosis not present

## 2018-06-29 DIAGNOSIS — M542 Cervicalgia: Secondary | ICD-10-CM | POA: Diagnosis not present

## 2018-07-05 DIAGNOSIS — M542 Cervicalgia: Secondary | ICD-10-CM | POA: Diagnosis not present

## 2018-07-09 DIAGNOSIS — M542 Cervicalgia: Secondary | ICD-10-CM | POA: Diagnosis not present

## 2018-07-10 DIAGNOSIS — M542 Cervicalgia: Secondary | ICD-10-CM | POA: Diagnosis not present

## 2018-07-10 DIAGNOSIS — Q130 Coloboma of iris: Secondary | ICD-10-CM | POA: Diagnosis not present

## 2018-07-13 DIAGNOSIS — M542 Cervicalgia: Secondary | ICD-10-CM | POA: Diagnosis not present

## 2018-07-16 DIAGNOSIS — M542 Cervicalgia: Secondary | ICD-10-CM | POA: Diagnosis not present

## 2018-07-17 DIAGNOSIS — M542 Cervicalgia: Secondary | ICD-10-CM | POA: Diagnosis not present

## 2018-07-19 DIAGNOSIS — M542 Cervicalgia: Secondary | ICD-10-CM | POA: Diagnosis not present

## 2018-07-23 DIAGNOSIS — M542 Cervicalgia: Secondary | ICD-10-CM | POA: Diagnosis not present

## 2018-07-24 DIAGNOSIS — M542 Cervicalgia: Secondary | ICD-10-CM | POA: Diagnosis not present

## 2018-07-26 DIAGNOSIS — M542 Cervicalgia: Secondary | ICD-10-CM | POA: Diagnosis not present

## 2018-08-10 DIAGNOSIS — L2084 Intrinsic (allergic) eczema: Secondary | ICD-10-CM | POA: Diagnosis not present

## 2018-08-14 ENCOUNTER — Telehealth: Payer: Self-pay | Admitting: Family Medicine

## 2018-08-14 NOTE — Telephone Encounter (Signed)
Okay Visit only if persists or worsens

## 2018-08-14 NOTE — Telephone Encounter (Signed)
Message from pt sent to Dr Silvio Pate

## 2018-08-14 NOTE — Telephone Encounter (Signed)
See mychart message.  Please triage this patient, offer web visit if needed.  Routed to PCP also.   Thanks.

## 2018-08-14 NOTE — Telephone Encounter (Signed)
If she is still having symptoms, set up a video visit for tomorrow. I would not recommend the imodium if this is any kind of infection---just keep up with fluid intake

## 2018-08-14 NOTE — Telephone Encounter (Signed)
I spoke with Ariel Phillips; Ariel Phillips started with diarrhea on 08/10/18; immodium is not effective.diarrhea is starting to get better today; loose stool but not watery and no dark stools,mucus or blood seen. No abd pain. Ariel Phillips has IBS but this loose BM is not like IBS since no cramping. No fever,Ariel Phillips does not have thermometer. No cough or SOB; no travel and no exposure to + covid or flu. Ariel Phillips thinks today the loose stools are better; Ariel Phillips has had 2 -3 BMs today and that is Ariel Phillips "normal". Ariel Phillips will cb if condition changes or worsens. FYI to Dr Silvio Pate.

## 2018-09-21 DIAGNOSIS — Z85828 Personal history of other malignant neoplasm of skin: Secondary | ICD-10-CM | POA: Diagnosis not present

## 2018-09-21 DIAGNOSIS — L821 Other seborrheic keratosis: Secondary | ICD-10-CM | POA: Diagnosis not present

## 2018-09-21 DIAGNOSIS — Z08 Encounter for follow-up examination after completed treatment for malignant neoplasm: Secondary | ICD-10-CM | POA: Diagnosis not present

## 2018-09-25 ENCOUNTER — Other Ambulatory Visit: Payer: Self-pay | Admitting: Internal Medicine

## 2018-10-08 DIAGNOSIS — Z1159 Encounter for screening for other viral diseases: Secondary | ICD-10-CM | POA: Diagnosis not present

## 2018-10-08 DIAGNOSIS — J309 Allergic rhinitis, unspecified: Secondary | ICD-10-CM | POA: Diagnosis not present

## 2018-10-08 DIAGNOSIS — R0982 Postnasal drip: Secondary | ICD-10-CM | POA: Diagnosis not present

## 2018-10-08 DIAGNOSIS — Z20828 Contact with and (suspected) exposure to other viral communicable diseases: Secondary | ICD-10-CM | POA: Diagnosis not present

## 2018-10-08 DIAGNOSIS — R05 Cough: Secondary | ICD-10-CM | POA: Diagnosis not present

## 2018-10-25 DIAGNOSIS — S61209A Unspecified open wound of unspecified finger without damage to nail, initial encounter: Secondary | ICD-10-CM | POA: Diagnosis not present

## 2018-11-23 DIAGNOSIS — Z20828 Contact with and (suspected) exposure to other viral communicable diseases: Secondary | ICD-10-CM | POA: Diagnosis not present

## 2018-11-29 ENCOUNTER — Emergency Department
Admission: EM | Admit: 2018-11-29 | Discharge: 2018-11-29 | Disposition: A | Discharge status: Home / Self Care | Payer: Medicare Other | Attending: Student in an Organized Health Care Education/Training Program | Admitting: Student in an Organized Health Care Education/Training Program

## 2018-11-29 ENCOUNTER — Encounter: Payer: Self-pay | Admitting: Emergency Medicine

## 2018-11-29 ENCOUNTER — Emergency Department: Payer: Medicare Other

## 2018-11-29 ENCOUNTER — Telehealth: Payer: Self-pay | Admitting: Internal Medicine

## 2018-11-29 DIAGNOSIS — Z91040 Latex allergy status: Secondary | ICD-10-CM | POA: Insufficient documentation

## 2018-11-29 DIAGNOSIS — Z85828 Personal history of other malignant neoplasm of skin: Secondary | ICD-10-CM | POA: Insufficient documentation

## 2018-11-29 DIAGNOSIS — Z79899 Other long term (current) drug therapy: Secondary | ICD-10-CM | POA: Insufficient documentation

## 2018-11-29 DIAGNOSIS — R079 Chest pain, unspecified: Secondary | ICD-10-CM | POA: Diagnosis not present

## 2018-11-29 DIAGNOSIS — R0789 Other chest pain: Secondary | ICD-10-CM | POA: Insufficient documentation

## 2018-11-29 DIAGNOSIS — R0602 Shortness of breath: Secondary | ICD-10-CM | POA: Diagnosis not present

## 2018-11-29 DIAGNOSIS — E039 Hypothyroidism, unspecified: Secondary | ICD-10-CM | POA: Insufficient documentation

## 2018-11-29 DIAGNOSIS — J45909 Unspecified asthma, uncomplicated: Secondary | ICD-10-CM | POA: Diagnosis not present

## 2018-11-29 DIAGNOSIS — Z20828 Contact with and (suspected) exposure to other viral communicable diseases: Secondary | ICD-10-CM | POA: Insufficient documentation

## 2018-11-29 DIAGNOSIS — R072 Precordial pain: Secondary | ICD-10-CM | POA: Diagnosis not present

## 2018-11-29 DIAGNOSIS — R7989 Other specified abnormal findings of blood chemistry: Secondary | ICD-10-CM | POA: Diagnosis not present

## 2018-11-29 LAB — CBC
HCT: 42.3 % (ref 36.0–46.0)
Hemoglobin: 13.9 g/dL (ref 12.0–15.0)
MCH: 31.2 pg (ref 26.0–34.0)
MCHC: 32.9 g/dL (ref 30.0–36.0)
MCV: 94.8 fL (ref 80.0–100.0)
Platelets: 179 10*3/uL (ref 150–400)
RBC: 4.46 MIL/uL (ref 3.87–5.11)
RDW: 12.6 % (ref 11.5–15.5)
WBC: 6.3 10*3/uL (ref 4.0–10.5)
nRBC: 0 % (ref 0.0–0.2)

## 2018-11-29 LAB — TROPONIN I (HIGH SENSITIVITY)
Troponin I (High Sensitivity): 5 ng/L (ref <18)
Troponin I (High Sensitivity): 4 ng/L (ref <18)

## 2018-11-29 LAB — BASIC METABOLIC PANEL
Anion gap: 7 (ref 5–15)
BUN: 19 mg/dL (ref 8–23)
CO2: 27 mmol/L (ref 22–32)
Calcium: 9.3 mg/dL (ref 8.9–10.3)
Chloride: 108 mmol/L (ref 98–111)
Creatinine, Ser: 0.63 mg/dL (ref 0.44–1.00)
GFR calc Af Amer: >60 mL/min (ref >60)
GFR calc non Af Amer: >60 mL/min (ref >60)
Glucose, Bld: 100 mg/dL — ABNORMAL HIGH (ref 70–99)
Potassium: 4.2 mmol/L (ref 3.5–5.1)
Sodium: 142 mmol/L (ref 135–145)

## 2018-11-29 LAB — FIBRIN DERIVATIVES D-DIMER (ARMC ONLY): Fibrin derivatives D-dimer (ARMC): 529.85 ng/mL (FEU) — ABNORMAL HIGH (ref 0.00–499.00)

## 2018-11-29 LAB — SARS CORONAVIRUS 2 BY RT PCR (HOSPITAL ORDER, PERFORMED IN ~~LOC~~ HOSPITAL LAB): SARS Coronavirus 2: NEGATIVE

## 2018-11-29 MED ORDER — LIDOCAINE VISCOUS HCL 2 % MT SOLN
15.0000 mL | Freq: Once | OROMUCOSAL | Status: AC
  Administered 2018-11-29: 15 mL via ORAL
  Filled 2018-11-29: qty 15

## 2018-11-29 MED ORDER — SODIUM CHLORIDE 0.9% FLUSH
3.0000 mL | Freq: Once | INTRAVENOUS | Status: AC
  Administered 2018-11-29: 3 mL via INTRAVENOUS

## 2018-11-29 MED ORDER — IOHEXOL 350 MG/ML SOLN
75.0000 mL | Freq: Once | INTRAVENOUS | Status: AC | PRN
  Administered 2018-11-29: 75 mL via INTRAVENOUS

## 2018-11-29 MED ORDER — ALUM & MAG HYDROXIDE-SIMETH 200-200-20 MG/5ML PO SUSP
30.0000 mL | Freq: Once | ORAL | Status: AC
  Administered 2018-11-29: 30 mL via ORAL
  Filled 2018-11-29: qty 30

## 2018-11-29 MED ORDER — FAMOTIDINE 20 MG PO TABS: 20.0000 mg | ORAL_TABLET | Freq: Two times a day (BID) | ORAL | 0 refills | Status: DC

## 2018-11-29 NOTE — ED Notes (Signed)
Pt up to restroom at this time. Pt in NAD. Pt had asked for "tums" and nurse has given pt a GI cocktail.

## 2018-11-29 NOTE — ED Notes (Signed)
Patient transported to X-ray 

## 2018-11-29 NOTE — ED Notes (Signed)
Facility called for transportation

## 2018-11-29 NOTE — ED Notes (Signed)
Manuela Schwartz, nurse with [facility] [phone] before 1700.  Nurse clinic [phone]

## 2018-11-29 NOTE — Telephone Encounter (Signed)
She should take at least 2 hours appart from Levothyroxine.

## 2018-11-29 NOTE — ED Provider Notes (Signed)
[facility] Emergency Department Provider Note    First MD Initiated Contact with Patient 11/29/18 1155     (approximate)  I have reviewed the triage vital signs and the nursing notes.   HISTORY  Chief Complaint Chest Pain    HPI Ariel Phillips is a 85 y.o. female with the below listed past medical history presents the ER with sudden onset nonradiating midsternal chest pain and tightness that occurred around 5:00 this morning after she got up.  Felt that it was similar to her heartburn so she took some Tums with improvement.  She then went back to sleep and then woke up again at 9:00 with more persistent discomfort.  States it does hurt when she takes a deep breath.  Denies any nausea or vomiting.  No fevers.  No diaphoresis.  No pain radiating to her jaw or shoulder.  Denies any abdominal pain.    Past Medical History:  Diagnosis Date  . Allergic asthma   . Allergic rhinitis due to pollen   . Allergy   . GERD (gastroesophageal reflux disease)   . IBS (irritable bowel syndrome)   . Osteopenia   . Unspecified hypothyroidism    Family History  Problem Relation Age of Onset  . COPD Mother   . Cancer Mother        unclear diagnosis  . Transient ischemic attack Mother   . Diabetes Sister   . Hypertension Sister   . Stroke Other   . Cancer Paternal Grandmother        ?breast  . Heart disease Neg Hx    Past Surgical History:  Procedure Laterality Date  . ANAL FISSURE REPAIR  1950's  . BASAL CELL CARCINOMA EXCISION  2000's   nose  . CHOLECYSTECTOMY    . COLONOSCOPY  Multiple   Negative screening exams  . [place] or so   with fissure repair  . TUBAL LIGATION     Patient Active Problem List   Diagnosis Date Noted  . Stiff neck 05/11/2018  . High arches 05/11/2018  . Allergic rhinitis 08/22/2017  . Mood disorder ([place]) 12/30/2016  . Memory loss 12/02/2015  . Benign essential tremor 07/20/2015  . Advanced directives,  counseling/discussion 12/19/2013  . Routine general medical examination at a health care facility 12/13/2012  . Hypothyroidism 12/12/2011  . Allergic rhinitis due to pollen   . Allergic asthma   . GERD (gastroesophageal reflux disease)   . IBS (irritable bowel syndrome)   . Osteopenia       Prior to Admission medications   Medication Sig Start Date End Date Taking? Authorizing Provider  albuterol (PROAIR HFA) 108 (90 Base) MCG/ACT inhaler Inhale 2 puffs into the lungs every 6 (six) hours as needed for wheezing or shortness of breath.    [provider]  carboxymethylcellulose (REFRESH PLUS) 0.5 % SOLN Place 2 drops into both eyes daily as needed (dry eyes).     [provider]  EPINEPHrine 0.3 mg/0.3 mL IJ SOAJ injection Inject 0.3 mg into the muscle once. Use prn, takes allergy shots and has this on hand for adverse reactions    [provider]  famotidine (PEPCID) 20 MG tablet Take 1 tablet (20 mg total) by mouth 2 (two) times daily. 11/29/18 11/29/19  Merlyn Lot, MD  levothyroxine (SYNTHROID) 75 MCG tablet TAKE 1 TABLET BY MOUTH DAILY BEFORE BREAKFAST 09/25/18   [name] I, MD  loratadine (CLARITIN) 10 MG tablet Take 10 mg  by mouth daily. Alternate with Allegra    [provider]    Allergies Penicillins and Latex    Social History Social History   Tobacco Use  . Smoking status: Never Smoker  . Smokeless tobacco: Never Used  Substance Use Topics  . Alcohol use: Yes    Comment: rare wine  . Drug use: No    Review of Systems Patient denies headaches, rhinorrhea, blurry vision, numbness, shortness of breath, chest pain, edema, cough, abdominal pain, nausea, vomiting, diarrhea, dysuria, fevers, rashes or hallucinations unless otherwise stated above in HPI. ____________________________________________   PHYSICAL EXAM:  VITAL SIGNS: Vitals:   11/29/18 1230 11/29/18 1300  BP: 129/71 (!) 151/75  Pulse: 73 79  Resp: 16 19   Temp:    SpO2: 98% 99%    Constitutional: Alert and oriented.  Eyes: Conjunctivae are normal.  Head: Atraumatic. Nose: No congestion/rhinnorhea. Mouth/Throat: Mucous membranes are moist.   Neck: No stridor. Painless ROM.  Cardiovascular: Normal rate, regular rhythm. Grossly normal heart sounds.  Good peripheral circulation. Respiratory: Normal respiratory effort.  No retractions. Lungs CTAB. Gastrointestinal: Soft and nontender. No distention. No abdominal bruits. No CVA tenderness. Genitourinary:  Musculoskeletal: No lower extremity tenderness nor edema.  No joint effusions. Neurologic:  Normal speech and language. No gross focal neurologic deficits are appreciated. No facial droop Skin:  Skin is warm, dry and intact. No rash noted. Psychiatric: Mood and affect are normal. Speech and behavior are normal.  ____________________________________________   LABS (all labs ordered are listed, but only abnormal results are displayed)  Results for orders placed or performed during the hospital encounter of 11/29/18 (from the past 24 hour(s))  Basic metabolic panel     Status: Abnormal   Collection Time: 11/29/18 11:22 AM  Result Value Ref Range   Sodium 142 135 - 145 mmol/L   Potassium 4.2 3.5 - 5.1 mmol/L   Chloride 108 98 - 111 mmol/L   CO2 27 22 - 32 mmol/L   Glucose, Bld 100 (H) 70 - 99 mg/dL   BUN 19 8 - 23 mg/dL   Creatinine, Ser 0.63 0.44 - 1.00 mg/dL   Calcium 9.3 8.9 - 10.3 mg/dL   GFR calc non Af Amer >60 >60 mL/min   GFR calc Af Amer >60 >60 mL/min   Anion gap 7 5 - 15  CBC     Status: None   Collection Time: 11/29/18 11:22 AM  Result Value Ref Range   WBC 6.3 4.0 - 10.5 K/uL   RBC 4.46 3.87 - 5.11 MIL/uL   Hemoglobin 13.9 12.0 - 15.0 g/dL   HCT 42.3 36.0 - 46.0 %   MCV 94.8 80.0 - 100.0 fL   MCH 31.2 26.0 - 34.0 pg   MCHC 32.9 30.0 - 36.0 g/dL   RDW 12.6 11.5 - 15.5 %   Platelets 179 150 - 400 K/uL   nRBC 0.0 0.0 - 0.2 %  Troponin I (High Sensitivity)      Status: None   Collection Time: 11/29/18 11:22 AM  Result Value Ref Range   Troponin I (High Sensitivity) 5 <18 ng/L  SARS Coronavirus 2 (CEPHEID- Performed in [place] hospital lab), Hosp Order     Status: None   Collection Time: 11/29/18 12:39 PM   Specimen: Nasopharyngeal Swab  Result Value Ref Range   SARS Coronavirus 2 NEGATIVE NEGATIVE  Fibrin derivatives D-Dimer ([facility] only)     Status: Abnormal   Collection Time: 11/29/18 12:46 PM  Result Value Ref  Range   Fibrin derivatives D-dimer ([facility]) 529.85 (H) 0.00 - 499.00 ng/mL (FEU)  Troponin I (High Sensitivity)     Status: None   Collection Time: 11/29/18  1:29 PM  Result Value Ref Range   Troponin I (High Sensitivity) 4 <18 ng/L   ____________________________________________  EKG My review and personal interpretation at Time:  11:19  Indication: chest pain  Rate: 80  Rhythm: sinus Axis: normal Other: non specific st abn, no stemi ____________________________________________  RADIOLOGY  I personally reviewed all radiographic images ordered to evaluate for the above acute complaints and reviewed radiology reports and findings.  These findings were personally discussed with the patient.  Please see medical record for radiology report.  ____________________________________________   PROCEDURES  Procedure(s) performed:  Procedures    Critical Care performed: no ____________________________________________   INITIAL IMPRESSION / ASSESSMENT AND PLAN / ED COURSE  Pertinent labs & imaging results that were available during my care of the patient were reviewed by me and considered in my medical decision making (see chart for details).   DDX: ACS, pericarditis, esophagitis, boerhaaves, pe, dissection, pna, bronchitis, costochondritis   Ariel Phillips is a 85 y.o. who presents to the ED with symptoms as described above.  She is pleasant well-appearing in no acute distress.  Initial EKG shows no evidence of acute  ischemia.  Based on description of symptoms will evaluate for ACS with serial enzymes.  Patient feels that this is her reflux but does have some discomfort with taking deep inspiration.  Possible PE.  Will further stratify with d-dimer.  Abdominal exam soft and benign.  Clinical Course as of Nov 28 1457  Thu Nov 29, 2018  1329 D-dimer is elevated.  Based on her pleuritic chest pain will order CT imaging of the chest to exclude PE   [PR]  1422 Repeat troponin is nonischemic.   [PR]  1457 Patient reassessed remains pain-free.  We discussed option for observation in the hospital given her risk factors but also discussed that based on her description of symptoms and risk factors it outpatient follow-up is also possibility.  Patient feels that this is her heartburn.  Is declining hospital evaluation and prefer to follow-up with her PCP which I think is reasonable given her reassuring work-up hemodynamic stability and well appearance.  Have discussed with the patient and available family all diagnostics and treatments performed thus far and all questions were answered to the best of my ability. The patient demonstrates understanding and agreement with plan.    [PR]    Clinical Course User Index [PR] Merlyn Lot, MD    The patient was evaluated in Emergency Department today for the symptoms described in the history of present illness. He/she was evaluated in the context of the global COVID-19 pandemic, which necessitated consideration that the patient might be at risk for infection with the SARS-CoV-2 virus that causes COVID-19. Institutional protocols and algorithms that pertain to the evaluation of patients at risk for COVID-19 are in a state of rapid change based on information released by regulatory bodies including the CDC and federal and state organizations. These policies and algorithms were followed during the patient's care in the ED.  As part of my medical decision making, I reviewed the  following data within the [facility] notes reviewed and incorporated, Labs reviewed, notes from prior ED visits and [place] Controlled Substance Database   ____________________________________________   FINAL CLINICAL IMPRESSION(S) / ED DIAGNOSES  Final diagnoses:  Chest pain, unspecified  type      NEW MEDICATIONS STARTED DURING THIS VISIT:  New Prescriptions   FAMOTIDINE (PEPCID) 20 MG TABLET    Take 1 tablet (20 mg total) by mouth 2 (two) times daily.     Note:  This document was prepared using Dragon voice recognition software and may include unintentional dictation errors.    Merlyn Lot, MD 11/29/18 [phone]

## 2018-11-29 NOTE — Telephone Encounter (Signed)
Patient stated she had to go to [facility] today due to chest pain. They stated that did not see any signs of a heart ache just maybe some acid reflux.   She was prescribed PEPCID.  On the bottle it stated not to take this with any other medications.  Patient stated she is taking levothyroxine and clartin so she would like to know what she should do about her medications   C/B # [phone]

## 2018-11-29 NOTE — ED Triage Notes (Signed)
Pt brought in via [facility] from her [facility] apartment. Pt stating CP "episode" at 0500 this morning which she took Tums for and it went away. Pt stating another CP "episode" occurred at 0900 and she decided she should be seen. Pt was test for COVID last week with no results at this time.  Pt is well appearing at this time. Pain is underneath bilateral breasts and is worse with palpation or deep breathing.

## 2018-11-30 NOTE — Telephone Encounter (Signed)
Spoke to pt. Advised her what Rollene Fare said.

## 2018-11-30 NOTE — Telephone Encounter (Signed)
Left message for pt to call office

## 2018-11-30 NOTE — Telephone Encounter (Signed)
Pt returned your call- she is requesting a call back- will be available after 8:30 am this morning. She said you can leave a msg, if she does not answer.

## 2018-12-04 ENCOUNTER — Ambulatory Visit (INDEPENDENT_AMBULATORY_CARE_PROVIDER_SITE_OTHER): Payer: Medicare Other | Admitting: Internal Medicine

## 2018-12-04 ENCOUNTER — Other Ambulatory Visit: Payer: Self-pay

## 2018-12-04 ENCOUNTER — Encounter: Payer: Self-pay | Admitting: Internal Medicine

## 2018-12-04 DIAGNOSIS — R0789 Other chest pain: Secondary | ICD-10-CM | POA: Insufficient documentation

## 2018-12-04 DIAGNOSIS — K2100 Gastro-esophageal reflux disease with esophagitis, without bleeding: Secondary | ICD-10-CM

## 2018-12-04 MED ORDER — OMEPRAZOLE 20 MG PO CPDR: 20.0000 mg | DELAYED_RELEASE_CAPSULE | Freq: Every day | ORAL | 3 refills | Status: DC

## 2018-12-04 NOTE — Assessment & Plan Note (Signed)
Likely the cause of her symptoms I suspect heaviness is related to reflux--though not true acid type symptom Likely some degree of esophagitis Doesn't tolerate the pepcid Will change to omeprazole--daily for 6 weeks, then can try every other day

## 2018-12-04 NOTE — Assessment & Plan Note (Signed)
Work up ruled out PE, and not likely to be cardiac ischemia after negative enzymes (and had this for a while before the ER visit) The pain is better--but heaviness persists---though improved

## 2018-12-04 NOTE — Progress Notes (Signed)
Subjective:    Patient ID: Ariel Phillips, female    DOB: December 18, 1932, 85 y.o.   MRN: [id number]  HPI Here for ER follow up  Awoke on 7.30--and felt sharp pain in her chest Ate breakfast and it was still there Better after taking old Tums Went back to bed--then the pain recurred upon getting up again Felt it was "coming in waves" Was concerned by the recurrence--so she called 911  ER records reviewed Enzymes negative and the EKG also okay D-dimer was mildly elevated--so had CT angio of chest---basically negative Sent home  Rx for famotidine Feels it turned her urine brown and affected her taste Also felt "tired---just exhausted" No recurrence of pain but still has some of the chest heaviness she has noticed recently  First GERD symptoms in 40's Doesn't remember whether ever persistent Thinks she was on omeprazole in the past Had not had symptoms in the past 3 months Occasional dysphagia---feels "like it got stuck", but will resolve on its own. More for water than food  Current Outpatient Medications on File Prior to Visit  Medication Sig Dispense Refill  . albuterol (PROAIR HFA) 108 (90 Base) MCG/ACT inhaler Inhale 2 puffs into the lungs every 6 (six) hours as needed for wheezing or shortness of breath.    . carboxymethylcellulose (REFRESH PLUS) 0.5 % SOLN Place 2 drops into both eyes daily as needed (dry eyes).     Marland Kitchen EPINEPHrine 0.3 mg/0.3 mL IJ SOAJ injection Inject 0.3 mg into the muscle once. Use prn, takes allergy shots and has this on hand for adverse reactions    . famotidine (PEPCID) 20 MG tablet Take 1 tablet (20 mg total) by mouth 2 (two) times daily. 60 tablet 0  . levothyroxine (SYNTHROID) 75 MCG tablet TAKE 1 TABLET BY MOUTH DAILY BEFORE BREAKFAST 90 tablet 1  . loratadine (CLARITIN) 10 MG tablet Take 10 mg by mouth daily. Alternate with Allegra     No current facility-administered medications on file prior to visit.     Allergies  Allergen Reactions  .  Penicillins Diarrhea  . Latex Rash    Past Medical History:  Diagnosis Date  . Allergic asthma   . Allergic rhinitis due to pollen   . Allergy   . GERD (gastroesophageal reflux disease)   . IBS (irritable bowel syndrome)   . Osteopenia   . Unspecified hypothyroidism     Past Surgical History:  Procedure Laterality Date  . ANAL FISSURE REPAIR  1950's  . BASAL CELL CARCINOMA EXCISION  2000's   nose  . CHOLECYSTECTOMY    . COLONOSCOPY  Multiple   Negative screening exams  . [place] or so   with fissure repair  . TUBAL LIGATION      Family History  Problem Relation Age of Onset  . COPD Mother   . Cancer Mother        unclear diagnosis  . Transient ischemic attack Mother   . Diabetes Sister   . Hypertension Sister   . Stroke Other   . Cancer Paternal Grandmother        ?breast  . Heart disease Neg Hx     Social History   Socioeconomic History  . Marital status: Divorced    Spouse name: Not on file  . Number of children: 1  . Years of education: Not on file  . Highest education level: Not on file  Occupational History  . Occupation: Retired Personnel officer  Social Needs  .  Financial resource strain: Not on file  . Food insecurity    Worry: Not on file    Inability: Not on file  . Transportation needs    Medical: Not on file    Non-medical: Not on file  Tobacco Use  . Smoking status: Never Smoker  . Smokeless tobacco: Never Used  Substance and Sexual Activity  . Alcohol use: Yes    Comment: rare wine  . Drug use: No  . Sexual activity: Not on file  Lifestyle  . Physical activity    Days per week: Not on file    Minutes per session: Not on file  . Stress: Not on file  Relationships  . Social Herbalist on phone: Not on file    Gets together: Not on file    Attends religious service: Not on file    Active member of club or organization: Not on file    Attends meetings of clubs or organizations: Not on file     Relationship status: Not on file  . Intimate partner violence    Fear of current or ex partner: Not on file    Emotionally abused: Not on file    Physically abused: Not on file    Forced sexual activity: Not on file  Other Topics Concern  . Not on file  Social History Narrative   Divorced and retired , moved from [place], [place]   1 Daughter in [place]   Has living will   Daughter has health care POA.---Ariel Phillips   Requests DNR--order done 06/19/12   Would not want prolonged mechanical ventilation   May accept tube feeds temporarily but not if cognitively unaware   Review of Systems Appetite off since on famotidine Weight is fairly stable No cough or SOB----but has had chest heaviness (so has had 3 negative COVID tests) No radiation of the chest heaviness    Objective:   Physical Exam  Constitutional: She appears well-developed. No distress.  Neck: No thyromegaly present.  Cardiovascular: Normal rate, regular rhythm and normal heart sounds. Exam reveals no gallop.  No murmur heard. Respiratory: Effort normal and breath sounds normal. No respiratory distress. She has no wheezes. She has no rales.  GI: Soft. She exhibits no distension. There is no rebound.  Mild epigastric tenderness  Lymphadenopathy:    She has no cervical adenopathy.           Assessment & Plan:

## 2018-12-04 NOTE — Patient Instructions (Signed)
Stop the famotidine (pepcid). Start omeprazole daily on an empty stomach (at bedtime). If your symptoms are completely gone, you can decrease to every other night in about 6 weeks.

## 2018-12-17 ENCOUNTER — Telehealth: Payer: Self-pay

## 2018-12-17 DIAGNOSIS — K2100 Gastro-esophageal reflux disease with esophagitis, without bleeding: Secondary | ICD-10-CM

## 2018-12-17 NOTE — Telephone Encounter (Signed)
[place] Night - Client TELEPHONE ADVICE RECORD [facility] Patient Name: Ariel Phillips Gender: Female DOB: 05/14/1932 Age: 85 Y 9 M 13 D Return Phone Number: [phone] (Primary), [phone] (Secondary) Address: City/State/Zip: [place] [address] Client [facility] Night - Client Client Site [place] Physician [name] - MD Contact Type Call Who Is Calling Patient / Member / Family / Caregiver Call Type Triage / Clinical Relationship To Patient Self Return Phone Number [phone] (Secondary) Chief Complaint CHEST PAIN (>=21 years) - pain, pressure, heaviness or tightness Reason for Call Symptomatic / Request for [name] states she woke up this morning with sharp chest pains. Translation No Nurse Assessment Nurse: Windle Guard, RN, Olin Hauser Date/Time (Eastern Time): 12/15/2018 9:40:14 AM Confirm and document reason for call. If symptomatic, describe symptoms. ---Caller states she is having sharp chest pain. Was seen recently in ER and in office and diagnosed with GERD. On meds but is not effective. Has the patient had close contact with a person known or suspected to have the novel coronavirus illness OR traveled / lives in area with major community spread (including international travel) in the last 14 days from the onset of symptoms? * If Asymptomatic, screen for exposure and travel within the last 14 days. ---No Does the patient have any new or worsening symptoms? ---Yes Will a triage be completed? ---Yes Related visit to physician within the last 2 weeks? ---No Does the PT have any chronic conditions? (i.e. diabetes, asthma, this includes High risk factors for pregnancy, etc.) ---No Is this a behavioral health or substance abuse call? ---No Guidelines Guideline Title Affirmed Question Affirmed Notes Nurse Date/Time Eilene Ghazi Time) Chest Pain [1]  Chest pain lasts > 5 minutes AND [2] age > 91 Windle Guard, RN, Olin Hauser 12/15/2018 9:42:44 AM Disp. Time Eilene Ghazi Time) Disposition Final User 12/15/2018 9:38:39 AM Send to Urgent Queue Baruch Goldmann 12/15/2018 9:52:47 AM 911 Outcome Documentation Conner, RN, Olin Hauser PLEASE NOTE: All timestamps contained within this report are represented as [place] Standard Time. CONFIDENTIALTY NOTICE: This fax transmission is intended only for the addressee. It contains information that is legally privileged, confidential or otherwise protected from use or disclosure. If you are not the intended recipient, you are strictly prohibited from reviewing, disclosing, copying using or disseminating any of this information or taking any action in reliance on or regarding this information. If you have received this fax in error, please notify us immediately by telephone so that we can arrange for its return to [place]. Phone: [phone], Toll-Free: [phone], Fax: [phone] Page: 2 of 2 Call Id: [id number] [place]. Time Eilene Ghazi Time) Disposition Final User Reason: EMS en route 12/15/2018 9:44:04 AM Call EMS 911 Now Yes Conner, RN, Otho Najjar Disagree/Comply Comply Caller Understands Yes PreDisposition Did not know what to do Care Advice Given Per Guideline CALL EMS 911 NOW: * Immediate medical attention is needed. You need to hang up and call 911 (or an ambulance). * Triager Discretion: I'll call you back in a few minutes to be sure you were able to reach them. CARE ADVICE given per Chest Pain (Adult) guideline.

## 2018-12-17 NOTE — Telephone Encounter (Signed)
I spoke with pt and EMS came and said no heart issues and was decided most likely GERD so pt did not go to ED; not having any problems today but pt would like referral to GI for further evaluation; pt request Dr Carlean Purl; pt saw him years ago. Pt request cb. ED precautions given and pt voiced understanding.

## 2018-12-17 NOTE — Telephone Encounter (Signed)
Please let her know that I put in the referral and she should hear from his office soon about an appointment

## 2018-12-17 NOTE — Telephone Encounter (Signed)
Left message on VM per DPR. 

## 2018-12-26 ENCOUNTER — Telehealth: Payer: Self-pay

## 2018-12-26 NOTE — Telephone Encounter (Signed)
That is okay.

## 2018-12-26 NOTE — Telephone Encounter (Signed)
Margarita Grizzle with [facility] left v/m that pharmacy is changing brands for levothyroxine and request cb to have Dr Alla German approval.Please advise.

## 2018-12-26 NOTE — Telephone Encounter (Signed)
Ariel Phillips with [facility] calling for verification if OK to change from Mylan to Allergen brand. Ariel Phillips notified as instructed and voiced understanding.

## 2019-01-04 ENCOUNTER — Telehealth: Payer: Self-pay

## 2019-01-04 NOTE — Telephone Encounter (Signed)
Pt is a resident at [facility] and on 01/03/19 had bad attack of GERD; pt rested well last night and when pt was going to eat breakfast could only take a bite of food and felt like going to vomit. Pt did not vomit but remains nauseated.pt thinks may have fever due to chills and feels like entire body is shaking but not visibly, more like a feeling inside pt is quivering. I asked pt if could get [facility] nurse to come and do assessment and takes pt temp and then could go to UC if needed. Pt was appreciative and will call nurse. FYI to R BaityNP.

## 2019-01-04 NOTE — Telephone Encounter (Signed)
Spoke to pt. She said the nurse has not called her back. She may go to UC.

## 2019-01-04 NOTE — Telephone Encounter (Signed)
That's fine, she doesn't need a verbal order to have the nurse come take her temp and assess her.

## 2019-01-10 DIAGNOSIS — H35371 Puckering of macula, right eye: Secondary | ICD-10-CM | POA: Diagnosis not present

## 2019-01-14 ENCOUNTER — Ambulatory Visit (INDEPENDENT_AMBULATORY_CARE_PROVIDER_SITE_OTHER): Payer: Medicare Other | Admitting: Internal Medicine

## 2019-01-14 ENCOUNTER — Encounter: Payer: Self-pay | Admitting: Internal Medicine

## 2019-01-14 ENCOUNTER — Other Ambulatory Visit: Payer: Self-pay

## 2019-01-14 VITALS — BP 124/80 | HR 68 | Temp 98.4°F | Ht 63.5 in | Wt 141.0 lb

## 2019-01-14 DIAGNOSIS — Z0000 Encounter for general adult medical examination without abnormal findings: Secondary | ICD-10-CM

## 2019-01-14 DIAGNOSIS — Z7189 Other specified counseling: Secondary | ICD-10-CM | POA: Diagnosis not present

## 2019-01-14 DIAGNOSIS — E039 Hypothyroidism, unspecified: Secondary | ICD-10-CM | POA: Diagnosis not present

## 2019-01-14 DIAGNOSIS — F39 Unspecified mood [affective] disorder: Secondary | ICD-10-CM | POA: Diagnosis not present

## 2019-01-14 DIAGNOSIS — I700 Atherosclerosis of aorta: Secondary | ICD-10-CM | POA: Diagnosis not present

## 2019-01-14 DIAGNOSIS — K219 Gastro-esophageal reflux disease without esophagitis: Secondary | ICD-10-CM

## 2019-01-14 DIAGNOSIS — Z23 Encounter for immunization: Secondary | ICD-10-CM | POA: Diagnosis not present

## 2019-01-14 DIAGNOSIS — R413 Other amnesia: Secondary | ICD-10-CM | POA: Diagnosis not present

## 2019-01-14 NOTE — Progress Notes (Signed)
Subjective:    Patient ID: Ariel Phillips, female    DOB: Sep 05, 1932, 85 y.o.   MRN: [id number]  HPI Here for Medicare wellness and follow up of chronic health conditions Reviewed form and advanced directives Reviewed other doctors Occasional drink of alcohol--rare now No tobacco Hasn't really kept up with exercise--plans to step it up No falls  Had lots of GERD in the past--then went away Had the spell causing the ER visit Did recur once since starting the omeprazole--but went away Always seems to occur on empty stomach--will get "very uncomfortable" Uses generic mylanta and it helps No symptoms for 10 days Not related to eating Does have appointment with Dr Carlean Purl   Several basal cell carcinomas in the past Has some spots on left temple and back --she wants checked Had appt with Dr Kellie Moor with telemediciine (but they went away then)  "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 carboxymethylcellulose (REFRESH PLUS) 0.5 % SOLN Place 2 drops into both eyes daily as needed (dry eyes).     Marland Kitchen EPINEPHrine 0.3 mg/0.3 mL IJ SOAJ injection Inject 0.3 mg into the muscle once. Use prn, takes allergy shots and has this on hand for adverse reactions    . levothyroxine (SYNTHROID) 75 MCG tablet TAKE 1 TABLET BY MOUTH DAILY BEFORE BREAKFAST 90 tablet 1  . loratadine (CLARITIN) 10 MG tablet Take 10 mg by mouth daily. Alternate with Allegra    . omeprazole (PRILOSEC) 20 MG capsule Take 1 capsule  (20 mg total) by mouth at bedtime. 90 capsule 3   No current facility-administered medications on file prior to visit.     Allergies  Allergen Reactions  . Penicillins Diarrhea  . Latex Rash    Past Medical History:  Diagnosis Date  . Allergic asthma   . Allergic rhinitis due to pollen   . Allergy   . GERD (gastroesophageal reflux disease)   . IBS (irritable bowel syndrome)   . Osteopenia   . Unspecified hypothyroidism     Past Surgical History:  Procedure Laterality Date  . ANAL FISSURE REPAIR  1950's  . BASAL CELL CARCINOMA EXCISION  2000's   nose  . CHOLECYSTECTOMY    . COLONOSCOPY  Multiple   Negative screening exams  . [place] or so   with fissure repair  . TUBAL LIGATION      Family History  Problem Relation Age of Onset  . COPD Mother   . Cancer Mother        unclear diagnosis  . Transient ischemic attack Mother   . Diabetes Sister   . Hypertension Sister   . Stroke Other   . Cancer Paternal Grandmother        ?breast  . Heart disease Neg Hx     Social History   Socioeconomic History  . Marital status: Divorced    Spouse name: Not on file  . Number of children: 1  . Years of education: Not on file  .  Highest education level: Not on file  Occupational History  . Occupation: Retired Personnel officer  Social Needs  . Financial resource strain: Not on file  . Food insecurity    Worry: Not on file    Inability: Not on file  . Transportation needs    Medical: Not on file    Non-medical: Not on file  Tobacco Use  . Smoking status: Never Smoker  . Smokeless tobacco: Never Used  Substance and Sexual Activity  . Alcohol use: Yes    Comment: rare wine  . Drug use: No  . Sexual activity: Not on file  Lifestyle  . Physical activity    Days per week: Not on file    Minutes per session: Not on file  . Stress: Not on file  Relationships  . Social Herbalist on phone: Not on file    Gets together: Not on file     Attends religious service: Not on file    Active member of club or organization: Not on file    Attends meetings of clubs or organizations: Not on file    Relationship status: Not on file  . Intimate partner violence    Fear of current or ex partner: Not on file    Emotionally abused: Not on file    Physically abused: Not on file    Forced sexual activity: Not on file  Other Topics Concern  . Not on file  Social History Narrative   Divorced and retired , moved from [place], [place]   1 Daughter in [place]   Has living will   Daughter has health care POA.---Ariel Phillips   Requests DNR--order done 06/19/12   Would not want prolonged mechanical ventilation   May accept tube feeds temporarily but not if cognitively unaware   Review of Systems Did do well with the PT for her neck---improved Appetite is fine Weight is up a little Sleeps well--more than she used to Wears seat belt Teeth are okay now--still has a sensitive root canal/crown spot Bowels not as bad--able to cope with IBS changes No chest pain or palpitations No SOB No edema No dysuria or hematuria. She has chronic leakage--wears depends (mostly urge related) Done with allergy immunotherapy (from Dr Tami Ribas). Uses claritin or allegra daily    Objective:   Physical Exam  Constitutional: She is oriented to person, place, and time. She appears well-developed. No distress.  HENT:  Mouth/Throat: Oropharynx is clear and moist. No oropharyngeal exudate.  Neck: No thyromegaly present.  Cardiovascular: Normal rate, regular rhythm, normal heart sounds and intact distal pulses. Exam reveals no gallop.  No murmur heard. Frequent extra beats  Respiratory: Effort normal and breath sounds normal. No respiratory distress. She has no wheezes. She has no rales.  GI: Soft. There is no abdominal tenderness.  Musculoskeletal:        General: No tenderness or edema.  Lymphadenopathy:    She has no cervical adenopathy.   Neurological: She is alert and oriented to person, place, and time.  President--- "Milinda Pointer, Obama, ?" [id number] Recall 3/3  Skin:  seb keratosis on back and left temple  Psychiatric: She has a normal mood and affect. Her behavior is normal.           Assessment & Plan:

## 2019-01-14 NOTE — Assessment & Plan Note (Signed)
I have personally reviewed the Medicare Annual Wellness questionnaire and have noted 1. The patient's medical and social history 2. Their use of alcohol, tobacco or illicit drugs 3. Their current medications and supplements 4. The patient's functional ability including ADL's, fall risks, home safety risks and hearing or visual             impairment. 5. Diet and physical activities 6. Evidence for depression or mood disorders  The patients weight, height, BMI and visual acuity have been recorded in the chart I have made referrals, counseling and provided education to the patient based review of the above and I have provided the pt with a written personalized care plan for preventive services.  I have provided you with a copy of your personalized plan for preventive services. Please take the time to review along with your updated medication list.  Flu vaccine today No cancer screening due to age Discussed exercise

## 2019-01-14 NOTE — Assessment & Plan Note (Signed)
Mild issues seem better now No longer sees counselor Typical COVID related stress

## 2019-01-14 NOTE — Assessment & Plan Note (Signed)
Will check labs

## 2019-01-14 NOTE — Assessment & Plan Note (Signed)
Seen on recent CT scan Nothing to suggest active vascular disease No action for now

## 2019-01-14 NOTE — Assessment & Plan Note (Signed)
Objectively seems stable No functional problems Will just check B12

## 2019-01-14 NOTE — Assessment & Plan Note (Signed)
Pain on empty stomach is concerning for ulcer Is going to see Dr Carlean Purl

## 2019-01-15 LAB — T4, FREE: Free T4: 0.89 ng/dL (ref 0.60–1.60)

## 2019-01-15 LAB — TSH: TSH: 4.54 u[IU]/mL — ABNORMAL HIGH (ref 0.35–4.50)

## 2019-01-15 LAB — VITAMIN B12: Vitamin B-12: 308 pg/mL (ref 211–911)

## 2019-01-17 ENCOUNTER — Encounter: Payer: Self-pay | Admitting: Internal Medicine

## 2019-01-17 ENCOUNTER — Other Ambulatory Visit: Payer: Self-pay

## 2019-01-17 ENCOUNTER — Other Ambulatory Visit (INDEPENDENT_AMBULATORY_CARE_PROVIDER_SITE_OTHER): Payer: Medicare Other

## 2019-01-17 ENCOUNTER — Ambulatory Visit (INDEPENDENT_AMBULATORY_CARE_PROVIDER_SITE_OTHER): Payer: Medicare Other | Admitting: Internal Medicine

## 2019-01-17 VITALS — BP 114/70 | HR 66 | Temp 97.4°F | Ht 63.0 in | Wt 141.0 lb

## 2019-01-17 DIAGNOSIS — R1013 Epigastric pain: Secondary | ICD-10-CM | POA: Diagnosis not present

## 2019-01-17 LAB — HEPATIC FUNCTION PANEL
ALT: 24 U/L (ref 0–35)
AST: 19 U/L (ref 0–37)
Albumin: 4.2 g/dL (ref 3.5–5.2)
Alkaline Phosphatase: 102 U/L (ref 39–117)
Bilirubin, Direct: 0.1 mg/dL (ref 0.0–0.3)
Total Bilirubin: 0.6 mg/dL (ref 0.2–1.2)
Total Protein: 7.3 g/dL (ref 6.0–8.3)

## 2019-01-17 LAB — LIPASE: Lipase: 14 U/L (ref 11.0–59.0)

## 2019-01-17 NOTE — H&P (View-Only) (Signed)
Ariel Phillips 85 y.o. 20-Mar-1933 [id number]  Assessment & Plan:   Encounter Diagnosis  Name Primary?  . Abdominal pain, epigastric Yes    ? Biliary ion origin  We were not sure if she had had a cholecystectomy - she called back and stated it was removed 2008. Orders Placed This Encounter  Procedures  . US Abdomen Complete  . Hepatic function panel  . Lipase    Labs were WNL  If [place] negative consder EGD pending clinical course   Subjective:   Chief Complaint: epigastric pain  HPI Ariel Phillips is here with complaints of episodic epigastric pressure this happened about twice in a severe form and mildly otherwise.  Sometimes the pain radiates out into the left and right upper quadrants.  On one episode Mylanta helped some.  They last for minutes not hours I believe.  She does have a history of irritable bowel syndrome but does not recall having problems like this.  She is has a cholecystectomy listed on her history but does not remember having a cholecystectomy.  She went to the emergency department on July 30 for 1 of these episodes and a CT Angie of the chest was negative I do not see a hiatal hernia on review of the images or the report.  She did have a small pericardial effusion and some bronchitic changes.  Her d-dimer was slightly high.  She had negative troponins and a negative EKG.CBC and CMET were normal  No associated breathing difficulty sweating nausea or vomiting.  No dysphagia.  Colonoscopy in 2018 with an 8 mm transverse colon hyperplastic polyp otherwise negative that was done for change in bowel habits. Allergies  Allergen Reactions  . Penicillins Diarrhea  . Latex Rash   Current Meds  Medication Sig  . albuterol (VENTOLIN HFA) 108 (90 Base) MCG/ACT inhaler Inhale 2 puffs into the lungs every 6 (six) hours as needed for wheezing or shortness of breath.  Marland Kitchen alum & mag hydroxide-simeth (MYLANTA MAXIMUM STRENGTH) 400-400-40 MG/5ML suspension Take by mouth  every 6 (six) hours as needed for indigestion. 2 tsp as needed for reflux  . carboxymethylcellulose (REFRESH PLUS) 0.5 % SOLN Place 2 drops into both eyes daily as needed (dry eyes).   Marland Kitchen EPINEPHrine 0.3 mg/0.3 mL IJ SOAJ injection Inject 0.3 mg into the muscle once. Use prn, takes allergy shots and has this on hand for adverse reactions  . levothyroxine (SYNTHROID) 75 MCG tablet TAKE 1 TABLET BY MOUTH DAILY BEFORE BREAKFAST  . loratadine (CLARITIN) 10 MG tablet Take 10 mg by mouth daily. Alternate with Allegra  . omeprazole (PRILOSEC) 20 MG capsule Take 1 capsule (20 mg total) by mouth at bedtime.   Past Medical History:  Diagnosis Date  . Allergic asthma   . Allergic rhinitis due to pollen   . Allergy   . GERD (gastroesophageal reflux disease)   . IBS (irritable bowel syndrome)   . Osteopenia   . Unspecified hypothyroidism    Past Surgical History:  Procedure Laterality Date  . ANAL FISSURE REPAIR  1950's  . BASAL CELL CARCINOMA EXCISION  2000's   nose  . CHOLECYSTECTOMY    . COLONOSCOPY  Multiple   Negative screening exams  . [place] or so   with fissure repair  . TUBAL LIGATION     Social History   Social History Narrative   Divorced and retired , moved from [place], [place]   1 Daughter in [place] living  will   Daughter has health care POA.---[name]   Requests DNR--order done 06/19/12   Would not want prolonged mechanical ventilation   May accept tube feeds temporarily but not if cognitively unaware   family history includes COPD in her mother; Cancer in her mother and paternal grandmother; Diabetes in her sister; Hypertension in her sister; Stroke in an other family member; Transient ischemic attack in her mother.   Review of Systems As per HPI  Objective:   Physical Exam @BP  114/70   Pulse 66   Temp (!) 97.4 F (36.3 C) (Temporal)   Ht 5\' 3"  (1.6 m)   Wt 141 lb (64 kg)   BMI 24.98 kg/m @  General:  NAD Eyes:    anicteric Lungs:  clear chest wall nontender heart::  S1S2 no rubs, murmurs or gallops Abdomen:  soft and nontender, BS+ Ext:   no edema, cyanosis or clubbing    Data Reviewed:  See HPI

## 2019-01-17 NOTE — Patient Instructions (Signed)
Your provider has requested that you go to the basement level for lab work before leaving today. Press "B" on the elevator. The lab is located at the first door on the left as you exit the elevator.   You have been scheduled for an abdominal ultrasound at [facility] on 01/25/2019 at 8:00AM. Please arrive 15 minutes prior to your appointment for registration. Make certain not to have anything to eat or drink 6 hours prior to your appointment. Should you need to reschedule your appointment, please contact radiology at [phone]. This test typically takes about 30 minutes to perform.   I appreciate the opportunity to care for you. Silvano Rusk, MD, [facility]

## 2019-01-17 NOTE — Progress Notes (Signed)
Ariel Phillips 85 y.o. 1933/01/02 [id number]  Assessment & Plan:   Encounter Diagnosis  Name Primary?  . Abdominal pain, epigastric Yes    ? Biliary ion origin  We were not sure if she had had a cholecystectomy - she called back and stated it was removed 2008. Orders Placed This Encounter  Procedures  . US Abdomen Complete  . Hepatic function panel  . Lipase    Labs were WNL  If [place] negative consder EGD pending clinical course   Subjective:   Chief Complaint: epigastric pain  HPI Ariel Phillips is here with complaints of episodic epigastric pressure this happened about twice in a severe form and mildly otherwise.  Sometimes the pain radiates out into the left and right upper quadrants.  On one episode Mylanta helped some.  They last for minutes not hours I believe.  She does have a history of irritable bowel syndrome but does not recall having problems like this.  She is has a cholecystectomy listed on her history but does not remember having a cholecystectomy.  She went to the emergency department on July 30 for 1 of these episodes and a CT Angie of the chest was negative I do not see a hiatal hernia on review of the images or the report.  She did have a small pericardial effusion and some bronchitic changes.  Her d-dimer was slightly high.  She had negative troponins and a negative EKG.CBC and CMET were normal  No associated breathing difficulty sweating nausea or vomiting.  No dysphagia.  Colonoscopy in 2018 with an 8 mm transverse colon hyperplastic polyp otherwise negative that was done for change in bowel habits. Allergies  Allergen Reactions  . Penicillins Diarrhea  . Latex Rash   Current Meds  Medication Sig  . albuterol (VENTOLIN HFA) 108 (90 Base) MCG/ACT inhaler Inhale 2 puffs into the lungs every 6 (six) hours as needed for wheezing or shortness of breath.  Marland Kitchen alum & mag hydroxide-simeth (MYLANTA MAXIMUM STRENGTH) 400-400-40 MG/5ML suspension Take by mouth  every 6 (six) hours as needed for indigestion. 2 tsp as needed for reflux  . carboxymethylcellulose (REFRESH PLUS) 0.5 % SOLN Place 2 drops into both eyes daily as needed (dry eyes).   Marland Kitchen EPINEPHrine 0.3 mg/0.3 mL IJ SOAJ injection Inject 0.3 mg into the muscle once. Use prn, takes allergy shots and has this on hand for adverse reactions  . levothyroxine (SYNTHROID) 75 MCG tablet TAKE 1 TABLET BY MOUTH DAILY BEFORE BREAKFAST  . loratadine (CLARITIN) 10 MG tablet Take 10 mg by mouth daily. Alternate with Allegra  . omeprazole (PRILOSEC) 20 MG capsule Take 1 capsule (20 mg total) by mouth at bedtime.   Past Medical History:  Diagnosis Date  . Allergic asthma   . Allergic rhinitis due to pollen   . Allergy   . GERD (gastroesophageal reflux disease)   . IBS (irritable bowel syndrome)   . Osteopenia   . Unspecified hypothyroidism    Past Surgical History:  Procedure Laterality Date  . ANAL FISSURE REPAIR  1950's  . BASAL CELL CARCINOMA EXCISION  2000's   nose  . CHOLECYSTECTOMY    . COLONOSCOPY  Multiple   Negative screening exams  . [place] or so   with fissure repair  . TUBAL LIGATION     Social History   Social History Narrative   Divorced and retired , moved from [place], [place]   1 Daughter in [place] living  will   Daughter has health care POA.---[name]   Requests DNR--order done 06/19/12   Would not want prolonged mechanical ventilation   May accept tube feeds temporarily but not if cognitively unaware   family history includes COPD in her mother; Cancer in her mother and paternal grandmother; Diabetes in her sister; Hypertension in her sister; Stroke in an other family member; Transient ischemic attack in her mother.   Review of Systems As per HPI  Objective:   Physical Exam @BP  114/70   Pulse 66   Temp (!) 97.4 F (36.3 C) (Temporal)   Ht 5\' 3"  (1.6 m)   Wt 141 lb (64 kg)   BMI 24.98 kg/m @  General:  NAD Eyes:    anicteric Lungs:  clear chest wall nontender heart::  S1S2 no rubs, murmurs or gallops Abdomen:  soft and nontender, BS+ Ext:   no edema, cyanosis or clubbing    Data Reviewed:  See HPI

## 2019-01-18 ENCOUNTER — Encounter: Payer: Self-pay | Admitting: Internal Medicine

## 2019-01-18 ENCOUNTER — Telehealth: Payer: Self-pay | Admitting: Internal Medicine

## 2019-01-18 NOTE — Telephone Encounter (Signed)
Surgical hx updated Dr. Lorelei Pont

## 2019-01-25 ENCOUNTER — Ambulatory Visit
Admission: RE | Admit: 2019-01-25 | Discharge: 2019-01-25 | Disposition: A | Discharge status: Home / Self Care | Payer: Medicare Other | Source: Ambulatory Visit | Attending: Internal Medicine | Admitting: Internal Medicine

## 2019-01-25 DIAGNOSIS — R1013 Epigastric pain: Secondary | ICD-10-CM

## 2019-01-25 DIAGNOSIS — K7689 Other specified diseases of liver: Secondary | ICD-10-CM | POA: Diagnosis not present

## 2019-01-30 ENCOUNTER — Other Ambulatory Visit: Payer: Self-pay

## 2019-01-30 DIAGNOSIS — K8050 Calculus of bile duct without cholangitis or cholecystitis without obstruction: Secondary | ICD-10-CM

## 2019-01-30 DIAGNOSIS — R1013 Epigastric pain: Secondary | ICD-10-CM

## 2019-01-30 NOTE — Progress Notes (Signed)
Please tell her there appears to be a stone in the bile duct and there is a liver lesion most likely a benign cyst but we need her to do an MR/MRCP liver with and without  contrast   She will need a BUN and creatinine  If it is a stone I can get that out with an ERCP

## 2019-02-04 ENCOUNTER — Other Ambulatory Visit: Payer: Self-pay | Admitting: Internal Medicine

## 2019-02-04 ENCOUNTER — Other Ambulatory Visit: Payer: Self-pay

## 2019-02-04 ENCOUNTER — Ambulatory Visit (HOSPITAL_COMMUNITY)
Admission: RE | Admit: 2019-02-04 | Discharge: 2019-02-04 | Disposition: A | Discharge status: Home / Self Care | Payer: Medicare Other | Source: Ambulatory Visit | Attending: Internal Medicine | Admitting: Internal Medicine

## 2019-02-04 ENCOUNTER — Other Ambulatory Visit (INDEPENDENT_AMBULATORY_CARE_PROVIDER_SITE_OTHER): Payer: Medicare Other

## 2019-02-04 DIAGNOSIS — R1013 Epigastric pain: Secondary | ICD-10-CM

## 2019-02-04 DIAGNOSIS — K8050 Calculus of bile duct without cholangitis or cholecystitis without obstruction: Secondary | ICD-10-CM

## 2019-02-04 DIAGNOSIS — K838 Other specified diseases of biliary tract: Secondary | ICD-10-CM | POA: Diagnosis not present

## 2019-02-04 LAB — BUN: BUN: 21 mg/dL (ref 6–23)

## 2019-02-04 LAB — CREATININE, SERUM: Creatinine, Ser: 0.57 mg/dL (ref 0.40–1.20)

## 2019-02-04 MED ORDER — GADOBUTROL 1 MMOL/ML IV SOLN
7.5000 mL | Freq: Once | INTRAVENOUS | Status: AC | PRN
  Administered 2019-02-04: 18:00:00 7.5 mL via INTRAVENOUS

## 2019-02-05 ENCOUNTER — Ambulatory Visit (INDEPENDENT_AMBULATORY_CARE_PROVIDER_SITE_OTHER): Payer: Medicare Other | Admitting: Nurse Practitioner

## 2019-02-05 ENCOUNTER — Encounter: Payer: Self-pay | Admitting: Nurse Practitioner

## 2019-02-05 ENCOUNTER — Other Ambulatory Visit: Payer: Self-pay

## 2019-02-05 VITALS — BP 118/70 | HR 86 | Temp 97.8°F | Ht 63.0 in | Wt 141.0 lb

## 2019-02-05 DIAGNOSIS — Z7189 Other specified counseling: Secondary | ICD-10-CM

## 2019-02-05 NOTE — Progress Notes (Signed)
Careteam: Patient Care Team: Venia Carbon, MD as PCP - General (Pediatrics)  Advanced Directive information Does Patient Have a Medical Advance Directive?: Yes, Type of Advance Directive: [place];Out of facility DNR (pink MOST or yellow form), Pre-existing out of facility DNR order (yellow form or pink MOST form): Yellow form placed in chart (order not valid for inpatient use);Pink MOST form placed in chart (order not valid for inpatient use), Does patient want to make changes to medical advance directive?: No - Patient declined  Allergies  Allergen Reactions  . Penicillins Diarrhea  . Latex Rash    Chief Complaint  Patient presents with  . Advanced Directive    Patient has questions about the MOST form     HPI: Patient is a 85 y.o. female in CLINIC at [facility].  Does not want to be placed on a ventilator under any instance would want to be on life support. Has hx of childhood asthma and does not like the idea of not being able to breath.  Has documents in regards to living will on file at [facility].  Most form was given to her by one of the nurses at [facility] and wanted to have it completed by a provider due to [place].    Past Medical History:  Diagnosis Date  . Allergic asthma   . Allergic rhinitis due to pollen   . Allergy   . GERD (gastroesophageal reflux disease)   . IBS (irritable bowel syndrome)   . Osteopenia   . Unspecified hypothyroidism    Past Surgical History:  Procedure Laterality Date  . ANAL FISSURE REPAIR  1950's  . BASAL CELL CARCINOMA EXCISION  2000's   nose  . CHOLECYSTECTOMY  2008  . COLONOSCOPY  Multiple   Negative screening exams  . [place] or so   with fissure repair  . TUBAL LIGATION     Social History:   reports that she has never smoked. She has never used smokeless tobacco. She reports current alcohol use. She reports that she does not use drugs.  Family History  Problem Relation Age  of Onset  . COPD Mother   . Cancer Mother        unclear diagnosis  . Transient ischemic attack Mother   . Diabetes Sister   . Hypertension Sister   . Stroke Other   . Cancer Paternal Grandmother        ?breast  . Heart disease Neg Hx     Medications: Patient's Medications  New Prescriptions   No medications on file  Previous Medications   ALUM & MAG HYDROXIDE-SIMETH (MYLANTA MAXIMUM STRENGTH) 400-400-40 MG/5ML SUSPENSION    Take by mouth as needed for indigestion. 2 tsp as needed for reflux    CARBOXYMETHYLCELLULOSE (REFRESH PLUS) 0.5 % SOLN    Place 2 drops into both eyes daily as needed (dry eyes).    EPINEPHRINE 0.3 MG/0.3 ML IJ SOAJ INJECTION    Inject 0.3 mg into the muscle once. Use prn, takes allergy shots and has this on hand for adverse reactions   LEVOTHYROXINE (SYNTHROID) 75 MCG TABLET    TAKE 1 TABLET BY MOUTH DAILY BEFORE BREAKFAST   LORATADINE (CLARITIN) 10 MG TABLET    Take 10 mg by mouth at bedtime. Alternate with Allegra   Modified Medications   No medications on file  Discontinued Medications   ALBUTEROL (VENTOLIN HFA) 108 (90 BASE) MCG/ACT INHALER    Inhale 2 puffs  into the lungs every 6 (six) hours as needed for wheezing or shortness of breath.   OMEPRAZOLE (PRILOSEC) 20 MG CAPSULE    Take 1 capsule (20 mg total) by mouth at bedtime.    Physical Exam:  Vitals:   02/05/19 1550  Pulse: 86  Temp: 97.8 F (36.6 C)  TempSrc: Oral  SpO2: 97%   There is no height or weight on file to calculate BMI. Wt Readings from Last 3 Encounters:  01/17/19 141 lb (64 kg)  01/14/19 141 lb (64 kg)  12/04/18 139 lb (63 kg)      Labs reviewed: Basic Metabolic Panel: Recent Labs    11/29/18 1122 01/14/19 1517 02/04/19 1514  NA 142  --   --   K 4.2  --   --   CL 108  --   --   CO2 27  --   --   GLUCOSE 100*  --   --   BUN 19  --  21  CREATININE 0.63  --  0.57  CALCIUM 9.3  --   --   TSH  --  4.54*  --    Liver Function Tests: Recent Labs    01/17/19  1209  AST 19  ALT 24  ALKPHOS 102  BILITOT 0.6  PROT 7.3  ALBUMIN 4.2   Recent Labs    01/17/19 1209  LIPASE 14.0   No results for input(s): AMMONIA in the last 8760 hours. CBC: Recent Labs    11/29/18 1122  WBC 6.3  HGB 13.9  HCT 42.3  MCV 94.8  PLT 179   Lipid Panel: No results for input(s): CHOL, HDL, LDLCALC, TRIG, CHOLHDL, LDLDIRECT in the last 8760 hours. TSH: Recent Labs    01/14/19 1517  TSH 4.54*   A1C: No results found for: HGBA1C   Assessment/Plan 1. Advanced directives, counseling/discussion - DNR (Do Not Resuscitate) -MOST form discussed and completed with pt -pt plans on bring HCPOA, living will and DNR documents to clinic so copy can be placed in [place]. [place], [facility] [phone]

## 2019-02-05 NOTE — Progress Notes (Signed)
1) No liver lesion - good news 2) Needs an ERCP to remove the stone from CBD  I spoke to her and reviewed this and we want to aim for 130 PM 10/14 at [facility] please  She is waiting for [place] to call and set this up for sure and knows she will have to get a coronavirus test Please order cipro on call and indomethacin also

## 2019-02-06 ENCOUNTER — Telehealth: Payer: Self-pay | Admitting: Internal Medicine

## 2019-02-06 ENCOUNTER — Other Ambulatory Visit: Payer: Self-pay

## 2019-02-06 DIAGNOSIS — K8050 Calculus of bile duct without cholangitis or cholecystitis without obstruction: Secondary | ICD-10-CM

## 2019-02-06 NOTE — Telephone Encounter (Signed)
Patient notified of procedure.  See results notes on MRCP for additional details.

## 2019-02-09 ENCOUNTER — Other Ambulatory Visit (HOSPITAL_COMMUNITY)
Admission: RE | Admit: 2019-02-09 | Discharge: 2019-02-09 | Disposition: A | Discharge status: Home / Self Care | Payer: Medicare Other | Source: Ambulatory Visit | Attending: Internal Medicine | Admitting: Internal Medicine

## 2019-02-09 DIAGNOSIS — Z20828 Contact with and (suspected) exposure to other viral communicable diseases: Secondary | ICD-10-CM | POA: Insufficient documentation

## 2019-02-09 DIAGNOSIS — Z01812 Encounter for preprocedural laboratory examination: Secondary | ICD-10-CM | POA: Insufficient documentation

## 2019-02-10 LAB — NOVEL CORONAVIRUS, NAA (HOSP ORDER, SEND-OUT TO REF LAB; TAT 18-24 HRS): SARS-CoV-2, NAA: NOT DETECTED

## 2019-02-13 ENCOUNTER — Ambulatory Visit (HOSPITAL_COMMUNITY): Payer: Medicare Other

## 2019-02-13 ENCOUNTER — Ambulatory Visit (HOSPITAL_COMMUNITY)
Admission: RE | Admit: 2019-02-13 | Discharge: 2019-02-13 | Disposition: A | Discharge status: Home / Self Care | Payer: Medicare Other | Attending: Internal Medicine | Admitting: Internal Medicine

## 2019-02-13 ENCOUNTER — Ambulatory Visit (HOSPITAL_COMMUNITY): Payer: Medicare Other | Admitting: Certified Registered Nurse Anesthetist

## 2019-02-13 ENCOUNTER — Encounter (HOSPITAL_COMMUNITY)
Admission: RE | Disposition: A | Discharge status: Home / Self Care | Payer: Self-pay | Source: Home / Self Care | Attending: Internal Medicine

## 2019-02-13 ENCOUNTER — Encounter (HOSPITAL_COMMUNITY): Payer: Self-pay | Admitting: *Deleted

## 2019-02-13 DIAGNOSIS — Z79890 Hormone replacement therapy (postmenopausal): Secondary | ICD-10-CM | POA: Insufficient documentation

## 2019-02-13 DIAGNOSIS — Z85828 Personal history of other malignant neoplasm of skin: Secondary | ICD-10-CM | POA: Diagnosis not present

## 2019-02-13 DIAGNOSIS — E039 Hypothyroidism, unspecified: Secondary | ICD-10-CM | POA: Insufficient documentation

## 2019-02-13 DIAGNOSIS — J45909 Unspecified asthma, uncomplicated: Secondary | ICD-10-CM | POA: Diagnosis not present

## 2019-02-13 DIAGNOSIS — K219 Gastro-esophageal reflux disease without esophagitis: Secondary | ICD-10-CM | POA: Diagnosis not present

## 2019-02-13 DIAGNOSIS — K8050 Calculus of bile duct without cholangitis or cholecystitis without obstruction: Secondary | ICD-10-CM | POA: Diagnosis not present

## 2019-02-13 DIAGNOSIS — K838 Other specified diseases of biliary tract: Secondary | ICD-10-CM | POA: Diagnosis not present

## 2019-02-13 DIAGNOSIS — K5710 Diverticulosis of small intestine without perforation or abscess without bleeding: Secondary | ICD-10-CM | POA: Insufficient documentation

## 2019-02-13 HISTORY — PX: SPHINCTEROTOMY: SHX5544

## 2019-02-13 HISTORY — PX: ENDOSCOPIC RETROGRADE CHOLANGIOPANCREATOGRAPHY (ERCP) WITH PROPOFOL: SHX5810

## 2019-02-13 HISTORY — PX: REMOVAL OF STONES: SHX5545

## 2019-02-13 SURGERY — ENDOSCOPIC RETROGRADE CHOLANGIOPANCREATOGRAPHY (ERCP) WITH PROPOFOL
Anesthesia: General

## 2019-02-13 MED ORDER — FENTANYL CITRATE (PF) 100 MCG/2ML IJ SOLN
INTRAMUSCULAR | Status: DC | PRN
  Administered 2019-02-13: 75 ug via INTRAVENOUS

## 2019-02-13 MED ORDER — LACTATED RINGERS IV SOLN
INTRAVENOUS | Status: DC
  Administered 2019-02-13: 1000 mL via INTRAVENOUS

## 2019-02-13 MED ORDER — LACTATED RINGERS IV SOLN
INTRAVENOUS | Status: DC | PRN
  Administered 2019-02-13: 14:00:00 via INTRAVENOUS

## 2019-02-13 MED ORDER — SUGAMMADEX SODIUM 200 MG/2ML IV SOLN
INTRAVENOUS | Status: DC | PRN
  Administered 2019-02-13: 200 mg via INTRAVENOUS

## 2019-02-13 MED ORDER — ONDANSETRON HCL 4 MG/2ML IJ SOLN
INTRAMUSCULAR | Status: DC | PRN
  Administered 2019-02-13: 4 mg via INTRAVENOUS

## 2019-02-13 MED ORDER — LIDOCAINE HCL 1 % IJ SOLN
INTRAMUSCULAR | Status: DC | PRN
  Administered 2019-02-13: 60 mg via INTRADERMAL

## 2019-02-13 MED ORDER — SODIUM CHLORIDE 0.9 % IV SOLN
INTRAVENOUS | Status: DC | PRN
  Administered 2019-02-13: 30 mL

## 2019-02-13 MED ORDER — INDOMETHACIN 50 MG RE SUPP
RECTAL | Status: DC | PRN
  Administered 2019-02-13: 100 mg via RECTAL

## 2019-02-13 MED ORDER — INDOMETHACIN 50 MG RE SUPP
RECTAL | Status: AC
  Filled 2019-02-13: qty 2

## 2019-02-13 MED ORDER — ROCURONIUM BROMIDE 100 MG/10ML IV SOLN
INTRAVENOUS | Status: DC | PRN
  Administered 2019-02-13: 50 mg via INTRAVENOUS

## 2019-02-13 MED ORDER — PHENYLEPHRINE 40 MCG/ML (10ML) SYRINGE FOR IV PUSH (FOR BLOOD PRESSURE SUPPORT)
PREFILLED_SYRINGE | INTRAVENOUS | Status: DC | PRN
  Administered 2019-02-13: 120 ug via INTRAVENOUS
  Administered 2019-02-13: 80 ug via INTRAVENOUS

## 2019-02-13 MED ORDER — CIPROFLOXACIN IN D5W 400 MG/200ML IV SOLN
INTRAVENOUS | Status: AC
  Filled 2019-02-13: qty 200

## 2019-02-13 MED ORDER — INDOMETHACIN 50 MG RE SUPP: 100.0000 mg | Freq: Once | RECTAL | Status: DC

## 2019-02-13 MED ORDER — GLUCAGON HCL RDNA (DIAGNOSTIC) 1 MG IJ SOLR
INTRAMUSCULAR | Status: AC
  Filled 2019-02-13: qty 1

## 2019-02-13 MED ORDER — FENTANYL CITRATE (PF) 100 MCG/2ML IJ SOLN
INTRAMUSCULAR | Status: AC
  Filled 2019-02-13: qty 2

## 2019-02-13 MED ORDER — CIPROFLOXACIN IN D5W 400 MG/200ML IV SOLN
400.0000 mg | Freq: Once | INTRAVENOUS | Status: AC
  Administered 2019-02-13: 400 mg via INTRAVENOUS

## 2019-02-13 MED ORDER — PROPOFOL 10 MG/ML IV BOLUS
INTRAVENOUS | Status: DC | PRN
  Administered 2019-02-13: 130 mg via INTRAVENOUS

## 2019-02-13 MED ORDER — SODIUM CHLORIDE 0.9 % IV SOLN: INTRAVENOUS | Status: DC

## 2019-02-13 MED ORDER — PROPOFOL 10 MG/ML IV BOLUS
INTRAVENOUS | Status: AC
  Filled 2019-02-13: qty 20

## 2019-02-13 NOTE — Transfer of Care (Signed)
Immediate Anesthesia Transfer of Care Note  Patient: Ariel Phillips  Procedure(s) Performed: ENDOSCOPIC RETROGRADE CHOLANGIOPANCREATOGRAPHY (ERCP) WITH PROPOFOL (N/A ) SPHINCTEROTOMY REMOVAL OF STONES  Patient Location: PACU and Endoscopy Unit  Anesthesia Type:General  Level of Consciousness: awake, alert  and oriented  Airway & Oxygen Therapy: Patient Spontanous Breathing and Patient connected to face mask oxygen  Post-op Assessment: Report given to RN and Post -op Vital signs reviewed and stable  Post vital signs: Reviewed and stable  Last Vitals:  Vitals Value Taken Time  BP 161/84 02/13/19 1523  Temp 36.5 C 02/13/19 1523  Pulse 66 02/13/19 1526  Resp 13 02/13/19 1526  SpO2 100 % 02/13/19 1526  Vitals shown include unvalidated device data.  Last Pain:  Vitals:   02/13/19 1523  TempSrc: Temporal  PainSc: 0-No pain         Complications: No apparent anesthesia complications

## 2019-02-13 NOTE — Anesthesia Procedure Notes (Signed)
Procedure Name: Intubation Date/Time: 02/13/2019 2:17 PM Performed by: [place] Madox Corkins C, CRNA Pre-anesthesia Checklist: Patient identified, Emergency Drugs available, Suction available and Patient being monitored Patient Re-evaluated:Patient Re-evaluated prior to induction Oxygen Delivery Method: Circle system utilized Preoxygenation: Pre-oxygenation with 100% oxygen Induction Type: IV induction Ventilation: Mask ventilation without difficulty Laryngoscope Size: Mac and 3 Grade View: Grade I Tube type: Oral Tube size: 7.0 mm Number of attempts: 1 Airway Equipment and Method: Stylet and Oral airway Placement Confirmation: ETT inserted through vocal cords under direct vision,  positive ETCO2 and breath sounds checked- equal and bilateral Secured at: 20 cm Tube secured with: Tape Dental Injury: Teeth and Oropharynx as per pre-operative assessment

## 2019-02-13 NOTE — Anesthesia Preprocedure Evaluation (Signed)
Anesthesia Evaluation  Patient identified by MRN, date of birth, ID band Patient awake    Reviewed: Allergy & Precautions, H&P , NPO status , Patient's Chart, lab work & pertinent test results  Airway Mallampati: II   Neck ROM: full    Dental   Pulmonary asthma ,    breath sounds clear to auscultation       Cardiovascular negative cardio ROS   Rhythm:regular Rate:Normal     Neuro/Psych    GI/Hepatic GERD  ,  Endo/Other  Hypothyroidism   Renal/GU      Musculoskeletal   Abdominal   Peds  Hematology   Anesthesia Other Findings   Reproductive/Obstetrics                             Anesthesia Physical Anesthesia Plan  ASA: II  Anesthesia Plan: General   Post-op Pain Management:    Induction: Intravenous  PONV Risk Score and Plan: 3 and Ondansetron, Dexamethasone and Treatment may vary due to age or medical condition  Airway Management Planned: Oral ETT  Additional Equipment:   Intra-op Plan:   Post-operative Plan: Extubation in OR  Informed Consent: I have reviewed the patients History and Physical, chart, labs and discussed the procedure including the risks, benefits and alternatives for the proposed anesthesia with the patient or authorized representative who has indicated his/her understanding and acceptance.       Plan Discussed with: CRNA, Anesthesiologist and Surgeon  Anesthesia Plan Comments:         Anesthesia Quick Evaluation

## 2019-02-13 NOTE — Discharge Instructions (Addendum)
I successfully removed the stone. The episodic abdominal pain should stop.   You will ease back into eating - see diet below.  Normal to be a bit sore today - any severe pain, vomiting, fever - call the office number.  YOU HAD AN ENDOSCOPIC PROCEDURE TODAY: Refer to the procedure report and other information in the discharge instructions given to you for any specific questions about what was found during the examination. If this information does not answer your questions, please call Dr. Celesta Aver office at [phone] to clarify.   YOU SHOULD EXPECT: Some feelings of bloating in the abdomen. Passage of more gas than usual. Walking can help get rid of the air that was put into your GI tract during the procedure and reduce the bloating. If you had a lower endoscopy (such as a colonoscopy or flexible sigmoidoscopy) you may notice spotting of blood in your stool or on the toilet paper. Some abdominal soreness may be present for a day or two, also.  DIET:   Clear liquids until 5 PM then soup is ok - not creamy Regular food tomorrow if you feel ok  ACTIVITY: Your care partner should take you home directly after the procedure. You should plan to take it easy, moving slowly for the rest of the day. You can resume normal activity the day after the procedure however YOU SHOULD NOT DRIVE, use power tools, machinery or perform tasks that involve climbing or major physical exertion for 24 hours (because of the sedation medicines used during the test).   SYMPTOMS TO REPORT IMMEDIATELY: A gastroenterologist can be reached at any hour. Please call [phone]  for any of the following symptoms:   Fever of 100 or higher  Vomiting of blood or coffee ground material  New, significant abdominal pain  New, significant chest pain or pain under the shoulder blades  Painful or persistently difficult swallowing  New shortness of breath  Black, tarry-looking or red, bloody stools

## 2019-02-13 NOTE — Op Note (Signed)
[facility] Patient Name: Ariel Phillips Procedure Date: 02/13/2019 MRN: [id number] Attending MD: Gatha Mayer , MD Date of Birth: 05-03-1932 CSN: [id number] Age: 85 Admit Type: Outpatient Procedure:                ERCP Indications:              Bile duct stone(s) Providers:                Gatha Mayer, MD, Cleda Daub, RN, Elspeth Cho Tech., Technician, Stephanie [place] CRNA Referring MD:              Medicines:                General Anesthesia, Cipro [id number] mg IV Complications:            No immediate complications. Estimated Blood Loss:     Estimated blood loss was minimal. Procedure:                Pre-Anesthesia Assessment:                           - Prior to the procedure, a History and Physical                            was performed, and patient medications and                            allergies were reviewed. The patient's tolerance of                            previous anesthesia was also reviewed. The risks                            and benefits of the procedure and the sedation                            options and risks were discussed with the patient.                            All questions were answered, and informed consent                            was obtained. Prior Anticoagulants: The patient has                            taken no previous anticoagulant or antiplatelet                            agents. ASA Grade Assessment: II - A patient with                            mild systemic disease. After reviewing the risks  and benefits, the patient was deemed in                            satisfactory condition to undergo the procedure.                           After obtaining informed consent, the scope was                            passed under direct vision. Throughout the                            procedure, the patient's blood pressure, pulse, and                             oxygen saturations were monitored continuously. The                            [id number] Olympus duodenoscope was                            introduced through the mouth, and used to inject                            contrast into and used to inject contrast into the                            bile duct. The ERCP was accomplished without                            difficulty. The patient tolerated the procedure                            well. Scope In: Scope Out: Findings:      A scout film of the abdomen was obtained. Surgical clips, consistent       with a previous cholecystectomy, were seen in the area of the right       upper quadrant of the abdomen. The esophagus was successfully intubated       under direct vision. The scope was advanced to a normal major papilla in       the descending duodenum without detailed examination of the pharynx,       larynx and associated structures, and upper GI tract. The upper GI tract       was grossly normal. There was a normal papilla next to and inferior to a       medium duodenal diverticulum. The papilla and bile duct were easily       cannulated by wire and then injection of contrast showed that wire was       in cystic duct stump with low insertion. Adjustmens made and wire into       CBD./CHD. There was a 12 mm CBD/CHD with some intrahepatic dilation. I       could not see the stone I knew was there from MRCP. Biliary       sphincterotomy performed and after 3 extraction attempts  a polyhedral       dark pigmented large bile duct stone was removed and subsequent       occlusion cholangiogram and sweep were negative. The pancreas was not       entered - intentionally. Impression:               - Choledocholithiasis was found. Complete removal                            was accomplished by biliary sphincterotomy and                            balloon extraction.                           - moderate periampullary  diverticulum Moderate Sedation:      Not Applicable - Patient had care per Anesthesia. Recommendation:           - Patient has a contact number available for                            emergencies. The signs and symptoms of potential                            delayed complications were discussed with the                            patient. Return to normal activities tomorrow.                            Written discharge instructions were provided to the                            patient.                           - Advance diet as tolerated Start with clears today                            and advance tomorrow.                           - F/U me prn Procedure Code(s):        --- Professional ---                           [phone], Endoscopic retrograde                            cholangiopancreatography (ERCP); with removal of                            calculi/debris from biliary/pancreatic duct(s)                           43262, Endoscopic retrograde  cholangiopancreatography (ERCP); with                            sphincterotomy/papillotomy Diagnosis Code(s):        --- Professional ---                           K80.50, Calculus of bile duct without cholangitis                            or cholecystitis without obstruction CPT copyright 2019 American Medical Association. All rights reserved. The codes documented in this report are preliminary and upon coder review may  be revised to meet current compliance requirements. Gatha Mayer, MD 02/13/2019 3:09:21 PM This report has been signed electronically. Number of Addenda: 0

## 2019-02-13 NOTE — Interval H&P Note (Signed)
History and Physical Interval Note:  02/13/2019 12:57 PM  Ariel Phillips  has presented today for surgery, with the diagnosis of CBD stone.  The various methods of treatment have been discussed with the patient and family. After consideration of risks, benefits and other options for treatment, the patient has consented to  Procedure(s): ENDOSCOPIC RETROGRADE CHOLANGIOPANCREATOGRAPHY (ERCP) WITH PROPOFOL (N/A) as a surgical intervention.  The patient's history has been reviewed, patient examined, no change in status, stable for surgery.  I have reviewed the patient's chart and labs.  Questions were answered to the patient's satisfaction.     Silvano Rusk

## 2019-02-14 ENCOUNTER — Encounter (HOSPITAL_COMMUNITY): Payer: Self-pay | Admitting: Internal Medicine

## 2019-02-15 NOTE — Anesthesia Postprocedure Evaluation (Signed)
Anesthesia Post Note  Patient: Ariel Phillips  Procedure(s) Performed: ENDOSCOPIC RETROGRADE CHOLANGIOPANCREATOGRAPHY (ERCP) WITH PROPOFOL (N/A ) SPHINCTEROTOMY REMOVAL OF STONES     Patient location during evaluation: PACU Anesthesia Type: General Level of consciousness: awake and alert Pain management: pain level controlled Vital Signs Assessment: post-procedure vital signs reviewed and stable Respiratory status: spontaneous breathing, nonlabored ventilation, respiratory function stable and patient connected to nasal cannula oxygen Cardiovascular status: blood pressure returned to baseline and stable Postop Assessment: no apparent nausea or vomiting Anesthetic complications: no    Last Vitals:  Vitals:   02/13/19 1550 02/13/19 1600  BP: (!) 158/77 (!) 160/74  Pulse: 62 63  Resp: 17 (!) 21  Temp:    SpO2: 100% 100%    Last Pain:  Vitals:   02/13/19 1600  TempSrc:   PainSc: 0-No pain                 Jelisa  S

## 2019-03-07 ENCOUNTER — Encounter: Payer: Self-pay | Admitting: Nurse Practitioner

## 2019-03-07 ENCOUNTER — Other Ambulatory Visit: Payer: Self-pay

## 2019-03-07 ENCOUNTER — Ambulatory Visit: Payer: Medicare Other | Admitting: Nurse Practitioner

## 2019-03-07 VITALS — BP 118/70 | HR 73 | Temp 97.9°F | Ht 63.0 in | Wt 141.1 lb

## 2019-03-07 DIAGNOSIS — L700 Acne vulgaris: Secondary | ICD-10-CM | POA: Diagnosis not present

## 2019-03-07 DIAGNOSIS — L821 Other seborrheic keratosis: Secondary | ICD-10-CM | POA: Diagnosis not present

## 2019-03-07 NOTE — Progress Notes (Signed)
Careteam: Patient Care Team: Venia Carbon, MD as PCP - General (Pediatrics)  Advanced Directive information Does Patient Have a Medical Advance Directive?: Yes, Type of Advance Directive: [place];Living will;Out of facility DNR (pink MOST or yellow form), Pre-existing out of facility DNR order (yellow form or pink MOST form): Yellow form placed in chart (order not valid for inpatient use);Pink MOST form placed in chart (order not valid for inpatient use), Does patient want to make changes to medical advance directive?: No - Patient declined  Allergies  Allergen Reactions  . Penicillins Diarrhea    Did it involve swelling of the face/tongue/throat, SOB, or low BP? No Did it involve sudden or severe rash/hives, skin peeling, or any reaction on the inside of your mouth or nose? No Did you need to seek medical attention at a hospital or doctor's office? Yes When did it last happen?20+ years If all above answers are "NO", may proceed with cephalosporin use.   . Latex Rash    Chief Complaint  Patient presents with  . Acute Visit    Patient has spots on her face she is concerned about.     HPI: Patient is a 85 y.o. female seen in today at the [facility] for evaluation of spot on face.  Has had hx of AK in the past, 20 years ago and then one more recently.  Had a mohs procedure in [place] a few years ago on the bridge of her nose.  Spot on her face has been there a long time but more thick and rough recently. Does not like it and picks at it frequently.   Review of Systems:  Review of Systems  Constitutional: Negative for chills and fever.  Skin: Negative for itching and rash.       2 spots on left lateral forehead     Past Medical History:  Diagnosis Date  . Allergic asthma   . Allergic rhinitis due to pollen   . Allergy   . GERD (gastroesophageal reflux disease)   . IBS (irritable bowel syndrome)   . Osteopenia   . Unspecified  hypothyroidism    Past Surgical History:  Procedure Laterality Date  . ANAL FISSURE REPAIR  1950's  . BASAL CELL CARCINOMA EXCISION  2000's   nose  . CHOLECYSTECTOMY  2008  . COLONOSCOPY  Multiple   Negative screening exams  . ENDOSCOPIC RETROGRADE CHOLANGIOPANCREATOGRAPHY (ERCP) WITH PROPOFOL N/A 02/13/2019   Procedure: ENDOSCOPIC RETROGRADE CHOLANGIOPANCREATOGRAPHY (ERCP) WITH PROPOFOL;  Surgeon: Gatha Mayer, MD;  Location: [facility] ENDOSCOPY;  Service: Endoscopy;  Laterality: N/A;  . [place] or so   with fissure repair  . REMOVAL OF STONES  02/13/2019   Procedure: REMOVAL OF STONES;  Surgeon: Gatha Mayer, MD;  Location: [facility] ENDOSCOPY;  Service: Endoscopy;;  . [name]  02/13/2019   Procedure: [name];  Surgeon: Gatha Mayer, MD;  Location: [facility] ENDOSCOPY;  Service: Endoscopy;;  . TUBAL LIGATION     Social History:   reports that she has never smoked. She has never used smokeless tobacco. She reports current alcohol use. She reports that she does not use drugs.  Family History  Problem Relation Age of Onset  . COPD Mother   . Cancer Mother        unclear diagnosis  . Transient ischemic attack Mother   . Diabetes Sister   . Hypertension Sister   . Stroke Other   . Cancer Paternal Grandmother        ?  breast  . Heart disease Neg Hx     Medications: Patient's Medications  New Prescriptions   No medications on file  Previous Medications   ALUM & MAG HYDROXIDE-SIMETH (MYLANTA MAXIMUM STRENGTH) 400-400-40 MG/5ML SUSPENSION    Take 10 mLs by mouth as needed for indigestion.    CARBOXYMETHYLCELLULOSE (REFRESH PLUS) 0.5 % SOLN    Place 2 drops into both eyes daily as needed (dry eyes).    EPINEPHRINE 0.3 MG/0.3 ML IJ SOAJ INJECTION    Inject 0.3 mg into the muscle as needed for anaphylaxis. Use prn, takes allergy shots and has this on hand for adverse reactions    IBUPROFEN (ADVIL) 200 MG TABLET    Take 200 mg by mouth daily as needed for headache  or moderate pain.   LEVOTHYROXINE (SYNTHROID) 75 MCG TABLET    TAKE 1 TABLET BY MOUTH DAILY BEFORE BREAKFAST   LORATADINE (CLARITIN) 10 MG TABLET    Take 10 mg by mouth at bedtime.   Modified Medications   No medications on file  Discontinued Medications   No medications on file    Physical Exam:  Vitals:   03/07/19 1415  Weight: 141 lb 1.4 oz (64 kg)  Height: 5\' 3"  (1.6 m)   Body mass index is 24.99 kg/m. Wt Readings from Last 3 Encounters:  03/07/19 141 lb 1.4 oz (64 kg)  02/13/19 141 lb 1.5 oz (64 kg)  02/05/19 141 lb (64 kg)    Physical Exam Constitutional:      Appearance: Normal appearance.  HENT:     Head: Normocephalic and atraumatic.      Comments: Small waxy raised brown lesion consistent with seborrheic keratosis  Dilated pore with black head noted laterally  Neurological:     Mental Status: She is alert.     Labs reviewed: Basic Metabolic Panel: Recent Labs    11/29/18 1122 01/14/19 1517 02/04/19 1514  NA 142  --   --   K 4.2  --   --   CL 108  --   --   CO2 27  --   --   GLUCOSE 100*  --   --   BUN 19  --  21  CREATININE 0.63  --  0.57  CALCIUM 9.3  --   --   TSH  --  4.54*  --    Liver Function Tests: Recent Labs    01/17/19 1209  AST 19  ALT 24  ALKPHOS 102  BILITOT 0.6  PROT 7.3  ALBUMIN 4.2   Recent Labs    01/17/19 1209  LIPASE 14.0   No results for input(s): AMMONIA in the last 8760 hours. CBC: Recent Labs    11/29/18 1122  WBC 6.3  HGB 13.9  HCT 42.3  MCV 94.8  PLT 179   Lipid Panel: No results for input(s): CHOL, HDL, LDLCALC, TRIG, CHOLHDL, LDLDIRECT in the last 8760 hours. TSH: Recent Labs    01/14/19 1517  TSH 4.54*   A1C: No results found for: HGBA1C   Assessment/Plan .1. Seborrheic keratosis Reassurance given, plans to call dermatologist to see if they can remove because she feels like it is in an area that she constantly is touching and that bothers her.   2. Acne vulgaris Noted on face.  Education provided   [employer]. [place], [facility] [phone]

## 2019-03-20 ENCOUNTER — Telehealth: Payer: Self-pay

## 2019-04-05 ENCOUNTER — Other Ambulatory Visit: Payer: Self-pay | Admitting: Internal Medicine

## 2019-04-28 DIAGNOSIS — R030 Elevated blood-pressure reading, without diagnosis of hypertension: Secondary | ICD-10-CM | POA: Diagnosis not present

## 2019-04-28 DIAGNOSIS — R1032 Left lower quadrant pain: Secondary | ICD-10-CM | POA: Diagnosis not present

## 2019-04-28 DIAGNOSIS — L03019 Cellulitis of unspecified finger: Secondary | ICD-10-CM | POA: Diagnosis not present

## 2019-05-16 DIAGNOSIS — Z23 Encounter for immunization: Secondary | ICD-10-CM | POA: Diagnosis not present

## 2019-05-20 MED ORDER — DICLOFENAC SODIUM 1 % EX GEL: 2.0000 g | Freq: Four times a day (QID) | CUTANEOUS | 5 refills | Status: DC | PRN

## 2019-05-20 NOTE — Telephone Encounter (Signed)
Vaccine has been updated in the chart.

## 2019-05-22 ENCOUNTER — Emergency Department: Payer: Medicare Other

## 2019-05-22 ENCOUNTER — Emergency Department
Admission: EM | Admit: 2019-05-22 | Discharge: 2019-05-22 | Disposition: A | Discharge status: Home / Self Care | Payer: Medicare Other | Attending: Student | Admitting: Student

## 2019-05-22 ENCOUNTER — Other Ambulatory Visit: Payer: Self-pay

## 2019-05-22 DIAGNOSIS — I213 ST elevation (STEMI) myocardial infarction of unspecified site: Secondary | ICD-10-CM | POA: Diagnosis not present

## 2019-05-22 DIAGNOSIS — J45998 Other asthma: Secondary | ICD-10-CM | POA: Insufficient documentation

## 2019-05-22 DIAGNOSIS — E039 Hypothyroidism, unspecified: Secondary | ICD-10-CM | POA: Diagnosis not present

## 2019-05-22 DIAGNOSIS — R0602 Shortness of breath: Secondary | ICD-10-CM | POA: Diagnosis not present

## 2019-05-22 DIAGNOSIS — Z79899 Other long term (current) drug therapy: Secondary | ICD-10-CM | POA: Insufficient documentation

## 2019-05-22 DIAGNOSIS — R0789 Other chest pain: Secondary | ICD-10-CM | POA: Diagnosis not present

## 2019-05-22 DIAGNOSIS — R079 Chest pain, unspecified: Secondary | ICD-10-CM | POA: Diagnosis not present

## 2019-05-22 DIAGNOSIS — R0902 Hypoxemia: Secondary | ICD-10-CM | POA: Diagnosis not present

## 2019-05-22 LAB — CBC
HCT: 46.1 % — ABNORMAL HIGH (ref 36.0–46.0)
Hemoglobin: 14.8 g/dL (ref 12.0–15.0)
MCH: 30.8 pg (ref 26.0–34.0)
MCHC: 32.1 g/dL (ref 30.0–36.0)
MCV: 95.8 fL (ref 80.0–100.0)
Platelets: 156 10*3/uL (ref 150–400)
RBC: 4.81 MIL/uL (ref 3.87–5.11)
RDW: 12.9 % (ref 11.5–15.5)
WBC: 5.8 10*3/uL (ref 4.0–10.5)
nRBC: 0 % (ref 0.0–0.2)

## 2019-05-22 LAB — TROPONIN I (HIGH SENSITIVITY)
Troponin I (High Sensitivity): <2 ng/L (ref <18)
Troponin I (High Sensitivity): <2 ng/L (ref <18)

## 2019-05-22 LAB — BASIC METABOLIC PANEL
Anion gap: 9 (ref 5–15)
BUN: 22 mg/dL (ref 8–23)
CO2: 25 mmol/L (ref 22–32)
Calcium: 9.2 mg/dL (ref 8.9–10.3)
Chloride: 106 mmol/L (ref 98–111)
Creatinine, Ser: 0.65 mg/dL (ref 0.44–1.00)
GFR calc Af Amer: >60 mL/min (ref >60)
GFR calc non Af Amer: >60 mL/min (ref >60)
Glucose, Bld: 104 mg/dL — ABNORMAL HIGH (ref 70–99)
Potassium: 4.8 mmol/L (ref 3.5–5.1)
Sodium: 140 mmol/L (ref 135–145)

## 2019-05-22 MED ORDER — SODIUM CHLORIDE 0.9% FLUSH: 3.0000 mL | Freq: Once | INTRAVENOUS | Status: DC

## 2019-05-22 NOTE — ED Notes (Signed)
pt brought in via ems from [facility].  Pt reports onset of chest pain that was relieved by ntg given by ems.  No pain on arrival to er.  No sob.  No n/v  nsr on monitor.  Pt alert speech clear.  Iv in place   md at bedside.

## 2019-05-22 NOTE — ED Notes (Signed)
First Nurse Note:  Pt in via [facility] from home with complaints sudden onset chest pain, denies any accompanying symptoms.    Pt given 324mg  ASA, 3 NTG via EMS.  NAD noted at this time.  18G PIV to right AC.  Per EMS, vitals WDL.

## 2019-05-22 NOTE — Discharge Instructions (Addendum)
Thank you for letting us take care of you in the emergency department today.   Please continue to take any regular, prescribed medications.   Please follow up with: - Your primary care doctor to review your ER visit and follow up on your symptoms.  - A cardiology doctor, information for two options is below  Please return to the ER for any new or worsening symptoms.

## 2019-05-22 NOTE — ED Triage Notes (Signed)
Pt comes via [facility] from home with c/o chest pain. Pt states this started today. Pt states SOB.  EMS gave nitro in route. Pt denies any current pain.  Pt states it was radiating to her shoulder, pt states it is no longer doing that.

## 2019-05-22 NOTE — ED Provider Notes (Signed)
[facility] Emergency Department Provider Note  ____________________________________________   First MD Initiated Contact with Patient 05/22/19 1721     (approximate)  I have reviewed the triage vital signs and the nursing notes.  History  Chief Complaint Chest Pain    HPI Ariel Phillips is a 86 y.o. female with history of GERD, IBS who presents to the emergency department for chest pain.  Patient states she was walking around [facility], walked up the stairs, took the elevator, was walking to her room when she developed sudden onset of central chest discomfort.  She describes it as almost a pushing or heaviness type sensation.  Some associated diaphoresis, no nausea, vomiting, or shortness of breath.  Some radiation to the bilateral shoulder areas.  Symptoms improved after given ASA and nitro with EMS.  On arrival to the emergency department she is pain-free, 0/10.  She denies any history of similar symptoms.  No history of CAD, VTE, HTN, HLD, DM, tobacco use, or family history of CAD.   Past Medical Hx Past Medical History:  Diagnosis Date  . Allergic asthma   . Allergic rhinitis due to pollen   . Allergy   . GERD (gastroesophageal reflux disease)   . IBS (irritable bowel syndrome)   . Osteopenia   . Unspecified hypothyroidism     Problem List Patient Active Problem List   Diagnosis Date Noted  . Choledocholithiasis   . Aortic atherosclerosis ([place]) 01/14/2019  . Chest heaviness 12/04/2018  . Stiff neck 05/11/2018  . High arches 05/11/2018  . Allergic rhinitis 08/22/2017  . Mood disorder ([place]) 12/30/2016  . Memory loss 12/02/2015  . Benign essential tremor 07/20/2015  . Advanced directives, counseling/discussion 12/19/2013  . Routine general medical examination at a health care facility 12/13/2012  . Hypothyroidism 12/12/2011  . Allergic rhinitis due to pollen   . Allergic asthma   . GERD (gastroesophageal reflux disease)   . IBS  (irritable bowel syndrome)   . Osteopenia     Past Surgical Hx Past Surgical History:  Procedure Laterality Date  . ANAL FISSURE REPAIR  1950's  . BASAL CELL CARCINOMA EXCISION  2000's   nose  . CHOLECYSTECTOMY  2008  . COLONOSCOPY  Multiple   Negative screening exams  . ENDOSCOPIC RETROGRADE CHOLANGIOPANCREATOGRAPHY (ERCP) WITH PROPOFOL N/A 02/13/2019   Procedure: ENDOSCOPIC RETROGRADE CHOLANGIOPANCREATOGRAPHY (ERCP) WITH PROPOFOL;  Surgeon: Gatha Mayer, MD;  Location: [facility];  Service: Endoscopy;  Laterality: N/A;  . [place] or so   with fissure repair  . REMOVAL OF STONES  02/13/2019   Procedure: REMOVAL OF STONES;  Surgeon: Gatha Mayer, MD;  Location: [facility];  Service: Endoscopy;;  . [name]  02/13/2019   Procedure: [name];  Surgeon: Gatha Mayer, MD;  Location: [facility];  Service: Endoscopy;;  . TUBAL LIGATION      Medications Prior to Admission medications   Medication Sig Start Date End Date Taking? Authorizing Provider  alum & mag hydroxide-simeth (MYLANTA MAXIMUM STRENGTH) 400-400-40 MG/5ML suspension Take 10 mLs by mouth as needed for indigestion.     [provider]  carboxymethylcellulose (REFRESH PLUS) 0.5 % SOLN Place 2 drops into both eyes daily as needed (dry eyes).     [provider]  diclofenac Sodium (VOLTAREN) 1 % GEL Apply 2 g topically 4 (four) times daily as needed. 05/20/19   [name] I, MD  EPINEPHrine 0.3 mg/0.3 mL IJ SOAJ injection Inject 0.3 mg into the muscle  as needed for anaphylaxis. Use prn, takes allergy shots and has this on hand for adverse reactions     [provider]  ibuprofen (ADVIL) 200 MG tablet Take 200 mg by mouth daily as needed for headache or moderate pain.    [provider]  levothyroxine (SYNTHROID) 75 MCG tablet TAKE 1 TABLET EVERY DAY ON EMPTY STOMACHWITH A GLASS OF WATER AT LEAST 30-60 MINBEFORE BREAKFAST 04/05/19   [name]  I, MD  loratadine (CLARITIN) 10 MG tablet Take 10 mg by mouth at bedtime.     [provider]    Allergies Penicillins and Latex  Family Hx Family History  Problem Relation Age of Onset  . COPD Mother   . Cancer Mother        unclear diagnosis  . Transient ischemic attack Mother   . Diabetes Sister   . Hypertension Sister   . Stroke Other   . Cancer Paternal Grandmother        ?breast  . Heart disease Neg Hx     Social Hx Social History   Tobacco Use  . Smoking status: Never Smoker  . Smokeless tobacco: Never Used  Substance Use Topics  . Alcohol use: Yes    Comment: rare wine  . Drug use: No     Review of Systems  Constitutional: Negative for fever, chills. Eyes: Negative for visual changes. ENT: Negative for sore throat. Cardiovascular: + for chest pain. Respiratory: Negative for shortness of breath. Gastrointestinal: Negative for nausea, vomiting.  Genitourinary: Negative for dysuria. Musculoskeletal: Negative for leg swelling. Skin: Negative for rash. Neurological: Negative for for headaches.   Physical Exam  Vital Signs: ED Triage Vitals  Enc Vitals Group     BP 05/22/19 1524 (!) 113/58     Pulse Rate 05/22/19 1524 92     Resp 05/22/19 1800 18     Temp 05/22/19 1524 97.9 F (36.6 C)     Temp Source 05/22/19 1524 Oral     SpO2 05/22/19 1524 94 %     Weight 05/22/19 1522 147 lb (66.7 kg)     Height 05/22/19 1522 5\' 2"  (1.575 m)     Head Circumference --      Peak Flow --      Pain Score 05/22/19 1521 0     Pain Loc --      Pain Edu? --      Excl. in [place]? --     Constitutional: Alert and oriented.  Head: Normocephalic. Atraumatic. Eyes: Conjunctivae clear. Sclera anicteric. Nose: No congestion. No rhinorrhea. Mouth/Throat: Wearing mask.  Neck: No stridor.   Cardiovascular: Normal rate, regular rhythm. Extremities well perfused. Respiratory: Normal respiratory effort.  Lungs CTAB. Gastrointestinal: Soft. Non-tender.  Non-distended.  Musculoskeletal: No lower extremity edema. No deformities. Neurologic:  Normal speech and language. No gross focal neurologic deficits are appreciated.  Skin: Skin is warm, dry and intact. No rash noted. Psychiatric: Mood and affect are appropriate for situation.  EKG  Personally reviewed.   Rate: 91 Rhythm: sinus  Axis: normal Intervals: WNL Minimal ST changes inferiorly, seen previously  No STEMI    Radiology  CXR: IMPRESSION:  No acute abnormality noted.    Procedures  Procedure(s) performed (including critical care):  Procedures   Initial Impression / Assessment and Plan / ED Course  86 y.o. female who presents to the ED for chest discomfort, as above  Ddx: ACS, exertional angina, GERD. Received ASA and nitro with EMS PTA.   Will  obtain labs, imaging, XR.  Work up unremarkable, no actionable derangements.  Troponin x 2 negative. As such, will plan for discharge with outpatient cardiology follow up. Patient agreeable.  Given return precautions.   Final Clinical Impression(s) / ED Diagnosis  Final diagnoses:  Chest discomfort       Note:  This document was prepared using Dragon voice recognition software and may include unintentional dictation errors.   Lilia Pro., MD 05/22/19 [phone]

## 2019-05-29 ENCOUNTER — Telehealth: Payer: Self-pay | Admitting: Internal Medicine

## 2019-05-29 NOTE — Telephone Encounter (Signed)
Dr. Silvio Pate, received ED notes from hospital. I reached out to patient per his request. No answer. Left vm to call back. Pt does have an office visit next week Dr. Silvio Pate.  Venia Carbon, MD  Pilar Grammes, CMA  Please check on her  Needs follow up with cardiology or with me       Previous Messages   ----- Message -----  From: Lilia Pro., MD  Sent: 05/22/2019 11:05 PM EST  To: Venia Carbon, MD

## 2019-05-30 NOTE — Telephone Encounter (Signed)
Pt called back and spoke to [place].

## 2019-06-06 ENCOUNTER — Ambulatory Visit (INDEPENDENT_AMBULATORY_CARE_PROVIDER_SITE_OTHER): Payer: Medicare Other | Admitting: Internal Medicine

## 2019-06-06 ENCOUNTER — Encounter: Payer: Self-pay | Admitting: Internal Medicine

## 2019-06-06 ENCOUNTER — Other Ambulatory Visit: Payer: Self-pay

## 2019-06-06 DIAGNOSIS — R0789 Other chest pain: Secondary | ICD-10-CM | POA: Diagnosis not present

## 2019-06-06 MED ORDER — OMEPRAZOLE 20 MG PO CPDR: 20.0000 mg | DELAYED_RELEASE_CAPSULE | Freq: Every day | ORAL | 3 refills | Status: DC

## 2019-06-06 NOTE — Assessment & Plan Note (Signed)
Seems to have more than 1 type of pain Response to nitro is certainly worrisome for coronary ischemia--but could treat esophageal spasm as well Needs cardiology evaluation Reviewed EKG/labs/note from the ER Will start omeprazole also--since seems to have component of GERD

## 2019-06-06 NOTE — Progress Notes (Signed)
Subjective:    Patient ID: Ariel Phillips, female    DOB: 1932-06-13, 86 y.o.   MRN: [id number]  HPI Here for ER follow up This visit occurred during the SARS-CoV-2 public health emergency.  Safety protocols were in place, including screening questions prior to the visit, additional usage of staff PPE, and extensive cleaning of exam room while observing appropriate contact time as indicated for disinfecting solutions.   Was seen in ER 2 weeks ago Had been seen about 6 months ago as well First episode was sharp localized pain in center of chest---felt to be GERD (better with acid meds)  This recent one was "different" Dull, "wider" pain--filled her own chest Tried mylanta--no help No SOB No nausea or diaphoresis Did call EMS---got nitro and got relief immediately  No pain since then --other than some more GERD like pain  Current Outpatient Medications on File Prior to Visit  Medication Sig Dispense Refill  . alum & mag hydroxide-simeth (MYLANTA MAXIMUM STRENGTH) 400-400-40 MG/5ML suspension Take 10 mLs by mouth as needed for indigestion.     . calcium carbonate (TUMS - DOSED IN MG ELEMENTAL CALCIUM) 500 MG chewable tablet Chew 1 tablet by mouth daily as needed for indigestion or heartburn.    . carboxymethylcellulose (REFRESH PLUS) 0.5 % SOLN Place 2 drops into both eyes daily as needed (dry eyes).     Marland Kitchen diclofenac Sodium (VOLTAREN) 1 % GEL Apply 2 g topically 4 (four) times daily as needed. 150 g 5  . ibuprofen (ADVIL) 200 MG tablet Take 200 mg by mouth daily as needed for headache or moderate pain.    Marland Kitchen levothyroxine (SYNTHROID) 75 MCG tablet TAKE 1 TABLET EVERY DAY ON EMPTY STOMACHWITH A GLASS OF WATER AT LEAST 30-60 MINBEFORE BREAKFAST 90 tablet 3  . loratadine (CLARITIN) 10 MG tablet Take 10 mg by mouth at bedtime.      No current facility-administered medications on file prior to visit.    Allergies  Allergen Reactions  . Penicillins Diarrhea    Did it involve swelling  of the face/tongue/throat, SOB, or low BP? No Did it involve sudden or severe rash/hives, skin peeling, or any reaction on the inside of your mouth or nose? No Did you need to seek medical attention at a hospital or doctor's office? Yes When did it last happen?20+ years If all above answers are "NO", may proceed with cephalosporin use.   . Latex Rash    Past Medical History:  Diagnosis Date  . Allergic asthma   . Allergic rhinitis due to pollen   . Allergy   . GERD (gastroesophageal reflux disease)   . IBS (irritable bowel syndrome)   . Osteopenia   . Unspecified hypothyroidism     Past Surgical History:  Procedure Laterality Date  . ANAL FISSURE REPAIR  1950's  . BASAL CELL CARCINOMA EXCISION  2000's   nose  . CHOLECYSTECTOMY  2008  . COLONOSCOPY  Multiple   Negative screening exams  . ENDOSCOPIC RETROGRADE CHOLANGIOPANCREATOGRAPHY (ERCP) WITH PROPOFOL N/A 02/13/2019   Procedure: ENDOSCOPIC RETROGRADE CHOLANGIOPANCREATOGRAPHY (ERCP) WITH PROPOFOL;  Surgeon: Gatha Mayer, MD;  Location: [facility];  Service: Endoscopy;  Laterality: N/A;  . [place] or so   with fissure repair  . REMOVAL OF STONES  02/13/2019   Procedure: REMOVAL OF STONES;  Surgeon: Gatha Mayer, MD;  Location: [facility];  Service: Endoscopy;;  . [name]  02/13/2019   Procedure: [name];  Surgeon: Gatha Mayer, MD;  Location: [facility];  Service: Endoscopy;;  . TUBAL LIGATION      Family History  Problem Relation Age of Onset  . COPD Mother   . Cancer Mother        unclear diagnosis  . Transient ischemic attack Mother   . Diabetes Sister   . Hypertension Sister   . Stroke Other   . Cancer Paternal Grandmother        ?breast  . Heart disease Neg Hx     Social History   Socioeconomic History  . Marital status: Divorced    Spouse name: Not on file  . Number of children: 1  . Years of education: Not on file  . Highest education level: Not on  file  Occupational History  . Occupation: Retired historian--consultant  Tobacco Use  . Smoking status: Never Smoker  . Smokeless tobacco: Never Used  Substance and Sexual Activity  . Alcohol use: Yes    Comment: rare wine  . Drug use: No  . Sexual activity: Not Currently  Other Topics Concern  . Not on file  Social History Narrative   Divorced and retired , moved from [place], [place]   1 Daughter in [place]   Has living will   Daughter has health care POA.---[name]   Requests DNR--order done 06/19/12   Would not want prolonged mechanical ventilation   May accept tube feeds temporarily but not if cognitively unaware   Social Determinants of Health   Financial Resource Strain:   . Difficulty of Paying Living Expenses: Not on file  Food Insecurity:   . Worried About Charity fundraiser in the Last Year: Not on file  . Ran Out of Food in the Last Year: Not on file  Transportation Needs:   . Lack of Transportation (Medical): Not on file  . Lack of Transportation (Non-Medical): Not on file  Physical Activity:   . Days of Exercise per Week: Not on file  . Minutes of Exercise per Session: Not on file  Stress:   . Feeling of Stress : Not on file  Social Connections:   . Frequency of Communication with Friends and Family: Not on file  . Frequency of Social Gatherings with Friends and Family: Not on file  . Attends Religious Services: Not on file  . Active Member of Clubs or Organizations: Not on file  . Attends Archivist Meetings: Not on file  . Marital Status: Not on file  Intimate Partner Violence:   . Fear of Current or Ex-Partner: Not on file  . Emotionally Abused: Not on file  . Physically Abused: Not on file  . Sexually Abused: Not on file   Review of Systems Eating okay Sleeps well---no symptoms in bed No exercise---no pain when carrying groceries, etc    Objective:   Physical Exam  Constitutional: She appears well-developed. No  distress.  Neck: No thyromegaly present.  Cardiovascular: Normal rate, regular rhythm, normal heart sounds and intact distal pulses. Exam reveals no gallop.  No murmur heard. Respiratory: Effort normal and breath sounds normal. No respiratory distress. She has no wheezes. She has no rales.  GI: Soft.  Mild epigastric tenderness  Musculoskeletal:        General: No edema.  Lymphadenopathy:    She has no cervical adenopathy.           Assessment & Plan:

## 2019-06-10 ENCOUNTER — Ambulatory Visit (INDEPENDENT_AMBULATORY_CARE_PROVIDER_SITE_OTHER): Payer: Medicare Other | Admitting: Cardiology

## 2019-06-10 ENCOUNTER — Encounter: Payer: Self-pay | Admitting: Cardiology

## 2019-06-10 ENCOUNTER — Other Ambulatory Visit: Payer: Self-pay

## 2019-06-10 VITALS — BP 136/84 | HR 81 | Ht 62.0 in | Wt 148.0 lb

## 2019-06-10 DIAGNOSIS — R072 Precordial pain: Secondary | ICD-10-CM | POA: Diagnosis not present

## 2019-06-10 DIAGNOSIS — R079 Chest pain, unspecified: Secondary | ICD-10-CM

## 2019-06-10 DIAGNOSIS — R0600 Dyspnea, unspecified: Secondary | ICD-10-CM

## 2019-06-10 MED ORDER — METOPROLOL TARTRATE 100 MG PO TABS: ORAL_TABLET | ORAL | 0 refills | Status: DC

## 2019-06-10 NOTE — Progress Notes (Signed)
Cardiology Office Note:    Date:  06/10/2019   ID:  Ariel Phillips, DOB 1932-09-04, MRN [id number]  PCP:  Venia Carbon, MD  Cardiologist:  Kate Sable, MD  Electrophysiologist:  None   Referring MD: Venia Carbon, MD   Chief Complaint  Patient presents with  . other    Chest heaviness. Meds reviewed verbally with pt.    History of Present Illness:    Ariel Phillips is a 86 y.o. female with a hx of GERD, who presents due to chest pain.  Patient states having 3 instances of chest discomfort prompting her to go to the emergency room.  First instance was about 6 months ago while she was at home and had severe epigastric chest pain.  The pain lasted for about 30 minutes, patient was very worried and called EMS.  She was taken to the ED where work-up was unrevealing.  About 3 months ago, she had severe chest tightness which she rates as an 8 out of 10.  The pain occurred while she went to pick up her mail.  This pain was different from her typical acid reflux pain.  She called EMS and was given some sublingual nitroglycerin which instantly resolve the pain.  She was chest pain-free upon arrival in the emergency room.  About 3 weeks ago she had another incident of pain which prompted her to go to the emergency room per EMS.  She was evaluated in the ED where EKG did not show any acute ST changes, high-sensitivity troponins were normal x2.  She was discharged with recommendations for cardiology follow-up.  Her chest pain symptoms are not related with exertion.  She denies edema, palpitations.  She states having some shortness of breath with exertion of late which she attributes to deconditioning.  Past Medical History:  Diagnosis Date  . Allergic asthma   . Allergic rhinitis due to pollen   . Allergy   . GERD (gastroesophageal reflux disease)   . IBS (irritable bowel syndrome)   . Osteopenia   . Unspecified hypothyroidism     Past Surgical History:  Procedure Laterality  Date  . ANAL FISSURE REPAIR  1950's  . BASAL CELL CARCINOMA EXCISION  2000's   nose  . CHOLECYSTECTOMY  2008  . COLONOSCOPY  Multiple   Negative screening exams  . ENDOSCOPIC RETROGRADE CHOLANGIOPANCREATOGRAPHY (ERCP) WITH PROPOFOL N/A 02/13/2019   Procedure: ENDOSCOPIC RETROGRADE CHOLANGIOPANCREATOGRAPHY (ERCP) WITH PROPOFOL;  Surgeon: Gatha Mayer, MD;  Location: [facility];  Service: Endoscopy;  Laterality: N/A;  . [place] or so   with fissure repair  . REMOVAL OF STONES  02/13/2019   Procedure: REMOVAL OF STONES;  Surgeon: Gatha Mayer, MD;  Location: [facility];  Service: Endoscopy;;  . [name]  02/13/2019   Procedure: [name];  Surgeon: Gatha Mayer, MD;  Location: [facility];  Service: Endoscopy;;  . TUBAL LIGATION      Current Medications: Current Meds  Medication Sig  . alum & mag hydroxide-simeth (MYLANTA MAXIMUM STRENGTH) 400-400-40 MG/5ML suspension Take 10 mLs by mouth as needed for indigestion.   . calcium carbonate (TUMS - DOSED IN MG ELEMENTAL CALCIUM) 500 MG chewable tablet Chew 1 tablet by mouth daily as needed for indigestion or heartburn.  . carboxymethylcellulose (REFRESH PLUS) 0.5 % SOLN Place 2 drops into both eyes daily as needed (dry eyes).   Marland Kitchen diclofenac Sodium (VOLTAREN) 1 % GEL Apply 2 g topically 4 (four) times daily as needed.  Marland Kitchen  ibuprofen (ADVIL) 200 MG tablet Take 200 mg by mouth daily as needed for headache or moderate pain.  Marland Kitchen levothyroxine (SYNTHROID) 75 MCG tablet TAKE 1 TABLET EVERY DAY ON EMPTY STOMACHWITH A GLASS OF WATER AT LEAST 30-60 MINBEFORE BREAKFAST  . loratadine (CLARITIN) 10 MG tablet Take 10 mg by mouth at bedtime.      Allergies:   Penicillins and Latex   Social History   Socioeconomic History  . Marital status: Divorced    Spouse name: Not on file  . Number of children: 1  . Years of education: Not on file  . Highest education level: Not on file  Occupational History  .  Occupation: Retired historian--consultant  Tobacco Use  . Smoking status: Never Smoker  . Smokeless tobacco: Never Used  Substance and Sexual Activity  . Alcohol use: Yes    Comment: rare wine  . Drug use: No  . Sexual activity: Not Currently  Other Topics Concern  . Not on file  Social History Narrative   Divorced and retired , moved from [place], [place]   1 Daughter in [place]   Has living will   Daughter has health care POA.---[name]   Requests DNR--order done 06/19/12   Would not want prolonged mechanical ventilation   May accept tube feeds temporarily but not if cognitively unaware   Social Determinants of Health   Financial Resource Strain:   . Difficulty of Paying Living Expenses: Not on file  Food Insecurity:   . Worried About Charity fundraiser in the Last Year: Not on file  . Ran Out of Food in the Last Year: Not on file  Transportation Needs:   . Lack of Transportation (Medical): Not on file  . Lack of Transportation (Non-Medical): Not on file  Physical Activity:   . Days of Exercise per Week: Not on file  . Minutes of Exercise per Session: Not on file  Stress:   . Feeling of Stress : Not on file  Social Connections:   . Frequency of Communication with Friends and Family: Not on file  . Frequency of Social Gatherings with Friends and Family: Not on file  . Attends Religious Services: Not on file  . Active Member of Clubs or Organizations: Not on file  . Attends Archivist Meetings: Not on file  . Marital Status: Not on file     Family History: The patient's family history includes COPD in her mother; Cancer in her mother and paternal grandmother; Diabetes in her sister; Hypertension in her sister; Stroke in an other family member; Transient ischemic attack in her mother. There is no history of Heart disease.  ROS:   Please see the history of present illness.     All other systems reviewed and are negative.  EKGs/Labs/Other  Studies Reviewed:    The following studies were reviewed today:   EKG:  EKG is  ordered today.  The ekg ordered today demonstrates normal sinus rhythm, normal ECG.  Recent Labs: 01/14/2019: TSH 4.54 01/17/2019: ALT 24 05/22/2019: BUN 22; Creatinine, Ser 0.65; Hemoglobin 14.8; Platelets 156; Potassium 4.8; Sodium 140  Recent Lipid Panel    Component Value Date/Time   CHOL 184 12/12/2011 1517   TRIG 56.0 12/12/2011 1517   HDL 74.70 12/12/2011 1517   CHOLHDL 2 12/12/2011 1517   VLDL 11.2 12/12/2011 1517   LDLCALC 98 12/12/2011 1517    Physical Exam:    VS:  BP 136/84 (BP Location: Right Arm, Patient Position: Sitting,  Cuff Size: Normal)   Pulse 81   Ht 5\' 2"  (1.575 m)   Wt 148 lb (67.1 kg)   SpO2 99%   BMI 27.07 kg/m     Wt Readings from Last 3 Encounters:  06/10/19 148 lb (67.1 kg)  06/06/19 147 lb (66.7 kg)  05/22/19 147 lb (66.7 kg)     GEN:  Well nourished, well developed in no acute distress HEENT: Normal NECK: No JVD; No carotid bruits LYMPHATICS: No lymphadenopathy CARDIAC: RRR, no murmurs, rubs, gallops RESPIRATORY:  Clear to auscultation without rales, wheezing or rhonchi  ABDOMEN: Soft, non-tender, non-distended MUSCULOSKELETAL:  No edema; No deformity  SKIN: Warm and dry NEUROLOGIC:  Alert and oriented x 3 PSYCHIATRIC:  Normal affect   ASSESSMENT:    1. Chest pain of uncertain etiology   2. Dyspnea, unspecified type   3. Precordial pain    PLAN:    In order of problems listed above:  1. Patient with over 6 months of precordial pain prompting multiple ER visits.  Her symptoms sound atypical but concerning in sense of worsening symptoms and symptoms instantly improved with nitroglycerin.  Will evaluate patient with a coronary CT angiogram for presence of CAD. 2. Patient with more dyspnea with ordinary exertion.  This could be related to deconditioning versus cardiomyopathy.  Will evaluate with echocardiogram.  This note was generated in part or whole  with voice recognition software. Voice recognition is usually quite accurate but there are transcription errors that can and very often do occur. I apologize for any typographical errors that were not detected and corrected.  Medication Adjustments/Labs and Tests Ordered: Current medicines are reviewed at length with the patient today.  Concerns regarding medicines are outlined above.  Orders Placed This Encounter  Procedures  . CT CORONARY MORPH W/CTA COR W/SCORE W/CA W/CM &/OR WO/CM  . CT CORONARY FRACTIONAL FLOW RESERVE DATA PREP  . CT CORONARY FRACTIONAL FLOW RESERVE FLUID ANALYSIS  . Basic metabolic panel  . EKG 12-Lead   Meds ordered this encounter  Medications  . metoprolol tartrate (LOPRESSOR) 100 MG tablet    Sig: Take 1 tablet (100 mg) by mouth 2 hours prior to your Cardiac CTA    Dispense:  1 tablet    Refill:  0    Patient Instructions  Medication Instructions:  Your physician recommends that you continue on your current medications as directed. Please refer to the Current Medication list given to you today.  A one time dose of Metoprolol 100mg  tablet has been sent to your pharmacy. Please take 2 hours prior to your Cardiac CTA.  *If you need a refill on your cardiac medications before your next appointment, please call your pharmacy*  Lab Work: Your will need lab work prior to your Cardiac CTA. Please have your lab drawn at the [facility] 2-3 days prior to the CT.  Their hour are Mon-Fri 7am-6pm.  If you have labs (blood work) drawn today and your tests are completely normal, you will receive your results only by: Marland Kitchen MyChart Message (if you have MyChart) OR . A paper copy in the mail If you have any lab test that is abnormal or we need to change your treatment, we will call you to review the results.  Testing/Procedures: Your physician has requested that you have cardiac CT. Cardiac computed tomography (CT) is a painless test that uses an x-ray machine to  take clear, detailed pictures of your heart. For further information please visit [facility]ugeFiesta[facility]tn. Please follow  instruction sheet as given.     Follow-Up: At [facility], you and your health needs are our priority.  As part of our continuing mission to provide you with exceptional heart care, we have created designated Provider Care Teams.  These Care Teams include your primary Cardiologist (physician) and Advanced Practice Providers (APPs -  Physician Assistants and Nurse Practitioners) who all work together to provide you with the care you need, when you need it.  Your next appointment:   4 week(s)  The format for your next appointment:   In Person  Provider:    You may see Kate Sable, MD or one of the following Advanced Practice Providers on your designated Care Team:    Murray Hodgkins, NP  Christell Faith, PA-C  Marrianne Mood, PA-C   Other Instructions Your cardiac CT will be scheduled at one of the below locations:   [facility] [address] [place], [place] [address] [phone]  [place] [address] [place], [place] [address] [phone]  If scheduled at [facility], please arrive at the [facility] main entrance of [facility] 30 minutes prior to test start time. Proceed to the [facility] Radiology Department (first floor) to check-in and test prep.  If scheduled at [facility], please arrive 15 mins early for check-in and test prep.  Please follow these instructions carefully (unless otherwise directed):   On the Night Before the Test: . Be sure to Drink plenty of water. . Do not consume any caffeinated/decaffeinated beverages or chocolate 12 hours prior to your test. . Do not take any antihistamines 12 hours prior to your test.   On the Day of the Test: . Drink plenty of water. Do not drink any water within one hour of the  test. . Do not eat any food 4 hours prior to the test. . You may take your regular medications prior to the test.  . Take metoprolol (Lopressor) two hours prior to test. . FEMALES- please wear underwire-free bra if available       After the Test: . Drink plenty of water. . After receiving IV contrast, you may experience a mild flushed feeling. This is normal. . On occasion, you may experience a mild rash up to 24 hours after the test. This is not dangerous. If this occurs, you can take Benadryl 25 mg and increase your fluid intake. . If you experience trouble breathing, this can be serious. If it is severe call 911 IMMEDIATELY. If it is mild, please call our office. . If you take any of these medications: Glipizide/Metformin, Avandament, Glucavance, please do not take 48 hours after completing test unless otherwise instructed.   Once we have confirmed authorization from your insurance company, we will call you to set up a date and time for your test.   For non-scheduling related questions, please contact the cardiac imaging nurse navigator should you have any questions/concerns: Marchia Bond, RN Navigator Cardiac Imaging [facility] [phone] mobile       Signed, Kate Sable, MD  06/10/2019 12:21 PM    [place]

## 2019-06-10 NOTE — Addendum Note (Signed)
Addended by: Lamar Laundry on: 06/10/2019 01:09 PM   Modules accepted: Orders

## 2019-06-10 NOTE — Patient Instructions (Addendum)
Medication Instructions:  Your physician recommends that you continue on your current medications as directed. Please refer to the Current Medication list given to you today.  A one time dose of Metoprolol 100mg  tablet has been sent to your pharmacy. Please take 2 hours prior to your Cardiac CTA.  *If you need a refill on your cardiac medications before your next appointment, please call your pharmacy*  Lab Work: Your will need lab work prior to your Cardiac CTA. Please have your lab drawn at the [facility] 2-3 days prior to the CT.  Their hour are Mon-Fri 7am-6pm.  If you have labs (blood work) drawn today and your tests are completely normal, you will receive your results only by: Marland Kitchen MyChart Message (if you have MyChart) OR . A paper copy in the mail If you have any lab test that is abnormal or we need to change your treatment, we will call you to review the results.  Testing/Procedures: Your physician has requested that you have an echocardiogram. Echocardiography is a painless test that uses sound waves to create images of your heart. It provides your doctor with information about the size and shape of your heart and how well your heart's chambers and valves are working. This procedure takes approximately one hour. There are no restrictions for this procedure.    Your physician has requested that you have cardiac CT. Cardiac computed tomography (CT) is a painless test that uses an x-ray machine to take clear, detailed pictures of your heart. For further information please visit HugeFiesta[facility]tn. Please follow instruction sheet as given.     Follow-Up: At [facility], you and your health needs are our priority.  As part of our continuing mission to provide you with exceptional heart care, we have created designated Provider Care Teams.  These Care Teams include your primary Cardiologist (physician) and Advanced Practice Providers (APPs -  Physician Assistants and Nurse  Practitioners) who all work together to provide you with the care you need, when you need it.  Your next appointment:   4 week(s)  The format for your next appointment:   In Person  Provider:    You may see Kate Sable, MD or one of the following Advanced Practice Providers on your designated Care Team:    Murray Hodgkins, NP  Christell Faith, PA-C  Marrianne Mood, PA-C   Other Instructions Your cardiac CT will be scheduled at one of the below locations:   [facility] [address] [place], [place] [address] [phone]  [place] [address] [place],  [address] [phone]  If scheduled at [facility], please arrive at the [facility] main entrance of [facility] 30 minutes prior to test start time. Proceed to the [facility] Radiology Department (first floor) to check-in and test prep.  If scheduled at [facility], please arrive 15 mins early for check-in and test prep.  Please follow these instructions carefully (unless otherwise directed):   On the Night Before the Test: . Be sure to Drink plenty of water. . Do not consume any caffeinated/decaffeinated beverages or chocolate 12 hours prior to your test. . Do not take any antihistamines 12 hours prior to your test.   On the Day of the Test: . Drink plenty of water. Do not drink any water within one hour of the test. . Do not eat any food 4 hours prior to the test. . You  may take your regular medications prior to the test.  . Take metoprolol (Lopressor) two hours prior to test. . FEMALES- please wear underwire-free bra if available       After the Test: . Drink plenty of water. . After receiving IV contrast, you may experience a mild flushed feeling. This is normal. . On occasion, you may experience a mild rash up to 24 hours after the test. This is not dangerous. If this occurs, you  can take Benadryl 25 mg and increase your fluid intake. . If you experience trouble breathing, this can be serious. If it is severe call 911 IMMEDIATELY. If it is mild, please call our office. . If you take any of these medications: Glipizide/Metformin, Avandament, Glucavance, please do not take 48 hours after completing test unless otherwise instructed.   Once we have confirmed authorization from your insurance company, we will call you to set up a date and time for your test.   For non-scheduling related questions, please contact the cardiac imaging nurse navigator should you have any questions/concerns: Marchia Bond, RN Navigator Cardiac Imaging [facility] [phone] mobile

## 2019-06-12 DIAGNOSIS — Q130 Coloboma of iris: Secondary | ICD-10-CM | POA: Diagnosis not present

## 2019-06-13 DIAGNOSIS — Z23 Encounter for immunization: Secondary | ICD-10-CM | POA: Diagnosis not present

## 2019-07-09 ENCOUNTER — Encounter (HOSPITAL_COMMUNITY): Payer: Self-pay

## 2019-07-09 ENCOUNTER — Telehealth (HOSPITAL_COMMUNITY): Payer: Self-pay | Admitting: Emergency Medicine

## 2019-07-09 ENCOUNTER — Other Ambulatory Visit
Admission: RE | Admit: 2019-07-09 | Discharge: 2019-07-09 | Disposition: A | Discharge status: Home / Self Care | Payer: Medicare Other | Source: Ambulatory Visit | Attending: Cardiology | Admitting: Cardiology

## 2019-07-09 DIAGNOSIS — R072 Precordial pain: Secondary | ICD-10-CM | POA: Diagnosis not present

## 2019-07-09 LAB — BASIC METABOLIC PANEL
Anion gap: 10 (ref 5–15)
BUN: 21 mg/dL (ref 8–23)
CO2: 21 mmol/L — ABNORMAL LOW (ref 22–32)
Calcium: 9.3 mg/dL (ref 8.9–10.3)
Chloride: 107 mmol/L (ref 98–111)
Creatinine, Ser: 0.61 mg/dL (ref 0.44–1.00)
GFR calc Af Amer: >60 mL/min (ref >60)
GFR calc non Af Amer: >60 mL/min (ref >60)
Glucose, Bld: 102 mg/dL — ABNORMAL HIGH (ref 70–99)
Potassium: 5.3 mmol/L — ABNORMAL HIGH (ref 3.5–5.1)
Sodium: 138 mmol/L (ref 135–145)

## 2019-07-09 NOTE — Telephone Encounter (Signed)
Left message on voicemail with name and callback number Char Feltman RN Navigator Cardiac Imaging [facility] [phone] Office [phone] Cell  

## 2019-07-10 ENCOUNTER — Telehealth (HOSPITAL_COMMUNITY): Payer: Self-pay | Admitting: Emergency Medicine

## 2019-07-10 NOTE — Telephone Encounter (Signed)
Pt calling to review home medications and what she can/cannot take before CCTA appt tomorrow. Pt verbalized understanding. Appreciated the explainations.   Marchia Bond RN Navigator Cardiac Imaging [facility] [phone] Office  [phone] Cell

## 2019-07-11 ENCOUNTER — Ambulatory Visit
Admission: RE | Admit: 2019-07-11 | Discharge: 2019-07-11 | Disposition: A | Discharge status: Home / Self Care | Payer: Medicare Other | Source: Ambulatory Visit | Attending: Cardiology | Admitting: Cardiology

## 2019-07-11 ENCOUNTER — Other Ambulatory Visit: Payer: Self-pay | Admitting: Cardiology

## 2019-07-11 ENCOUNTER — Other Ambulatory Visit: Payer: Self-pay

## 2019-07-11 ENCOUNTER — Ambulatory Visit (INDEPENDENT_AMBULATORY_CARE_PROVIDER_SITE_OTHER): Payer: Medicare Other

## 2019-07-11 DIAGNOSIS — R931 Abnormal findings on diagnostic imaging of heart and coronary circulation: Secondary | ICD-10-CM

## 2019-07-11 DIAGNOSIS — R072 Precordial pain: Secondary | ICD-10-CM

## 2019-07-11 DIAGNOSIS — R0600 Dyspnea, unspecified: Secondary | ICD-10-CM | POA: Diagnosis not present

## 2019-07-11 LAB — ECHOCARDIOGRAM COMPLETE

## 2019-07-11 MED ORDER — METOPROLOL TARTRATE 5 MG/5ML IV SOLN
5.0000 mg | INTRAVENOUS | Status: DC | PRN
  Administered 2019-07-11: 10 mg via INTRAVENOUS
  Administered 2019-07-11: 5 mg via INTRAVENOUS

## 2019-07-11 MED ORDER — DILTIAZEM HCL 25 MG/5ML IV SOLN
5.0000 mg | Freq: Once | INTRAVENOUS | Status: AC
  Administered 2019-07-11: 5 mg via INTRAVENOUS

## 2019-07-11 MED ORDER — NITROGLYCERIN 0.4 MG SL SUBL
0.8000 mg | SUBLINGUAL_TABLET | Freq: Once | SUBLINGUAL | Status: AC
  Administered 2019-07-11: 0.8 mg via SUBLINGUAL

## 2019-07-11 MED ORDER — IOHEXOL 350 MG/ML SOLN
75.0000 mL | Freq: Once | INTRAVENOUS | Status: AC | PRN
  Administered 2019-07-11: 75 mL via INTRAVENOUS

## 2019-07-11 NOTE — Progress Notes (Signed)
   07/11/19 0905  Vital Signs  ECG Heart Rate 75  BP 120/60   Dr. Garen Lah informed of patient heart rate 74 and irregularity. Patient heart rate ranging from upper 50s to 80s. Order given for 10mg  of IV metoprolol.   Patient heart rate still elevated after IV Lopressor, 5mg  of IV Cardizem given @. Patient heart rate in low 60s after administration.

## 2019-07-12 DIAGNOSIS — R072 Precordial pain: Secondary | ICD-10-CM | POA: Diagnosis not present

## 2019-07-19 ENCOUNTER — Ambulatory Visit (INDEPENDENT_AMBULATORY_CARE_PROVIDER_SITE_OTHER): Payer: Medicare Other | Admitting: Cardiology

## 2019-07-19 ENCOUNTER — Other Ambulatory Visit: Payer: Self-pay

## 2019-07-19 ENCOUNTER — Encounter: Payer: Self-pay | Admitting: Cardiology

## 2019-07-19 VITALS — BP 140/84 | HR 85 | Ht 62.0 in | Wt 150.1 lb

## 2019-07-19 DIAGNOSIS — I2510 Atherosclerotic heart disease of native coronary artery without angina pectoris: Secondary | ICD-10-CM

## 2019-07-19 DIAGNOSIS — R0600 Dyspnea, unspecified: Secondary | ICD-10-CM

## 2019-07-19 MED ORDER — ASPIRIN EC 81 MG PO TBEC: 81.0000 mg | DELAYED_RELEASE_TABLET | Freq: Every day | ORAL | Status: DC

## 2019-07-19 NOTE — Progress Notes (Signed)
Cardiology Office Note:    Date:  07/19/2019   ID:  [name], DOB 04/09/1933, MRN [id number]  PCP:  Venia Carbon, MD  Cardiologist:  Kate Sable, MD  Electrophysiologist:  None   Referring MD: Venia Carbon, MD   Chief Complaint  Patient presents with  . office visit    Pt has no complaints. Meds verbally reviewed w/ pt.    History of Present Illness:    Ariel Phillips is a 86 y.o. female with a hx of GERD, who presents for follow-up.  She was last seen due to chest pain.  Patient had 3 instances of chest discomfort prompting her to go to the emergency room.    First instance occured while she was at home and had severe epigastric chest pain.  The pain lasted for about 30 minutes, patient was very worried and called EMS.  She was taken to the ED where work-up was unrevealing. Second episode 3 months after, she had severe chest tightness which she rates as an 8 out of 10.  The pain occurred while she went to pick up her mail.  This pain was different from her typical acid reflux pain.  She called EMS and was given some sublingual nitroglycerin which instantly resolve the pain.  She was chest pain-free upon arrival in the emergency room.  About 3 weeks prior to clinic visit, she had another incident of pain which prompted her to go to the emergency room per EMS.  She was evaluated in the ED where EKG did not show any acute ST changes, high-sensitivity troponins were normal x2.Marland Kitchen  Her chest pain symptoms are not related with exertion.  She denies edema, palpitations.  She states having some shortness of breath with exertion of late which she attributes to deconditioning.  Echocardiogram and coronary CTA was ordered.  Patient now presents for results.  Past Medical History:  Diagnosis Date  . Allergic asthma   . Allergic rhinitis due to pollen   . Allergy   . GERD (gastroesophageal reflux disease)   . IBS (irritable bowel syndrome)   . Osteopenia   . Unspecified  hypothyroidism     Past Surgical History:  Procedure Laterality Date  . ANAL FISSURE REPAIR  1950's  . BASAL CELL CARCINOMA EXCISION  2000's   nose  . CHOLECYSTECTOMY  2008  . COLONOSCOPY  Multiple   Negative screening exams  . ENDOSCOPIC RETROGRADE CHOLANGIOPANCREATOGRAPHY (ERCP) WITH PROPOFOL N/A 02/13/2019   Procedure: ENDOSCOPIC RETROGRADE CHOLANGIOPANCREATOGRAPHY (ERCP) WITH PROPOFOL;  Surgeon: Gatha Mayer, MD;  Location: [facility];  Service: Endoscopy;  Laterality: N/A;  . [place] or so   with fissure repair  . REMOVAL OF STONES  02/13/2019   Procedure: REMOVAL OF STONES;  Surgeon: Gatha Mayer, MD;  Location: [facility];  Service: Endoscopy;;  . [name]  02/13/2019   Procedure: [name];  Surgeon: Gatha Mayer, MD;  Location: [facility];  Service: Endoscopy;;  . TUBAL LIGATION      Current Medications: Current Meds  Medication Sig  . albuterol (VENTOLIN HFA) 108 (90 Base) MCG/ACT inhaler Inhale into the lungs every 6 (six) hours as needed for wheezing or shortness of breath. Two puffs three times a day as needed  . alum & mag hydroxide-simeth (MYLANTA MAXIMUM STRENGTH) 400-400-40 MG/5ML suspension Take 10 mLs by mouth as needed for indigestion.   . carboxymethylcellulose (REFRESH PLUS) 0.5 % SOLN Place 2 drops into both eyes daily as needed (dry  eyes).   . diclofenac Sodium (VOLTAREN) 1 % GEL Apply 2 g topically 4 (four) times daily as needed.  Marland Kitchen EPINEPHrine 0.3 mg/0.3 mL IJ SOAJ injection Inject 0.3 mg into the muscle as needed for anaphylaxis.  Marland Kitchen ibuprofen (ADVIL) 200 MG tablet Take 200 mg by mouth daily as needed for headache or moderate pain.  Marland Kitchen levothyroxine (SYNTHROID) 75 MCG tablet TAKE 1 TABLET EVERY DAY ON EMPTY STOMACHWITH A GLASS OF WATER AT LEAST 30-60 MINBEFORE BREAKFAST  . loratadine (CLARITIN) 10 MG tablet Take 10 mg by mouth at bedtime.   Marland Kitchen omeprazole (PRILOSEC) 20 MG capsule Take 1 capsule (20 mg total) by mouth  at bedtime.     Allergies:   Penicillins and Latex   Social History   Socioeconomic History  . Marital status: Divorced    Spouse name: Not on file  . Number of children: 1  . Years of education: Not on file  . Highest education level: Not on file  Occupational History  . Occupation: Retired historian--consultant  Tobacco Use  . Smoking status: Never Smoker  . Smokeless tobacco: Never Used  Substance and Sexual Activity  . Alcohol use: Yes    Comment: rare wine  . Drug use: No  . Sexual activity: Not Currently  Other Topics Concern  . Not on file  Social History Narrative   Divorced and retired , moved from [place], [place]   1 Daughter in [place]   Has living will   Daughter has health care POA.---[name]   Requests DNR--order done 06/19/12   Would not want prolonged mechanical ventilation   May accept tube feeds temporarily but not if cognitively unaware   Social Determinants of Health   Financial Resource Strain:   . Difficulty of Paying Living Expenses:   Food Insecurity:   . Worried About Charity fundraiser in the Last Year:   . Arboriculturist in the Last Year:   Transportation Needs:   . Film/video editor (Medical):   Marland Kitchen Lack of Transportation (Non-Medical):   Physical Activity:   . Days of Exercise per Week:   . Minutes of Exercise per Session:   Stress:   . Feeling of Stress :   Social Connections:   . Frequency of Communication with Friends and Family:   . Frequency of Social Gatherings with Friends and Family:   . Attends Religious Services:   . Active Member of Clubs or Organizations:   . Attends Archivist Meetings:   Marland Kitchen Marital Status:      Family History: The patient's family history includes COPD in her mother; Cancer in her mother and paternal grandmother; Diabetes in her sister; Hypertension in her sister; Stroke in an other family member; Transient ischemic attack in her mother. There is no history of Heart  disease.  ROS:   Please see the history of present illness.     All other systems reviewed and are negative.  EKGs/Labs/Other Studies Reviewed:    The following studies were reviewed today:   EKG:  EKG is  ordered today.  The ekg ordered today demonstrates normal sinus rhythm, normal ECG.  Recent Labs: 01/14/2019: TSH 4.54 01/17/2019: ALT 24 05/22/2019: Hemoglobin 14.8; Platelets 156 07/09/2019: BUN 21; Creatinine, Ser 0.61; Potassium 5.3; Sodium 138  Recent Lipid Panel    Component Value Date/Time   CHOL 184 12/12/2011 1517   TRIG 56.0 12/12/2011 1517   HDL 74.70 12/12/2011 1517   CHOLHDL 2 12/12/2011 1517  VLDL 11.2 12/12/2011 1517   LDLCALC 98 12/12/2011 1517    Physical Exam:    VS:  BP 140/84 (BP Location: Left Arm, Patient Position: Sitting, Cuff Size: Normal)   Pulse 85   Ht 5\' 2"  (1.575 m)   Wt 150 lb 2 oz (68.1 kg)   SpO2 98%   BMI 27.46 kg/m     Wt Readings from Last 3 Encounters:  07/19/19 150 lb 2 oz (68.1 kg)  06/10/19 148 lb (67.1 kg)  06/06/19 147 lb (66.7 kg)     GEN:  Well nourished, well developed in no acute distress HEENT: Normal NECK: No JVD; No carotid bruits LYMPHATICS: No lymphadenopathy CARDIAC: RRR, no murmurs, rubs, gallops RESPIRATORY:  Clear to auscultation without rales, wheezing or rhonchi  ABDOMEN: Soft, non-tender, non-distended MUSCULOSKELETAL:  No edema; No deformity  SKIN: Warm and dry NEUROLOGIC:  Alert and oriented x 3 PSYCHIATRIC:  Normal affect   ASSESSMENT:    1. Dyspnea, unspecified type   2. Coronary artery disease involving native coronary artery of native heart without angina pectoris    PLAN:    In order of problems listed above:  1. Patient with history of atypical chest pain and dyspnea. Currently denies symptoms. mylanta helps with symptoms of reflux.  Echocardiogram showed normal systolic function with EF 60 to 65%, impaired relaxation.  Coronary CT angiogram showed a coronary calcium score of 200.   Moderate stenosis noted in the proximal to mid LAD.  No significant stenosis in the LAD, the second diagonal had a significant stenosis with FFR of 0.76.  This was a small vessel. 2. Coronary calcium score of 200.  Moderate LAD stenosis on recent coronary CTA.  Second diagonal with significant stenosis with FFR 0.76, small vessel.  Start aspirin 81 mg daily,.  Patient is 86 years old.  I do not think starting a statin will help patient in terms of 10-year mortality benefit.  F/u in 6 months  This note was generated in part or whole with voice recognition software. Voice recognition is usually quite accurate but there are transcription errors that can and very often do occur. I apologize for any typographical errors that were not detected and corrected.  Medication Adjustments/Labs and Tests Ordered: Current medicines are reviewed at length with the patient today.  Concerns regarding medicines are outlined above.  Orders Placed This Encounter  Procedures  . EKG 12-Lead   Meds ordered this encounter  Medications  . aspirin EC 81 MG tablet    Sig: Take 1 tablet (81 mg total) by mouth daily.    Patient Instructions  Medication Instructions:  Your physician has recommended you make the following change in your medication:   START Aspirin 81 mg daily. Can be purchased over the counter.  *If you need a refill on your cardiac medications before your next appointment, please call your pharmacy*   Lab Work: None ordered If you have labs (blood work) drawn today and your tests are completely normal, you will receive your results only by: Marland Kitchen MyChart Message (if you have MyChart) OR . A paper copy in the mail If you have any lab test that is abnormal or we need to change your treatment, we will call you to review the results.   Testing/Procedures: None ordered   Follow-Up: At [facility], you and your health needs are our priority.  As part of our continuing mission to provide you with  exceptional heart care, we have created designated Provider Care Teams.  These Care Teams include your primary Cardiologist (physician) and Advanced Practice Providers (APPs -  Physician Assistants and Nurse Practitioners) who all work together to provide you with the care you need, when you need it.  We recommend signing up for the patient portal called "MyChart".  Sign up information is provided on this After Visit Summary.  MyChart is used to connect with patients for Virtual Visits (Telemedicine).  Patients are able to view lab/test results, encounter notes, upcoming appointments, etc.  Non-urgent messages can be sent to your provider as well.   To learn more about what you can do with MyChart, go to [email or web address].    Your next appointment:   6 month(s)  The format for your next appointment:   In Person  Provider:    You may see Kate Sable, MD or one of the following Advanced Practice Providers on your designated Care Team:    Murray Hodgkins, NP  Christell Faith, PA-C  Marrianne Mood, PA-C    Other Instructions N/A     Signed, Kate Sable, MD  07/19/2019 12:36 PM    [place]

## 2019-07-19 NOTE — Patient Instructions (Signed)
Medication Instructions:  Your physician has recommended you make the following change in your medication:   START Aspirin 81 mg daily. Can be purchased over the counter.  *If you need a refill on your cardiac medications before your next appointment, please call your pharmacy*   Lab Work: None ordered If you have labs (blood work) drawn today and your tests are completely normal, you will receive your results only by: Marland Kitchen MyChart Message (if you have MyChart) OR . A paper copy in the mail If you have any lab test that is abnormal or we need to change your treatment, we will call you to review the results.   Testing/Procedures: None ordered   Follow-Up: At [facility], you and your health needs are our priority.  As part of our continuing mission to provide you with exceptional heart care, we have created designated Provider Care Teams.  These Care Teams include your primary Cardiologist (physician) and Advanced Practice Providers (APPs -  Physician Assistants and Nurse Practitioners) who all work together to provide you with the care you need, when you need it.  We recommend signing up for the patient portal called "MyChart".  Sign up information is provided on this After Visit Summary.  MyChart is used to connect with patients for Virtual Visits (Telemedicine).  Patients are able to view lab/test results, encounter notes, upcoming appointments, etc.  Non-urgent messages can be sent to your provider as well.   To learn more about what you can do with MyChart, go to [email or web address].    Your next appointment:   6 month(s)  The format for your next appointment:   In Person  Provider:    You may see Kate Sable, MD or one of the following Advanced Practice Providers on your designated Care Team:    Murray Hodgkins, NP  Christell Faith, PA-C  Marrianne Mood, PA-C    Other Instructions N/A

## 2019-08-14 DIAGNOSIS — Z20828 Contact with and (suspected) exposure to other viral communicable diseases: Secondary | ICD-10-CM | POA: Diagnosis not present

## 2019-08-14 DIAGNOSIS — Z20822 Contact with and (suspected) exposure to covid-19: Secondary | ICD-10-CM | POA: Diagnosis not present

## 2019-08-14 NOTE — Telephone Encounter (Signed)
Pt called the office triage line stating she had sent a message on MyChart. I advised her that I had received that message and advised her how she could get tested. I did advise her that she would need to quarantine until the results come back. She agreed. She will go on [place] to schedule.

## 2019-08-26 ENCOUNTER — Encounter: Payer: Self-pay | Admitting: Physician Assistant

## 2019-08-26 ENCOUNTER — Ambulatory Visit (INDEPENDENT_AMBULATORY_CARE_PROVIDER_SITE_OTHER): Payer: Medicare Other | Admitting: Physician Assistant

## 2019-08-26 ENCOUNTER — Other Ambulatory Visit (INDEPENDENT_AMBULATORY_CARE_PROVIDER_SITE_OTHER): Payer: Medicare Other

## 2019-08-26 VITALS — BP 118/78 | HR 71 | Temp 98.3°F | Ht 62.0 in | Wt 147.0 lb

## 2019-08-26 DIAGNOSIS — K831 Obstruction of bile duct: Secondary | ICD-10-CM

## 2019-08-26 DIAGNOSIS — K580 Irritable bowel syndrome with diarrhea: Secondary | ICD-10-CM | POA: Diagnosis not present

## 2019-08-26 DIAGNOSIS — R1013 Epigastric pain: Secondary | ICD-10-CM

## 2019-08-26 DIAGNOSIS — I2510 Atherosclerotic heart disease of native coronary artery without angina pectoris: Secondary | ICD-10-CM | POA: Diagnosis not present

## 2019-08-26 LAB — CBC WITH DIFFERENTIAL/PLATELET
Basophils Absolute: 0 10*3/uL (ref 0.0–0.1)
Basophils Relative: 0.4 % (ref 0.0–3.0)
Eosinophils Absolute: 0.2 10*3/uL (ref 0.0–0.7)
Eosinophils Relative: 2.5 % (ref 0.0–5.0)
HCT: 45.1 % (ref 36.0–46.0)
Hemoglobin: 15.2 g/dL — ABNORMAL HIGH (ref 12.0–15.0)
Lymphocytes Relative: 12.8 % (ref 12.0–46.0)
Lymphs Abs: 0.9 10*3/uL (ref 0.7–4.0)
MCHC: 33.6 g/dL (ref 30.0–36.0)
MCV: 93.5 fl (ref 78.0–100.0)
Monocytes Absolute: 0.7 10*3/uL (ref 0.1–1.0)
Monocytes Relative: 10 % (ref 3.0–12.0)
Neutro Abs: 5.2 10*3/uL (ref 1.4–7.7)
Neutrophils Relative %: 74.3 % (ref 43.0–77.0)
Platelets: 199 10*3/uL (ref 150.0–400.0)
RBC: 4.83 Mil/uL (ref 3.87–5.11)
RDW: 13.1 % (ref 11.5–15.5)
WBC: 7 10*3/uL (ref 4.0–10.5)

## 2019-08-26 LAB — COMPREHENSIVE METABOLIC PANEL
ALT: 102 U/L — ABNORMAL HIGH (ref 0–35)
AST: 104 U/L — ABNORMAL HIGH (ref 0–37)
Albumin: 4.2 g/dL (ref 3.5–5.2)
Alkaline Phosphatase: 127 U/L — ABNORMAL HIGH (ref 39–117)
BUN: 19 mg/dL (ref 6–23)
CO2: 29 mEq/L (ref 19–32)
Calcium: 9.3 mg/dL (ref 8.4–10.5)
Chloride: 107 mEq/L (ref 96–112)
Creatinine, Ser: 0.68 mg/dL (ref 0.40–1.20)
GFR: 82.34 mL/min (ref >60.00)
Glucose, Bld: 82 mg/dL (ref 70–99)
Potassium: 4.1 mEq/L (ref 3.5–5.1)
Sodium: 140 mEq/L (ref 135–145)
Total Bilirubin: 0.6 mg/dL (ref 0.2–1.2)
Total Protein: 7 g/dL (ref 6.0–8.3)

## 2019-08-26 LAB — LIPASE: Lipase: 13 U/L (ref 11.0–59.0)

## 2019-08-26 MED ORDER — RIFAXIMIN 550 MG PO TABS: 550.0000 mg | ORAL_TABLET | Freq: Three times a day (TID) | ORAL | 0 refills | Status: AC

## 2019-08-26 NOTE — Progress Notes (Signed)
Subjective:    Patient ID: Ariel Phillips, female    DOB: 11-09-1932, 86 y.o.   MRN: [id number]  HPI [name] is a pleasant 86 year old white female, established with Dr. Carlean Purl.  She was last seen in October 2020 when she underwent ERCP with removal of CBD stone. She comes in today with concerns about diarrhea and IBS.  She is status post remote cholecystectomy in 2008. She last had colonoscopy in 2018 with removal of an 8 mm polyp which was hyperplastic.  She says she has had chronic problems with diarrhea which seems to have worsened over the past year or so.  At this point she is having chronic ongoing loose stools, and says she passes some loose stool every time she goes to the bathroom to urinate.  She is also developed some leakage of loose stool and is having to wear depends.  She is not having any formed stools.  She says this seems to be worse every time she tries to travel.  Over the past couple months she has been having some occasional mild cramping, no bleeding has been noted and no real abdominal pain. She did have an episode yesterday of what she describes as burning fairly sharp epigastric pain without radiation.  She said this was very similar to the pain she was having prior to having the bile duct stone removed.  This lasted for about 15 to 20 minutes and then resolved, was not associated with any nausea or vomiting but did have a lot of burping.  She typically takes Mylanta and Tums with these episodes. She has had a few other episodes since the ERCP which were not as significant and usually only last for a few minutes. She has tried Imodium in the past without much success, she is not currently on any medication for IBS.  She takes omeprazole 20 mg once daily for GERD.  Review of Systems Pertinent positive and negative review of systems were noted in the above HPI section.  All other review of systems was otherwise negative.  Outpatient Encounter Medications as of 08/26/2019    Medication Sig  . aspirin EC 81 MG tablet Take 1 tablet (81 mg total) by mouth daily.  . carboxymethylcellulose (REFRESH PLUS) 0.5 % SOLN Place 2 drops into both eyes daily as needed (dry eyes).   Marland Kitchen diclofenac Sodium (VOLTAREN) 1 % GEL Apply 2 g topically 4 (four) times daily as needed.  Marland Kitchen ibuprofen (ADVIL) 200 MG tablet Take 200 mg by mouth daily as needed for headache or moderate pain.  Marland Kitchen levothyroxine (SYNTHROID) 75 MCG tablet TAKE 1 TABLET EVERY DAY ON EMPTY STOMACHWITH A GLASS OF WATER AT LEAST 30-60 MINBEFORE BREAKFAST  . loratadine (CLARITIN) 10 MG tablet Take 10 mg by mouth at bedtime.   Marland Kitchen omeprazole (PRILOSEC) 20 MG capsule Take 1 capsule (20 mg total) by mouth at bedtime.  Marland Kitchen albuterol (VENTOLIN HFA) 108 (90 Base) MCG/ACT inhaler Inhale into the lungs every 6 (six) hours as needed for wheezing or shortness of breath. Two puffs three times a day as needed  . alum & mag hydroxide-simeth (MYLANTA MAXIMUM STRENGTH) 400-400-40 MG/5ML suspension Take 10 mLs by mouth as needed for indigestion.   Marland Kitchen EPINEPHrine 0.3 mg/0.3 mL IJ SOAJ injection Inject 0.3 mg into the muscle as needed for anaphylaxis.  . rifaximin (XIFAXAN) 550 MG TABS tablet Take 1 tablet (550 mg total) by mouth 3 (three) times daily for 14 days.   No facility-administered encounter medications on file  as of 08/26/2019.   Allergies  Allergen Reactions  . Penicillins Diarrhea    Did it involve swelling of the face/tongue/throat, SOB, or low BP? No Did it involve sudden or severe rash/hives, skin peeling, or any reaction on the inside of your mouth or nose? No Did you need to seek medical attention at a hospital or doctor's office? Yes When did it last happen?20+ years If all above answers are "NO", may proceed with cephalosporin use.   . Latex Rash   Patient Active Problem List   Diagnosis Date Noted  . Choledocholithiasis   . Aortic atherosclerosis ([place]) 01/14/2019  . Chest heaviness 12/04/2018  . Stiff neck  05/11/2018  . High arches 05/11/2018  . Allergic rhinitis 08/22/2017  . Mood disorder ([place]) 12/30/2016  . Memory loss 12/02/2015  . Benign essential tremor 07/20/2015  . Advanced directives, counseling/discussion 12/19/2013  . Routine general medical examination at a health care facility 12/13/2012  . Hypothyroidism 12/12/2011  . Allergic rhinitis due to pollen   . Allergic asthma   . GERD (gastroesophageal reflux disease)   . IBS (irritable bowel syndrome)   . Osteopenia    Social History   Socioeconomic History  . Marital status: Divorced    Spouse name: Not on file  . Number of children: 1  . Years of education: Not on file  . Highest education level: Not on file  Occupational History  . Occupation: Retired historian--consultant  Tobacco Use  . Smoking status: Never Smoker  . Smokeless tobacco: Never Used  Substance and Sexual Activity  . Alcohol use: Yes    Comment: rare wine  . Drug use: No  . Sexual activity: Not Currently  Other Topics Concern  . Not on file  Social History Narrative   Divorced and retired , moved from [place], [place]   1 Daughter in [place]   Has living will   Daughter has health care POA.---[name]   Requests DNR--order done 06/19/12   Would not want prolonged mechanical ventilation   May accept tube feeds temporarily but not if cognitively unaware   Social Determinants of Health   Financial Resource Strain:   . Difficulty of Paying Living Expenses:   Food Insecurity:   . Worried About Charity fundraiser in the Last Year:   . Arboriculturist in the Last Year:   Transportation Needs:   . Film/video editor (Medical):   Marland Kitchen Lack of Transportation (Non-Medical):   Physical Activity:   . Days of Exercise per Week:   . Minutes of Exercise per Session:   Stress:   . Feeling of Stress :   Social Connections:   . Frequency of Communication with Friends and Family:   . Frequency of Social Gatherings with Friends and  Family:   . Attends Religious Services:   . Active Member of Clubs or Organizations:   . Attends Archivist Meetings:   Marland Kitchen Marital Status:   Intimate Partner Violence:   . Fear of Current or Ex-Partner:   . Emotionally Abused:   Marland Kitchen Physically Abused:   . Sexually Abused:     Ms. Brophy family history includes COPD in her mother; Cancer in her mother and paternal grandmother; Diabetes in her sister; Hypertension in her sister; Stroke in an other family member; Transient ischemic attack in her mother.      Objective:    Vitals:   08/26/19 0941  BP: 118/78  Pulse: 71  Temp: 98.3 F (  36.8 C)    Physical Exam Well-developed well-nourished elderly female in no acute distress.  Height, [id number],206 BMI 26.8  HEENT; nontraumatic normocephalic, EOMI, PER R LA, sclera anicteric. Oropharynx; not examined Neck; supple, no JVD Cardiovascular; regular rate and rhythm with S1-S2, no murmur rub or gallop Pulmonary; Clear bilaterally Abdomen; soft, nontender, nondistended, no palpable mass or hepatosplenomegaly, bowel sounds are active Rectal; not done today Skin; benign exam, no jaundice rash or appreciable lesions Extremities; no clubbing cyanosis or edema skin warm and dry Neuro/Psych; alert and oriented x4, grossly nonfocal mood and affect appropriate       Assessment & Plan:   #38 86 year old white female with history of IBS, diarrhea predominant with gradual progression of diarrhea over the past year or so, to the point that she is now having some fecal soilage as well.  Generally having multiple small loose stools per day  Rule out IBS-D versus bile salt induced diarrhea.  Patient cannot remember if the diarrhea had started after her gallbladder was removed or not.  #2 status post remote cholecystectomy 2008 #3 history of choledocholithiasis status post ERCP with stone extraction October 2020-patient has continued to have occasional episodes of epigastric pain  reminiscent of choledocholithiasis type symptoms.  Rule out recurrent choledocholithiasis  #4 GERD 5.  Memory loss 6.  Hypothyroidism  Plan; we will give her a course of Xifaxan 550 mg p.o. 3 times daily x14 days Depending on response to Xifaxan can then consider starting trial of antispasmodic  We will check CBC with differential, c-Met and lipase today-May need repeat MRCP if elevated LFTs and/or repeated episodes of pain.  Patient was advised to call should she have any increase in frequency or intensity of these episodes.  Edelin Fryer S Electa Sterry PA-C 08/26/2019   Cc: Venia Carbon, MD

## 2019-08-26 NOTE — Patient Instructions (Addendum)
If you are age 84 or older, your body mass index should be between 23-30. Your Body mass index is 26.89 kg/m. If this is out of the aforementioned range listed, please consider follow up with your Primary Care Provider.  If you are age 51 or younger, your body mass index should be between 19-25. Your Body mass index is 26.89 kg/m. If this is out of the aformentioned rang550 mg three times daily  Your provider has requested that you go to the basement level for lab work before leaving today. Press "B" on the elevator. The lab is located at the first door on the left as you exit the elevator.  Due to recent changes in healthcare laws, you may see the results of your imaging and laboratory studies on MyChart before your provider has had a chance to review them.  We understand that in some cases there may be results that are confusing or concerning to you. Not all laboratory results come back in the same time frame and the provider may be waiting for multiple results in order to interpret others.  Please give [place] 48 hours in order for your provider to thoroughly review all the results before contacting the office for clarification of your results.   START Xifaxin 550 mg 3 times daily. This has been sent to [facility].  You have been scheduled for a follow up with Dr. Carlean Purl on Sep 17, 2019 at 9:30 am

## 2019-09-17 ENCOUNTER — Encounter: Payer: Self-pay | Admitting: Nurse Practitioner

## 2019-09-17 ENCOUNTER — Ambulatory Visit: Payer: Medicare Other | Admitting: Internal Medicine

## 2019-09-17 ENCOUNTER — Other Ambulatory Visit: Payer: Self-pay

## 2019-09-17 ENCOUNTER — Ambulatory Visit: Payer: Medicare Other | Admitting: Nurse Practitioner

## 2019-09-17 VITALS — BP 120/80 | HR 84 | Temp 98.4°F | Wt 146.0 lb

## 2019-09-17 DIAGNOSIS — R631 Polydipsia: Secondary | ICD-10-CM | POA: Diagnosis not present

## 2019-09-17 DIAGNOSIS — Z833 Family history of diabetes mellitus: Secondary | ICD-10-CM | POA: Diagnosis not present

## 2019-09-17 DIAGNOSIS — R350 Frequency of micturition: Secondary | ICD-10-CM

## 2019-09-17 DIAGNOSIS — I2510 Atherosclerotic heart disease of native coronary artery without angina pectoris: Secondary | ICD-10-CM

## 2019-09-17 NOTE — Progress Notes (Signed)
Careteam: Patient Care Team: Venia Carbon, MD as PCP - General (Pediatrics) Kate Sable, MD as PCP - Cardiology (Cardiology)  Advanced Directive information    Allergies  Allergen Reactions  . Penicillins Diarrhea    Did it involve swelling of the face/tongue/throat, SOB, or low BP? No Did it involve sudden or severe rash/hives, skin peeling, or any reaction on the inside of your mouth or nose? No Did you need to seek medical attention at a hospital or doctor's office? Yes When did it last happen?20+ years If all above answers are "NO", may proceed with cephalosporin use.   . Latex Rash    Chief Complaint  Patient presents with  . Acute Visit    Urinary frequency and excessive thrist     HPI: Patient is a 86 y.o. female seen in today at the [facility] for increase thirst in the morning Reports for the last 1 month she would wake up and be super thirsty and have to have something to drink. Does not feel like she has excessive thirst during the day.  Reports she does not drink much during the day.  Denies worsening of blurred vision, weight loss, excessive hungry or thirst during the day.   In the last week she has been having to get up at night to go to the bathroom. Reports she has been getting up twice at night to use the bathroom. No burning. No blood in urine, no urgency, no frequency during the day. Reports she has IBS so she will have various aches and pain but no new symptoms. Started xifaxan 1 week ago.  Her sister has diabetes and she was worried about that.    Review of Systems:  Review of Systems  Constitutional: Negative for chills, fever, malaise/fatigue and weight loss.  Eyes: Negative for blurred vision.  Respiratory: Negative for cough, sputum production and shortness of breath.   Cardiovascular: Negative for chest pain, palpitations and leg swelling.  Gastrointestinal: Positive for diarrhea (followed by GI). Negative for  abdominal pain and constipation.  Genitourinary: Positive for frequency (nighttime, x 1 week). Negative for dysuria and urgency.  Skin: Negative.   Neurological: Negative for dizziness, sensory change, speech change and headaches.  Endo/Heme/Allergies: Negative for polydipsia.    Past Medical History:  Diagnosis Date  . Allergic asthma   . Allergic rhinitis due to pollen   . Allergy   . GERD (gastroesophageal reflux disease)   . IBS (irritable bowel syndrome)   . Osteopenia   . Unspecified hypothyroidism    Past Surgical History:  Procedure Laterality Date  . ANAL FISSURE REPAIR  1950's  . BASAL CELL CARCINOMA EXCISION  2000's   nose  . CHOLECYSTECTOMY  2008  . COLONOSCOPY  Multiple   Negative screening exams  . ENDOSCOPIC RETROGRADE CHOLANGIOPANCREATOGRAPHY (ERCP) WITH PROPOFOL N/A 02/13/2019   Procedure: ENDOSCOPIC RETROGRADE CHOLANGIOPANCREATOGRAPHY (ERCP) WITH PROPOFOL;  Surgeon: Gatha Mayer, MD;  Location: [facility];  Service: Endoscopy;  Laterality: N/A;  . [place] or so   with fissure repair  . REMOVAL OF STONES  02/13/2019   Procedure: REMOVAL OF STONES;  Surgeon: Gatha Mayer, MD;  Location: [facility];  Service: Endoscopy;;  . [name]  02/13/2019   Procedure: [name];  Surgeon: Gatha Mayer, MD;  Location: [facility];  Service: Endoscopy;;  . TUBAL LIGATION     Social History:   reports that she has never smoked. She has never used smokeless tobacco. She  reports current alcohol use. She reports that she does not use drugs.  Family History  Problem Relation Age of Onset  . COPD Mother   . Cancer Mother        unclear diagnosis  . Transient ischemic attack Mother   . Diabetes Sister   . Hypertension Sister   . Stroke Other   . Cancer Paternal Grandmother        ?breast  . Heart disease Neg Hx     Medications: Patient's Medications  New Prescriptions   No medications on file  Previous Medications   ALUM &  MAG HYDROXIDE-SIMETH (MYLANTA MAXIMUM STRENGTH) 400-400-40 MG/5ML SUSPENSION    Take 10 mLs by mouth as needed for indigestion.    ASPIRIN EC 81 MG TABLET    Take 1 tablet (81 mg total) by mouth daily.   CARBOXYMETHYLCELLULOSE (REFRESH PLUS) 0.5 % SOLN    Place 2 drops into both eyes daily as needed (dry eyes).    DICLOFENAC SODIUM (VOLTAREN) 1 % GEL    Apply 2 g topically 4 (four) times daily as needed.   EPINEPHRINE 0.3 MG/0.3 ML IJ SOAJ INJECTION    Inject 0.3 mg into the muscle as needed for anaphylaxis.   IBUPROFEN (ADVIL) 200 MG TABLET    Take 200 mg by mouth daily as needed for headache or moderate pain.   LEVOTHYROXINE (SYNTHROID) 75 MCG TABLET    TAKE 1 TABLET EVERY DAY ON EMPTY STOMACHWITH A GLASS OF WATER AT LEAST 30-60 MINBEFORE BREAKFAST   LORATADINE (CLARITIN) 10 MG TABLET    Take 10 mg by mouth at bedtime.    OMEPRAZOLE (PRILOSEC) 20 MG CAPSULE    Take 1 capsule (20 mg total) by mouth at bedtime.  Modified Medications   No medications on file  Discontinued Medications   ALBUTEROL (VENTOLIN HFA) 108 (90 BASE) MCG/ACT INHALER    Inhale into the lungs every 6 (six) hours as needed for wheezing or shortness of breath. Two puffs three times a day as needed    Physical Exam:  Vitals:   09/17/19 1521  BP: 120/80  Pulse: 84  Temp: 98.4 F (36.9 C)  TempSrc: Oral  SpO2: 97%  Weight: 146 lb (66.2 kg)   Body mass index is 26.7 kg/m. Wt Readings from Last 3 Encounters:  09/17/19 146 lb (66.2 kg)  08/26/19 147 lb (66.7 kg)  07/19/19 150 lb 2 oz (68.1 kg)    Physical Exam Constitutional:      Appearance: Normal appearance.  HENT:     Head: Normocephalic and atraumatic.     Mouth/Throat:     Mouth: Mucous membranes are moist.  Abdominal:     General: Bowel sounds are normal. There is no distension.     Palpations: Abdomen is soft.     Tenderness: There is no abdominal tenderness. There is no right CVA tenderness, left CVA tenderness or guarding.  Skin:    General: Skin  is warm and dry.  Neurological:     Mental Status: She is alert and oriented to person, place, and time.  Psychiatric:        Mood and Affect: Mood normal.        Behavior: Behavior normal.     Labs reviewed: Basic Metabolic Panel: Recent Labs    01/14/19 1517 02/04/19 1514 05/22/19 1616 07/09/19 1423 08/26/19 1038  NA  --   --  140 138 140  K  --   --  4.8 5.3* 4.1  CL  --   --  106 107 107  CO2  --   --  25 21* 29  GLUCOSE  --   --  104* 102* 82  BUN  --    < > 22 21 19   CREATININE  --    < > 0.65 0.61 0.68  CALCIUM  --   --  9.2 9.3 9.3  TSH 4.54*  --   --   --   --    < > = values in this interval not displayed.   Liver Function Tests: Recent Labs    01/17/19 1209 08/26/19 1038  AST 19 104*  ALT 24 102*  ALKPHOS 102 127*  BILITOT 0.6 0.6  PROT 7.3 7.0  ALBUMIN 4.2 4.2   Recent Labs    01/17/19 1209 08/26/19 1038  LIPASE 14.0 13.0   No results for input(s): AMMONIA in the last 8760 hours. CBC: Recent Labs    11/29/18 1122 05/22/19 1528 08/26/19 1038  WBC 6.3 5.8 7.0  NEUTROABS  --   --  5.2  HGB 13.9 14.8 15.2*  HCT 42.3 46.1* 45.1  MCV 94.8 95.8 93.5  PLT 179 156 199.0   Lipid Panel: No results for input(s): CHOL, HDL, LDLCALC, TRIG, CHOLHDL, LDLDIRECT in the last 8760 hours. TSH: Recent Labs    01/14/19 1517  TSH 4.54*   A1C: No results found for: HGBA1C   Assessment/Plan 1. Urinary frequency Urinary frequency over night only, encouraged her to drink most of her fluids during the day getting adequate hydration and limiting fluids after supper to avoid frequently getting up at night.  2. Increased thirst When she first gets up in the morning, encouraged her to keep up with hydration during the day.  3. Family history of diabetes mellitus Concerns of diabetes, Reassurance and education given, random blood sugar done by facility nurse and was 121, she also had BMP taken 3 weeks ago with glucose of 82.    Carlos American. [place],  [facility] [phone]

## 2019-09-23 MED ORDER — RIFAXIMIN 550 MG PO TABS: 550.0000 mg | ORAL_TABLET | Freq: Three times a day (TID) | ORAL | 0 refills | Status: AC

## 2019-10-10 DIAGNOSIS — Z20822 Contact with and (suspected) exposure to covid-19: Secondary | ICD-10-CM | POA: Diagnosis not present

## 2019-10-16 ENCOUNTER — Telehealth: Payer: Self-pay | Admitting: Cardiology

## 2019-10-16 NOTE — Telephone Encounter (Signed)
lmov to reschedule   °

## 2019-10-16 NOTE — Telephone Encounter (Signed)
-----   Message from Britt [place], [place] sent at 10/15/2019  4:11 PM EDT ----- Pt has upcoming appointment scheduled 10/28/2019 with [facility].  Pt isn't due for 6 month f/u until 01/2020. Pt last seen 07/2019. Please advise due to early f/u.

## 2019-10-24 ENCOUNTER — Ambulatory Visit: Payer: Medicare Other | Admitting: Internal Medicine

## 2019-10-28 ENCOUNTER — Ambulatory Visit: Payer: Medicare Other | Admitting: Cardiology

## 2019-10-30 ENCOUNTER — Encounter: Payer: Self-pay | Admitting: Internal Medicine

## 2019-10-30 ENCOUNTER — Ambulatory Visit (INDEPENDENT_AMBULATORY_CARE_PROVIDER_SITE_OTHER): Payer: Medicare Other | Admitting: Internal Medicine

## 2019-10-30 VITALS — BP 110/70 | HR 64 | Ht 62.0 in | Wt 148.0 lb

## 2019-10-30 DIAGNOSIS — K580 Irritable bowel syndrome with diarrhea: Secondary | ICD-10-CM | POA: Diagnosis not present

## 2019-10-30 DIAGNOSIS — I2510 Atherosclerotic heart disease of native coronary artery without angina pectoris: Secondary | ICD-10-CM | POA: Diagnosis not present

## 2019-10-30 NOTE — Patient Instructions (Signed)
Glad things are better and hope they stay that way.  Should you have persistent and recurrent diarrhea please let me know and we will consider testing for small intestinal bacterial overgrowth (SIBO)  I appreciate the opportunity to care for you. Gatha Mayer, MD, [facility]

## 2019-10-30 NOTE — Assessment & Plan Note (Signed)
Plan to observe after finishing this round of Xifaxan.  I explained the concept of small intestinal bacterial overgrowth and how that is the rationale for using Xifaxan.  I have asked her to notify me if she is getting diarrhea again and feels like she needs to be treated again and I would like her to consider a lactulose hydrogen breath test at that time.

## 2019-10-30 NOTE — Progress Notes (Signed)
Ariel Phillips 86 y.o. 02/10/1933 [id number]  Assessment & Plan:   IBS (irritable bowel syndrome) Plan to observe after finishing this round of Xifaxan.  I explained the concept of small intestinal bacterial overgrowth and how that is the rationale for using Xifaxan.  I have asked her to notify me if she is getting diarrhea again and feels like she needs to be treated again and I would like her to consider a lactulose hydrogen breath test at that time.     Subjective:   Chief Complaint: IBS  HPI 86 year old white woman with IBS-D recently seen by Nicoletta Ba and treated empirically for IBS-D with Xifaxan which has helped.  Seems like her diarrhea centers around travel though she says she is not stressed about that.  She took a round of Xifaxan and improved but then had relapse so she has taken another course which she took while she was out in [place] visiting family, and reports that she was under good control though "it might be coming back" referring to some diarrhea.  She is not quite finished a another 14-day course to 550 mg 3 times daily on the Xifaxan.  She has gained some weight during Covid and is concerned about that.  Does not seem to eat a lot of added sugars or carbs 5 was eating more during Covid and now that she is back to her normal eating habits has not yet started to lose. Allergies  Allergen Reactions  . Penicillins Diarrhea    Did it involve swelling of the face/tongue/throat, SOB, or low BP? No Did it involve sudden or severe rash/hives, skin peeling, or any reaction on the inside of your mouth or nose? No Did you need to seek medical attention at a hospital or doctor's office? Yes When did it last happen?20+ years If all above answers are "NO", may proceed with cephalosporin use.   . Latex Rash   Current Meds  Medication Sig  . alum & mag hydroxide-simeth (MYLANTA MAXIMUM STRENGTH) 400-400-40 MG/5ML suspension Take 10 mLs by mouth as needed for  indigestion.   Marland Kitchen aspirin EC 81 MG tablet Take 1 tablet (81 mg total) by mouth daily.  . carboxymethylcellulose (REFRESH PLUS) 0.5 % SOLN Place 2 drops into both eyes daily as needed (dry eyes).   Marland Kitchen diclofenac Sodium (VOLTAREN) 1 % GEL Apply 2 g topically 4 (four) times daily as needed.  Marland Kitchen EPINEPHrine 0.3 mg/0.3 mL IJ SOAJ injection Inject 0.3 mg into the muscle as needed for anaphylaxis.  Marland Kitchen ibuprofen (ADVIL) 200 MG tablet Take 200 mg by mouth daily as needed for headache or moderate pain.  Marland Kitchen levothyroxine (SYNTHROID) 75 MCG tablet TAKE 1 TABLET EVERY DAY ON EMPTY STOMACHWITH A GLASS OF WATER AT LEAST 30-60 MINBEFORE BREAKFAST  . loratadine (CLARITIN) 10 MG tablet Take 10 mg by mouth at bedtime.   Marland Kitchen omeprazole (PRILOSEC) 20 MG capsule Take 1 capsule (20 mg total) by mouth at bedtime.  . rifaximin (XIFAXAN) 550 MG TABS tablet Take 1 tablet (550 mg total) by mouth 3 (three) times daily.   Past Medical History:  Diagnosis Date  . Allergic asthma   . Allergic rhinitis due to pollen   . Allergy   . GERD (gastroesophageal reflux disease)   . IBS (irritable bowel syndrome)   . Osteopenia   . Unspecified hypothyroidism    Past Surgical History:  Procedure Laterality Date  . ANAL FISSURE REPAIR  1950's  . BASAL CELL CARCINOMA EXCISION  2000's  nose  . CHOLECYSTECTOMY  2008  . COLONOSCOPY  Multiple   Negative screening exams  . ENDOSCOPIC RETROGRADE CHOLANGIOPANCREATOGRAPHY (ERCP) WITH PROPOFOL N/A 02/13/2019   Procedure: ENDOSCOPIC RETROGRADE CHOLANGIOPANCREATOGRAPHY (ERCP) WITH PROPOFOL;  Surgeon: Gatha Mayer, MD;  Location: [facility];  Service: Endoscopy;  Laterality: N/A;  . [place] or so   with fissure repair  . REMOVAL OF STONES  02/13/2019   Procedure: REMOVAL OF STONES;  Surgeon: Gatha Mayer, MD;  Location: [facility];  Service: Endoscopy;;  . [name]  02/13/2019   Procedure: [name];  Surgeon: Gatha Mayer, MD;  Location: [facility];   Service: Endoscopy;;  . TUBAL LIGATION     Social History   Social History Narrative   Divorced and retired , moved from [place], [place]   1 Daughter in [place]   Has living will   Daughter has health care POA.---[name]   Requests DNR--order done 06/19/12   Would not want prolonged mechanical ventilation   May accept tube feeds temporarily but not if cognitively unaware   family history includes COPD in her mother; Cancer in her mother and paternal grandmother; Diabetes in her sister; Hypertension in her sister; Stroke in an other family member; Transient ischemic attack in her mother.   Review of Systems As above  Objective:   Physical Exam BP 110/70   Pulse 64   Ht 5\' 2"  (1.575 m)   Wt 148 lb (67.1 kg)   BMI 27.07 kg/m   22 minutes total time

## 2019-11-06 DIAGNOSIS — K580 Irritable bowel syndrome with diarrhea: Secondary | ICD-10-CM

## 2019-11-18 ENCOUNTER — Encounter: Payer: Medicare Other | Attending: Internal Medicine | Admitting: Dietician

## 2019-11-18 ENCOUNTER — Other Ambulatory Visit: Payer: Self-pay

## 2019-11-18 ENCOUNTER — Encounter: Payer: Self-pay | Admitting: Dietician

## 2019-11-18 DIAGNOSIS — K580 Irritable bowel syndrome with diarrhea: Secondary | ICD-10-CM | POA: Insufficient documentation

## 2019-11-18 NOTE — Progress Notes (Signed)
Medical Nutrition Therapy: Visit start time: 1330  end time: 1500  Assessment:  Diagnosis: IBS Past medical history: gluten intolerance, lactose intolerance Psychosocial issues/ stress concerns: pt identified stress level as "low" and "ok" stress management  Preferred learning method:  . Auditory . Visual . Hands-on  Current weight: 148.7 lbs  Height: 5'2" Pt reported UBW: 135 lbs, 110%UBW Medications, supplements: reconciled in medical record   Progress and evaluation:   Pt states she has a member of [employer] for 30+ years  She reports weight loss of 200 lbs to 135 lbs in her 55s  Pt states maintaining weight of 135 lbs until COVID-19 pandemic  Pt has been following a modified low FODMAP diet for several years   Pt denies interest in reintroducing/challenging potentially 'safe' foods into her diet   Physical activity: 30 min walk 5-7 days/week   Dietary Intake:  Usual eating pattern includes 3 meals and 1 snacks per day. Dining out frequency: 0-2 meals per week  Breakfast: 1/2 c. Cornflakes, 1 tbsp flaxseeds, 1 tbsp craisins, 1/4 c. chopped almonds and walnuts Lunch: 1/2 c. Unsweetened almond milk yogurt, 1/4 c. Greek yogurt, 1/4 c. Mixed nuts, 5-7 grapes, 4-5 cherries, 8 oz V8 juice Supper: 1/2 cup cucumber salad with frozen dinner/1/2 c soup w 1oz cheese and couple nut crackers/Pork and grits stew/short ribs; 4-6 oz steak, baked potato, cucumber salad  Snack: 1.5 c butter popcorn Beverages: V8 juice, water, 1/2 cup lactaid milk, diet coke   Estimated energy intake: 1100-1300kcal/day Estimated energy needs: HB: 1079*1.0-1.2= 1080-1295kcal/day  Nutrition Care Education: Basic nutrition: basic food groups, appropriate nutrient balance, general nutrition guidelines    Weight control: determining reasonable weight loss rate, importance of low sugar and low fat choices Advanced nutrition:  food label reading for frozen meals   Nutritional Diagnosis:  [place]-3.4  Unintentional weight gain As related to excess calorie intake related to history of overeating.  As evidenced by pt weight increase of 10% in 6 months.  Intervention:  Discussion as noted above.  Also, reviewed the purpose of a Low FODMAP diet and the importance of reintroducing foods that may be well tolerated. Diet may be liberalized to include a more diverse array of foods, but patient is not interested in that at this time. Energy intake good for weight maintenance, but pt expressed wanting to lose weight.  Reviewed need for calorie deficit in order to lose weight.  Established goals for additional change.     Education Materials given:  Marland Kitchen Low FODMAP diet handout . Goals/ instructions  Learner/ who was taught:  . Patient   Level of understanding: Marland Kitchen Verbalizes/ demonstrates competency  Demonstrated degree of understanding via:   Teach back Learning barriers: . None  Willingness to learn/ readiness for change: . Eager, change in progress  Monitoring and Evaluation:  Dietary intake, exercise, GI upset, and body weight      follow up: prn

## 2019-11-18 NOTE — Patient Instructions (Signed)
   Try to keep meals to 300-400 calories  Try to keep snacks <250 calories  Frozen dinners to look for <300 calories a meal   Continue to walk 30 min/day 5-7 days/week

## 2020-01-07 ENCOUNTER — Other Ambulatory Visit: Payer: Self-pay | Admitting: Internal Medicine

## 2020-01-10 ENCOUNTER — Other Ambulatory Visit: Payer: Self-pay | Admitting: Internal Medicine

## 2020-01-20 ENCOUNTER — Encounter: Payer: Self-pay | Admitting: Cardiology

## 2020-01-20 ENCOUNTER — Other Ambulatory Visit: Payer: Self-pay

## 2020-01-20 ENCOUNTER — Encounter: Payer: Medicare Other | Admitting: Internal Medicine

## 2020-01-20 ENCOUNTER — Ambulatory Visit (INDEPENDENT_AMBULATORY_CARE_PROVIDER_SITE_OTHER): Payer: Medicare Other | Admitting: Cardiology

## 2020-01-20 VITALS — BP 140/80 | HR 91 | Ht 63.0 in | Wt 147.2 lb

## 2020-01-20 DIAGNOSIS — K2100 Gastro-esophageal reflux disease with esophagitis, without bleeding: Secondary | ICD-10-CM | POA: Diagnosis not present

## 2020-01-20 DIAGNOSIS — I2510 Atherosclerotic heart disease of native coronary artery without angina pectoris: Secondary | ICD-10-CM | POA: Diagnosis not present

## 2020-01-20 NOTE — Progress Notes (Signed)
Cardiology Office Note:    Date:  01/20/2020   ID:  Ariel Phillips, DOB 10-Dec-1932, MRN [id number]  PCP:  Venia Carbon, MD  Cardiologist:  Kate Sable, MD  Electrophysiologist:  None   Referring MD: Venia Carbon, MD   Chief Complaint  Patient presents with  . office visit    6 month F/U; Meds verbally reviewed with patient.    History of Present Illness:    Ariel Phillips is a 86 y.o. female with a hx of GERD, non obstructive CAD, who presents for follow-up.  She was previously seen for chest pain prompting ED visits.  Work-up including a coronary CTA showed nonobstructive LAD disease.  Aspirin 81 mg daily was started.  She states feeling much better since last visit.  Denies chest pain or shortness of breath.  Takes Prilosec for her reflux which shows help with her symptoms.  Has lost about 3 pounds since last visit.  Otherwise feels well, has no concerns at this time.  Prior notes Echo 07/2019 showed normal systolic function, impaired relaxation EF 60 to 65%. Coronary CTA showed a calcium score of 200, moderate LAD disease, FFR CT did not show any significant stenosis in the LAD.  FFR CT showed stenosis in diagonal 1, small vessel.   Past Medical History:  Diagnosis Date  . Allergic asthma   . Allergic rhinitis due to pollen   . Allergy   . GERD (gastroesophageal reflux disease)   . IBS (irritable bowel syndrome)   . Osteopenia   . Unspecified hypothyroidism     Past Surgical History:  Procedure Laterality Date  . ANAL FISSURE REPAIR  1950's  . BASAL CELL CARCINOMA EXCISION  2000's   nose  . CHOLECYSTECTOMY  2008  . COLONOSCOPY  Multiple   Negative screening exams  . ENDOSCOPIC RETROGRADE CHOLANGIOPANCREATOGRAPHY (ERCP) WITH PROPOFOL N/A 02/13/2019   Procedure: ENDOSCOPIC RETROGRADE CHOLANGIOPANCREATOGRAPHY (ERCP) WITH PROPOFOL;  Surgeon: Gatha Mayer, MD;  Location: [facility];  Service: Endoscopy;  Laterality: N/A;  . [place]  or so   with fissure repair  . REMOVAL OF STONES  02/13/2019   Procedure: REMOVAL OF STONES;  Surgeon: Gatha Mayer, MD;  Location: [facility];  Service: Endoscopy;;  . [name]  02/13/2019   Procedure: [name];  Surgeon: Gatha Mayer, MD;  Location: [facility];  Service: Endoscopy;;  . TUBAL LIGATION      Current Medications: Current Meds  Medication Sig  . alum & mag hydroxide-simeth (MYLANTA MAXIMUM STRENGTH) 400-400-40 MG/5ML suspension Take 10 mLs by mouth as needed for indigestion.   Marland Kitchen aspirin EC 81 MG tablet Take 1 tablet (81 mg total) by mouth daily.  . calcium carbonate (OS-CAL) 1250 (500 Ca) MG chewable tablet Chew 1 tablet by mouth daily.  . carboxymethylcellulose (REFRESH PLUS) 0.5 % SOLN Place 2 drops into both eyes daily as needed (dry eyes).   Marland Kitchen diclofenac Sodium (VOLTAREN) 1 % GEL Apply 2 g topically 4 (four) times daily as needed.  Marland Kitchen EPINEPHrine 0.3 mg/0.3 mL IJ SOAJ injection Inject 0.3 mg into the muscle as needed for anaphylaxis.  Marland Kitchen ibuprofen (ADVIL) 200 MG tablet Take 200 mg by mouth daily as needed for headache or moderate pain.  Marland Kitchen levothyroxine (SYNTHROID) 75 MCG tablet TAKE 1 TABLET EVERY DAY ON EMPTY STOMACHWITH A GLASS OF WATER AT LEAST 30-60 MINBEFORE BREAKFAST  . loratadine (CLARITIN) 10 MG tablet Take 10 mg by mouth at bedtime.   . mupirocin ointment (  BACTROBAN) 2 % Apply topically as directed.   Marland Kitchen omeprazole (PRILOSEC) 20 MG capsule TAKE 1 CAPSULE BY MOUTH AT BEDTIME     Allergies:   Penicillins and Latex   Social History   Socioeconomic History  . Marital status: Divorced    Spouse name: Not on file  . Number of children: 1  . Years of education: Not on file  . Highest education level: Not on file  Occupational History  . Occupation: Retired historian--consultant  Tobacco Use  . Smoking status: Never Smoker  . Smokeless tobacco: Never Used  Vaping Use  . Vaping Use: Never used  Substance and Sexual Activity  . Alcohol  use: Yes    Comment: rare wine  . Drug use: No  . Sexual activity: Not Currently  Other Topics Concern  . Not on file  Social History Narrative   Divorced and retired , moved from [place], [place]   1 Daughter in [place]   Has living will   Daughter has health care POA.---[name]   Requests DNR--order done 06/19/12   Would not want prolonged mechanical ventilation   May accept tube feeds temporarily but not if cognitively unaware   Social Determinants of Health   Financial Resource Strain:   . Difficulty of Paying Living Expenses: Not on file  Food Insecurity:   . Worried About Charity fundraiser in the Last Year: Not on file  . Ran Out of Food in the Last Year: Not on file  Transportation Needs:   . Lack of Transportation (Medical): Not on file  . Lack of Transportation (Non-Medical): Not on file  Physical Activity:   . Days of Exercise per Week: Not on file  . Minutes of Exercise per Session: Not on file  Stress:   . Feeling of Stress : Not on file  Social Connections:   . Frequency of Communication with Friends and Family: Not on file  . Frequency of Social Gatherings with Friends and Family: Not on file  . Attends Religious Services: Not on file  . Active Member of Clubs or Organizations: Not on file  . Attends Archivist Meetings: Not on file  . Marital Status: Not on file     Family History: The patient's family history includes COPD in her mother; Cancer in her mother and paternal grandmother; Diabetes in her sister; Hypertension in her sister; Stroke in an other family member; Transient ischemic attack in her mother. There is no history of Heart disease.  ROS:   Please see the history of present illness.     All other systems reviewed and are negative.  EKGs/Labs/Other Studies Reviewed:    The following studies were reviewed today:   EKG:  EKG is  ordered today.  The ekg ordered today demonstrates normal sinus rhythm, normal  ECG.  Recent Labs: 08/26/2019: ALT 102; BUN 19; Creatinine, Ser 0.68; Hemoglobin 15.2; Platelets 199.0; Potassium 4.1; Sodium 140  Recent Lipid Panel    Component Value Date/Time   CHOL 184 12/12/2011 1517   TRIG 56.0 12/12/2011 1517   HDL 74.70 12/12/2011 1517   CHOLHDL 2 12/12/2011 1517   VLDL 11.2 12/12/2011 1517   LDLCALC 98 12/12/2011 1517    Physical Exam:    VS:  BP 140/80 (BP Location: Left Arm, Patient Position: Sitting, Cuff Size: Normal)   Pulse 91   Ht 5\' 3"  (1.6 m)   Wt 147 lb 4 oz (66.8 kg)   SpO2 98%   BMI  26.08 kg/m     Wt Readings from Last 3 Encounters:  01/20/20 147 lb 4 oz (66.8 kg)  10/30/19 148 lb (67.1 kg)  09/17/19 146 lb (66.2 kg)     GEN:  Well nourished, well developed in no acute distress HEENT: Normal NECK: No JVD; No carotid bruits LYMPHATICS: No lymphadenopathy CARDIAC: RRR, no murmurs, rubs, gallops RESPIRATORY:  Clear to auscultation without rales, wheezing or rhonchi  ABDOMEN: Soft, non-tender, non-distended MUSCULOSKELETAL:  No edema; No deformity  SKIN: Warm and dry NEUROLOGIC:  Alert and oriented x 3 PSYCHIATRIC:  Normal affect   ASSESSMENT:    1. Coronary artery disease involving native coronary artery of native heart without angina pectoris   2. Gastroesophageal reflux disease with esophagitis without hemorrhage    PLAN:    In order of problems listed above:  1. History of CAD, LAD disease.  FFR CT showed stenosis in the first diagonal, small vessel.  Currently denies symptoms of chest pain.  Echocardiogram showed normal systolic function with EF 60 to 65%, impaired relaxation.  Continue aspirin 81 mg daily.  Will consider Imdur if patient she becomes symptomatic.  As previously mentioned, do not see added benefits/indication for statin therapy in an 86 year old lady in terms of 10-year risk profile.  Low-cholesterol diet advised.  Weight loss advised. 2. Patient with history of reflux, symptoms are well controlled.  Continue  Prilosec.  F/u in 6 months  Total encounter time 35 minutes  Greater than 50% was spent in counseling and coordination of care with the patient   This note was generated in part or whole with voice recognition software. Voice recognition is usually quite accurate but there are transcription errors that can and very often do occur. I apologize for any typographical errors that were not detected and corrected.  Medication Adjustments/Labs and Tests Ordered: Current medicines are reviewed at length with the patient today.  Concerns regarding medicines are outlined above.  Orders Placed This Encounter  Procedures  . EKG 12-Lead   No orders of the defined types were placed in this encounter.   Patient Instructions  Medication Instructions:   Your physician recommends that you continue on your current medications as directed. Please refer to the Current Medication list given to you today. *If you need a refill on your cardiac medications before your next appointment, please call your pharmacy*   Lab Work: None Ordered If you have labs (blood work) drawn today and your tests are completely normal, you will receive your results only by: Marland Kitchen MyChart Message (if you have MyChart) OR . A paper copy in the mail If you have any lab test that is abnormal or we need to change your treatment, we will call you to review the results.   Testing/Procedures: None Ordered   Follow-Up: At [facility], you and your health needs are our priority.  As part of our continuing mission to provide you with exceptional heart care, we have created designated Provider Care Teams.  These Care Teams include your primary Cardiologist (physician) and Advanced Practice Providers (APPs -  Physician Assistants and Nurse Practitioners) who all work together to provide you with the care you need, when you need it.  We recommend signing up for the patient portal called "MyChart".  Sign up information is provided on  this After Visit Summary.  MyChart is used to connect with patients for Virtual Visits (Telemedicine).  Patients are able to view lab/test results, encounter notes, upcoming appointments, etc.  Non-urgent messages  can be sent to your provider as well.   To learn more about what you can do with MyChart, go to [email or web address].    Your next appointment:   6 month(s)  The format for your next appointment:   In Person  Provider:   Kate Sable, MD   Other Instructions     Signed, Kate Sable, MD  01/20/2020 1:11 PM    [place]

## 2020-01-20 NOTE — Patient Instructions (Signed)

## 2020-02-03 DIAGNOSIS — Z20822 Contact with and (suspected) exposure to covid-19: Secondary | ICD-10-CM | POA: Diagnosis not present

## 2020-02-04 ENCOUNTER — Ambulatory Visit (INDEPENDENT_AMBULATORY_CARE_PROVIDER_SITE_OTHER): Payer: Medicare Other | Admitting: Internal Medicine

## 2020-02-04 ENCOUNTER — Encounter: Payer: Self-pay | Admitting: Internal Medicine

## 2020-02-04 ENCOUNTER — Other Ambulatory Visit: Payer: Self-pay

## 2020-02-04 VITALS — BP 130/78 | HR 87 | Temp 98.1°F | Ht 62.75 in | Wt 147.0 lb

## 2020-02-04 DIAGNOSIS — G3184 Mild cognitive impairment, so stated: Secondary | ICD-10-CM | POA: Diagnosis not present

## 2020-02-04 DIAGNOSIS — R413 Other amnesia: Secondary | ICD-10-CM | POA: Diagnosis not present

## 2020-02-04 DIAGNOSIS — Z23 Encounter for immunization: Secondary | ICD-10-CM | POA: Diagnosis not present

## 2020-02-04 DIAGNOSIS — M1811 Unilateral primary osteoarthritis of first carpometacarpal joint, right hand: Secondary | ICD-10-CM

## 2020-02-04 DIAGNOSIS — M189 Osteoarthritis of first carpometacarpal joint, unspecified: Secondary | ICD-10-CM | POA: Insufficient documentation

## 2020-02-04 DIAGNOSIS — I2510 Atherosclerotic heart disease of native coronary artery without angina pectoris: Secondary | ICD-10-CM

## 2020-02-04 DIAGNOSIS — K219 Gastro-esophageal reflux disease without esophagitis: Secondary | ICD-10-CM

## 2020-02-04 DIAGNOSIS — F39 Unspecified mood [affective] disorder: Secondary | ICD-10-CM

## 2020-02-04 DIAGNOSIS — E039 Hypothyroidism, unspecified: Secondary | ICD-10-CM | POA: Diagnosis not present

## 2020-02-04 DIAGNOSIS — Z0000 Encounter for general adult medical examination without abnormal findings: Secondary | ICD-10-CM

## 2020-02-04 DIAGNOSIS — K580 Irritable bowel syndrome with diarrhea: Secondary | ICD-10-CM | POA: Diagnosis not present

## 2020-02-04 DIAGNOSIS — Z7189 Other specified counseling: Secondary | ICD-10-CM | POA: Diagnosis not present

## 2020-02-04 DIAGNOSIS — I700 Atherosclerosis of aorta: Secondary | ICD-10-CM

## 2020-02-04 LAB — COMPREHENSIVE METABOLIC PANEL
ALT: 14 U/L (ref 0–35)
AST: 15 U/L (ref 0–37)
Albumin: 4.3 g/dL (ref 3.5–5.2)
Alkaline Phosphatase: 66 U/L (ref 39–117)
BUN: 19 mg/dL (ref 6–23)
CO2: 29 mEq/L (ref 19–32)
Calcium: 9.7 mg/dL (ref 8.4–10.5)
Chloride: 105 mEq/L (ref 96–112)
Creatinine, Ser: 0.79 mg/dL (ref 0.40–1.20)
GFR: 68.29 mL/min (ref >60.00)
Glucose, Bld: 89 mg/dL (ref 70–99)
Potassium: 4.8 mEq/L (ref 3.5–5.1)
Sodium: 142 mEq/L (ref 135–145)
Total Bilirubin: 0.4 mg/dL (ref 0.2–1.2)
Total Protein: 7 g/dL (ref 6.0–8.3)

## 2020-02-04 LAB — LIPID PANEL
Cholesterol: 172 mg/dL (ref 0–200)
HDL: 59.6 mg/dL (ref >39.00)
LDL Cholesterol: 96 mg/dL (ref 0–99)
NonHDL: 112.77
Total CHOL/HDL Ratio: 3
Triglycerides: 83 mg/dL (ref 0.0–149.0)
VLDL: 16.6 mg/dL (ref 0.0–40.0)

## 2020-02-04 LAB — VITAMIN B12: Vitamin B-12: 183 pg/mL — ABNORMAL LOW (ref 211–911)

## 2020-02-04 LAB — CBC
HCT: 44.8 % (ref 36.0–46.0)
Hemoglobin: 14.9 g/dL (ref 12.0–15.0)
MCHC: 33.4 g/dL (ref 30.0–36.0)
MCV: 93.9 fl (ref 78.0–100.0)
Platelets: 218 10*3/uL (ref 150.0–400.0)
RBC: 4.77 Mil/uL (ref 3.87–5.11)
RDW: 13.2 % (ref 11.5–15.5)
WBC: 8 10*3/uL (ref 4.0–10.5)

## 2020-02-04 LAB — TSH: TSH: 3.62 u[IU]/mL (ref 0.35–4.50)

## 2020-02-04 LAB — T4, FREE: Free T4: 0.93 ng/dL (ref 0.60–1.60)

## 2020-02-04 NOTE — Progress Notes (Signed)
Subjective:    Patient ID: Ariel Phillips, female    DOB: 06/11/1932, 86 y.o.   MRN: [id number]  HPI Here for Medicare wellness visit and follow up of chronic health conditions This visit occurred during the SARS-CoV-2 public health emergency.  Safety protocols were in place, including screening questions prior to the visit, additional usage of staff PPE, and extensive cleaning of exam room while observing appropriate contact time as indicated for disinfecting solutions.   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[place] drives, does shopping, all instrumental ADLs  Still some mood issues Gets depressed with above physical problems---but not long lasting Mostly troubled when the IBS is bad No anxiety  Did find calcium in LAD No longer having chest pain Is on ASA now Known aortic calcification ----no statin per the cardiologist  Continues on daily levothyroxine Does take on empty stomach Energy levels are not great--but no change  Takes omeprazole daily Controls acid No dsyphagia  Pain at right [facility] Uses diclofenac gel prn Rare ibuprofen  Current Outpatient Medications on File Prior to Visit  Medication Sig Dispense Refill  . alum & mag hydroxide-simeth (MYLANTA MAXIMUM STRENGTH) 400-400-40 MG/5ML suspension Take 10 mLs by mouth as needed for indigestion.     Marland Kitchen aspirin EC 81 MG tablet Take 1 tablet (81 mg total) by mouth daily.    . carboxymethylcellulose (REFRESH PLUS) 0.5 % SOLN Place 2 drops  into both eyes daily as needed (dry eyes).     Marland Kitchen diclofenac Sodium (VOLTAREN) 1 % GEL Apply 2 g topically 4 (four) times daily as needed. 150 g 5  . ibuprofen (ADVIL) 200 MG tablet Take 200 mg by mouth daily as needed for headache or moderate pain.    Marland Kitchen levothyroxine (SYNTHROID) 75 MCG tablet TAKE 1 TABLET EVERY DAY ON EMPTY STOMACHWITH A GLASS OF WATER AT LEAST 30-60 MINBEFORE BREAKFAST 90 tablet 3  . loratadine (CLARITIN) 10 MG tablet Take 10 mg by mouth at bedtime.     Marland Kitchen omeprazole (PRILOSEC) 20 MG capsule TAKE 1 CAPSULE BY MOUTH AT BEDTIME 90 capsule 3  . rifaximin (XIFAXAN) 550 MG TABS tablet      No current facility-administered medications on file prior to visit.    Allergies  Allergen Reactions  . Penicillins Diarrhea    Did it involve swelling of the face/tongue/throat, SOB, or low BP? No Did it involve sudden or severe rash/hives, skin peeling, or any reaction on the inside of your mouth or nose? No Did you need to seek medical attention at a hospital or doctor's office? Yes When did it last happen?20+ years If all above answers are "NO", may proceed with cephalosporin use.   . Latex Rash    Past Medical History:  Diagnosis Date  . Allergic asthma   . Allergic rhinitis due to pollen   . Allergy   . GERD (gastroesophageal reflux disease)   . IBS (irritable bowel syndrome)   . Osteopenia   . Unspecified hypothyroidism     Past Surgical History:  Procedure Laterality Date  .  ANAL FISSURE REPAIR  1950's  . BASAL CELL CARCINOMA EXCISION  2000's   nose  . CHOLECYSTECTOMY  2008  . COLONOSCOPY  Multiple   Negative screening exams  . ENDOSCOPIC RETROGRADE CHOLANGIOPANCREATOGRAPHY (ERCP) WITH PROPOFOL N/A 02/13/2019   Procedure: ENDOSCOPIC RETROGRADE CHOLANGIOPANCREATOGRAPHY (ERCP) WITH PROPOFOL;  Surgeon: Gatha Mayer, MD;  [facility];  Service: Endoscopy;  Laterality: N/A;  . [place] or so   with fissure repair  . REMOVAL OF STONES   02/13/2019   Procedure: REMOVAL OF STONES;  Surgeon: Gatha Mayer, MD;  [facility]: [facility];  Service: Endoscopy;;  . [name]  02/13/2019   Procedure: [name];  Surgeon: Gatha Mayer, MD;  [facility];  Service: Endoscopy;;  . TUBAL LIGATION      Family History  Problem Relation Age of Onset  . COPD Mother   . Cancer Mother        unclear diagnosis  . Transient ischemic attack Mother   . Diabetes Sister   . Hypertension Sister   . Stroke Other   . Cancer Paternal Grandmother        ?breast  . Heart disease Neg Hx     Social History   Socioeconomic History  . Marital status: Divorced    Spouse name: Not on file  . Number of children: 1  . Years of education: Not on file  . Highest education level: Not on file  Occupational History  . Occupation: Retired historian--consultant  Tobacco Use  . Smoking status: Never Smoker  . Smokeless tobacco: Never Used  Vaping Use  . Vaping Use: Never used  Substance and Sexual Activity  . Alcohol use: Yes    Comment: rare wine  . Drug use: No  . Sexual activity: Not Currently  Other Topics Concern  . Not on file  Social History Narrative   Divorced and retired , moved from [place], [place]   1 Daughter in [place]   Has living will   Daughter has health care POA.---[name]   Requests DNR--order done 06/19/12   Would not want prolonged mechanical ventilation   May accept tube feeds temporarily but not if cognitively unaware   Social Determinants of Health   Financial Resource Strain:   . Difficulty of Paying Living Expenses: Not on file  Food Insecurity:   . Worried About Charity fundraiser in the Last Year: Not on file  . Ran Out of Food in the Last Year: Not on file  Transportation Needs:   . Lack of Transportation (Medical): Not on file  . Lack of Transportation (Non-Medical): Not on file  Physical Activity:   . Days of Exercise per Week: Not on file  . Minutes of  Exercise per Session: Not on file  Stress:   . Feeling of Stress : Not on file  Social Connections:   . Frequency of Communication with Friends and Family: Not on file  . Frequency of Social Gatherings with Friends and Family: Not on file  . Attends Religious Services: Not on file  . Active Member of Clubs or Organizations: Not on file  . Attends Archivist Meetings: Not on file  . Marital Status: Not on file  Intimate Partner Violence:   . Fear of Current or Ex-Partner: Not on file  . Emotionally Abused: Not on file  . Physically Abused: Not on file  . Sexually Abused: Not on file   Review of Systems Appetite is good Weight  up some with COVID--now getting it off again Sleeps well Wears seat belt Teeth are okay--keeps up with dentist No suspicious skin lesions. Mild eczema on leg is gone No dysuria or hematuria. Some urgency and incontinence (wears diaper for the diarrhea) No other joint pains    Objective:   Physical Exam Constitutional:      Appearance: Normal appearance.  HENT:     Mouth/Throat:     Comments: No lesions Eyes:     Conjunctiva/sclera: Conjunctivae normal.     Comments: Right pupil is normal Atrophy of right eye  Cardiovascular:     Rate and Rhythm: Normal rate and regular rhythm.     Pulses: Normal pulses.     Heart sounds: No murmur heard.  No gallop.   Abdominal:     Palpations: Abdomen is soft.     Tenderness: There is no abdominal tenderness.  Musculoskeletal:     Cervical back: Neck supple.     Right lower leg: No edema.     Left lower leg: No edema.  Lymphadenopathy:     Cervical: No cervical adenopathy.  Skin:    General: Skin is warm.     Findings: No rash.  Neurological:     Mental Status: She is alert and oriented to person, place, and time.     Comments: President--- "Jonna Munro, Daisy Floro, Barack Obama" [phone] [id number] Recall 3/3  Psychiatric:        Mood and Affect: Mood normal.        Behavior:  Behavior normal.            Assessment & Plan:

## 2020-02-04 NOTE — Assessment & Plan Note (Signed)
Seems to be euthyroid on levothyroxine Will check labs

## 2020-02-04 NOTE — Assessment & Plan Note (Signed)
Does okay with daily omeprazole 

## 2020-02-04 NOTE — Assessment & Plan Note (Signed)
On CT scan Discussed statin--but will hold off

## 2020-02-04 NOTE — Assessment & Plan Note (Signed)
I have personally reviewed the Medicare Annual Wellness questionnaire and have noted 1. The patient's medical and social history 2. Their use of alcohol, tobacco or illicit drugs 3. Their current medications and supplements 4. The patient's functional ability including ADL's, fall risks, home safety risks and hearing or visual             impairment. 5. Diet and physical activities 6. Evidence for depression or mood disorders  The patients weight, height, BMI and visual acuity have been recorded in the chart I have made referrals, counseling and provided education to the patient based review of the above and I have provided the pt with a written personalized care plan for preventive services.  I have provided you with a copy of your personalized plan for preventive services. Please take the time to review along with your updated medication list.  No cancer screening due to age Flu vaccine today Recommended COVID booster Discussed exercise

## 2020-02-04 NOTE — Assessment & Plan Note (Signed)
Really life altering xifaxin is a miracle drug but can only take it for 2 weeks She will review this with Dr Carlean Purl

## 2020-02-04 NOTE — Assessment & Plan Note (Signed)
Chronic stable memory issues No functional problems

## 2020-02-04 NOTE — Assessment & Plan Note (Signed)
The diclofenac gel has worked really well for her

## 2020-02-04 NOTE — Assessment & Plan Note (Signed)
See social history Has DNR

## 2020-02-04 NOTE — Assessment & Plan Note (Signed)
No more chest pain Is on aspirin

## 2020-02-04 NOTE — Assessment & Plan Note (Signed)
Ongoing dysthymia---largely related to COVID restrictions, etc and limitations with her chronic diarrhea Not MDD Will hold off on medications

## 2020-02-05 ENCOUNTER — Other Ambulatory Visit: Payer: Self-pay | Admitting: Internal Medicine

## 2020-02-05 DIAGNOSIS — E538 Deficiency of other specified B group vitamins: Secondary | ICD-10-CM

## 2020-03-04 ENCOUNTER — Other Ambulatory Visit: Payer: Self-pay

## 2020-03-04 ENCOUNTER — Encounter: Payer: Self-pay | Admitting: Internal Medicine

## 2020-03-04 ENCOUNTER — Ambulatory Visit (INDEPENDENT_AMBULATORY_CARE_PROVIDER_SITE_OTHER): Payer: Medicare Other | Admitting: Internal Medicine

## 2020-03-04 ENCOUNTER — Telehealth: Payer: Self-pay

## 2020-03-04 VITALS — BP 134/78 | HR 65 | Temp 97.4°F | Wt 151.1 lb

## 2020-03-04 DIAGNOSIS — S60041A Contusion of right ring finger without damage to nail, initial encounter: Secondary | ICD-10-CM | POA: Insufficient documentation

## 2020-03-04 DIAGNOSIS — I2510 Atherosclerotic heart disease of native coronary artery without angina pectoris: Secondary | ICD-10-CM

## 2020-03-04 NOTE — Telephone Encounter (Signed)
Pt has already seen Dr Silvio Pate in office on 03/04/20.

## 2020-03-04 NOTE — Telephone Encounter (Signed)
See my OV note Probably just a contusion

## 2020-03-04 NOTE — Assessment & Plan Note (Signed)
No signs of fracture--no tenderness and full ROM Discussed trying some ice to limit any bleeding under the surface No meds needed

## 2020-03-04 NOTE — Progress Notes (Signed)
Subjective:    Patient ID: Ariel Phillips, female    DOB: [birth year], 86 y.o.   MRN: [id number]  HPI Here due to finger pain This visit occurred during the SARS-CoV-2 public health emergency.  Safety protocols were in place, including screening questions prior to the visit, additional usage of staff PPE, and extensive cleaning of exam room while observing appropriate contact time as indicated for disinfecting solutions.   Fell yesterday (on her birthday) Caught toe putting cat box up [address] forward and jammed right 4th finger--and hit head (but no fall to the ground) Thought she was fine Tried some advil Continued with regular activity---just slight aching in finger Sat watching baseball game last night--then noticed swelling and redness Not particularly painful Able to bend okay  Current Outpatient Medications on File Prior to Visit  Medication Sig Dispense Refill  . alum & mag hydroxide-simeth (MYLANTA MAXIMUM STRENGTH) 400-400-40 MG/5ML suspension Take 10 mLs by mouth as needed for indigestion.     Marland Kitchen aspirin EC 81 MG tablet Take 1 tablet (81 mg total) by mouth daily.    . carboxymethylcellulose (REFRESH PLUS) 0.5 % SOLN Place 2 drops into both eyes daily as needed (dry eyes).     Marland Kitchen diclofenac Sodium (VOLTAREN) 1 % GEL Apply 2 g topically 4 (four) times daily as needed. 150 g 5  . ibuprofen (ADVIL) 200 MG tablet Take 200 mg by mouth daily as needed for headache or moderate pain.    Marland Kitchen levothyroxine (SYNTHROID) 75 MCG tablet TAKE 1 TABLET EVERY DAY ON EMPTY STOMACHWITH A GLASS OF WATER AT LEAST 30-60 MINBEFORE BREAKFAST 90 tablet 3  . loratadine (CLARITIN) 10 MG tablet Take 10 mg by mouth at bedtime.     Marland Kitchen omeprazole (PRILOSEC) 20 MG capsule TAKE 1 CAPSULE BY MOUTH AT BEDTIME 90 capsule 3  . rifaximin (XIFAXAN) 550 MG TABS tablet      No current facility-administered medications on file prior to visit.    Allergies  Allergen Reactions  . Penicillins Diarrhea    Did it involve  swelling of the face/tongue/throat, SOB, or low BP? No Did it involve sudden or severe rash/hives, skin peeling, or any reaction on the inside of your mouth or nose? No Did you need to seek medical attention at a hospital or doctor's office? Yes When did it last happen?20+ years If all above answers are "NO", may proceed with cephalosporin use.   . Latex Rash    Past Medical History:  Diagnosis Date  . Allergic asthma   . Allergic rhinitis due to pollen   . Allergy   . GERD (gastroesophageal reflux disease)   . IBS (irritable bowel syndrome)   . Osteopenia   . Unspecified hypothyroidism     Past Surgical History:  Procedure Laterality Date  . ANAL FISSURE REPAIR  1950's  . BASAL CELL CARCINOMA EXCISION  2000's   nose  . CHOLECYSTECTOMY  2008  . COLONOSCOPY  Multiple   Negative screening exams  . ENDOSCOPIC RETROGRADE CHOLANGIOPANCREATOGRAPHY (ERCP) WITH PROPOFOL N/A 02/13/2019   Procedure: ENDOSCOPIC RETROGRADE CHOLANGIOPANCREATOGRAPHY (ERCP) WITH PROPOFOL;  Surgeon: Gatha Mayer, MD;  Location: [facility];  Service: Endoscopy;  Laterality: N/A;  . [place] or so   with fissure repair  . REMOVAL OF STONES  02/13/2019   Procedure: REMOVAL OF STONES;  Surgeon: Gatha Mayer, MD;  Location: [facility];  Service: Endoscopy;;  . SPHINCTEROTOMY  02/13/2019   Procedure: [name];  Surgeon: Gatha Mayer,  MD;  Location: [facility];  Service: Endoscopy;;  . TUBAL LIGATION      Family History  Problem Relation Age of Onset  . COPD Mother   . Cancer Mother        unclear diagnosis  . Transient ischemic attack Mother   . Diabetes Sister   . Hypertension Sister   . Stroke Other   . Cancer Paternal Grandmother        ?breast  . Heart disease Neg Hx     Social History   Socioeconomic History  . Marital status: Divorced    Spouse name: Not on file  . Number of children: 1  . Years of education: Not on file  . Highest education  level: Not on file  Occupational History  . Occupation: Retired historian--consultant  Tobacco Use  . Smoking status: Never Smoker  . Smokeless tobacco: Never Used  Vaping Use  . Vaping Use: Never used  Substance and Sexual Activity  . Alcohol use: Yes    Comment: rare wine  . Drug use: No  . Sexual activity: Not Currently  Other Topics Concern  . Not on file  Social History Narrative   Divorced and retired , moved from [place], [place]   1 Daughter in [place]   Has living will   Daughter has health care POA.---[name]   Requests DNR--order done 06/19/12   Would not want prolonged mechanical ventilation   May accept tube feeds temporarily but not if cognitively unaware   Social Determinants of Health   Financial Resource Strain:   . Difficulty of Paying Living Expenses: Not on file  Food Insecurity:   . Worried About Charity fundraiser in the Last Year: Not on file  . Ran Out of Food in the Last Year: Not on file  Transportation Needs:   . Lack of Transportation (Medical): Not on file  . Lack of Transportation (Non-Medical): Not on file  Physical Activity:   . Days of Exercise per Week: Not on file  . Minutes of Exercise per Session: Not on file  Stress:   . Feeling of Stress : Not on file  Social Connections:   . Frequency of Communication with Friends and Family: Not on file  . Frequency of Social Gatherings with Friends and Family: Not on file  . Attends Religious Services: Not on file  . Active Member of Clubs or Organizations: Not on file  . Attends Archivist Meetings: Not on file  . Marital Status: Not on file  Intimate Partner Violence:   . Fear of Current or Ex-Partner: Not on file  . Emotionally Abused: Not on file  . Physically Abused: Not on file  . Sexually Abused: Not on file   Review of Systems No skin breaks No fever    Objective:   Physical Exam Musculoskeletal:     Comments: Swelling in right 4th PIP (on top--not  in the joint) Redness in proximal phalanx but not warm or tender Full ROM and normal grip strength            Assessment & Plan:

## 2020-03-04 NOTE — Telephone Encounter (Signed)
[facility] - Client TELEPHONE ADVICE RECORD [facility] Patient Name: Ariel Phillips Gender: Female DOB: May 18, 1932 Age: 86 Y 1 D Return Phone Number: [phone] (Primary) Address: City/State/Zip: [place] [address] Client [facility] - Client Client Site [place] - Day Physician [name]- MD Contact Type Call Who Is Calling Patient / Member / Family / Caregiver Call Type Triage / Clinical Relationship To Patient Self Return Phone Number [phone] (Primary) Chief Complaint Finger Injury Reason for Call Symptomatic / Request for [place] [facility] [place] at [facility], with a patient that needs triage. Lost her balance yesterday, jammed her ring finger into the door wall. It did not hurt. Went to bed last night, bottom part of her finger was red, and this morning it seems to be moving. It is still very red and spreading. Additional Comment has an appointment at 9:30 Translation No Disp. Time Eilene Ghazi Time) Disposition Final User 03/04/2020 9:06:02 AM Attempt made - message left Raphael Gibney, RN, Vanita Ingles 03/04/2020 9:20:53 AM Attempt made - message left Raphael Gibney, RN, Vanita Ingles 03/04/2020 9:41:27 AM FINAL ATTEMPT MADE - message left

## 2020-03-13 DIAGNOSIS — Z23 Encounter for immunization: Secondary | ICD-10-CM | POA: Diagnosis not present

## 2020-03-17 ENCOUNTER — Other Ambulatory Visit (INDEPENDENT_AMBULATORY_CARE_PROVIDER_SITE_OTHER): Payer: Medicare Other

## 2020-03-17 ENCOUNTER — Other Ambulatory Visit: Payer: Self-pay

## 2020-03-17 DIAGNOSIS — E538 Deficiency of other specified B group vitamins: Secondary | ICD-10-CM

## 2020-03-17 LAB — VITAMIN B12: Vitamin B-12: 676 pg/mL (ref 211–911)

## 2020-03-18 ENCOUNTER — Other Ambulatory Visit: Payer: Medicare Other

## 2020-03-20 IMAGING — CR CHEST - 2 VIEW
1 series · 2 of 2 positions shown · non-contrast
Comparison: 10/10/2017

CLINICAL DATA: Pt states chest pain since this morning, worse when
taking deep breath. Hx of asthma. No hx of pneumothorax surgery. Non
smoker.

EXAM:
CHEST - 2 VIEW

[Series 1: dg chest 2 view · 0.14mm/px · 2 of 2 slices shown]
[im 1/2]
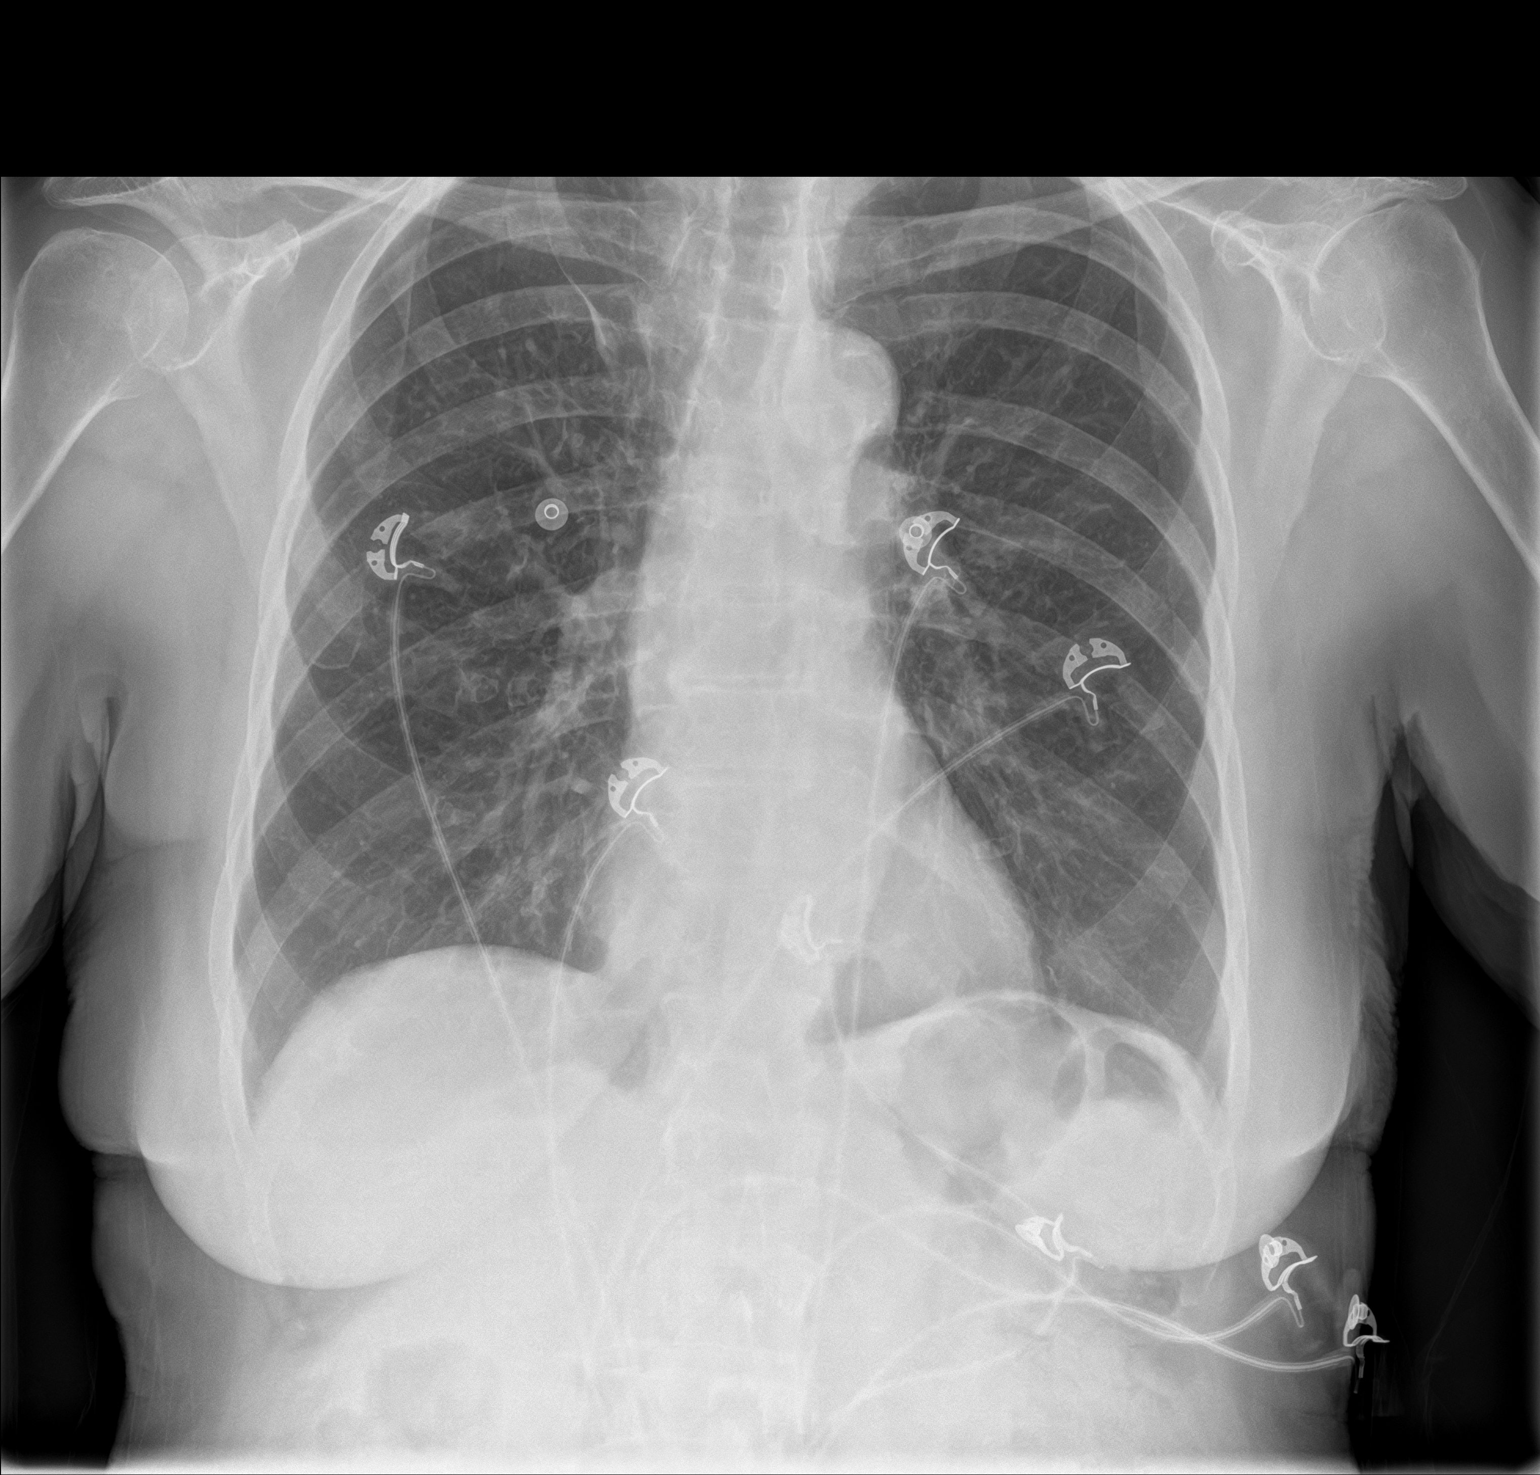
[im 2/2]
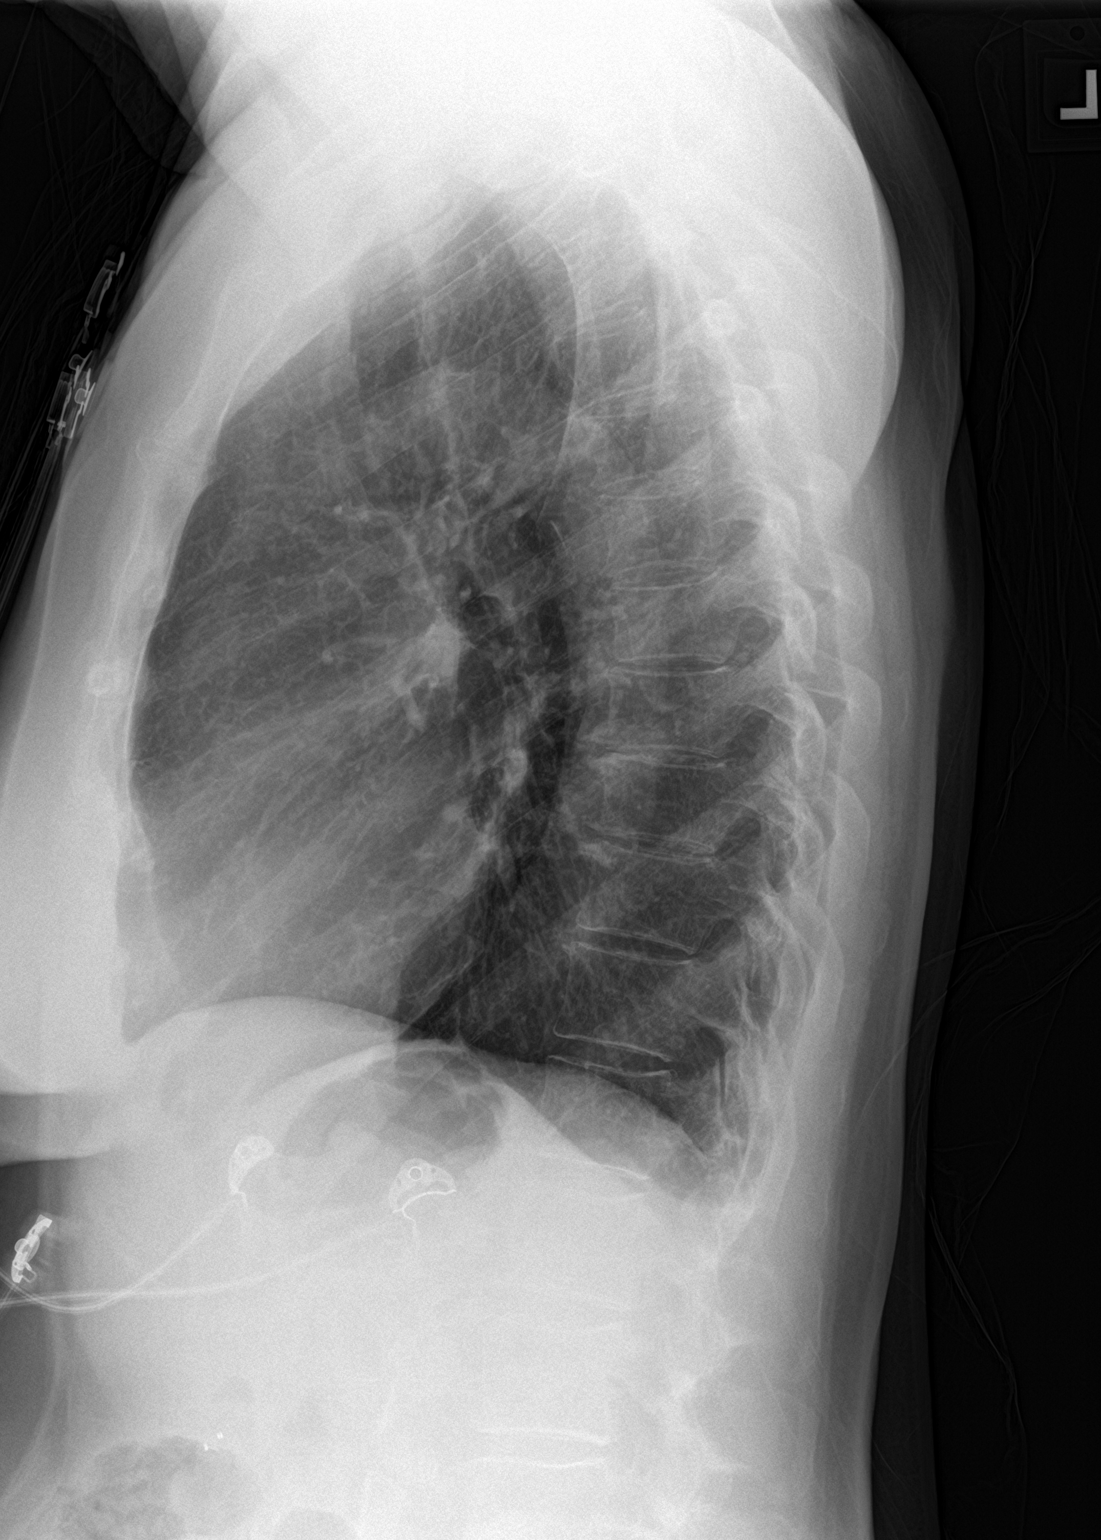

[2 of 2 positions shown; findings below may reference images not displayed]

FINDINGS: Cardiomediastinal silhouette is normal. There is mild
atherosclerotic calcification of the thoracic aorta. No acute
consolidation or pulmonary edema.
IMPRESSION: No active cardiopulmonary disease.

Aortic atherosclerosis.  ([id number])

## 2020-04-17 DIAGNOSIS — D225 Melanocytic nevi of trunk: Secondary | ICD-10-CM | POA: Diagnosis not present

## 2020-04-17 DIAGNOSIS — D2262 Melanocytic nevi of left upper limb, including shoulder: Secondary | ICD-10-CM | POA: Diagnosis not present

## 2020-04-17 DIAGNOSIS — Z85828 Personal history of other malignant neoplasm of skin: Secondary | ICD-10-CM | POA: Diagnosis not present

## 2020-04-17 DIAGNOSIS — D2261 Melanocytic nevi of right upper limb, including shoulder: Secondary | ICD-10-CM | POA: Diagnosis not present

## 2020-04-17 DIAGNOSIS — D2271 Melanocytic nevi of right lower limb, including hip: Secondary | ICD-10-CM | POA: Diagnosis not present

## 2020-04-17 DIAGNOSIS — D2272 Melanocytic nevi of left lower limb, including hip: Secondary | ICD-10-CM | POA: Diagnosis not present

## 2020-04-22 DIAGNOSIS — Z20822 Contact with and (suspected) exposure to covid-19: Secondary | ICD-10-CM | POA: Diagnosis not present

## 2020-05-16 IMAGING — US US ABDOMEN COMPLETE
1 series · 13 of 25 positions shown · non-contrast
Comparison: None.

CLINICAL DATA: Epigastric abdominal pain. History of
cholecystectomy.

EXAM:
ABDOMEN ULTRASOUND COMPLETE

[Series 1: us abdomen complete · 0.22mm/px · 13 of 86 slices shown]
[im 1/86]
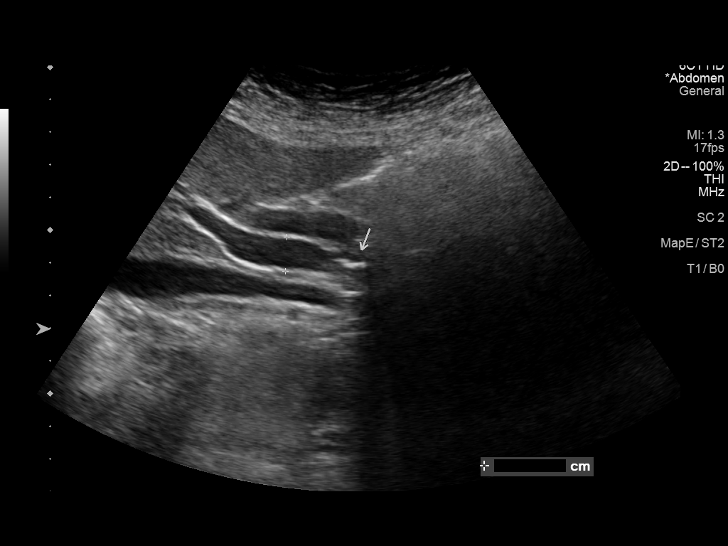
[im 8/86]
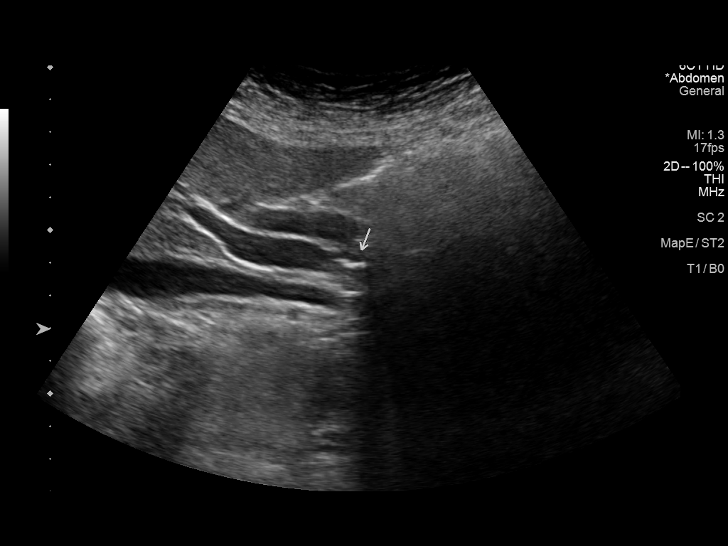
[im 15/86]
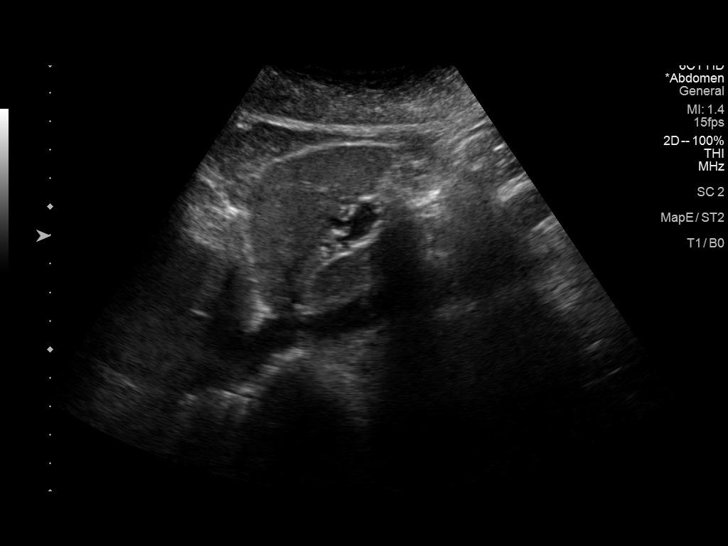
[im 22/86]
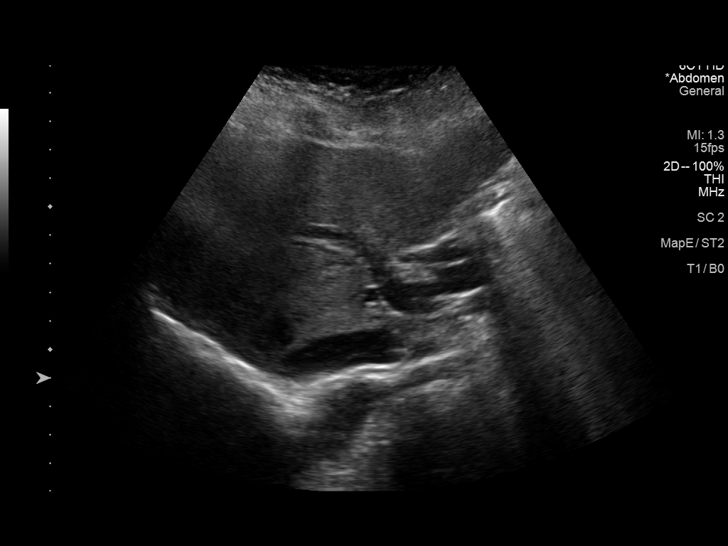
[im 29/86]
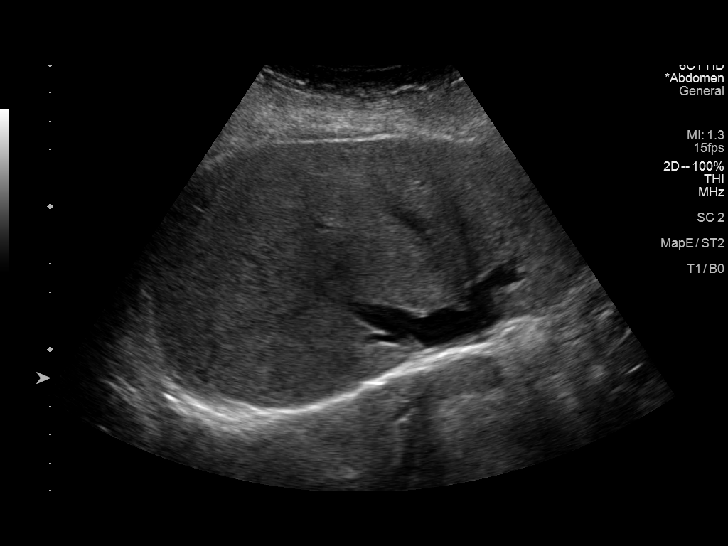
[im 36/86]
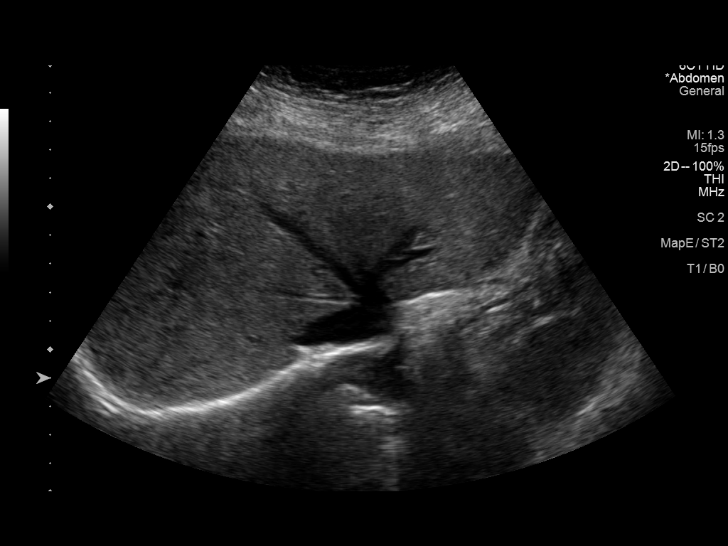
[im 43/86]
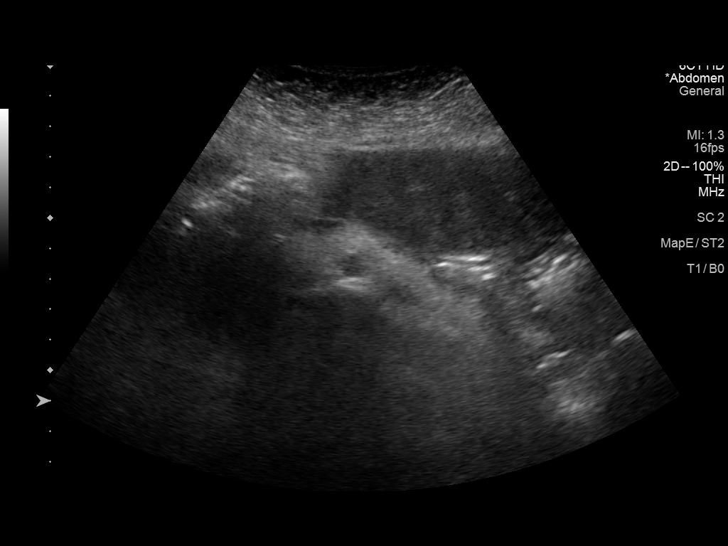
[im 50/86]
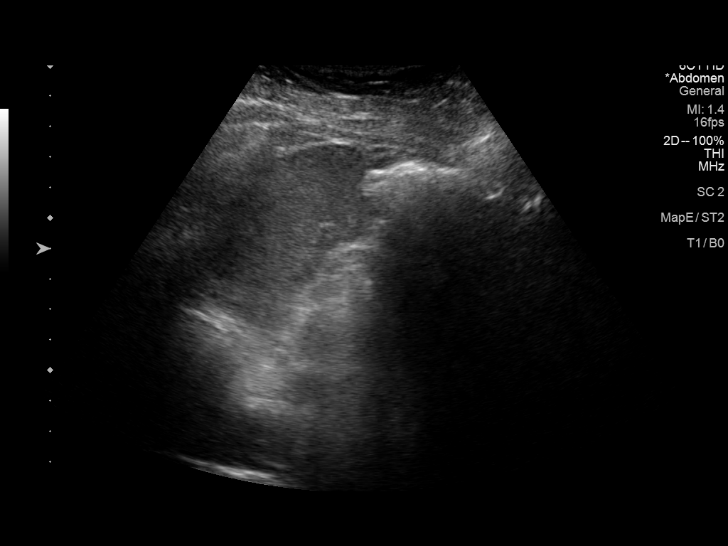
[im 57/86]
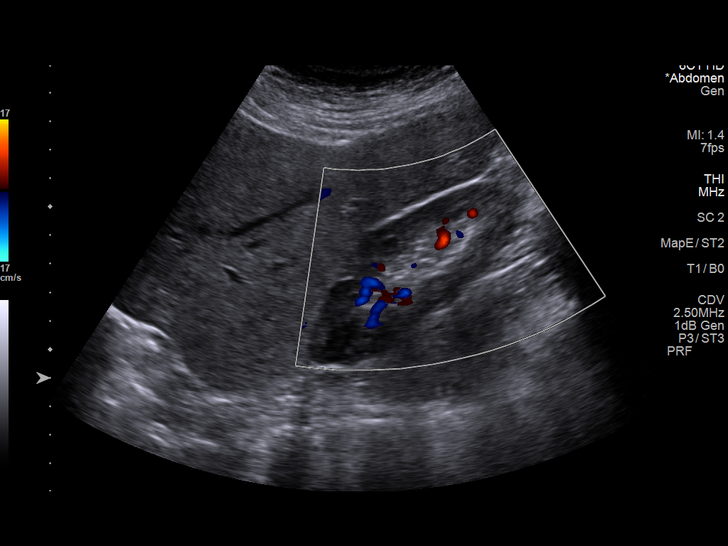
[im 64/86]
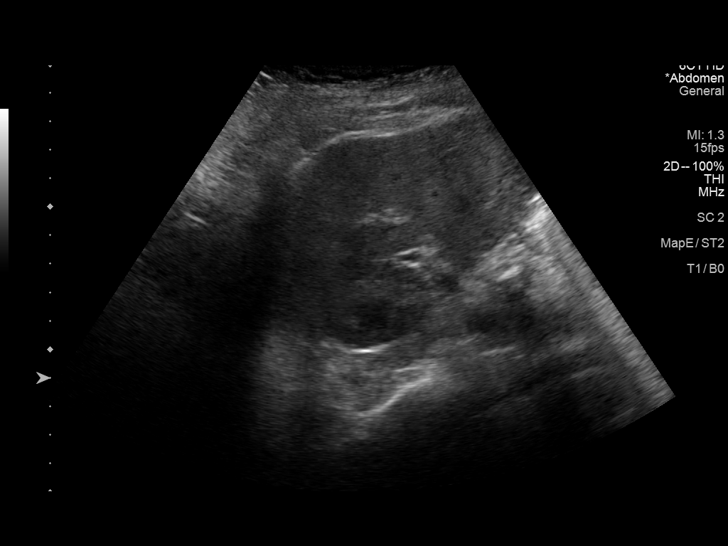
[im 71/86]
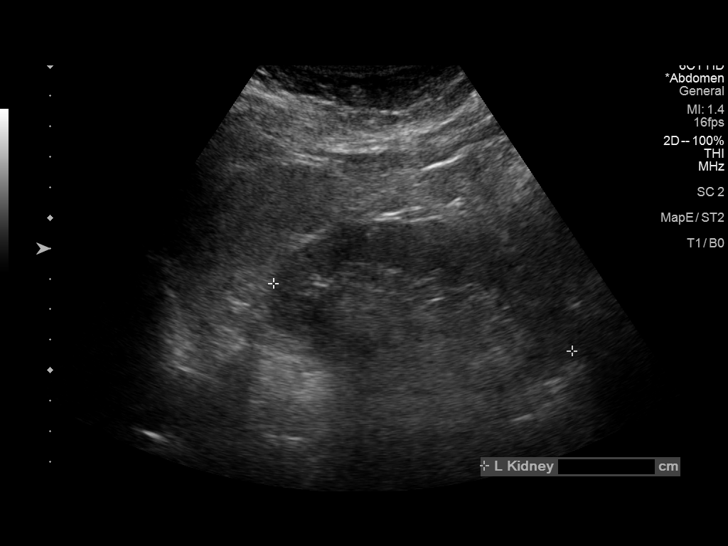
[im 78/86]
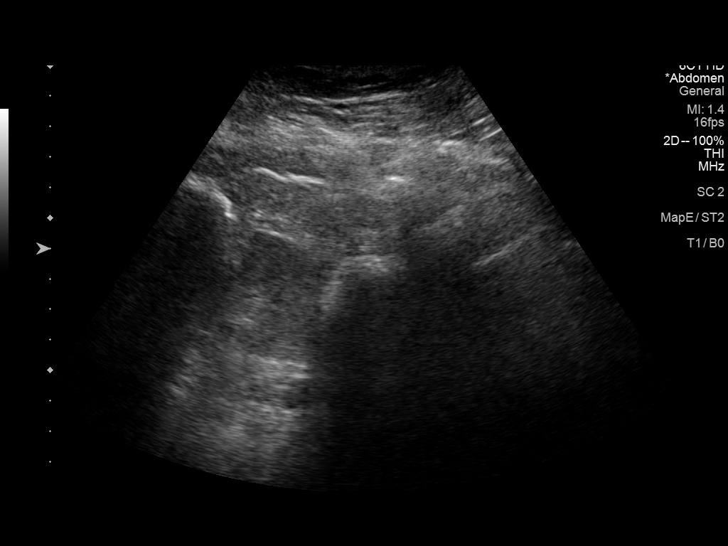
[im 86/86]
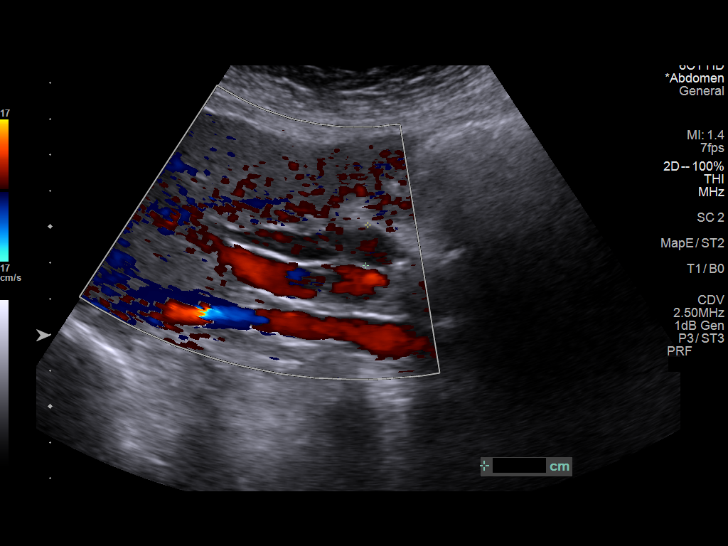

[13 of 25 positions shown; findings below may reference images not displayed]

FINDINGS: Gallbladder: Removed.

Common bile duct: Diameter: 11 mm. 0.8 cm echogenic focus is
identified in the distal common bile duct.

Liver: There is a hypoechoic lesion in the left hepatic lobe
measuring 3.8 x 2.3 cm as seen on image 26. Mild intrahepatic
biliary ductal dilatation is seen. Portal vein is patent on color
Doppler imaging with normal direction of blood flow towards the
liver.

IVC: No abnormality visualized.

Pancreas: Visualized portion unremarkable.

Spleen: Size and appearance within normal limits.

Right Kidney: Length: 8.7 cm. Echogenicity within normal limits. No
mass or hydronephrosis visualized.

Left Kidney: Length: 10 cm. Echogenicity within normal limits. No
mass or hydronephrosis visualized.

Abdominal aorta: No aneurysm visualized.

Other findings: None.
IMPRESSION: 0.8 cm echogenic focus in the distal common bile duct with mild
intra and extrahepatic biliary ductal dilatation worrisome for
common bile duct stone. ERCP or MRCP recommended for further
evaluation.

Round hypoechoic lesion left hepatic lobe is nonspecific. The lesion
could be further evaluated at MRCP.

## 2020-06-15 DIAGNOSIS — H6123 Impacted cerumen, bilateral: Secondary | ICD-10-CM | POA: Diagnosis not present

## 2020-06-15 DIAGNOSIS — H903 Sensorineural hearing loss, bilateral: Secondary | ICD-10-CM | POA: Diagnosis not present

## 2020-06-26 ENCOUNTER — Emergency Department: Payer: Medicare Other

## 2020-06-26 ENCOUNTER — Other Ambulatory Visit: Payer: Self-pay

## 2020-06-26 ENCOUNTER — Inpatient Hospital Stay
Admission: EM | Admit: 2020-06-26 | Discharge: 2020-06-30 | DRG: 494 | Disposition: A | Discharge status: Skilled Nursing Facility | Payer: Medicare Other | Attending: Family Medicine | Admitting: Family Medicine

## 2020-06-26 DIAGNOSIS — R278 Other lack of coordination: Secondary | ICD-10-CM | POA: Diagnosis not present

## 2020-06-26 DIAGNOSIS — Z01818 Encounter for other preprocedural examination: Secondary | ICD-10-CM | POA: Diagnosis not present

## 2020-06-26 DIAGNOSIS — J301 Allergic rhinitis due to pollen: Secondary | ICD-10-CM | POA: Diagnosis present

## 2020-06-26 DIAGNOSIS — I2510 Atherosclerotic heart disease of native coronary artery without angina pectoris: Secondary | ICD-10-CM | POA: Diagnosis present

## 2020-06-26 DIAGNOSIS — S82851D Displaced trimalleolar fracture of right lower leg, subsequent encounter for closed fracture with routine healing: Secondary | ICD-10-CM | POA: Diagnosis not present

## 2020-06-26 DIAGNOSIS — Z7982 Long term (current) use of aspirin: Secondary | ICD-10-CM | POA: Diagnosis not present

## 2020-06-26 DIAGNOSIS — Z79890 Hormone replacement therapy (postmenopausal): Secondary | ICD-10-CM | POA: Diagnosis not present

## 2020-06-26 DIAGNOSIS — M25571 Pain in right ankle and joints of right foot: Secondary | ICD-10-CM | POA: Diagnosis not present

## 2020-06-26 DIAGNOSIS — M8580 Other specified disorders of bone density and structure, unspecified site: Secondary | ICD-10-CM | POA: Diagnosis not present

## 2020-06-26 DIAGNOSIS — Z20822 Contact with and (suspected) exposure to covid-19: Secondary | ICD-10-CM | POA: Diagnosis present

## 2020-06-26 DIAGNOSIS — S82841A Displaced bimalleolar fracture of right lower leg, initial encounter for closed fracture: Secondary | ICD-10-CM | POA: Diagnosis not present

## 2020-06-26 DIAGNOSIS — M1810 Unilateral primary osteoarthritis of first carpometacarpal joint, unspecified hand: Secondary | ICD-10-CM | POA: Diagnosis present

## 2020-06-26 DIAGNOSIS — Z09 Encounter for follow-up examination after completed treatment for conditions other than malignant neoplasm: Secondary | ICD-10-CM

## 2020-06-26 DIAGNOSIS — I700 Atherosclerosis of aorta: Secondary | ICD-10-CM | POA: Diagnosis present

## 2020-06-26 DIAGNOSIS — K589 Irritable bowel syndrome without diarrhea: Secondary | ICD-10-CM | POA: Diagnosis present

## 2020-06-26 DIAGNOSIS — Z880 Allergy status to penicillin: Secondary | ICD-10-CM

## 2020-06-26 DIAGNOSIS — G3184 Mild cognitive impairment, so stated: Secondary | ICD-10-CM | POA: Diagnosis present

## 2020-06-26 DIAGNOSIS — Z79899 Other long term (current) drug therapy: Secondary | ICD-10-CM

## 2020-06-26 DIAGNOSIS — Q6671 Congenital pes cavus, right foot: Secondary | ICD-10-CM

## 2020-06-26 DIAGNOSIS — S8261XA Displaced fracture of lateral malleolus of right fibula, initial encounter for closed fracture: Secondary | ICD-10-CM | POA: Diagnosis not present

## 2020-06-26 DIAGNOSIS — Z741 Need for assistance with personal care: Secondary | ICD-10-CM | POA: Diagnosis not present

## 2020-06-26 DIAGNOSIS — S82891A Other fracture of right lower leg, initial encounter for closed fracture: Secondary | ICD-10-CM | POA: Diagnosis not present

## 2020-06-26 DIAGNOSIS — Z66 Do not resuscitate: Secondary | ICD-10-CM | POA: Diagnosis present

## 2020-06-26 DIAGNOSIS — K219 Gastro-esophageal reflux disease without esophagitis: Secondary | ICD-10-CM | POA: Diagnosis not present

## 2020-06-26 DIAGNOSIS — S82851A Displaced trimalleolar fracture of right lower leg, initial encounter for closed fracture: Principal | ICD-10-CM | POA: Diagnosis present

## 2020-06-26 DIAGNOSIS — S82891D Other fracture of right lower leg, subsequent encounter for closed fracture with routine healing: Secondary | ICD-10-CM | POA: Diagnosis not present

## 2020-06-26 DIAGNOSIS — R5381 Other malaise: Secondary | ICD-10-CM | POA: Diagnosis not present

## 2020-06-26 DIAGNOSIS — Z23 Encounter for immunization: Secondary | ICD-10-CM

## 2020-06-26 DIAGNOSIS — R4189 Other symptoms and signs involving cognitive functions and awareness: Secondary | ICD-10-CM | POA: Diagnosis not present

## 2020-06-26 DIAGNOSIS — M25572 Pain in left ankle and joints of left foot: Secondary | ICD-10-CM | POA: Diagnosis not present

## 2020-06-26 DIAGNOSIS — M6281 Muscle weakness (generalized): Secondary | ICD-10-CM | POA: Diagnosis not present

## 2020-06-26 DIAGNOSIS — Q6672 Congenital pes cavus, left foot: Secondary | ICD-10-CM | POA: Diagnosis not present

## 2020-06-26 DIAGNOSIS — Z4889 Encounter for other specified surgical aftercare: Secondary | ICD-10-CM | POA: Diagnosis not present

## 2020-06-26 DIAGNOSIS — T148XXA Other injury of unspecified body region, initial encounter: Secondary | ICD-10-CM

## 2020-06-26 DIAGNOSIS — R262 Difficulty in walking, not elsewhere classified: Secondary | ICD-10-CM | POA: Diagnosis not present

## 2020-06-26 DIAGNOSIS — R52 Pain, unspecified: Secondary | ICD-10-CM

## 2020-06-26 DIAGNOSIS — W19XXXA Unspecified fall, initial encounter: Secondary | ICD-10-CM | POA: Diagnosis not present

## 2020-06-26 DIAGNOSIS — Z9049 Acquired absence of other specified parts of digestive tract: Secondary | ICD-10-CM

## 2020-06-26 DIAGNOSIS — S82899A Other fracture of unspecified lower leg, initial encounter for closed fracture: Secondary | ICD-10-CM | POA: Diagnosis present

## 2020-06-26 DIAGNOSIS — M25562 Pain in left knee: Secondary | ICD-10-CM | POA: Diagnosis not present

## 2020-06-26 DIAGNOSIS — F39 Unspecified mood [affective] disorder: Secondary | ICD-10-CM | POA: Diagnosis not present

## 2020-06-26 DIAGNOSIS — I10 Essential (primary) hypertension: Secondary | ICD-10-CM | POA: Diagnosis not present

## 2020-06-26 DIAGNOSIS — K580 Irritable bowel syndrome with diarrhea: Secondary | ICD-10-CM

## 2020-06-26 DIAGNOSIS — E039 Hypothyroidism, unspecified: Secondary | ICD-10-CM | POA: Diagnosis not present

## 2020-06-26 DIAGNOSIS — R279 Unspecified lack of coordination: Secondary | ICD-10-CM | POA: Diagnosis not present

## 2020-06-26 DIAGNOSIS — W010XXA Fall on same level from slipping, tripping and stumbling without subsequent striking against object, initial encounter: Secondary | ICD-10-CM | POA: Diagnosis present

## 2020-06-26 DIAGNOSIS — S93491A Sprain of other ligament of right ankle, initial encounter: Secondary | ICD-10-CM | POA: Diagnosis present

## 2020-06-26 DIAGNOSIS — J302 Other seasonal allergic rhinitis: Secondary | ICD-10-CM | POA: Diagnosis present

## 2020-06-26 DIAGNOSIS — W1839XD Other fall on same level, subsequent encounter: Secondary | ICD-10-CM | POA: Diagnosis not present

## 2020-06-26 DIAGNOSIS — M542 Cervicalgia: Secondary | ICD-10-CM | POA: Diagnosis not present

## 2020-06-26 DIAGNOSIS — Z91040 Latex allergy status: Secondary | ICD-10-CM

## 2020-06-26 DIAGNOSIS — Z85828 Personal history of other malignant neoplasm of skin: Secondary | ICD-10-CM | POA: Diagnosis not present

## 2020-06-26 LAB — COMPREHENSIVE METABOLIC PANEL
ALT: 14 U/L (ref 0–44)
AST: 22 U/L (ref 15–41)
Albumin: 3.9 g/dL (ref 3.5–5.0)
Alkaline Phosphatase: 81 U/L (ref 38–126)
Anion gap: 6 (ref 5–15)
BUN: 21 mg/dL (ref 8–23)
CO2: 25 mmol/L (ref 22–32)
Calcium: 9.1 mg/dL (ref 8.9–10.3)
Chloride: 106 mmol/L (ref 98–111)
Creatinine, Ser: 0.6 mg/dL (ref 0.44–1.00)
GFR, Estimated: >60 mL/min (ref >60)
Glucose, Bld: 102 mg/dL — ABNORMAL HIGH (ref 70–99)
Potassium: 4.5 mmol/L (ref 3.5–5.1)
Sodium: 137 mmol/L (ref 135–145)
Total Bilirubin: 0.7 mg/dL (ref 0.3–1.2)
Total Protein: 7.3 g/dL (ref 6.5–8.1)

## 2020-06-26 LAB — CBC WITH DIFFERENTIAL/PLATELET
Abs Immature Granulocytes: 0.01 10*3/uL (ref 0.00–0.07)
Basophils Absolute: 0 10*3/uL (ref 0.0–0.1)
Basophils Relative: 1 %
Eosinophils Absolute: 0.3 10*3/uL (ref 0.0–0.5)
Eosinophils Relative: 5 %
HCT: 42.7 % (ref 36.0–46.0)
Hemoglobin: 14.1 g/dL (ref 12.0–15.0)
Immature Granulocytes: 0 %
Lymphocytes Relative: 22 %
Lymphs Abs: 1.3 10*3/uL (ref 0.7–4.0)
MCH: 31.1 pg (ref 26.0–34.0)
MCHC: 33 g/dL (ref 30.0–36.0)
MCV: 94.3 fL (ref 80.0–100.0)
Monocytes Absolute: 0.6 10*3/uL (ref 0.1–1.0)
Monocytes Relative: 10 %
Neutro Abs: 3.5 10*3/uL (ref 1.7–7.7)
Neutrophils Relative %: 62 %
Platelets: 241 10*3/uL (ref 150–400)
RBC: 4.53 MIL/uL (ref 3.87–5.11)
RDW: 12.8 % (ref 11.5–15.5)
WBC: 5.6 10*3/uL (ref 4.0–10.5)
nRBC: 0 % (ref 0.0–0.2)

## 2020-06-26 MED ORDER — TETANUS-DIPHTH-ACELL PERTUSSIS 5-2.5-18.5 LF-MCG/0.5 IM SUSY
0.5000 mL | PREFILLED_SYRINGE | Freq: Once | INTRAMUSCULAR | Status: AC
  Administered 2020-06-26: 0.5 mL via INTRAMUSCULAR
  Filled 2020-06-26: qty 0.5

## 2020-06-26 MED ORDER — METOPROLOL TARTRATE 5 MG/5ML IV SOLN: 5.0000 mg | INTRAVENOUS | Status: DC | PRN

## 2020-06-26 MED ORDER — PANTOPRAZOLE SODIUM 40 MG PO TBEC
40.0000 mg | DELAYED_RELEASE_TABLET | Freq: Every day | ORAL | Status: DC
  Administered 2020-06-28 – 2020-06-30 (×3): 40 mg via ORAL
  Filled 2020-06-26 (×3): qty 1

## 2020-06-26 MED ORDER — KETOROLAC TROMETHAMINE 15 MG/ML IJ SOLN
15.0000 mg | Freq: Four times a day (QID) | INTRAMUSCULAR | Status: AC | PRN
  Administered 2020-06-27: 15 mg via INTRAVENOUS
  Filled 2020-06-26 (×3): qty 1

## 2020-06-26 MED ORDER — FENTANYL CITRATE (PF) 100 MCG/2ML IJ SOLN
75.0000 ug | Freq: Once | INTRAMUSCULAR | Status: DC
  Administered 2020-06-26: 75 ug via INTRAVENOUS
  Filled 2020-06-26: qty 2

## 2020-06-26 MED ORDER — CHLORHEXIDINE GLUCONATE 4 % EX LIQD
60.0000 mL | Freq: Once | CUTANEOUS | Status: AC
  Administered 2020-06-26: 4 via TOPICAL

## 2020-06-26 MED ORDER — ONDANSETRON HCL 4 MG/2ML IJ SOLN
4.0000 mg | Freq: Four times a day (QID) | INTRAMUSCULAR | Status: DC | PRN
  Administered 2020-06-27 (×2): 4 mg via INTRAVENOUS
  Filled 2020-06-26: qty 2

## 2020-06-26 MED ORDER — ETOMIDATE 2 MG/ML IV SOLN
10.0000 mg | Freq: Once | INTRAVENOUS | Status: AC
  Administered 2020-06-26: 8 mg via INTRAVENOUS
  Filled 2020-06-26: qty 10

## 2020-06-26 MED ORDER — MORPHINE SULFATE (PF) 2 MG/ML IV SOLN
2.0000 mg | INTRAVENOUS | Status: DC | PRN
  Administered 2020-06-27: 2 mg via INTRAVENOUS
  Filled 2020-06-26: qty 1

## 2020-06-26 MED ORDER — RIFAXIMIN 550 MG PO TABS
550.0000 mg | ORAL_TABLET | Freq: Two times a day (BID) | ORAL | Status: DC
Start: 2020-06-26 — End: 2020-06-30
  Filled 2020-06-26 (×4): qty 1

## 2020-06-26 MED ORDER — CLINDAMYCIN PHOSPHATE 900 MG/50ML IV SOLN
900.0000 mg | INTRAVENOUS | Status: AC
  Administered 2020-06-27: 900 mg via INTRAVENOUS
  Filled 2020-06-26: qty 50

## 2020-06-26 MED ORDER — FENTANYL CITRATE (PF) 100 MCG/2ML IJ SOLN: 50.0000 ug | Freq: Once | INTRAMUSCULAR | Status: AC

## 2020-06-26 MED ORDER — CEFAZOLIN SODIUM-DEXTROSE 1-4 GM/50ML-% IV SOLN
1.0000 g | Freq: Once | INTRAVENOUS | Status: AC
  Administered 2020-06-26: 1 g via INTRAVENOUS
  Filled 2020-06-26: qty 50

## 2020-06-26 MED ORDER — LORAZEPAM 2 MG/ML IJ SOLN
INTRAMUSCULAR | Status: AC
  Filled 2020-06-26: qty 1

## 2020-06-26 MED ORDER — ACETAMINOPHEN 650 MG RE SUPP: 325.0000 mg | Freq: Four times a day (QID) | RECTAL | Status: AC | PRN

## 2020-06-26 MED ORDER — LEVOTHYROXINE SODIUM 50 MCG PO TABS
75.0000 ug | ORAL_TABLET | Freq: Every day | ORAL | Status: DC
  Administered 2020-06-28 – 2020-06-30 (×3): 75 ug via ORAL
  Filled 2020-06-26 (×4): qty 1

## 2020-06-26 MED ORDER — FENTANYL CITRATE (PF) 100 MCG/2ML IJ SOLN
INTRAMUSCULAR | Status: AC
  Administered 2020-06-26: 50 ug via INTRAVENOUS
  Filled 2020-06-26: qty 2

## 2020-06-26 MED ORDER — ACETAMINOPHEN 325 MG PO TABS
325.0000 mg | ORAL_TABLET | Freq: Four times a day (QID) | ORAL | Status: AC | PRN
  Administered 2020-06-27 – 2020-06-29 (×5): 325 mg via ORAL
  Filled 2020-06-26 (×5): qty 1

## 2020-06-26 MED ORDER — ONDANSETRON HCL 4 MG PO TABS
4.0000 mg | ORAL_TABLET | Freq: Four times a day (QID) | ORAL | Status: DC | PRN
  Filled 2020-06-26: qty 1

## 2020-06-26 MED ORDER — POVIDONE-IODINE 10 % EX SWAB
2.0000 "application " | Freq: Once | CUTANEOUS | Status: AC
  Administered 2020-06-26: 2 via TOPICAL

## 2020-06-26 MED ORDER — FENTANYL CITRATE (PF) 100 MCG/2ML IJ SOLN
50.0000 ug | INTRAMUSCULAR | Status: DC | PRN
  Filled 2020-06-26: qty 2

## 2020-06-26 NOTE — ED Notes (Signed)
Patient's toes on R foot noted to be slightly discolored and cold to touch. Cap refill <3.  EDP notified and aware. Monitoring of toes and cap refill needed periodically per EDP.

## 2020-06-26 NOTE — ED Notes (Signed)
EDP bedside, this RN assisting with placing new splint and wrap on right ankle.  Patient is alert and oriented VSS at this time. Patient states her tremor has decreased. Patient given pain medication before and during the splint change.

## 2020-06-26 NOTE — Sedation Documentation (Addendum)
During the procedure, post administration of etomidate patient noted to begin to shake in her upper extremities, BP and HR increased during this time. Patient's UE noted to rise into a posturing position over her head. O2 sat decreased into low 70's. Per EDP, supplemental O2 increased and this RN bagged patient to assist with breathing.  Patient noted to open & close eyes after intervention but was unable to respond.  EDP able to manipulate patient's right ankle and place splint and brace.

## 2020-06-26 NOTE — Sedation Documentation (Signed)
ED Provider at bedside. Patient A&O x4 at this time. Patient states that she is shaking, "has hx of tremors". Tremors not visible but felt to touch.

## 2020-06-26 NOTE — Consult Note (Signed)
PODIATRY / FOOT AND ANKLE SURGERY CONSULTATION NOTE  Requesting Physician: Merlyn Lot, MD  Reason for consult: Right unstable ankle fracture  Chief Complaint: Right ankle fracture after fall   HPI: Ariel Phillips is a 87 y.o. female who presents with an injury to the right ankle after suffering a fall while at a car wash when she slipped on a wet rail.  Patient was walking to give the people that wash her car tip and got her foot caught on a rail and it was wet and slipped.  Patient had extreme pain to the right ankle afterwards and felt instability in her ankle.  Patient did not lose consciousness or hit head.  Patient denies pain to any other areas at this time currently.  Patient arrived via EMS with obvious right ankle deformity.  Podiatry/orthopedic team was consulted for further evaluation and management of issue.  Patient was noted to have a small abrasion to the medial aspect of the right ankle after fall but did not appear to have any bone exposed and appeared to be very superficial.  Patient presents currently resting in bed comfortably with her foot elevated with an ice pack on the foot and the small abrasion area covered with a bandage.  Patient states that currently she does have some pain to her right foot and ankle but is resting fairly comfortably in the bed.  Patient does have a splint intact.  PMHx:  Past Medical History:  Diagnosis Date  . Allergic asthma   . Allergic rhinitis due to pollen   . Allergy   . GERD (gastroesophageal reflux disease)   . IBS (irritable bowel syndrome)   . Osteopenia   . Unspecified hypothyroidism     Surgical Hx:  Past Surgical History:  Procedure Laterality Date  . ANAL FISSURE REPAIR  1950's  . BASAL CELL CARCINOMA EXCISION  2000's   nose  . CHOLECYSTECTOMY  2008  . COLONOSCOPY  Multiple   Negative screening exams  . ENDOSCOPIC RETROGRADE CHOLANGIOPANCREATOGRAPHY (ERCP) WITH PROPOFOL N/A 02/13/2019   Procedure: ENDOSCOPIC  RETROGRADE CHOLANGIOPANCREATOGRAPHY (ERCP) WITH PROPOFOL;  Surgeon: Gatha Mayer, MD;  [facility];  Service: Endoscopy;  Laterality: N/A;  . [place] or so   with fissure repair  . REMOVAL OF STONES  02/13/2019   Procedure: REMOVAL OF STONES;  Surgeon: Gatha Mayer, MD;  [facility];  Service: Endoscopy;;  . [name]  02/13/2019   Procedure: [name];  Surgeon: Gatha Mayer, MD;  [facility];  Service: Endoscopy;;  . TUBAL LIGATION      FHx:  Family History  Problem Relation Age of Onset  . COPD Mother   . Cancer Mother        unclear diagnosis  . Transient ischemic attack Mother   . Diabetes Sister   . Hypertension Sister   . Stroke Other   . Cancer Paternal Grandmother        ?breast  . Heart disease Neg Hx     Social History:  reports that she has never smoked. She has never used smokeless tobacco. She reports current alcohol use. She reports that she does not use drugs.  Allergies:  Allergies  Allergen Reactions  . Penicillins Diarrhea    Did it involve swelling of the face/tongue/throat, SOB, or low BP? No Did it involve sudden or severe rash/hives, skin peeling, or any reaction on the inside of your mouth or nose? No Did you need to seek medical  attention at a hospital or doctor's office? Yes When did it last happen?20+ years If all above answers are "NO", may proceed with cephalosporin use.   . Latex Rash    Review of Systems: General ROS: negative Respiratory ROS: no cough, shortness of breath, or wheezing Cardiovascular ROS: no chest pain or dyspnea on exertion Gastrointestinal ROS: no abdominal pain, change in bowel habits, or black or bloody stools Musculoskeletal ROS: positive for - gait disturbance, joint pain, joint stiffness, joint swelling and muscle pain Neurological ROS: negative Dermatological ROS: positive for Right medial ankle skin abrasion  (Not in a hospital  admission)   Physical Exam: General: Alert and oriented.  No apparent distress.  Vascular: DP and PT pulses palpable bilateral.  Biphasic to triphasic signals with hand-held Doppler for right DP and PT pulses.  Capillary fill time intact to digits bilateral.  Mild to moderate swelling to the right ankle with minimal ecchymosis present at this time.  Neuro: Light touch sensation intact in bilateral toes.  Derm: Small skin abrasion to the right medial ankle over the medial malleolus but no bone appears to be exposed and appears to be very superficial.  MSK: Pain on palpation to the right ankle globally.  Deferred any range of motion testing due to instability of ankle.  Obvious right ankle deformity noted.  Patient able to dorsiflex and plantarflex digits right foot.  Results for orders placed or performed during the hospital encounter of 06/26/20 (from the past 48 hour(s))  CBC with Differential     Status: None   Collection Time: 06/26/20  3:19 PM  Result Value Ref Range   WBC 5.6 4.0 - 10.5 K/uL   RBC 4.53 3.87 - 5.11 MIL/uL   Hemoglobin 14.1 12.0 - 15.0 g/dL   HCT 42.7 36.0 - 46.0 %   MCV 94.3 80.0 - 100.0 fL   MCH 31.1 26.0 - 34.0 pg   MCHC 33.0 30.0 - 36.0 g/dL   RDW 12.8 11.5 - 15.5 %   Platelets 241 150 - 400 K/uL   nRBC 0.0 0.0 - 0.2 %   Neutrophils Relative % 62 %   Neutro Abs 3.5 1.7 - 7.7 K/uL   Lymphocytes Relative 22 %   Lymphs Abs 1.3 0.7 - 4.0 K/uL   Monocytes Relative 10 %   Monocytes Absolute 0.6 0.1 - 1.0 K/uL   Eosinophils Relative 5 %   Eosinophils Absolute 0.3 0.0 - 0.5 K/uL   Basophils Relative 1 %   Basophils Absolute 0.0 0.0 - 0.1 K/uL   Immature Granulocytes 0 %   Abs Immature Granulocytes 0.01 0.00 - 0.07 K/uL    Comment: Performed at [facility], [address] [place], [place] [address]  Comprehensive metabolic panel     Status: Abnormal   Collection Time: 06/26/20  3:19 PM  Result Value Ref Range   Sodium 137 135 - 145 mmol/L    Potassium 4.5 3.5 - 5.1 mmol/L   Chloride 106 98 - 111 mmol/L   CO2 25 22 - 32 mmol/L   Glucose, Bld 102 (H) 70 - 99 mg/dL    Comment: Glucose reference range applies only to samples taken after fasting for at least 8 hours.   BUN 21 8 - 23 mg/dL   Creatinine, Ser 0.60 0.44 - 1.00 mg/dL   Calcium 9.1 8.9 - 10.3 mg/dL   Total Protein 7.3 6.5 - 8.1 g/dL   Albumin 3.9 3.5 - 5.0 g/dL   AST 22 15 -  41 U/L   ALT 14 0 - 44 U/L   Alkaline Phosphatase 81 38 - 126 U/L   Total Bilirubin 0.7 0.3 - 1.2 mg/dL   GFR, Estimated >60 >60 mL/min    Comment: (NOTE) Calculated using the CKD-EPI Creatinine Equation (2021)    Anion gap 6 5 - 15    Comment: Performed at [facility], [address], [place], [place] [address]   DG Ankle Complete Right  Result Date: 06/26/2020 CLINICAL DATA:  Fall with right ankle pain. Evaluate for fracture/dislocation EXAM: RIGHT ANKLE - COMPLETE 3+ VIEW COMPARISON:  Right foot radiographs 07/24/2015. FINDINGS: There is a moderately displaced trimalleolar fracture of the right ankle. Oblique fracture involving the distal femoral diaphysis demonstrates moderate lateral displacement. There is a laterally displaced fracture of the medial malleolus. There is resulting posterolateral subluxation of the talus. No tarsal bone fracture identified. The soft tissues are swollen around the ankle without evidence of foreign body or soft tissue emphysema. IMPRESSION: Displaced trimalleolar fracture with posterolateral subluxation of the talus. Electronically Signed   By: Richardean Sale M.D.   On: 06/26/2020 15:52   DG Chest Portable 1 View  Result Date: 06/26/2020 CLINICAL DATA:  Preop for ankle fracture. EXAM: PORTABLE CHEST 1 VIEW COMPARISON:  Chest x-ray 05/22/2019 FINDINGS: The heart size and mediastinal contours are unchanged. Aortic arch calcifications. Incidentally noted azygos fissure. No focal consolidation. No pulmonary edema. No pleural effusion. No pneumothorax. No  acute osseous abnormality. IMPRESSION: No active disease. Electronically Signed   By: Iven Finn M.D.   On: 06/26/2020 16:41    Blood pressure (!) 152/102, pulse 89, temperature 98.2 F (36.8 C), temperature source Oral, height 5\' 3"  (1.6 m), weight 68.9 kg, SpO2 99 %.  Assessment 1. Right unstable bimalleolar ankle fracture closed displaced 2. Right ankle pain  Plan -Patient seen and examined.  Neurovascular status appears to be intact, no evidence of compartment syndrome. -X-ray imaging reviewed and discussed with patient in detail.  Appears to show unstable bimalleolar closed displaced ankle fracture. -Discussed need for closed reduction with splint placement and well compressed bandage to be put on prior to the splint to control swelling further.  Discussed with emergency room physician.  Patient to have close reduction performed later today with splint application. -Does not appear to have an open fracture at this time and appears to have a small abrasion at the anterior medial aspect of the ankle joint likely from the fall.  Recommend Betadine paint to the area and bandage placement along with posterior splint when applying. -Patient currently receiving dose of antibiotic medication.  No further medications needed after that from an infectious standpoint. -Discussed all treatment options with the patient both conservative and surgical attempts at correction including potential risks and complications at this time patient is elected for surgical procedure consisting of right ankle bimalleolar fracture open reduction with internal fixation.  Discussed postoperative course in detail as well as benefits and complications.  Patient would likely need skilled nursing rehab after procedure.  Discussed with patient that this will likely take around 6 to 8 weeks for the bone to heal before she can begin weightbearing again and will likely require physical therapy.  Patient will need to be  weightbearing in a boot for around a month after she is able to walk and then can transition to an ASO ankle brace inside a normal supportive shoe hopefully by the 10-week mark postoperatively. -Patient instructed on vitamin D and calcium supplementation postoperatively. -Patient  to be n.p.o. at midnight on 06/27/2020 for surgery in the morning.  Surgery to be scheduled on 06/27/2020 for around 10 AM. -Patient to be admitted under medicine to the hospital.  I will be consulting physician for this case until patient discharge.  Caroline More, DPM 06/26/2020, 4:45 PM

## 2020-06-26 NOTE — ED Triage Notes (Signed)
[place] ems [place], pt states that she stepped on a wet rail and it caused her to fall. Pt has an obvious rt ankle deformity, +pedal pulse

## 2020-06-26 NOTE — ED Notes (Signed)
Basic lab work sent

## 2020-06-26 NOTE — ED Provider Notes (Signed)
[facility] Emergency Department Provider Note    Event Date/Time   First MD Initiated Contact with Patient 06/26/20 1503     (approximate)  I have reviewed the triage vital signs and the nursing notes.   HISTORY  Chief Complaint Ankle Pain    HPI Ariel Phillips is a 87 y.o. female bolus past medical history presents to the ER for evaluation of right ankle pain.  States she was at the [place] today was walking and slipped on wet metal surface.  Did not hit her head.  Denies any other pain.  Did have obvious deformity to the right ankle.  Not any blood thinners.  No numbness or tingling.  States that when she is not moving she is not having any discomfort but does have pain with movement.    Past Medical History:  Diagnosis Date  . Allergic asthma   . Allergic rhinitis due to pollen   . Allergy   . GERD (gastroesophageal reflux disease)   . IBS (irritable bowel syndrome)   . Osteopenia   . Unspecified hypothyroidism    Family History  Problem Relation Age of Onset  . COPD Mother   . Cancer Mother        unclear diagnosis  . Transient ischemic attack Mother   . Diabetes Sister   . Hypertension Sister   . Stroke Other   . Cancer Paternal Grandmother        ?breast  . Heart disease Neg Hx    Past Surgical History:  Procedure Laterality Date  . ANAL FISSURE REPAIR  1950's  . BASAL CELL CARCINOMA EXCISION  2000's   nose  . CHOLECYSTECTOMY  2008  . COLONOSCOPY  Multiple   Negative screening exams  . ENDOSCOPIC RETROGRADE CHOLANGIOPANCREATOGRAPHY (ERCP) WITH PROPOFOL N/A 02/13/2019   Procedure: ENDOSCOPIC RETROGRADE CHOLANGIOPANCREATOGRAPHY (ERCP) WITH PROPOFOL;  Surgeon: Gatha Mayer, MD;  Location: [facility] ENDOSCOPY;  Service: Endoscopy;  Laterality: N/A;  . [place] or so   with fissure repair  . REMOVAL OF STONES  02/13/2019   Procedure: REMOVAL OF STONES;  Surgeon: Gatha Mayer, MD;  Location: [facility] ENDOSCOPY;  Service:  Endoscopy;;  . [name]  02/13/2019   Procedure: [name];  Surgeon: Gatha Mayer, MD;  Location: [facility] ENDOSCOPY;  Service: Endoscopy;;  . TUBAL LIGATION     Patient Active Problem List   Diagnosis Date Noted  . Ankle fracture 06/26/2020  . Contusion of right ring finger 03/04/2020  . Coronary atherosclerosis of native coronary artery 02/04/2020  . Osteoarthritis of [place] joint of thumb 02/04/2020  . Choledocholithiasis   . Aortic atherosclerosis ([place]) 01/14/2019  . Stiff neck 05/11/2018  . High arches 05/11/2018  . Allergic rhinitis 08/22/2017  . Mood disorder ([place]) 12/30/2016  . MCI (mild cognitive impairment) 12/02/2015  . Benign essential tremor 07/20/2015  . Advanced directives, counseling/discussion 12/19/2013  . Routine general medical examination at a health care facility 12/13/2012  . Hypothyroidism 12/12/2011  . Allergic rhinitis due to pollen   . Allergic asthma   . GERD (gastroesophageal reflux disease)   . IBS (irritable bowel syndrome)   . Osteopenia       Prior to Admission medications   Medication Sig Start Date End Date Taking? Authorizing Provider  aspirin EC 81 MG tablet Take 1 tablet (81 mg total) by mouth daily. 07/19/19  Yes Agbor-Etang, Aaron Edelman, MD  carboxymethylcellulose (REFRESH PLUS) 0.5 % SOLN Place 2 drops into both eyes daily as  needed (dry eyes).    Yes [provider]  diclofenac Sodium (VOLTAREN) 1 % GEL Apply 2 g topically 4 (four) times daily as needed. 05/20/19  Yes Venia Carbon, MD  ibuprofen (ADVIL) 200 MG tablet Take 200 mg by mouth daily as needed for headache or moderate pain.   Yes [provider]  levothyroxine (SYNTHROID) 75 MCG tablet TAKE 1 TABLET EVERY DAY ON EMPTY STOMACHWITH A GLASS OF WATER AT LEAST 30-60 MINBEFORE BREAKFAST 01/07/20  Yes Venia Carbon, MD  loratadine (CLARITIN) 10 MG tablet Take 10 mg by mouth at bedtime.    Yes [provider]  rifaximin (XIFAXAN) 550 MG TABS tablet   09/25/19  Yes [provider]  vitamin B-12 (CYANOCOBALAMIN) 100 MCG tablet Take 100 mcg by mouth daily.   Yes [provider]  alum & mag hydroxide-simeth (MAALOX PLUS) 400-400-40 MG/5ML suspension Take 10 mLs by mouth as needed for indigestion.  Patient not taking: Reported on 06/26/2020    [provider]  omeprazole (PRILOSEC) 20 MG capsule TAKE 1 CAPSULE BY MOUTH AT BEDTIME Patient not taking: Reported on 06/26/2020 01/11/20   Venia Carbon, MD    Allergies Penicillins and Latex    Social History Social History   Tobacco Use  . Smoking status: Never Smoker  . Smokeless tobacco: Never Used  Vaping Use  . Vaping Use: Never used  Substance Use Topics  . Alcohol use: Yes    Comment: rare wine  . Drug use: No    Review of Systems Patient denies headaches, rhinorrhea, blurry vision, numbness, shortness of breath, chest pain, edema, cough, abdominal pain, nausea, vomiting, diarrhea, dysuria, fevers, rashes or hallucinations unless otherwise stated above in HPI. ____________________________________________   PHYSICAL EXAM:  VITAL SIGNS: Vitals:   06/26/20 1800 06/26/20 2000  BP: (!) 153/83 137/68  Pulse: 70 71  Resp: 18 16  Temp:    SpO2: 100% 100%    Constitutional: Alert and oriented.  Eyes: Conjunctivae are normal.  Head: Atraumatic. Nose: No congestion/rhinnorhea. Mouth/Throat: Mucous membranes are moist.   Neck: No stridor. Painless ROM.  Cardiovascular: Normal rate, regular rhythm. Grossly normal heart sounds.  Good peripheral circulation. Respiratory: Normal respiratory effort.  No retractions. Lungs CTAB. Gastrointestinal: Soft and nontender. No distention. No abdominal bruits. No CVA tenderness. Genitourinary: deferred Musculoskeletal: Bony swelling with abrasion in the medial aspect of right ankle.  No clear puncture wound.  Neurovascular intact distally..  No joint effusions. Neurologic:  Normal speech and language. No gross  focal neurologic deficits are appreciated. No facial droop Skin:  Skin is warm, dry and intact. No rash noted. Psychiatric: Mood and affect are normal. Speech and behavior are normal.  ____________________________________________   LABS (all labs ordered are listed, but only abnormal results are displayed)  Results for orders placed or performed during the hospital encounter of 06/26/20 (from the past 24 hour(s))  CBC with Differential     Status: None   Collection Time: 06/26/20  3:19 PM  Result Value Ref Range   WBC 5.6 4.0 - 10.5 K/uL   RBC 4.53 3.87 - 5.11 MIL/uL   Hemoglobin 14.1 12.0 - 15.0 g/dL   HCT 42.7 36.0 - 46.0 %   MCV 94.3 80.0 - 100.0 fL   MCH 31.1 26.0 - 34.0 pg   MCHC 33.0 30.0 - 36.0 g/dL   RDW 12.8 11.5 - 15.5 %   Platelets 241 150 - 400 K/uL   nRBC 0.0 0.0 - 0.2 %  Neutrophils Relative % 62 %   Neutro Abs 3.5 1.7 - 7.7 K/uL   Lymphocytes Relative 22 %   Lymphs Abs 1.3 0.7 - 4.0 K/uL   Monocytes Relative 10 %   Monocytes Absolute 0.6 0.1 - 1.0 K/uL   Eosinophils Relative 5 %   Eosinophils Absolute 0.3 0.0 - 0.5 K/uL   Basophils Relative 1 %   Basophils Absolute 0.0 0.0 - 0.1 K/uL   Immature Granulocytes 0 %   Abs Immature Granulocytes 0.01 0.00 - 0.07 K/uL  Comprehensive metabolic panel     Status: Abnormal   Collection Time: 06/26/20  3:19 PM  Result Value Ref Range   Sodium 137 135 - 145 mmol/L   Potassium 4.5 3.5 - 5.1 mmol/L   Chloride 106 98 - 111 mmol/L   CO2 25 22 - 32 mmol/L   Glucose, Bld 102 (H) 70 - 99 mg/dL   BUN 21 8 - 23 mg/dL   Creatinine, Ser 0.60 0.44 - 1.00 mg/dL   Calcium 9.1 8.9 - 10.3 mg/dL   Total Protein 7.3 6.5 - 8.1 g/dL   Albumin 3.9 3.5 - 5.0 g/dL   AST 22 15 - 41 U/L   ALT 14 0 - 44 U/L   Alkaline Phosphatase 81 38 - 126 U/L   Total Bilirubin 0.7 0.3 - 1.2 mg/dL   GFR, Estimated >60 >60 mL/min   Anion gap 6 5 - 15    ____________________________________________ ____________________________________________  RADIOLOGY  I personally reviewed all radiographic images ordered to evaluate for the above acute complaints and reviewed radiology reports and findings.  These findings were personally discussed with the patient.  Please see medical record for radiology report.  ____________________________________________   PROCEDURES  Procedure(s) performed:  .Sedation  Date/Time: 06/26/2020 8:45 PM Performed by: Merlyn Lot, MD Authorized by: Merlyn Lot, MD   Consent:    Consent obtained:  Written (electronic informed consent)   Risks discussed:  Allergic reaction, dysrhythmia, inadequate sedation, nausea, vomiting, respiratory compromise necessitating ventilatory assistance and intubation, prolonged sedation necessitating reversal and prolonged hypoxia resulting in organ damage Universal protocol:    Procedure explained and questions answered to patient or proxy's satisfaction: yes     Relevant documents present and verified: yes     Test results available: yes     Imaging studies available: yes     Required blood products, implants, devices, and special equipment available: yes     Immediately prior to procedure, a time out was called: yes     Patient identity confirmed:  Arm band Indications:    Procedure performed:  Fracture reduction   Procedure necessitating sedation performed by:  Physician performing sedation Pre-sedation assessment:    Time since last food or drink:  4   ASA classification: class 2 - patient with mild systemic disease     Mouth opening:  2 finger widths   Thyromental distance:  3 finger widths   Mallampati score:  II - soft palate, uvula, fauces visible   Neck mobility: normal     Pre-sedation assessments completed and reviewed: airway patency, cardiovascular function, hydration status, mental status, nausea/vomiting, pain level, respiratory function and  temperature   Immediate pre-procedure details:    Reassessment: Patient reassessed immediately prior to procedure     Reviewed: vital signs, relevant labs/tests and NPO status     Verified: bag valve mask available, emergency equipment available, intubation equipment available, IV patency confirmed, oxygen available, reversal medications available and suction available   Procedure  details (see MAR for exact dosages):    Preoxygenation:  Nasal cannula   Sedation:  Etomidate   Intended level of sedation: deep   Intra-procedure monitoring:  Blood pressure monitoring, continuous pulse oximetry, cardiac monitor, frequent vital sign checks and frequent LOC assessments   Intra-procedure events: hypoxia     Intra-procedure management:  Airway repositioning and BVM ventilation   Total Provider sedation time (minutes):  5 Post-procedure details:    Attendance: Constant attendance by certified staff until patient recovered     Recovery: Patient returned to pre-procedure baseline     Post-sedation assessments completed and reviewed: airway patency, cardiovascular function, hydration status, mental status and respiratory function     Patient is stable for discharge or admission: yes     Procedure completion:  Tolerated well, no immediate complications .Ortho Injury Treatment  Date/Time: 06/26/2020 8:46 PM Performed by: Merlyn Lot, MD Authorized by: Merlyn Lot, MD   Consent:    Consent obtained:  Written   Consent given by:  Patient   Risks discussed:  Fracture, irreducible dislocation, nerve damage, recurrent dislocation, restricted joint movement, stiffness and vascular damageInjury location: ankle Injury type: fracture-dislocation Fracture type: bimalleolar Pre-procedure neurovascular assessment: neurovascularly intact Manipulation performed: yes Skin traction used: yes Reduction successful: yes X-ray confirmed reduction: yes Immobilization: splint Splint type: short  leg Splint Applied by: ED Provider Supplies used: Ortho-Glass Post-procedure neurovascular assessment: post-procedure neurovascularly intact       Critical Care performed:  ____________________________________________   INITIAL IMPRESSION / ASSESSMENT AND PLAN / ED COURSE  Pertinent labs & imaging results that were available during my care of the patient were reviewed by me and considered in my medical decision making (see chart for details).   DDX: Fracture, contusion, dislocation, abrasion  BRIYANA BADMAN is a 87 y.o. who presents to the ED with presentation consistent with right ankle injury.  X-ray shows evidence of by mall fracture with dislocation.  Sound like mechanical fall not consistent with syncope.  Clinical Course as of 06/26/20 2047  Fri Jun 26, 2020  1628 Discussed case with Dr. Luana Shu of podiatry regarding presentation.  Recommends admission for operative fixation.  Agrees with plan for ER reduction and then requesting admission hospitalist for preop optimization for tomorrow morning.  Procedural sedation performed for reduction here in the ER.  Patient did have brief episode of hypoxia which resolved with airway repositioning and brief assistive BVM.  Reduction confirmed by x-ray. Discussed with hospitalist for admission. [PR]    Clinical Course User Index [PR] Merlyn Lot, MD    The patient was evaluated in Emergency Department today for the symptoms described in the history of present illness. He/she was evaluated in the context of the global COVID-19 pandemic, which necessitated consideration that the patient might be at risk for infection with the SARS-CoV-2 virus that causes COVID-19. Institutional protocols and algorithms that pertain to the evaluation of patients at risk for COVID-19 are in a state of rapid change based on information released by regulatory bodies including the CDC and federal and state organizations. These policies and algorithms were  followed during the patient's care in the ED.  As part of my medical decision making, I reviewed the following data within the [place] notes reviewed and incorporated, Labs reviewed, notes from prior ED visits and [place] Controlled Substance Database   ____________________________________________   FINAL CLINICAL IMPRESSION(S) / ED DIAGNOSES  Final diagnoses:  Bimalleolar ankle fracture, right, closed, initial encounter  NEW MEDICATIONS STARTED DURING THIS VISIT:  Current Discharge Medication List       Note:  This document was prepared using Dragon voice recognition software and may include unintentional dictation errors.    Merlyn Lot, MD 06/26/20 2049

## 2020-06-26 NOTE — H&P (Signed)
History and Physical   Ariel Phillips LFY:[id number] DOB: [birth year] DOA: 06/26/2020  PCP: Venia Carbon, MD  Outpatient Specialists: Dr. Carlean Purl, gastroenterology Patient coming from: Home  I have personally briefly reviewed patient's old medical records in [place].  Chief Concern: Right ankle pain  HPI: Ariel Phillips is a 87 y.o. female with medical history significant for hypothyroid, seasonal allergies, GERD, presented to the emergency department for chief concerns of right ankle pain.  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  She denies numbness and tingling of the lower extremity.  She denies nausea, vomiting, chest pain, abdominal pain, vision changes, head trauma, loss of consciousness.  At bedside patient is awake alert and oriented to herself, age, place, current location.  Social history: lives alone with two cats; no tobacco, etoh, drugs. 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  Vaccinations: fully vaccinated and boosted for COVID  ROS: Constitutional: no weight change, no fever ENT/Mouth: no sore throat, no rhinorrhea Eyes: no eye pain, no vision changes Cardiovascular: no chest pain, no dyspnea,  no edema, no palpitations Respiratory: no cough, no sputum, no wheezing Gastrointestinal: no nausea, no vomiting, no diarrhea, no constipation Genitourinary: no urinary incontinence, no dysuria, no hematuria Musculoskeletal: no arthralgias, + myalgias Skin: no skin lesions, no pruritus, Neuro: + weakness, no loss of consciousness, no syncope Psych: no anxiety, no depression, no decrease appetite Heme/Lymph: no bruising, no bleeding  ED Course: Discussed with ED provider, patient requiring hospitalization due to displaced trimalleolar fracture with posterior lateral subluxation of the  talus.  Orthopedic has been consulted and will operate on the patient on 06/27/2020.  Assessment/Plan  Principal Problem:   Ankle fracture Active Problems:   IBS (irritable bowel syndrome)   Hypothyroidism   Displaced trimalleoli are ankle fracture secondary to mechanical fall -pain control with morphine 2 mg IV every 4 hours for severe pain, ketorolac 50 mg IV every 6 hours for moderate pain -Podiatry has been consulted and will take patient to the OR on 06/27/2020 -Heart healthy diet at this time and n.p.o. at midnight -PT, OT, TOC ordered  Hypothyroid-resumed levothyroxine 75 mcg in the morning  IBS-resumed rifaximin 550 mg twice daily  DVT prophylaxis-TED hose at this time -Anticipate surgery in the a.m., therefore no pharmacologic DVT prophylaxis -AM hospitalist team to initiate this as appropriate after podiatry procedure  Tdap booster per EDP  As needed pain medications acetaminophen, ketorolac, morphine, ondansetron  Chart reviewed.   DVT prophylaxis: TED hose, no pharmacologic due to surgery anticipation of the next day, a.m. team to initiate DVT prophylaxis Code Status: DNR Diet: Heart healthy, n.p.o. at midnight Family Communication: No Disposition Plan: Pending clinical course Consults called: Podiatry Admission status: Inpatient  Past Medical History:  Diagnosis Date  . Allergic asthma   . Allergic rhinitis due to pollen   . Allergy   . GERD (gastroesophageal reflux disease)   . IBS (irritable bowel syndrome)   . Osteopenia   . Unspecified hypothyroidism    Past Surgical History:  Procedure Laterality Date  . ANAL FISSURE REPAIR  1950's  . BASAL CELL CARCINOMA EXCISION  2000's   nose  . CHOLECYSTECTOMY  2008  . COLONOSCOPY  Multiple   Negative screening exams  . ENDOSCOPIC RETROGRADE CHOLANGIOPANCREATOGRAPHY (ERCP) WITH PROPOFOL N/A 02/13/2019   Procedure: ENDOSCOPIC RETROGRADE CHOLANGIOPANCREATOGRAPHY (  ERCP) WITH PROPOFOL;  Surgeon: Gatha Mayer, MD;  Location: [facility];  Service: Endoscopy;  Laterality: N/A;  . Newman or so   with fissure repair  . REMOVAL OF STONES  02/13/2019   Procedure: REMOVAL OF STONES;  Surgeon: Gatha Mayer, MD;  Location: [facility];  Service: Endoscopy;;  . [name]  02/13/2019   Procedure: [name];  Surgeon: Gatha Mayer, MD;  Location: [facility];  Service: Endoscopy;;  . TUBAL LIGATION     Social History:  reports that she has never smoked. She has never used smokeless tobacco. She reports current alcohol use. She reports that she does not use drugs.  Allergies  Allergen Reactions  . Penicillins Diarrhea    Did it involve swelling of the face/tongue/throat, SOB, or low BP? No Did it involve sudden or severe rash/hives, skin peeling, or any reaction on the inside of your mouth or nose? No Did you need to seek medical attention at a hospital or doctor's office? Yes When did it last happen?20+ years If all above answers are "NO", may proceed with cephalosporin use.   . Latex Rash   Family History  Problem Relation Age of Onset  . COPD Mother   . Cancer Mother        unclear diagnosis  . Transient ischemic attack Mother   . Diabetes Sister   . Hypertension Sister   . Stroke Other   . Cancer Paternal Grandmother        ?breast  . Heart disease Neg Hx    Family history: Family history reviewed and not pertinent  Prior to Admission medications   Medication Sig Start Date End Date Taking? Authorizing Provider  alum & mag hydroxide-simeth (MYLANTA MAXIMUM STRENGTH) 400-400-40 MG/5ML suspension Take 10 mLs by mouth as needed for indigestion.     [provider]  aspirin EC 81 MG tablet Take 1 tablet (81 mg total) by mouth daily. 07/19/19   Kate Sable, MD  carboxymethylcellulose (REFRESH PLUS) 0.5 % SOLN Place 2 drops into both eyes daily as needed (dry eyes).     [provider]  diclofenac Sodium (VOLTAREN) 1 % GEL  Apply 2 g topically 4 (four) times daily as needed. 05/20/19   Venia Carbon, MD  ibuprofen (ADVIL) 200 MG tablet Take 200 mg by mouth daily as needed for headache or moderate pain.    [provider]  levothyroxine (SYNTHROID) 75 MCG tablet TAKE 1 TABLET EVERY DAY ON EMPTY STOMACHWITH A GLASS OF WATER AT LEAST 30-60 MINBEFORE BREAKFAST 01/07/20   [name] I, MD  loratadine (CLARITIN) 10 MG tablet Take 10 mg by mouth at bedtime.     [provider]  omeprazole (PRILOSEC) 20 MG capsule TAKE 1 CAPSULE BY MOUTH AT BEDTIME 01/11/20   Venia Carbon, MD  rifaximin Doreene Nest) 550 MG TABS tablet  09/25/19   [provider]   Physical Exam: Vitals:   06/26/20 1750 06/26/20 1800 06/26/20 2000 06/26/20 2050  BP: (!) 158/80 (!) 153/83 137/68 128/76  Pulse: 73 70 71 73  Resp: 16 18 16 16   Temp:    98 F (36.7 C)  TempSrc:    Oral  SpO2: 100% 100% 100% 99%  Weight:      Height:       Constitutional: appears age-appropriate, NAD, calm, comfortable Eyes: PERRL, lids and conjunctivae normal ENMT: Mucous membranes are moist. Posterior pharynx clear of any exudate or lesions. Age-appropriate dentition. Hearing appropriate  Neck: normal, supple, no masses, no thyromegaly Respiratory: clear to auscultation bilaterally, no wheezing, no crackles. Normal respiratory effort. No accessory muscle use.  Cardiovascular: Regular rate and rhythm, no murmurs / rubs / gallops. No extremity edema. 2+ pedal pulses. No carotid bruits.  Abdomen: no tenderness, no masses palpated, no hepatosplenomegaly. Bowel sounds positive.  Musculoskeletal: no clubbing / cyanosis. No joint deformity upper and lower extremities. Good ROM, no contractures, no atrophy. Normal muscle tone.  Skin: no rashes, lesions, ulcers. No induration Neurologic: Sensation intact. Strength 5/5 in all 4.  Psychiatric: Normal judgment and insight. Alert and oriented x 3. Normal mood.   EKG: Not indicated  Chest x-ray  on Admission: I personally reviewed and I agree with radiologist reading as below.  DG Ankle Complete Right  Result Date: 06/26/2020 CLINICAL DATA:  Postreduction EXAM: RIGHT ANKLE - COMPLETE 3+ VIEW COMPARISON:  Right ankle radiographs from earlier today FINDINGS: Overlying cast obscures fine bone detail. No significant residual subluxation at the ankle mortise, noting asymmetric mild widening laterally at the ankle mortise. Mild 3 mm lateral and posterior displacement of the distal lateral malleolus fracture fragment. No significant displacement of the medial or posterior malleolar fractures. No focal osseous lesions. IMPRESSION: No significant residual subluxation at the ankle mortise, noting asymmetric mild widening laterally at the ankle mortise. Mild residual 3 mm lateral and posterior displacement of the distal lateral malleolus fracture fragment. No significant displacement of the medial or posterior malleolar fractures. Electronically Signed   By: Ilona Sorrel M.D.   On: 06/26/2020 18:12   DG Ankle Complete Right  Result Date: 06/26/2020 CLINICAL DATA:  Fall with right ankle pain. Evaluate for fracture/dislocation EXAM: RIGHT ANKLE - COMPLETE 3+ VIEW COMPARISON:  Right foot radiographs 07/24/2015. FINDINGS: There is a moderately displaced trimalleolar fracture of the right ankle. Oblique fracture involving the distal femoral diaphysis demonstrates moderate lateral displacement. There is a laterally displaced fracture of the medial malleolus. There is resulting posterolateral subluxation of the talus. No tarsal bone fracture identified. The soft tissues are swollen around the ankle without evidence of foreign body or soft tissue emphysema. IMPRESSION: Displaced trimalleolar fracture with posterolateral subluxation of the talus. Electronically Signed   By: Richardean Sale M.D.   On: 06/26/2020 15:52   DG Chest Portable 1 View  Result Date: 06/26/2020 CLINICAL DATA:  Preop for ankle fracture. EXAM:  PORTABLE CHEST 1 VIEW COMPARISON:  Chest x-ray 05/22/2019 FINDINGS: The heart size and mediastinal contours are unchanged. Aortic arch calcifications. Incidentally noted azygos fissure. No focal consolidation. No pulmonary edema. No pleural effusion. No pneumothorax. No acute osseous abnormality. IMPRESSION: No active disease. Electronically Signed   By: Iven Finn M.D.   On: 06/26/2020 16:41   Labs on Admission: I have personally reviewed following labs  CBC: Recent Labs  Lab 06/26/20 1519  WBC 5.6  NEUTROABS 3.5  HGB 14.1  HCT 42.7  MCV 94.3  PLT 124   Basic Metabolic Panel: Recent Labs  Lab 06/26/20 1519  NA 137  K 4.5  CL 106  CO2 25  GLUCOSE 102*  BUN 21  CREATININE 0.60  CALCIUM 9.1   GFR: Estimated Creatinine Clearance: 47.9 mL/min (by C-G formula based on SCr of 0.6 mg/dL).  Liver Function Tests: Recent Labs  Lab 06/26/20 1519  AST 22  ALT 14  ALKPHOS 81  BILITOT 0.7  PROT 7.3  ALBUMIN 3.9   Urine analysis:    Component Value Date/Time   COLORURINE YELLOW 09/09/2017 1531  APPEARANCEUR CLOUDY (A) 09/09/2017 1531   APPEARANCEUR Hazy 06/11/2013 2307   LABSPEC 1.015 09/09/2017 1531   LABSPEC 1.017 06/11/2013 2307   PHURINE 5.0 09/09/2017 1531   GLUCOSEU NEGATIVE 09/09/2017 1531   GLUCOSEU Negative 06/11/2013 2307   HGBUR MODERATE (A) 09/09/2017 1531   BILIRUBINUR NEGATIVE 09/09/2017 1531   BILIRUBINUR Negative 06/11/2013 2307   KETONESUR NEGATIVE 09/09/2017 1531   PROTEINUR 100 (A) 09/09/2017 1531   UROBILINOGEN 0.2 05/16/2012 1209   NITRITE NEGATIVE 09/09/2017 1531   LEUKOCYTESUR NEGATIVE 09/09/2017 1531   LEUKOCYTESUR Negative 06/11/2013 2307   Amy N Cox D.O. [facility]  If 7PM-7AM, please contact overnight-coverage provider If 7AM-7PM, please contact day coverage provider [email or web address]  06/26/2020, 9:57 PM

## 2020-06-26 NOTE — TOC Initial Note (Signed)
Transition of Care [facility]) - Initial/Assessment Note    Patient Details  Name: Ariel Phillips MRN: [id number] Date of Birth: 1932/07/14  Transition of [facility]) CM/SW Contact:    Anselm Pancoast, RN Phone Number: 06/26/2020, 4:29 PM  Clinical Narrative:                  Damaris Schooner to [place] @ [facility]-patient resides at independent living. Possibility of need for [facility]-SNF. Requesting Fl2 be sent at discharge with patient regardless of disposition in the event she returns home and then decides she needs SNF.         Patient Goals and CMS Choice        Expected Discharge Plan and Services                                                Prior Living Arrangements/Services                       Activities of Daily Living      Permission Sought/Granted                  Emotional Assessment              Admission diagnosis:  Fall/Ankle Injury Patient Active Problem List   Diagnosis Date Noted  . Contusion of right ring finger 03/04/2020  . Coronary atherosclerosis of native coronary artery 02/04/2020  . Osteoarthritis of [place] joint of thumb 02/04/2020  . Choledocholithiasis   . Aortic atherosclerosis ([place]) 01/14/2019  . Stiff neck 05/11/2018  . High arches 05/11/2018  . Allergic rhinitis 08/22/2017  . Mood disorder ([place]) 12/30/2016  . MCI (mild cognitive impairment) 12/02/2015  . Benign essential tremor 07/20/2015  . Advanced directives, counseling/discussion 12/19/2013  . Routine general medical examination at a health care facility 12/13/2012  . Hypothyroidism 12/12/2011  . Allergic rhinitis due to pollen   . Allergic asthma   . GERD (gastroesophageal reflux disease)   . IBS (irritable bowel syndrome)   . Osteopenia    PCP:  Venia Carbon, MD Pharmacy:   [facility] [place] [address] Phone: [phone] Fax:  [phone]  [place], [place] - [place] [address] Phone: [phone] Fax: [phone]  [facility] [place], [facility] [address] [place] [address] Phone: [phone] Fax: [phone]     Social Determinants of Health (SDOH) Interventions    Readmission Risk Interventions No flowsheet data found.

## 2020-06-27 ENCOUNTER — Inpatient Hospital Stay: Payer: Medicare Other | Admitting: Anesthesiology

## 2020-06-27 ENCOUNTER — Inpatient Hospital Stay: Payer: Medicare Other

## 2020-06-27 ENCOUNTER — Encounter: Payer: Self-pay | Admitting: Internal Medicine

## 2020-06-27 ENCOUNTER — Encounter
Admission: EM | Disposition: A | Discharge status: Skilled Nursing Facility | Payer: Self-pay | Source: Home / Self Care | Attending: Family Medicine

## 2020-06-27 HISTORY — PX: ORIF ANKLE FRACTURE: SHX5408

## 2020-06-27 LAB — CBC
HCT: 42.9 % (ref 36.0–46.0)
Hemoglobin: 14.1 g/dL (ref 12.0–15.0)
MCH: 30.7 pg (ref 26.0–34.0)
MCHC: 32.9 g/dL (ref 30.0–36.0)
MCV: 93.5 fL (ref 80.0–100.0)
Platelets: 200 10*3/uL (ref 150–400)
RBC: 4.59 MIL/uL (ref 3.87–5.11)
RDW: 12.6 % (ref 11.5–15.5)
WBC: 10.4 10*3/uL (ref 4.0–10.5)
nRBC: 0 % (ref 0.0–0.2)

## 2020-06-27 LAB — BASIC METABOLIC PANEL
Anion gap: 6 (ref 5–15)
BUN: 16 mg/dL (ref 8–23)
CO2: 25 mmol/L (ref 22–32)
Calcium: 8.5 mg/dL — ABNORMAL LOW (ref 8.9–10.3)
Chloride: 107 mmol/L (ref 98–111)
Creatinine, Ser: 0.56 mg/dL (ref 0.44–1.00)
GFR, Estimated: >60 mL/min (ref >60)
Glucose, Bld: 123 mg/dL — ABNORMAL HIGH (ref 70–99)
Potassium: 3.5 mmol/L (ref 3.5–5.1)
Sodium: 138 mmol/L (ref 135–145)

## 2020-06-27 LAB — SARS CORONAVIRUS 2 (TAT 6-24 HRS): SARS Coronavirus 2: NEGATIVE

## 2020-06-27 SURGERY — OPEN REDUCTION INTERNAL FIXATION (ORIF) ANKLE FRACTURE
Anesthesia: General | Site: Ankle | Laterality: Right

## 2020-06-27 MED ORDER — FENTANYL CITRATE (PF) 100 MCG/2ML IJ SOLN
INTRAMUSCULAR | Status: DC | PRN
  Administered 2020-06-27: 50 ug via INTRAVENOUS
  Administered 2020-06-27 (×2): 25 ug via INTRAVENOUS

## 2020-06-27 MED ORDER — LACTATED RINGERS IV SOLN: INTRAVENOUS | Status: DC | PRN

## 2020-06-27 MED ORDER — FENTANYL CITRATE (PF) 100 MCG/2ML IJ SOLN: 25.0000 ug | INTRAMUSCULAR | Status: DC | PRN

## 2020-06-27 MED ORDER — OXYCODONE HCL 5 MG/5ML PO SOLN: 5.0000 mg | Freq: Once | ORAL | Status: DC | PRN

## 2020-06-27 MED ORDER — LIDOCAINE HCL (CARDIAC) PF 100 MG/5ML IV SOSY
PREFILLED_SYRINGE | INTRAVENOUS | Status: DC | PRN
  Administered 2020-06-27: 60 mg via INTRAVENOUS

## 2020-06-27 MED ORDER — FENTANYL CITRATE (PF) 100 MCG/2ML IJ SOLN
INTRAMUSCULAR | Status: AC
  Filled 2020-06-27: qty 2

## 2020-06-27 MED ORDER — PHENYLEPHRINE HCL (PRESSORS) 10 MG/ML IV SOLN
INTRAVENOUS | Status: DC | PRN
  Administered 2020-06-27: 50 ug via INTRAVENOUS

## 2020-06-27 MED ORDER — DEXAMETHASONE SODIUM PHOSPHATE 10 MG/ML IJ SOLN
INTRAMUSCULAR | Status: DC | PRN
  Administered 2020-06-27: 10 mg via INTRAVENOUS

## 2020-06-27 MED ORDER — OXYCODONE HCL 5 MG PO TABS: 5.0000 mg | ORAL_TABLET | Freq: Once | ORAL | Status: DC | PRN

## 2020-06-27 MED ORDER — PROPOFOL 10 MG/ML IV BOLUS
INTRAVENOUS | Status: DC | PRN
  Administered 2020-06-27: 140 mg via INTRAVENOUS

## 2020-06-27 SURGICAL SUPPLY — 63 items
BIT DRILL 2.0X130 SLD AO (BIT) ×1 IMPLANT
BLADE SURG 15 STRL LF DISP TIS (BLADE) IMPLANT
BLADE SURG 15 STRL SS (BLADE)
BNDG COHESIVE 4X5 TAN STRL (GAUZE/BANDAGES/DRESSINGS) ×2 IMPLANT
BNDG CONFORM 2 STRL LF (GAUZE/BANDAGES/DRESSINGS) ×2 IMPLANT
BNDG CONFORM 3 STRL LF (GAUZE/BANDAGES/DRESSINGS) ×2 IMPLANT
BNDG ELASTIC 4X5.8 VLCR NS LF (GAUZE/BANDAGES/DRESSINGS) ×2 IMPLANT
BNDG ESMARK 4X12 TAN STRL LF (GAUZE/BANDAGES/DRESSINGS) ×2 IMPLANT
BNDG GAUZE 4.5X4.1 6PLY STRL (MISCELLANEOUS) ×2 IMPLANT
CANISTER SUCT 1200ML W/VALVE (MISCELLANEOUS) ×2 IMPLANT
COVER WAND RF STERILE (DRAPES) ×2 IMPLANT
CUFF TOURN SGL QUICK 18X4 (TOURNIQUET CUFF) IMPLANT
CUFF TOURN SGL QUICK 24 (TOURNIQUET CUFF)
CUFF TRNQT CYL 24X4X16.5-23 (TOURNIQUET CUFF) IMPLANT
DRAPE C-ARM XRAY 36X54 (DRAPES) ×2 IMPLANT
DRAPE C-ARMOR (DRAPES) ×2 IMPLANT
DURAPREP 26ML APPLICATOR (WOUND CARE) ×2 IMPLANT
ELECT CAUTERY BLADE 6.4 (BLADE) ×1 IMPLANT
ELECT REM PT RETURN 9FT ADLT (ELECTROSURGICAL) ×2
ELECTRODE REM PT RTRN 9FT ADLT (ELECTROSURGICAL) ×1 IMPLANT
GAUZE SPONGE 4X4 12PLY STRL (GAUZE/BANDAGES/DRESSINGS) ×2 IMPLANT
GAUZE XEROFORM 1X8 LF (GAUZE/BANDAGES/DRESSINGS) ×2 IMPLANT
GLOVE SURG ENC MOIS LTX SZ7 (GLOVE) ×2 IMPLANT
GLOVE SURG UNDER LTX SZ7 (GLOVE) ×2 IMPLANT
GOWN STRL REUS W/ TWL LRG LVL3 (GOWN DISPOSABLE) ×3 IMPLANT
GOWN STRL REUS W/TWL LRG LVL3 (GOWN DISPOSABLE) ×3
KIT TURNOVER KIT A (KITS) ×2 IMPLANT
LABEL OR SOLS (LABEL) ×2 IMPLANT
MANIFOLD NEPTUNE II (INSTRUMENTS) ×2 IMPLANT
NEEDLE HYPO 22GX1.5 SAFETY (NEEDLE) ×2 IMPLANT
NS IRRIG 500ML POUR BTL (IV SOLUTION) ×2 IMPLANT
PACK EXTREMITY ARMC (MISCELLANEOUS) ×2 IMPLANT
PAD ABD DERMACEA PRESS 5X9 (GAUZE/BANDAGES/DRESSINGS) ×2 IMPLANT
PAD PREP 24X41 OB/GYN DISP (PERSONAL CARE ITEMS) ×2 IMPLANT
PLATE FIBULA 9HOLE ANATOMICAL (Plate) ×1 IMPLANT
PLATE MEDIAL MALLEOLUS 2H RT (Plate) ×1 IMPLANT
PUTTY DBX 1CC (Putty) ×2 IMPLANT
PUTTY DBX 1CC DEPUY (Putty) IMPLANT
SCREW LOCK PLATE R3 2.7X11 (Screw) ×3 IMPLANT
SCREW LOCK PLATE R3 2.7X12 (Screw) ×1 IMPLANT
SCREW LOCK PLATE R3 2.7X13 (Screw) ×3 IMPLANT
SCREW LOCK PLATE R3 2.7X14 (Screw) ×1 IMPLANT
SCREW LOCK PLATE R3 2.7X34 (Screw) ×1 IMPLANT
SCREW NONLOCK PLATE R3 2.7X34 (Screw) ×1 IMPLANT
SPLINT CAST 1 STEP 4X30 (MISCELLANEOUS) ×2 IMPLANT
SPLINT FAST PLASTER 5X30 (CAST SUPPLIES) ×1
SPLINT PLASTER CAST FAST 5X30 (CAST SUPPLIES) ×1 IMPLANT
SPONGE LAP 18X18 RF (DISPOSABLE) ×2 IMPLANT
STAPLER SKIN PROX 35W (STAPLE) ×2 IMPLANT
STOCKINETTE M/LG 89821 (MISCELLANEOUS) ×2 IMPLANT
STRAP SAFETY 5IN WIDE (MISCELLANEOUS) ×2 IMPLANT
STRIP CLOSURE SKIN 1/2X4 (GAUZE/BANDAGES/DRESSINGS) IMPLANT
SUT ETHILON 3-0 FS-10 30 BLK (SUTURE) ×4
SUT VIC AB 2-0 CT1 27 (SUTURE) ×1
SUT VIC AB 2-0 CT1 TAPERPNT 27 (SUTURE) ×1 IMPLANT
SUT VIC AB 3-0 SH 27 (SUTURE) ×2
SUT VIC AB 3-0 SH 27X BRD (SUTURE) ×1 IMPLANT
SUTURE EHLN 3-0 FS-10 30 BLK (SUTURE) IMPLANT
SWABSTK COMLB BENZOIN TINCTURE (MISCELLANEOUS) IMPLANT
SYR 10ML LL (SYRINGE) ×2 IMPLANT
SYR 50ML LL SCALE MARK (SYRINGE) ×2 IMPLANT
WIRE OLIVE SMOOTH 1.4MMX60MM (WIRE) ×2 IMPLANT
drill bit ×1 IMPLANT

## 2020-06-27 NOTE — Progress Notes (Signed)
PROGRESS NOTE    Ariel Phillips  IRJ:[id number] DOB: 1932-10-26 DOA: 06/26/2020 PCP: Venia Carbon, MD    Brief Narrative: The patient is 87 years old female with PMH significant for hypothyroidism, seasonal allergies, GERD presents in the emergency department with complaint of right ankle pain.  Patient reports that she tripped and fell over an elevation in the concrete.  She denies any numbness,  tingling of the lower extremity.  X-ray foot shows right ankle fracture.  Podiatry is consulted,  Patient is scheduled to have right ankle bimalleolar ORIF.  Assessment & Plan:   Principal Problem:   Ankle fracture Active Problems:   IBS (irritable bowel syndrome)   Hypothyroidism   Displaced trimalleolar Right ankle fracture secondary to mechanical fall : Patient presented with fall and has right ankle fracture. Adequate pain control with morphine 2 mg IV every 4 hours for severe pain,  Add ketorolac 50 mg IV every 6 hours for moderate pain. Podiatry has been consulted.  Patient is a scheduled to have bimalleolar ORIF today. -PT, OT, TOC consult Patient will be nonweightbearing for 4 to 6 weeks post procedure.  Hypothyroidism -continue levothyroxine 75 mcg in the morning.  IBS -continue rifaximin 550 mg twice daily.   DVT prophylaxis: Lovenox  Code Status: Full code. Family Communication:  No family at bed side. Disposition Plan:  Status is: Inpatient  Remains inpatient appropriate because:Inpatient level of care appropriate due to severity of illness   Dispo: The patient is from: Home              Anticipated d/c is to: SNF              Patient currently is not medically stable to d/c.   Difficult to place patient No   Consultants:   Orthopeadics  Procedures: Right ankle bimalleolar ORIF  Antimicrobials:   Anti-infectives (From admission, onward)   Start     Dose/Rate Route Frequency Ordered Stop   06/27/20 0600  clindamycin (CLEOCIN) IVPB 900 mg         900 mg 100 mL/hr over 30 Minutes Intravenous On call to O.R. 06/26/20 2046 06/27/20 1007   06/26/20 2200  rifaximin (XIFAXAN) tablet 550 mg        550 mg Oral 2 times daily 06/26/20 1818     06/26/20 1600  ceFAZolin (ANCEF) IVPB 1 g/50 mL premix        1 g 100 mL/hr over 30 Minutes Intravenous  Once 06/26/20 1553 06/26/20 1905     Subjective: Patient was seen and examined at bedside.  Overnight events noted.   Patient reports having right ankle pain which is better now. Patient is scheduled to have bimalleolar ORIF later in the day today.  Objective: Vitals:   06/26/20 2000 06/26/20 2050 06/27/20 0034 06/27/20 0440  BP: 137/68 128/76 131/74 (!) 114/59  Pulse: 71 73 84 85  Resp: 16 16 18 18   Temp:  98 F (36.7 C) 98.1 F (36.7 C) 98.3 F (36.8 C)  TempSrc:  Oral Oral Oral  SpO2: 100% 99% 98% 95%  Weight:      Height:        Intake/Output Summary (Last 24 hours) at 06/27/2020 1106 Last data filed at 06/27/2020 0440 Gross per 24 hour  Intake --  Output 1100 ml  Net -1100 ml   Filed Weights   06/26/20 1530  Weight: 68.9 kg    Examination:  General exam: Appears calm and comfortable, not in any acute  distress. Respiratory system: Clear to auscultation. Respiratory effort normal. Cardiovascular system: S1 & S2 heard, RRR. No JVD, murmurs, rubs, gallops or clicks. No pedal edema. Gastrointestinal system: Abdomen is nondistended, soft and nontender. No organomegaly or masses felt. Normal bowel sounds heard. Central nervous system: Alert and oriented. No focal neurological deficits. Extremities: Symmetric 5 x 5 power.  Right foot pain immobilizer. Skin: No rashes, lesions or ulcers Psychiatry: Judgement and insight appear normal. Mood & affect appropriate.     Data Reviewed: I have personally reviewed following labs and imaging studies  CBC: Recent Labs  Lab 06/26/20 1519 06/27/20 0509  WBC 5.6 10.4  NEUTROABS 3.5  --   HGB 14.1 14.1  HCT 42.7 42.9  MCV 94.3  93.5  PLT 241 096   Basic Metabolic Panel: Recent Labs  Lab 06/26/20 1519 06/27/20 0509  NA 137 138  K 4.5 3.5  CL 106 107  CO2 25 25  GLUCOSE 102* 123*  BUN 21 16  CREATININE 0.60 0.56  CALCIUM 9.1 8.5*   GFR: Estimated Creatinine Clearance: 47.9 mL/min (by C-G formula based on SCr of 0.56 mg/dL). Liver Function Tests: Recent Labs  Lab 06/26/20 1519  AST 22  ALT 14  ALKPHOS 81  BILITOT 0.7  PROT 7.3  ALBUMIN 3.9   No results for input(s): LIPASE, AMYLASE in the last 168 hours. No results for input(s): AMMONIA in the last 168 hours. Coagulation Profile: No results for input(s): INR, PROTIME in the last 168 hours. Cardiac Enzymes: No results for input(s): CKTOTAL, CKMB, CKMBINDEX, TROPONINI in the last 168 hours. BNP (last 3 results) No results for input(s): PROBNP in the last 8760 hours. HbA1C: No results for input(s): HGBA1C in the last 72 hours. CBG: No results for input(s): GLUCAP in the last 168 hours. Lipid Profile: No results for input(s): CHOL, HDL, LDLCALC, TRIG, CHOLHDL, LDLDIRECT in the last 72 hours. Thyroid Function Tests: No results for input(s): TSH, T4TOTAL, FREET4, T3FREE, THYROIDAB in the last 72 hours. Anemia Panel: No results for input(s): VITAMINB12, FOLATE, FERRITIN, TIBC, IRON, RETICCTPCT in the last 72 hours. Sepsis Labs: No results for input(s): PROCALCITON, LATICACIDVEN in the last 168 hours.  Recent Results (from the past 240 hour(s))  SARS CORONAVIRUS 2 (TAT 6-24 HRS) Nasopharyngeal Nasopharyngeal Swab     Status: None   Collection Time: 06/26/20  5:03 PM   Specimen: Nasopharyngeal Swab  Result Value Ref Range Status   SARS Coronavirus 2 NEGATIVE NEGATIVE Final    Comment: (NOTE) SARS-CoV-2 target nucleic acids are NOT DETECTED.  The SARS-CoV-2 RNA is generally detectable in upper and lower respiratory specimens during the acute phase of infection. Negative results do not preclude SARS-CoV-2 infection, do not rule  out co-infections with other pathogens, and should not be used as the sole basis for treatment or other patient management decisions. Negative results must be combined with clinical observations, patient history, and epidemiological information. The expected result is Negative.  Fact Sheet for Patients: SugarRoll.be  Fact Sheet for Healthcare Providers: https://www.woods-mathews.com/  This test is not yet approved or cleared by the [place] FDA and  has been authorized for detection and/or diagnosis of SARS-CoV-2 by FDA under an Emergency Use Authorization (EUA). This EUA will remain  in effect (meaning this test can be used) for the duration of the COVID-19 declaration under Se ction 564(b)(1) of the Act, 21 U.S.C. section 360bbb-3(b)(1), unless the authorization is terminated or revoked sooner.  Performed at [facility], [place] [address],  [place], [place] [address]     Radiology Studies: DG Ankle Complete Right  Result Date: 06/26/2020 CLINICAL DATA:  Postreduction EXAM: RIGHT ANKLE - COMPLETE 3+ VIEW COMPARISON:  Right ankle radiographs from earlier today FINDINGS: Overlying cast obscures fine bone detail. No significant residual subluxation at the ankle mortise, noting asymmetric mild widening laterally at the ankle mortise. Mild 3 mm lateral and posterior displacement of the distal lateral malleolus fracture fragment. No significant displacement of the medial or posterior malleolar fractures. No focal osseous lesions. IMPRESSION: No significant residual subluxation at the ankle mortise, noting asymmetric mild widening laterally at the ankle mortise. Mild residual 3 mm lateral and posterior displacement of the distal lateral malleolus fracture fragment. No significant displacement of the medial or posterior malleolar fractures. Electronically Signed   By: Ilona Sorrel M.D.   On: 06/26/2020 18:12   DG Ankle Complete Right  Result  Date: 06/26/2020 CLINICAL DATA:  Fall with right ankle pain. Evaluate for fracture/dislocation EXAM: RIGHT ANKLE - COMPLETE 3+ VIEW COMPARISON:  Right foot radiographs 07/24/2015. FINDINGS: There is a moderately displaced trimalleolar fracture of the right ankle. Oblique fracture involving the distal femoral diaphysis demonstrates moderate lateral displacement. There is a laterally displaced fracture of the medial malleolus. There is resulting posterolateral subluxation of the talus. No tarsal bone fracture identified. The soft tissues are swollen around the ankle without evidence of foreign body or soft tissue emphysema. IMPRESSION: Displaced trimalleolar fracture with posterolateral subluxation of the talus. Electronically Signed   By: Richardean Sale M.D.   On: 06/26/2020 15:52   DG Chest Portable 1 View  Result Date: 06/26/2020 CLINICAL DATA:  Preop for ankle fracture. EXAM: PORTABLE CHEST 1 VIEW COMPARISON:  Chest x-ray 05/22/2019 FINDINGS: The heart size and mediastinal contours are unchanged. Aortic arch calcifications. Incidentally noted azygos fissure. No focal consolidation. No pulmonary edema. No pleural effusion. No pneumothorax. No acute osseous abnormality. IMPRESSION: No active disease. Electronically Signed   By: Iven Finn M.D.   On: 06/26/2020 16:41   Scheduled Meds: . levothyroxine  75 mcg Oral Q0600  . pantoprazole  40 mg Oral Daily  . rifaximin  550 mg Oral BID   Continuous Infusions:   LOS: 1 day    Time spent: 25 mins    Shawna Clamp, MD [facility]   If 7PM-7AM, please contact night-coverage

## 2020-06-27 NOTE — Progress Notes (Signed)
PT Cancellation Note  Patient Details Name: Ariel Phillips MRN: [id number] DOB: 05-Oct-1932   Cancelled Treatment:    Reason Eval/Treat Not Completed: Patient at procedure or test/unavailable (Per podiatry note: pt to go for ankle ORIF morning of 2/26. Will complete current PT order and await postoperative PT order, with weightbearing order and DME order.) Per protocol these patients are not typically evaluated by physical therapy prior to POD1.    Buccola,Allan C 06/27/2020, 9:19 AM

## 2020-06-27 NOTE — Anesthesia Preprocedure Evaluation (Addendum)
Anesthesia Evaluation  Patient identified by MRN, date of birth, ID band Patient awake    Reviewed: Allergy & Precautions, H&P , NPO status , Patient's Chart, lab work & pertinent test results  History of Anesthesia Complications (+) AWARENESS UNDER ANESTHESIA and history of anesthetic complications  Airway Mallampati: III  TM Distance: <3 FB Neck ROM: full    Dental  (+) Chipped, Poor Dentition, Missing   Pulmonary asthma ,    Pulmonary exam normal        Cardiovascular Exercise Tolerance: Good (-) angina+ CAD  (-) DOE Normal cardiovascular exam     Neuro/Psych PSYCHIATRIC DISORDERS negative neurological ROS     GI/Hepatic Neg liver ROS, GERD  Medicated and Controlled,  Endo/Other  Hypothyroidism   Renal/GU      Musculoskeletal   Abdominal   Peds  Hematology negative hematology ROS (+)   Anesthesia Other Findings Past Medical History: No date: Allergic asthma No date: Allergic rhinitis due to pollen No date: Allergy No date: GERD (gastroesophageal reflux disease) No date: IBS (irritable bowel syndrome) No date: Osteopenia No date: Unspecified hypothyroidism  Past Surgical History: 1950's: ANAL FISSURE REPAIR 2000's: BASAL CELL CARCINOMA EXCISION     Comment:  nose 2008: CHOLECYSTECTOMY Multiple: COLONOSCOPY     Comment:  Negative screening exams 02/13/2019: ENDOSCOPIC RETROGRADE CHOLANGIOPANCREATOGRAPHY (ERCP)  WITH PROPOFOL; N/A     Comment:  Procedure: ENDOSCOPIC RETROGRADE               CHOLANGIOPANCREATOGRAPHY (ERCP) WITH PROPOFOL;  Surgeon:               Gatha Mayer, MD;  Location: [facility];  Service:               Endoscopy;  Laterality: N/A; 1953 or so: HEMORRHOID SURGERY     Comment:  with fissure repair 02/13/2019: REMOVAL OF STONES     Comment:  Procedure: REMOVAL OF STONES;  Surgeon: Gatha Mayer,              MD;  Location: [facility];  Service: Endoscopy;; 02/13/2019:  SPHINCTEROTOMY     Comment:  Procedure: SPHINCTEROTOMY;  Surgeon: Gatha Mayer,               MD;  Location: [facility];  Service: Endoscopy;; No date: TUBAL LIGATION  BMI    Body Mass Index: 26.93 kg/m      Reproductive/Obstetrics negative OB ROS                            Anesthesia Physical Anesthesia Plan  ASA: III  Anesthesia Plan: General LMA   Post-op Pain Management: GA combined w/ Regional for post-op pain   Induction: Intravenous  PONV Risk Score and Plan: Ondansetron, Dexamethasone, Midazolam and Treatment may vary due to age or medical condition  Airway Management Planned: LMA  Additional Equipment:   Intra-op Plan:   Post-operative Plan: Extubation in OR  Informed Consent: I have reviewed the patients History and Physical, chart, labs and discussed the procedure including the risks, benefits and alternatives for the proposed anesthesia with the patient or authorized representative who has indicated his/her understanding and acceptance.   Patient has DNR.  Discussed DNR with patient and Suspend DNR.   Dental Advisory Given  Plan Discussed with: Anesthesiologist, CRNA and Surgeon  Anesthesia Plan Comments: (Patient consented for risks of anesthesia including but not limited to:  - adverse reactions to medications - damage  to eyes, teeth, lips or other oral mucosa - nerve damage due to positioning  - sore throat or hoarseness - Damage to heart, brain, nerves, lungs, other parts of body or loss of life  Patient voiced understanding.)      Anesthesia Quick Evaluation

## 2020-06-27 NOTE — Progress Notes (Signed)
OT Cancellation Note  Patient Details Name: Ariel Phillips MRN: [id number] DOB: 1932-11-15   Cancelled Treatment:    Reason Eval/Treat Not Completed: Patient at procedure or test/ unavailable  OT consult received and chart reviewed. Pt currently OTF for ORIF at this time. Will require new OT order post-op'ly in addition to potential WB and DME orders. Will complete this current order and await new instructions following surgery. Thank you.  Gerrianne Scale, [place], OTR/L ascom [phone] 06/27/20, 10:42 AM

## 2020-06-27 NOTE — Transfer of Care (Signed)
Immediate Anesthesia Transfer of Care Note  Patient: Ariel Phillips  Procedure(s) Performed: OPEN REDUCTION INTERNAL FIXATION (ORIF) ANKLE FRACTURE (Right Ankle)  Patient Location: PACU  Anesthesia Type:General  Level of Consciousness: awake, alert  and oriented  Airway & Oxygen Therapy: Patient Spontanous Breathing  Post-op Assessment: Report given to RN  Post vital signs: Reviewed and stable  Last Vitals:  Vitals Value Taken Time  BP    Temp    Pulse    Resp    SpO2      Last Pain:  Vitals:   06/27/20 0457  TempSrc:   PainSc: 3       Patients Stated Pain Goal: 2 (16/57/90 3833)  Complications: No complications documented.

## 2020-06-27 NOTE — H&P (Signed)
HISTORY AND PHYSICAL INTERVAL NOTE:  06/27/2020  9:49 AM  Ariel Phillips  has presented today for surgery, with the diagnosis of Right Ankle Fracture.  The various methods of treatment have been discussed with the patient.  No guarantees were given.  After consideration of risks, benefits and other options for treatment, the patient has consented to surgery.  I have reviewed the patients' chart and labs.    PROCEDURE: RIGHT ANKLE FRACTURE BIMALLOELAR ORIF   A history and physical examination was performed in the hospital.  The patient was reexamined.  There have been no changes to this history and physical examination.  Caroline More, DPM

## 2020-06-27 NOTE — Anesthesia Postprocedure Evaluation (Signed)
Anesthesia Post Note  Patient: Ariel Phillips  Procedure(s) Performed: OPEN REDUCTION INTERNAL FIXATION (ORIF) ANKLE FRACTURE (Right Ankle)  Patient location during evaluation: PACU Anesthesia Type: General Level of consciousness: awake and alert Pain management: pain level controlled Vital Signs Assessment: post-procedure vital signs reviewed and stable Respiratory status: spontaneous breathing, nonlabored ventilation, respiratory function stable and patient connected to nasal cannula oxygen Cardiovascular status: blood pressure returned to baseline and stable Postop Assessment: no apparent nausea or vomiting Anesthetic complications: no   No complications documented.   Last Vitals:  Vitals:   06/27/20 1315 06/27/20 1440  BP: 101/82 (!) 101/48  Pulse: 80 78  Resp: 17 18  Temp: 36.6 C 36.8 C  SpO2: 98% 97%    Last Pain:  Vitals:   06/27/20 1315  TempSrc:   PainSc: 0-No pain                 Precious Haws [place]

## 2020-06-27 NOTE — Op Note (Signed)
PODIATRY / FOOT AND ANKLE SURGERY OPERATIVE REPORT    SURGEON: Caroline More, DPM  PRE-OPERATIVE DIAGNOSIS:  1.  Right ankle bimalleolar closed fracture, unstable, displaced  POST-OPERATIVE DIAGNOSIS: Same  PROCEDURE(S): 1. Right bimalleolar ankle fracture open reduction with internal fixation  HEMOSTASIS: Right thigh tourniquet  ANESTHESIA: general  ESTIMATED BLOOD LOSS: 30 cc  FINDING(S): 1.  Comminuted displaced right distal fibular fracture, Danis Weber type B 2.  Avulsion fracture medial malleolus right displaced  PATHOLOGY/SPECIMEN(S): None  INDICATIONS:   Ariel Phillips is a 87 y.o. female who presents after an injury which she sustained to her right ankle at a car wash when slipping on a wet surface.  Patient arrived to [facility] via EMS.  X-rays were taken of the right ankle showing a displaced bimalleolar ankle fracture.  Closed reduction was performed emergency room podiatry team was consulted for potential surgical intervention.  Discussed all treatment options with the patient both conservative and surgical attempts at correction include potential risks and complications at this time patient is elected for procedure consisting of right ankle bimalleolar fracture open reduction with internal fixation.  All questions answered including postoperative course in detail.  Consent signed and placed in chart..  DESCRIPTION: After obtaining full informed written consent, the patient was brought back to the operating room and placed supine upon the operating table.  The patient received IV antibiotics prior to induction.  A pneumatic thigh tourniquet was placed about the patient's right thigh after obtaining adequate anesthesia, the patient was prepped and draped in the standard fashion..  An Esmarch bandage was used to exsanguinate the right lower extremity pneumatic thigh tourniquet was inflated.  Attention was directed to the lateral aspect of the right  ankle where a linear longitudinal incision was made over the lateral malleolus and distal fibula.  The incision was made from the distal tip of the distal fibula to the distal shaft of the fibula.  The incision was deepened to the subcutaneous tissues utilizing sharp and blunt dissection and care was taken to identify and retract all vital neural and vascular structures no venous contributories were cauterized necessary.  The superficial peroneal nerve was identified during the dissection and retracted anteriorly throughout the remainder the case.  At this time a periosteal incision was then made into the distal fibula around the fracture site and the periosteum was reflected anteriorly and posteriorly thereby exposing the fracture site at the operative site.  It appeared to be slightly comminuted at the syndesmotic area but appeared to be fairly stable but did appear to have mild amount of shortening to the fibula with the posterior spike rotated approximately 4 mm superiorly.  The fracture was debrided with a curette and mobilized.  The fracture was then reduced with the pointed reduction clamp.  Reduction was checked under fluoroscopic imaging and noted to be excellent.  The fibula appeared to be out to length and derotated into a near anatomic position.  At this time the Paragon 28 distal fibular locking plate was placed at the fracture site.  Temporary fixation was then obtained with all of wires for the plate and once again checked under fluoroscopic guidance and the plate appeared to sit in the appropriate position overall.  At this time 5 distal fibular locking screws were placed in 3 screws were placed proximal to the fracture site.  All screws were 2.7 millimeter screws to the appropriate size and length based on measurements in standard AO principles and techniques.  The cotton hook test was performed and there did not appear to be any gapping in the medial clear space or at the syndesmotic level to  suggest syndesmotic injury.  C-arm imaging showed once again the fibula out to length and in a near anatomic position with the appropriate size hardware and placement.  The surgical site was flushed with copious amounts normal sterile saline.  The deep periosteal structures and subcutaneous tissues were reapproximated well coapted with 3-0 Vicryl.  The skin was then reapproximated well coapted with a combination of 3-0 nylon and skin staples.  Attention was then directed to the medial aspect of the right ankle over the medial malleolus.  The incision was made over the medial malleolus slightly posterior to the anterior colliculus due to a small amount of skin damage that was present from previous skin tenting.  The incision was deepened to the subcutaneous tissues utilizing sharp and blunt dissection and care was taken to identify and retract all vital neural and vascular structures and all venous contributories were cauterized necessary.  The great saphenous vein along with saphenous nerve was identified and retracted throughout the remainder the case anteriorly.  At this time a periosteal incision was made into the medial malleolar area and the periosteum was reflected proximally and distally away from the fracture line and the fracture was identified.  The fracture was noted to be displaced and rotated.  The fracture was debrided removing any periosteal structures within the fracture.  A small amount of bone graft was placed within the fracture site as there appeared to be a mild amount of bone loss.  At this time reduction was then held with manual manipulation and with a dental pick.  C-arm imaging was utilized to verify correct position which appeared to be excellent reduction appeared to be well maintained.  At this time while holding the reduction a Paragon 28 hook plate was then applied grasping the small avulsion fracture fragment and tamping it into place so that the hooks were in the fragment and  compressing the fracture line together while holding the manipulation or reduction intact.  Temporary olive wire fixation was obtained.  Once again this was checked under fluoroscopic guidance and noted to be excellent overall with good positioning.  At this time the lag screw was then filled and the second hole in the plate and compression was noted across the fracture line.  This was a 2.7 x 34 mm nonlocking screw.  Another 2.7 x 34 mm locking screw was placed in the superior pole to complete the construct.  The fracture appeared to be very stable.  C-arm imaging was utilized to verify correct position which appeared to be excellent.  The ankle was stressed in multiple areas and noted to have no increase valgus or varus angulation of the talus indicating successful repair the fibula appeared to be out to length and the medial malleolar fracture fragment appeared to be well stabilized.  The ankle mortise appeared to be near anatomic reduction.  The surgical site was flushed with copious months normal sterile saline.  The periosteal capsular structures reapproximated well coapted with 3-0 Vicryl.  The subcutaneous tissue was reapproximated well coapted with 3-0 Vicryl and the skin was then reapproximated well coapted with 3-0 nylon in a combination of simple and horizontal mattress type stitching.  A postoperative dressing was then applied consisting of Xeroform followed by 4 x 4 gauze, ABD, Kerlix, web roll, posterior splint, Ace wrap.  The pneumatic thigh tourniquet was  deflated and prompt hyperemic response was noted all digits of the right foot.  The patient tolerated the procedure and anesthesia well was transferred to recovery room with vital signs stable and vascular status intact all toes of the right foot.  Patient will continue to be followed until discharge.  Postoperative orders placed.  Patient to be nonweightbearing on the right lower extremity at all times.  Patient work with PT and OT starting  tomorrow.  COMPLICATIONS: None  CONDITION: Good, stable  Caroline More, DPM

## 2020-06-27 NOTE — Anesthesia Procedure Notes (Signed)
Procedure Name: LMA Insertion Date/Time: 06/27/2020 10:12 AM Performed by: Lesle Reek, CRNA Pre-anesthesia Checklist: Timeout performed, Patient being monitored, Suction available, Emergency Drugs available and Patient identified Patient Re-evaluated:Patient Re-evaluated prior to induction Oxygen Delivery Method: Circle system utilized Induction Type: IV induction LMA: LMA inserted LMA Size: 4.0 Grade View: Grade I Number of attempts: 1 Placement Confirmation: breath sounds checked- equal and bilateral,  CO2 detector and positive ETCO2 Tube secured with: Tape

## 2020-06-28 ENCOUNTER — Inpatient Hospital Stay: Payer: Medicare Other

## 2020-06-28 ENCOUNTER — Encounter: Payer: Self-pay | Admitting: Internal Medicine

## 2020-06-28 LAB — BASIC METABOLIC PANEL
Anion gap: 6 (ref 5–15)
BUN: 18 mg/dL (ref 8–23)
CO2: 25 mmol/L (ref 22–32)
Calcium: 8.2 mg/dL — ABNORMAL LOW (ref 8.9–10.3)
Chloride: 107 mmol/L (ref 98–111)
Creatinine, Ser: 0.65 mg/dL (ref 0.44–1.00)
GFR, Estimated: >60 mL/min (ref >60)
Glucose, Bld: 117 mg/dL — ABNORMAL HIGH (ref 70–99)
Potassium: 4 mmol/L (ref 3.5–5.1)
Sodium: 138 mmol/L (ref 135–145)

## 2020-06-28 LAB — HEMOGLOBIN AND HEMATOCRIT, BLOOD
HCT: 38.3 % (ref 36.0–46.0)
Hemoglobin: 12.6 g/dL (ref 12.0–15.0)

## 2020-06-28 MED ORDER — ENOXAPARIN SODIUM 40 MG/0.4ML ~~LOC~~ SOLN
40.0000 mg | SUBCUTANEOUS | Status: DC
  Administered 2020-06-28 – 2020-06-29 (×2): 40 mg via SUBCUTANEOUS
  Filled 2020-06-28 (×2): qty 0.4

## 2020-06-28 MED ORDER — DOXYCYCLINE HYCLATE 100 MG PO TABS
100.0000 mg | ORAL_TABLET | Freq: Two times a day (BID) | ORAL | Status: DC
  Administered 2020-06-28 – 2020-06-30 (×4): 100 mg via ORAL
  Filled 2020-06-28 (×4): qty 1

## 2020-06-28 NOTE — Evaluation (Addendum)
Occupational Therapy Evaluation Patient Details Name: Ariel Phillips MRN: [id number] DOB: [birth year] Today's Date: 06/28/2020    History of Present Illness 87 yo F who came to [facility] on 2/25 c Rt ankle pain after slip and fall at carwash and found to have R fx now s/p R ankle ORIF (2/26). Denies hitting head. PMH: GERD, IBS, osteopenia, hypoTSH, CAD. Pt currently resides at [place].   Clinical Impression   Pt seen for OT evaluation this date. Prior to admission, pt was independent in all ADLs/IADLs and functional mobility, living alone in an apartment at [facility]. Pt cooks, drives, takes care of her cats, and enjoys going on long walks without AD. Pt reports no prior falls. Pt currently requires MIN A for sit<>stand transfers, MIN A to take short shuffled forward/backward/side steps bed>sink with RW d/t L knee buckling, and MIN GUARD to complete standing grooming tasks. Pt agreeable and pleasant throughout, however expressed concerns regarding standing balance and adherance to NWB precaution during functional tasks. Pt would benefit from additional skilled OT services to maximize return to PLOF, maximize recall and carryover of learned techniques into daily routines, and minimize risk of future falls. Upon discharge, recommend SNF.    Follow Up Recommendations  SNF    Equipment Recommendations  Other (comment) (defer to next venue of care)       Precautions / Restrictions Precautions Precautions: Fall Restrictions Weight Bearing Restrictions: Yes RLE Weight Bearing: Non weight bearing      Mobility Bed Mobility Overal bed mobility: Modified Independent             General bed mobility comments: Pt able to transfer supine<>sit with increased time and effort only. HOB elevated during supine>sit transfer    Transfers Overall transfer level: Needs assistance Equipment used: Rolling walker (2 wheeled) Transfers: Sit to/from Stand Sit to Stand: Min assist          General transfer comment: Required VC for safe hand placement with RW.    Balance Overall balance assessment: Needs assistance Sitting-balance support: No upper extremity supported;Feet supported Sitting balance-Leahy Scale: Good Sitting balance - Comments: Sitting EOB     Standing balance-Leahy Scale: Poor Standing balance comment: Standing sink-side with b/l UE reliance sink/RW and CGA                           ADL either performed or assessed with clinical judgement   ADL Overall ADL's : Needs assistance/impaired     Grooming: Wash/dry hands;Oral care;Min guard;Standing Grooming Details (indicate cue type and reason): Pt able to stand sink-side requiring RW and CGA d/t pt-reported L knee buckling. For bimanual tasks, pt propping b/l UE on sink for support                             Functional mobility during ADLs: Minimal assistance;Rolling walker General ADL Comments: Pt able to take short shuffled forward/backward/side steps bed>sink with RW and MIN A d/t L knee buckling     Vision Baseline Vision/History: Wears glasses Wears Glasses: At all times              Pertinent Vitals/Pain Pain Assessment: No/denies pain        Extremity/Trunk Assessment Upper Extremity Assessment Upper Extremity Assessment: Overall WFL for tasks assessed   Lower Extremity Assessment Lower Extremity Assessment: Defer to PT evaluation       Communication Communication Communication:  HOH (uses hearing aid however battery is low)   Cognition Arousal/Alertness: Awake/alert Behavior During Therapy: WFL for tasks assessed/performed Overall Cognitive Status: Within Functional Limits for tasks assessed                                 General Comments: Agreeable and pleasant throughout. Expressed concerns regarding adhering to NWB precaution during functional tasks.              Home Living Family/patient expects to be discharged to:: Assisted  living                             Home Equipment: Grab bars - tub/shower   Additional Comments: Pt lives in a second floor apartment at [facility]. Has access to an elevator however pt prefers walking FF stairs for exercise. No stairs upon entry to the building. Pt lives alone and daughter lives in [place].      Prior Functioning/Environment Level of Independence: Independent        Comments: Prior to admission, pt was independent with ADLs/IADLs and functional mobility. Pt cooks, drives, takes care of her cats, and enjoys going on long walks. Pt reports no prior falls        OT Problem List: Decreased strength;Decreased range of motion;Decreased activity tolerance;Impaired balance (sitting and/or standing);Decreased knowledge of precautions      OT Treatment/Interventions: Self-care/ADL training;Therapeutic exercise;Energy conservation;DME and/or AE instruction;Therapeutic activities;Patient/family education;Balance training    OT Goals(Current goals can be found in the care plan section) Acute Rehab OT Goals Patient Stated Goal: to be able to take care of my cats OT Goal Formulation: With patient Time For Goal Achievement: 07/12/20 Potential to Achieve Goals: Fair ADL Goals Pt Will Perform Grooming: with modified independence;standing Pt Will Perform Lower Body Bathing: with set-up;sit to/from stand;with supervision Pt Will Transfer to Toilet: with supervision;ambulating;regular height toilet  OT Frequency: Min 1X/week    AM-PAC OT "6 Clicks" Daily Activity     Outcome Measure Help from another person eating meals?: None Help from another person taking care of personal grooming?: A Little Help from another person toileting, which includes using toliet, bedpan, or urinal?: A Little Help from another person bathing (including washing, rinsing, drying)?: A Little Help from another person to put on and taking off regular upper body clothing?: None Help from  another person to put on and taking off regular lower body clothing?: A Lot 6 Click Score: 19   End of Session Equipment Utilized During Treatment: Gait belt;Rolling walker Nurse Communication: Mobility status  Activity Tolerance: Patient tolerated treatment well Patient left: in bed;with call bell/phone within reach;with bed alarm set  OT Visit Diagnosis: Unsteadiness on feet (R26.81);History of falling (Z91.81);Muscle weakness (generalized) (M62.81)                Time: 8921-1941 OT Time Calculation (min): 41 min Charges:  OT General Charges $OT Visit: 1 Visit OT Evaluation $OT Eval Moderate Complexity: 1 Mod OT Treatments $Self Care/Home Management : 8-22 mins $Therapeutic Activity: 8-22 mins  Fredirick Maudlin, OTR/L [place]

## 2020-06-28 NOTE — Evaluation (Signed)
Physical Therapy Evaluation Patient Details Name: Ariel Phillips MRN: [id number] DOB: 07/02/1932 Today's Date: 06/28/2020   History of Present Illness  87 yo F who came to [facility] on 2/25 c Rt ankle pain [place] and found to have R fx now s/p R ankle ORIF (2/26). Denies hitting head. PMH: GERD, IBS, osteopenia, hypoTSH, CAD. Pt currently resides at [place].  Clinical Impression  Pt seen for PT evaluation now s/p R ankle ORIF. PT educates pt on weight bearing restrictions & technique & hand placement for sit>stand and squat pivot transfers. Pt attempted sit>stand x 3 from elevated EOB with various hand placement & max assist but unable to achieve full upright standing. Pt unable to bear weight through L knee 2/2 increased pain nor able to transfer UE from pushing on EOB to RW once she lifts buttocks from bed. Attempted to have pt perform squat pivot bed>recliner but still unable to weight bear through L knee so pt ultimately performs lateral scoot bed>drop arm recliner with min assist, but not fully able to maintain precautions. PT instructs pt on BLE strengthening exercises as noted below. Nurse notified of pt's c/o L knee pain who reports MD recently ordered imaging. Pt would benefit from STR upon d/c to maximize independence with functional mobility & reduce fall risk prior to return home.     Follow Up Recommendations SNF;Supervision for mobility/OOB    Equipment Recommendations   (TBD in next venue)    Recommendations for Other Services       Precautions / Restrictions Precautions Precautions: Fall Restrictions Weight Bearing Restrictions: Yes RLE Weight Bearing: Non weight bearing      Mobility  Bed Mobility Overal bed mobility: Modified Independent             General bed mobility comments: supine<>sit with HOB elevated without assistance    Transfers Overall transfer level: Needs assistance Equipment used: Rolling walker (2  wheeled) Transfers: Lateral/Scoot Transfers Sit to Stand: Min assist        Lateral/Scoot Transfers: Min assist General transfer comment: lateral scoot to drop arm recliner with min assist with cuing for technique but unsure if pt is able to maintain NWB RLE during transfer  Ambulation/Gait                Stairs            Wheelchair Mobility    Modified Rankin (Stroke Patients Only)       Balance Overall balance assessment: Needs assistance Sitting-balance support: No upper extremity supported;Feet supported Sitting balance-Leahy Scale: Good Sitting balance - Comments: Sitting EOB with supervision     Standing balance-Leahy Scale: Poor Standing balance comment: Standing sink-side with b/l UE reliance sink/RW and CGA                             Pertinent Vitals/Pain Pain Assessment: Faces Faces Pain Scale: Hurts even more Pain Location: L knee Pain Descriptors / Indicators: Discomfort (cracking in knee noted during LAQ) Pain Intervention(s): Monitored during session;Limited activity within patient's tolerance    Home Living Family/patient expects to be discharged to::  ([place])               Home Equipment: Grab bars - tub/shower Additional Comments: Pt lives in a second floor apartment at [facility]. Has access to an elevator however pt prefers walking FF stairs for exercise. No stairs upon entry  to the building. Pt lives alone and daughter lives in [place].    Prior Function Level of Independence: Independent         Comments: Prior to admission, pt was independent with ADLs/IADLs and functional mobility. Pt cooks, drives, takes care of her cats, and enjoys going on long walks. Pt reports no prior falls. Does note she required increased effort to push to stand.     Hand Dominance        Extremity/Trunk Assessment   Upper Extremity Assessment Upper Extremity Assessment: Overall WFL for tasks assessed     Lower Extremity Assessment Lower Extremity Assessment:  (3/5 knee extension in sitting BLE, no over pressure applied 2/2 pain in L knee)       Communication   Communication: HOH  Cognition Arousal/Alertness: Awake/alert Behavior During Therapy: WFL for tasks assessed/performed Overall Cognitive Status: Within Functional Limits for tasks assessed                                 General Comments: Agreable and pleasant throughout. Expressed concerns regarding adhering to NWB precaution during functional tasks.      General Comments      Exercises General Exercises - Lower Extremity Long Arc Quad: AROM;Strengthening;Right;Left;10 reps;Seated Hip ABduction/ADduction: AROM;Strengthening;Both;10 reps;Seated (hip adduction pilow squeezes) Hip Flexion/Marching: AROM;Strengthening;Right;Left;10 reps;Seated    Assessment/Plan    PT Assessment Patient needs continued PT services  PT Problem List Decreased strength;Decreased balance;Decreased knowledge of precautions;Pain;Decreased mobility;Decreased knowledge of use of DME;Cardiopulmonary status limiting activity;Decreased activity tolerance;Decreased safety awareness       PT Treatment Interventions DME instruction;Functional mobility training;Balance training;Patient/family education;Modalities;Neuromuscular re-education;Gait training;Therapeutic activities;Radiographer, therapeutic;Therapeutic exercise;Cognitive remediation;Manual techniques    PT Goals (Current goals can be found in the Care Plan section)  Acute Rehab PT Goals Patient Stated Goal: to be able to take care of my cats PT Goal Formulation: With patient Time For Goal Achievement: 07/12/20 Potential to Achieve Goals: Good    Frequency BID   Barriers to discharge Decreased caregiver support      Co-evaluation               AM-PAC PT "6 Clicks" Mobility  Outcome Measure Help needed turning from your back to your side while  in a flat bed without using bedrails?: None Help needed moving from lying on your back to sitting on the side of a flat bed without using bedrails?: None Help needed moving to and from a bed to a chair (including a wheelchair)?: A Little Help needed standing up from a chair using your arms (e.g., wheelchair or bedside chair)?: Total Help needed to walk in hospital room?: Total Help needed climbing 3-5 steps with a railing? : Total 6 Click Score: 14    End of Session Equipment Utilized During Treatment: Gait belt Activity Tolerance: Patient limited by pain Patient left: in chair;with call bell/phone within reach;with chair alarm set Nurse Communication: Mobility status;Weight bearing status (L knee pain) PT Visit Diagnosis: Muscle weakness (generalized) (M62.81);Difficulty in walking, not elsewhere classified (R26.2);Pain Pain - Right/Left: Left Pain - part of body: Knee    Time: 1416-1440 PT Time Calculation (min) (ACUTE ONLY): 24 min   Charges:   PT Evaluation $PT Eval Low Complexity: 1 Low PT Treatments $Therapeutic Activity: 8-22 mins        Lavone Nian, PT, DPT 06/28/20, 3:04 PM   Waunita Schooner 06/28/2020, 3:02 PM

## 2020-06-28 NOTE — Progress Notes (Signed)
PROGRESS NOTE    Ariel Phillips  QVZ:[id number] DOB: [birth year] DOA: 06/26/2020 PCP: Venia Carbon, MD    Brief Narrative: The patient is 87 years old female with PMH significant for hypothyroidism, seasonal allergies, GERD presents in the emergency department with complaint of right ankle pain.  Patient reports that she tripped and fell over an elevation in the concrete.  She denies any numbness,  tingling of the lower extremity.  X-ray foot shows right ankle fracture.  Podiatry is consulted,  Patient underwent right ankle bimalleolar ORIF.  Postoperative day 1.  Reports pain is improved.  PT and OT evaluation,  patient will be nonweightbearing for 6 to 8 weeks.  Assessment & Plan:   Principal Problem:   Ankle fracture Active Problems:   IBS (irritable bowel syndrome)   Hypothyroidism   Displaced trimalleolar Right ankle fracture secondary to mechanical fall : Patient presented with fall and has right ankle fracture. Adequate pain control with morphine 2 mg IV every 4 hours for severe pain,  Continue ketorolac 50 mg IV every 6 hours for moderate pain. Podiatry has been consulted.  Patient underwent bimalleolar ORIF  2/26. -Today's postoperative day 1,  patient reports pain is improved. Patient will be nonweightbearing for 4 to 6 weeks post procedure. -PT, OT, TOC consult  Hypothyroidism -continue levothyroxine 75 mcg in the morning.  IBS -continue rifaximin 550 mg twice daily.   DVT prophylaxis: Lovenox  Code Status: Full code. Family Communication:  No family at bed side. Disposition Plan:  Status is: Inpatient  Remains inpatient appropriate because:Inpatient level of care appropriate due to severity of illness   Dispo: The patient is from: Home              Anticipated d/c is to: SNF              Patient currently is not medically stable to d/c.   Difficult to place patient No   Consultants:   Orthopeadics  Procedures: Right ankle bimalleolar  ORIF  Antimicrobials:   Anti-infectives (From admission, onward)   Start     Dose/Rate Route Frequency Ordered Stop   06/27/20 0600  clindamycin (CLEOCIN) IVPB 900 mg        900 mg 100 mL/hr over 30 Minutes Intravenous On call to O.R. 06/26/20 2046 06/27/20 1007   06/26/20 2200  rifaximin (XIFAXAN) tablet 550 mg        550 mg Oral 2 times daily 06/26/20 1818     06/26/20 1600  ceFAZolin (ANCEF) IVPB 1 g/50 mL premix        1 g 100 mL/hr over 30 Minutes Intravenous  Once 06/26/20 1553 06/26/20 1905     Subjective: Patient was seen and examined at bedside.  Overnight events noted.   Patient is s/p bimalleolar right ORIF, postoperative day 1. She reports pain is better controlled with pain medications.  Objective: Vitals:   06/27/20 2011 06/28/20 0014 06/28/20 0353 06/28/20 0803  BP: (!) 102/55 102/60 (!) 103/53 (!) 103/57  Pulse: 84 78 70 70  Resp: 16 16 16 18   Temp: 97.6 F (36.4 C) 98 F (36.7 C) 97.7 F (36.5 C) 97.8 F (36.6 C)  TempSrc:    Oral  SpO2: 95% 92% 95% 95%  Weight:      Height:        Intake/Output Summary (Last 24 hours) at 06/28/2020 1146 Last data filed at 06/28/2020 0400 Gross per 24 hour  Intake 800 ml  Output 220 ml  Net 580 ml   Filed Weights   06/26/20 1530  Weight: 68.9 kg    Examination:  General exam: Appears calm and comfortable, not in any acute distress. Respiratory system: Clear to auscultation. Respiratory effort normal. Cardiovascular system: S1 & S2 heard, RRR. No JVD, murmurs, rubs, gallops or clicks. No pedal edema. Gastrointestinal system: Abdomen is nondistended, soft and nontender. No organomegaly or masses felt. Normal bowel sounds heard. Central nervous system: Alert and oriented. No focal neurological deficits. Extremities: Symmetric 5 x 5 power.  Right foot pain immobilizer. Skin: No rashes, lesions or ulcers Psychiatry: Judgement and insight appear normal. Mood & affect appropriate.     Data Reviewed: I have  personally reviewed following labs and imaging studies  CBC: Recent Labs  Lab 06/26/20 1519 06/27/20 0509 06/28/20 0527  WBC 5.6 10.4  --   NEUTROABS 3.5  --   --   HGB 14.1 14.1 12.6  HCT 42.7 42.9 38.3  MCV 94.3 93.5  --   PLT 241 200  --    Basic Metabolic Panel: Recent Labs  Lab 06/26/20 1519 06/27/20 0509 06/28/20 0527  NA 137 138 138  K 4.5 3.5 4.0  CL 106 107 107  CO2 25 25 25   GLUCOSE 102* 123* 117*  BUN 21 16 18   CREATININE 0.60 0.56 0.65  CALCIUM 9.1 8.5* 8.2*   GFR: Estimated Creatinine Clearance: 47.9 mL/min (by C-G formula based on SCr of 0.65 mg/dL). Liver Function Tests: Recent Labs  Lab 06/26/20 1519  AST 22  ALT 14  ALKPHOS 81  BILITOT 0.7  PROT 7.3  ALBUMIN 3.9   No results for input(s): LIPASE, AMYLASE in the last 168 hours. No results for input(s): AMMONIA in the last 168 hours. Coagulation Profile: No results for input(s): INR, PROTIME in the last 168 hours. Cardiac Enzymes: No results for input(s): CKTOTAL, CKMB, CKMBINDEX, TROPONINI in the last 168 hours. BNP (last 3 results) No results for input(s): PROBNP in the last 8760 hours. HbA1C: No results for input(s): HGBA1C in the last 72 hours. CBG: No results for input(s): GLUCAP in the last 168 hours. Lipid Profile: No results for input(s): CHOL, HDL, LDLCALC, TRIG, CHOLHDL, LDLDIRECT in the last 72 hours. Thyroid Function Tests: No results for input(s): TSH, T4TOTAL, FREET4, T3FREE, THYROIDAB in the last 72 hours. Anemia Panel: No results for input(s): VITAMINB12, FOLATE, FERRITIN, TIBC, IRON, RETICCTPCT in the last 72 hours. Sepsis Labs: No results for input(s): PROCALCITON, LATICACIDVEN in the last 168 hours.  Recent Results (from the past 240 hour(s))  SARS CORONAVIRUS 2 (TAT 6-24 HRS) Nasopharyngeal Nasopharyngeal Swab     Status: None   Collection Time: 06/26/20  5:03 PM   Specimen: Nasopharyngeal Swab  Result Value Ref Range Status   SARS Coronavirus 2 NEGATIVE NEGATIVE  Final    Comment: (NOTE) SARS-CoV-2 target nucleic acids are NOT DETECTED.  The SARS-CoV-2 RNA is generally detectable in upper and lower respiratory specimens during the acute phase of infection. Negative results do not preclude SARS-CoV-2 infection, do not rule out co-infections with other pathogens, and should not be used as the sole basis for treatment or other patient management decisions. Negative results must be combined with clinical observations, patient history, and epidemiological information. The expected result is Negative.  Fact Sheet for Patients: SugarRoll.be  Fact Sheet for Healthcare Providers: https://www.woods-mathews.com/  This test is not yet approved or cleared by the [place] FDA and  has been authorized for detection and/or diagnosis of SARS-CoV-2 by FDA  under an Emergency Use Authorization (EUA). This EUA will remain  in effect (meaning this test can be used) for the duration of the COVID-19 declaration under Se ction 564(b)(1) of the Act, 21 U.S.C. section 360bbb-3(b)(1), unless the authorization is terminated or revoked sooner.  Performed at [facility], [place] [address], [place], [place] [address]     Radiology Studies: DG Ankle 2 Views Right  Result Date: 06/27/2020 CLINICAL DATA:  ORIF comminuted trimalleolar ankle fracture. EXAM: RIGHT ANKLE - 2 VIEW; DG C-ARM 1-60 MIN COMPARISON:  Right ankle radiographs-06/26/2020 FLUOROSCOPY TIME:  45 seconds FINDINGS: 9 spot intraoperative fluoroscopic images of the right ankle are provided for review. Images demonstrate on going sideplate fixation of the distal fibula and medial malleolus. Alignment appears anatomic. Expected overlying skin staples and scattered foci of subcutaneous emphysema. No radiopaque foreign body. IMPRESSION: Post ORIF of trimalleolar ankle fracture without evidence of complication. Electronically Signed   By: Sandi Mariscal M.D.   On:  06/27/2020 13:26   DG Ankle Complete Right  Result Date: 06/27/2020 CLINICAL DATA:  ORIF right ankle fracture EXAM: RIGHT ANKLE - COMPLETE 3+ VIEW COMPARISON:  06/26/2020 FINDINGS: Post sideplate fixation of obliquely oriented distal fibular fracture and comminuted fracture of the medial malleolus. There is no definitive dedicated fixation of the posterior malleolar fracture however alignment appears anatomic. Interval restoration of the ankle mortise. Expected adjacent soft tissue swelling, scattered foci of subcutaneous emphysema and overlying skin staples. No radiopaque foreign body. IMPRESSION: Post sideplate fixation of distal fibular fracture and comminuted fracture of the medial malleolus, now with anatomic alignment Electronically Signed   By: Sandi Mariscal M.D.   On: 06/27/2020 13:22   DG Ankle Complete Right  Result Date: 06/26/2020 CLINICAL DATA:  Postreduction EXAM: RIGHT ANKLE - COMPLETE 3+ VIEW COMPARISON:  Right ankle radiographs from earlier today FINDINGS: Overlying cast obscures fine bone detail. No significant residual subluxation at the ankle mortise, noting asymmetric mild widening laterally at the ankle mortise. Mild 3 mm lateral and posterior displacement of the distal lateral malleolus fracture fragment. No significant displacement of the medial or posterior malleolar fractures. No focal osseous lesions. IMPRESSION: No significant residual subluxation at the ankle mortise, noting asymmetric mild widening laterally at the ankle mortise. Mild residual 3 mm lateral and posterior displacement of the distal lateral malleolus fracture fragment. No significant displacement of the medial or posterior malleolar fractures. Electronically Signed   By: Ilona Sorrel M.D.   On: 06/26/2020 18:12   DG Ankle Complete Right  Result Date: 06/26/2020 CLINICAL DATA:  Fall with right ankle pain. Evaluate for fracture/dislocation EXAM: RIGHT ANKLE - COMPLETE 3+ VIEW COMPARISON:  Right foot radiographs  07/24/2015. FINDINGS: There is a moderately displaced trimalleolar fracture of the right ankle. Oblique fracture involving the distal femoral diaphysis demonstrates moderate lateral displacement. There is a laterally displaced fracture of the medial malleolus. There is resulting posterolateral subluxation of the talus. No tarsal bone fracture identified. The soft tissues are swollen around the ankle without evidence of foreign body or soft tissue emphysema. IMPRESSION: Displaced trimalleolar fracture with posterolateral subluxation of the talus. Electronically Signed   By: Richardean Sale M.D.   On: 06/26/2020 15:52   DG Chest Portable 1 View  Result Date: 06/26/2020 CLINICAL DATA:  Preop for ankle fracture. EXAM: PORTABLE CHEST 1 VIEW COMPARISON:  Chest x-ray 05/22/2019 FINDINGS: The heart size and mediastinal contours are unchanged. Aortic arch calcifications. Incidentally noted azygos fissure. No focal consolidation. No pulmonary edema. No pleural  effusion. No pneumothorax. No acute osseous abnormality. IMPRESSION: No active disease. Electronically Signed   By: Iven Finn M.D.   On: 06/26/2020 16:41   DG C-Arm 1-60 Min  Result Date: 06/27/2020 CLINICAL DATA:  ORIF comminuted trimalleolar ankle fracture. EXAM: RIGHT ANKLE - 2 VIEW; DG C-ARM 1-60 MIN COMPARISON:  Right ankle radiographs-06/26/2020 FLUOROSCOPY TIME:  45 seconds FINDINGS: 9 spot intraoperative fluoroscopic images of the right ankle are provided for review. Images demonstrate on going sideplate fixation of the distal fibula and medial malleolus. Alignment appears anatomic. Expected overlying skin staples and scattered foci of subcutaneous emphysema. No radiopaque foreign body. IMPRESSION: Post ORIF of trimalleolar ankle fracture without evidence of complication. Electronically Signed   By: Sandi Mariscal M.D.   On: 06/27/2020 13:26   Scheduled Meds: . levothyroxine  75 mcg Oral Q0600  . pantoprazole  40 mg Oral Daily  . rifaximin  550  mg Oral BID   Continuous Infusions:   LOS: 2 days    Time spent: 25 mins    Shawna Clamp, MD [facility]   If 7PM-7AM, please contact night-coverage

## 2020-06-28 NOTE — Progress Notes (Signed)
PODIATRY / FOOT AND ANKLE SURGERY PROGRESS NOTE  Requesting Physician: Merlyn Lot, MD  Reason for consult: Right unstable ankle fracture  Chief Complaint: Right ankle fracture after fall, left ankle pain   HPI: Ariel Phillips is a 87 y.o. female who presents status post 1 day right bimalleolar ankle fracture open reduction with internal fixation.  Patient has tolerated procedure well overall and is complaining of minimal to no pain at this time to the right foot or ankle.  Patient is kept splint to the right ankle clean, dry, and intact since surgery and has not put weight on the right foot.  Patient doing well overall today.  Patient denies nausea, vomiting, fever, chills.  Patient does complain of some mild pain to the left medial ankle and has noticed a bruised on the left ankle and wonders if she hurt anything on that area as well.  Patient notes that she was having a little pain to the ankle overall today when working with occupational therapy.  PMHx:  Past Medical History:  Diagnosis Date  . Allergic asthma   . Allergic rhinitis due to pollen   . Allergy   . GERD (gastroesophageal reflux disease)   . IBS (irritable bowel syndrome)   . Osteopenia   . Unspecified hypothyroidism     Surgical Hx:  Past Surgical History:  Procedure Laterality Date  . ANAL FISSURE REPAIR  1950's  . BASAL CELL CARCINOMA EXCISION  2000's   nose  . CHOLECYSTECTOMY  2008  . COLONOSCOPY  Multiple   Negative screening exams  . ENDOSCOPIC RETROGRADE CHOLANGIOPANCREATOGRAPHY (ERCP) WITH PROPOFOL N/A 02/13/2019   Procedure: ENDOSCOPIC RETROGRADE CHOLANGIOPANCREATOGRAPHY (ERCP) WITH PROPOFOL;  Surgeon: Gatha Mayer, MD;  Location: [facility];  Service: Endoscopy;  Laterality: N/A;  . Williams or so   with fissure repair  . REMOVAL OF STONES  02/13/2019   Procedure: REMOVAL OF STONES;  Surgeon: Gatha Mayer, MD;  Location: [facility];  Service: Endoscopy;;  . [name]   02/13/2019   Procedure: [name];  Surgeon: Gatha Mayer, MD;  Location: [facility];  Service: Endoscopy;;  . TUBAL LIGATION      FHx:  Family History  Problem Relation Age of Onset  . COPD Mother   . Cancer Mother        unclear diagnosis  . Transient ischemic attack Mother   . Diabetes Sister   . Hypertension Sister   . Stroke Other   . Cancer Paternal Grandmother        ?breast  . Heart disease Neg Hx     Social History:  reports that she has never smoked. She has never used smokeless tobacco. She reports current alcohol use. She reports that she does not use drugs.  Allergies:  Allergies  Allergen Reactions  . Penicillins Diarrhea    Did it involve swelling of the face/tongue/throat, SOB, or low BP? No Did it involve sudden or severe rash/hives, skin peeling, or any reaction on the inside of your mouth or nose? No Did you need to seek medical attention at a hospital or doctor's office? Yes When did it last happen?20+ years If all above answers are "NO", may proceed with cephalosporin use.   . Latex Rash    Medications Prior to Admission  Medication Sig Dispense Refill  . aspirin EC 81 MG tablet Take 1 tablet (81 mg total) by mouth daily.    . carboxymethylcellulose (REFRESH PLUS) 0.5 % SOLN Place 2 drops into  both eyes daily as needed (dry eyes).     Marland Kitchen diclofenac Sodium (VOLTAREN) 1 % GEL Apply 2 g topically 4 (four) times daily as needed. 150 g 5  . ibuprofen (ADVIL) 200 MG tablet Take 200 mg by mouth daily as needed for headache or moderate pain.    Marland Kitchen levothyroxine (SYNTHROID) 75 MCG tablet TAKE 1 TABLET EVERY DAY ON EMPTY STOMACHWITH A GLASS OF WATER AT LEAST 30-60 MINBEFORE BREAKFAST 90 tablet 3  . loratadine (CLARITIN) 10 MG tablet Take 10 mg by mouth at bedtime.     . rifaximin (XIFAXAN) 550 MG TABS tablet     . vitamin B-12 (CYANOCOBALAMIN) 100 MCG tablet Take 100 mcg by mouth daily.    Marland Kitchen alum & mag hydroxide-simeth (MAALOX PLUS) 400-400-40  MG/5ML suspension Take 10 mLs by mouth as needed for indigestion.  (Patient not taking: Reported on 06/26/2020)    . omeprazole (PRILOSEC) 20 MG capsule TAKE 1 CAPSULE BY MOUTH AT BEDTIME (Patient not taking: Reported on 06/26/2020) 90 capsule 3    Physical Exam: General: Alert and oriented.  No apparent distress.  Splint to the right lower extremity appears to be clean, dry, and intact.  No strikethrough noted on the splint.  Patient able to dorsiflex and plantarflex toes the right foot.  Light touch sensation appears to be intact to the right foot.  Capillary fill time intact to digits of the right foot.  Pes cavus foot type bilateral.  Left ankle with small bruise at the medial aspect of the ankle.  Mild pain on palpation of the medial malleolus.  Minimal to no pain with ankle joint dorsiflexion, plantarflexion, inversion or eversion left.  No pain on palpation to left lateral malleolus or fifth metatarsal base.  No pain on palpation left foot.  Results for orders placed or performed during the hospital encounter of 06/26/20 (from the past 48 hour(s))  CBC with Differential     Status: None   Collection Time: 06/26/20  3:19 PM  Result Value Ref Range   WBC 5.6 4.0 - 10.5 K/uL   RBC 4.53 3.87 - 5.11 MIL/uL   Hemoglobin 14.1 12.0 - 15.0 g/dL   HCT 42.7 36.0 - 46.0 %   MCV 94.3 80.0 - 100.0 fL   MCH 31.1 26.0 - 34.0 pg   MCHC 33.0 30.0 - 36.0 g/dL   RDW 12.8 11.5 - 15.5 %   Platelets 241 150 - 400 K/uL   nRBC 0.0 0.0 - 0.2 %   Neutrophils Relative % 62 %   Neutro Abs 3.5 1.7 - 7.7 K/uL   Lymphocytes Relative 22 %   Lymphs Abs 1.3 0.7 - 4.0 K/uL   Monocytes Relative 10 %   Monocytes Absolute 0.6 0.1 - 1.0 K/uL   Eosinophils Relative 5 %   Eosinophils Absolute 0.3 0.0 - 0.5 K/uL   Basophils Relative 1 %   Basophils Absolute 0.0 0.0 - 0.1 K/uL   Immature Granulocytes 0 %   Abs Immature Granulocytes 0.01 0.00 - 0.07 K/uL    Comment: Performed at [facility], [address] [place], [place] [address]  Comprehensive metabolic panel     Status: Abnormal   Collection Time: 06/26/20  3:19 PM  Result Value Ref Range   Sodium 137 135 - 145 mmol/L   Potassium 4.5 3.5 - 5.1 mmol/L   Chloride 106 98 - 111 mmol/L   CO2 25 22 - 32 mmol/L   Glucose, Bld 102 (H) 70 -  99 mg/dL    Comment: Glucose reference range applies only to samples taken after fasting for at least 8 hours.   BUN 21 8 - 23 mg/dL   Creatinine, Ser 0.60 0.44 - 1.00 mg/dL   Calcium 9.1 8.9 - 10.3 mg/dL   Total Protein 7.3 6.5 - 8.1 g/dL   Albumin 3.9 3.5 - 5.0 g/dL   AST 22 15 - 41 U/L   ALT 14 0 - 44 U/L   Alkaline Phosphatase 81 38 - 126 U/L   Total Bilirubin 0.7 0.3 - 1.2 mg/dL   GFR, Estimated >60 >60 mL/min    Comment: (NOTE) Calculated using the CKD-EPI Creatinine Equation (2021)    Anion gap 6 5 - 15    Comment: Performed at [facility], [address] [place], [place] [address]  SARS CORONAVIRUS 2 (TAT 6-24 HRS) Nasopharyngeal Nasopharyngeal Swab     Status: None   Collection Time: 06/26/20  5:03 PM   Specimen: Nasopharyngeal Swab  Result Value Ref Range   SARS Coronavirus 2 NEGATIVE NEGATIVE    Comment: (NOTE) SARS-CoV-2 target nucleic acids are NOT DETECTED.  The SARS-CoV-2 RNA is generally detectable in upper and lower respiratory specimens during the acute phase of infection. Negative results do not preclude SARS-CoV-2 infection, do not rule out co-infections with other pathogens, and should not be used as the sole basis for treatment or other patient management decisions. Negative results must be combined with clinical observations, patient history, and epidemiological information. The expected result is Negative.  Fact Sheet for Patients: SugarRoll.be  Fact Sheet for Healthcare Providers: https://www.woods-mathews.com/  This test is not yet approved or cleared by the [place] FDA and  has been authorized for  detection and/or diagnosis of SARS-CoV-2 by FDA under an Emergency Use Authorization (EUA). This EUA will remain  in effect (meaning this test can be used) for the duration of the COVID-19 declaration under Se ction 564(b)(1) of the Act, 21 U.S.C. section 360bbb-3(b)(1), unless the authorization is terminated or revoked sooner.  Performed at [facility], [place] [address], [place], [place] [address]   Basic metabolic panel     Status: Abnormal   Collection Time: 06/27/20  5:09 AM  Result Value Ref Range   Sodium 138 135 - 145 mmol/L   Potassium 3.5 3.5 - 5.1 mmol/L   Chloride 107 98 - 111 mmol/L   CO2 25 22 - 32 mmol/L   Glucose, Bld 123 (H) 70 - 99 mg/dL    Comment: Glucose reference range applies only to samples taken after fasting for at least 8 hours.   BUN 16 8 - 23 mg/dL   Creatinine, Ser 0.56 0.44 - 1.00 mg/dL   Calcium 8.5 (L) 8.9 - 10.3 mg/dL   GFR, Estimated >60 >60 mL/min    Comment: (NOTE) Calculated using the CKD-EPI Creatinine Equation (2021)    Anion gap 6 5 - 15    Comment: Performed at [facility], [address] [place], [place] [address]  CBC     Status: None   Collection Time: 06/27/20  5:09 AM  Result Value Ref Range   WBC 10.4 4.0 - 10.5 K/uL   RBC 4.59 3.87 - 5.11 MIL/uL   Hemoglobin 14.1 12.0 - 15.0 g/dL   HCT 42.9 36.0 - 46.0 %   MCV 93.5 80.0 - 100.0 fL   MCH 30.7 26.0 - 34.0 pg   MCHC 32.9 30.0 - 36.0 g/dL   RDW 12.6 11.5 -  15.5 %   Platelets 200 150 - 400 K/uL   nRBC 0.0 0.0 - 0.2 %    Comment: Performed at [facility], [address] [place], [place] [address]  Hemoglobin and hematocrit, blood     Status: None   Collection Time: 06/28/20  5:27 AM  Result Value Ref Range   Hemoglobin 12.6 12.0 - 15.0 g/dL   HCT 38.3 36.0 - 46.0 %    Comment: Performed at [facility], [address] [place], [place] [address]  Basic metabolic panel     Status: Abnormal   Collection Time: 06/28/20  5:27 AM  Result  Value Ref Range   Sodium 138 135 - 145 mmol/L   Potassium 4.0 3.5 - 5.1 mmol/L   Chloride 107 98 - 111 mmol/L   CO2 25 22 - 32 mmol/L   Glucose, Bld 117 (H) 70 - 99 mg/dL    Comment: Glucose reference range applies only to samples taken after fasting for at least 8 hours.   BUN 18 8 - 23 mg/dL   Creatinine, Ser 0.65 0.44 - 1.00 mg/dL   Calcium 8.2 (L) 8.9 - 10.3 mg/dL   GFR, Estimated >60 >60 mL/min    Comment: (NOTE) Calculated using the CKD-EPI Creatinine Equation (2021)    Anion gap 6 5 - 15    Comment: Performed at [facility], [address], [place], [place] [address]   DG Ankle 2 Views Right  Result Date: 06/27/2020 CLINICAL DATA:  ORIF comminuted trimalleolar ankle fracture. EXAM: RIGHT ANKLE - 2 VIEW; DG C-ARM 1-60 MIN COMPARISON:  Right ankle radiographs-06/26/2020 FLUOROSCOPY TIME:  45 seconds FINDINGS: 9 spot intraoperative fluoroscopic images of the right ankle are provided for review. Images demonstrate on going sideplate fixation of the distal fibula and medial malleolus. Alignment appears anatomic. Expected overlying skin staples and scattered foci of subcutaneous emphysema. No radiopaque foreign body. IMPRESSION: Post ORIF of trimalleolar ankle fracture without evidence of complication. Electronically Signed   By: Sandi Mariscal M.D.   On: 06/27/2020 13:26   DG Ankle Complete Right  Result Date: 06/27/2020 CLINICAL DATA:  ORIF right ankle fracture EXAM: RIGHT ANKLE - COMPLETE 3+ VIEW COMPARISON:  06/26/2020 FINDINGS: Post sideplate fixation of obliquely oriented distal fibular fracture and comminuted fracture of the medial malleolus. There is no definitive dedicated fixation of the posterior malleolar fracture however alignment appears anatomic. Interval restoration of the ankle mortise. Expected adjacent soft tissue swelling, scattered foci of subcutaneous emphysema and overlying skin staples. No radiopaque foreign body. IMPRESSION: Post sideplate fixation of distal  fibular fracture and comminuted fracture of the medial malleolus, now with anatomic alignment Electronically Signed   By: Sandi Mariscal M.D.   On: 06/27/2020 13:22   DG Ankle Complete Right  Result Date: 06/26/2020 CLINICAL DATA:  Postreduction EXAM: RIGHT ANKLE - COMPLETE 3+ VIEW COMPARISON:  Right ankle radiographs from earlier today FINDINGS: Overlying cast obscures fine bone detail. No significant residual subluxation at the ankle mortise, noting asymmetric mild widening laterally at the ankle mortise. Mild 3 mm lateral and posterior displacement of the distal lateral malleolus fracture fragment. No significant displacement of the medial or posterior malleolar fractures. No focal osseous lesions. IMPRESSION: No significant residual subluxation at the ankle mortise, noting asymmetric mild widening laterally at the ankle mortise. Mild residual 3 mm lateral and posterior displacement of the distal lateral malleolus fracture fragment. No significant displacement of the medial or posterior malleolar fractures. Electronically Signed   By: Corene Cornea  A Poff M.D.   On: 06/26/2020 18:12   DG Ankle Complete Right  Result Date: 06/26/2020 CLINICAL DATA:  Fall with right ankle pain. Evaluate for fracture/dislocation EXAM: RIGHT ANKLE - COMPLETE 3+ VIEW COMPARISON:  Right foot radiographs 07/24/2015. FINDINGS: There is a moderately displaced trimalleolar fracture of the right ankle. Oblique fracture involving the distal femoral diaphysis demonstrates moderate lateral displacement. There is a laterally displaced fracture of the medial malleolus. There is resulting posterolateral subluxation of the talus. No tarsal bone fracture identified. The soft tissues are swollen around the ankle without evidence of foreign body or soft tissue emphysema. IMPRESSION: Displaced trimalleolar fracture with posterolateral subluxation of the talus. Electronically Signed   By: Richardean Sale M.D.   On: 06/26/2020 15:52   DG Chest Portable 1  View  Result Date: 06/26/2020 CLINICAL DATA:  Preop for ankle fracture. EXAM: PORTABLE CHEST 1 VIEW COMPARISON:  Chest x-ray 05/22/2019 FINDINGS: The heart size and mediastinal contours are unchanged. Aortic arch calcifications. Incidentally noted azygos fissure. No focal consolidation. No pulmonary edema. No pleural effusion. No pneumothorax. No acute osseous abnormality. IMPRESSION: No active disease. Electronically Signed   By: Iven Finn M.D.   On: 06/26/2020 16:41   DG C-Arm 1-60 Min  Result Date: 06/27/2020 CLINICAL DATA:  ORIF comminuted trimalleolar ankle fracture. EXAM: RIGHT ANKLE - 2 VIEW; DG C-ARM 1-60 MIN COMPARISON:  Right ankle radiographs-06/26/2020 FLUOROSCOPY TIME:  45 seconds FINDINGS: 9 spot intraoperative fluoroscopic images of the right ankle are provided for review. Images demonstrate on going sideplate fixation of the distal fibula and medial malleolus. Alignment appears anatomic. Expected overlying skin staples and scattered foci of subcutaneous emphysema. No radiopaque foreign body. IMPRESSION: Post ORIF of trimalleolar ankle fracture without evidence of complication. Electronically Signed   By: Sandi Mariscal M.D.   On: 06/27/2020 13:26    Blood pressure 105/64, pulse 73, temperature 97.7 F (36.5 C), temperature source Oral, resp. rate 16, height 5\' 3"  (1.6 m), weight 68.9 kg, SpO2 96 %.  Assessment 1. Right unstable bimalleolar ankle fracture closed displaced status post open reduction with internal fixation 2. Left ankle sprain/bone contusion 3. Pes cavus foot type  Plan -Patient seen and examined.  -Right ankle appears to be stable at this time and dressings clean, dry, and intact.  Patient is to leave this intact for the next week until follow-up appointment in clinic. -Patient appears to have some new onset left ankle pain which likely occurred when patient fell as well and has some bruising about the medial ankle. -X-ray ordered of the left ankle to assess the  bony area further.  Do not suspect any further fracture based on examination today.  If no fracture or dislocation is noted no further treatment is indicated to this area unless patient is having some instability/pain.  If that is the case then patient should obtain an ASO ankle brace for the left ankle upon discharge. -Patient is to remain nonweightbearing to the right lower extremity all times for the next 6 to 8 weeks until the bone is healed. -Starting patient on prophylactic doxycycline 100 mg twice daily for the next 7 days.  Order placed in chart. -Patient to continue with TED hose on left lower extremity.  Also ordered patient to start DVT prophylaxis.  Lovenox 40 mg daily for likely the next 6 weeks until patient is ambulatory.  Appreciate medicine recommendations of patient like to change to a different blood thinner upon discharge. -Appreciate PT and occupational therapy recommendations.  Patient will need skilled nursing facility placement.  Podiatry team to sign off on patient at this time.  Will assess patient again if imaging reveals any concerns on the left ankle.  Please reconsult if any further problems arise.   Caroline More, DPM 06/28/2020, 2:31 PM

## 2020-06-29 ENCOUNTER — Encounter: Payer: Self-pay | Admitting: Podiatry

## 2020-06-29 MED ORDER — IBUPROFEN 400 MG PO TABS
400.0000 mg | ORAL_TABLET | Freq: Once | ORAL | Status: AC
  Administered 2020-06-29: 400 mg via ORAL
  Filled 2020-06-29: qty 1

## 2020-06-29 NOTE — Progress Notes (Signed)
PROGRESS NOTE    Ariel Phillips  VZD:[id number] DOB: 23-Jul-1932 DOA: 06/26/2020 PCP: Venia Carbon, MD    Brief Narrative: The patient is 87 years old female with PMH significant for hypothyroidism, seasonal allergies, GERD presents in the emergency department with complaint of right ankle pain.  Patient reports that she tripped and fell over an elevation in the concrete.  She denies any numbness,  tingling of the lower extremity.  X-ray foot shows right ankle fracture.  Podiatry is consulted,  Patient underwent right ankle bimalleolar ORIF.  Postoperative day 2.  Reports pain is improved.  PT and OT evaluation recommended SNF ,  patient will be nonweightbearing for 6 to 8 weeks.  Assessment & Plan:   Principal Problem:   Ankle fracture Active Problems:   IBS (irritable bowel syndrome)   Hypothyroidism   Displaced trimalleolar Right ankle fracture secondary to mechanical fall : Patient presented with fall and has right ankle fracture. Adequate pain control with morphine 2 mg IV every 4 hours for severe pain,  Continue ketorolac 50 mg IV every 6 hours for moderate pain. Podiatry has been consulted.  Patient underwent bimalleolar ORIF  2/26. -Today's postoperative day 2,  patient reports pain is improved. Patient will be nonweightbearing for 4 to 6 weeks post procedure. -PT, OT recommended SNF. Patient will be on Lovenox for 6 to 8 weeks for DVT prophylaxis until ambulatory. Patient is started on prophylactic doxycycline for infection after surgery for 7 days.  Hypothyroidism -Continue levothyroxine 75 mcg in the morning.  IBS -Continue rifaximin 550 mg twice daily.   DVT prophylaxis: Lovenox  Code Status: Full code. Family Communication:  No family at bed side. Disposition Plan:  Status is: Inpatient  Remains inpatient appropriate because:Inpatient level of care appropriate due to severity of illness   Dispo: The patient is from: Home              Anticipated  d/c is to: SNF              Patient currently is not medically stable to d/c.   Difficult to place patient No   Consultants:   Orthopeadics  Procedures: Right ankle bimalleolar ORIF  Antimicrobials:   Anti-infectives (From admission, onward)   Start     Dose/Rate Route Frequency Ordered Stop   06/28/20 1530  doxycycline (VIBRA-TABS) tablet 100 mg        100 mg Oral Every 12 hours 06/28/20 1431 07/05/20 0959   06/27/20 0600  clindamycin (CLEOCIN) IVPB 900 mg        900 mg 100 mL/hr over 30 Minutes Intravenous On call to O.R. 06/26/20 2046 06/27/20 1007   06/26/20 2200  rifaximin (XIFAXAN) tablet 550 mg        550 mg Oral 2 times daily 06/26/20 1818     06/26/20 1600  ceFAZolin (ANCEF) IVPB 1 g/50 mL premix        1 g 100 mL/hr over 30 Minutes Intravenous  Once 06/26/20 1553 06/26/20 1905     Subjective: Patient was seen and examined at bedside.  Overnight events noted.   Patient is s/p bimalleolar right ORIF, postoperative day 2. She reports pain is better controlled with pain medications. She reports having left knee pain yesterday during OT session.  x-ray was unremarkable.  Explained in detail.  Objective: Vitals:   06/29/20 0018 06/29/20 0448 06/29/20 0745 06/29/20 1150  BP: (!) 106/55 125/68 (!) 145/76 135/73  Pulse: 76 77 85 88  Resp: 16  17 20 19   Temp: 98.4 F (36.9 C) 98.4 F (36.9 C) (!) 97.5 F (36.4 C) 97.9 F (36.6 C)  TempSrc:      SpO2: 95% 95% 95% 95%  Weight:      Height:        Intake/Output Summary (Last 24 hours) at 06/29/2020 1258 Last data filed at 06/29/2020 0441 Gross per 24 hour  Intake --  Output 1050 ml  Net -1050 ml   Filed Weights   06/26/20 1530  Weight: 68.9 kg    Examination:  General exam: Appears calm and comfortable, not in any acute distress. Respiratory system: Clear to auscultation. Respiratory effort normal. Cardiovascular system: S1 & S2 heard, RRR. No JVD, murmurs, rubs, gallops or clicks. No pedal  edema. Gastrointestinal system: Abdomen is nondistended, soft and nontender. No organomegaly or masses felt. Normal bowel sounds heard. Central nervous system: Alert and oriented. No focal neurological deficits. Extremities: Symmetric 5 x 5 power.  Right foot pain immobilizer. Skin: No rashes, lesions or ulcers Psychiatry: Judgement and insight appear normal. Mood & affect appropriate.     Data Reviewed: I have personally reviewed following labs and imaging studies  CBC: Recent Labs  Lab 06/26/20 1519 06/27/20 0509 06/28/20 0527  WBC 5.6 10.4  --   NEUTROABS 3.5  --   --   HGB 14.1 14.1 12.6  HCT 42.7 42.9 38.3  MCV 94.3 93.5  --   PLT 241 200  --    Basic Metabolic Panel: Recent Labs  Lab 06/26/20 1519 06/27/20 0509 06/28/20 0527  NA 137 138 138  K 4.5 3.5 4.0  CL 106 107 107  CO2 25 25 25   GLUCOSE 102* 123* 117*  BUN 21 16 18   CREATININE 0.60 0.56 0.65  CALCIUM 9.1 8.5* 8.2*   GFR: Estimated Creatinine Clearance: 47.9 mL/min (by C-G formula based on SCr of 0.65 mg/dL). Liver Function Tests: Recent Labs  Lab 06/26/20 1519  AST 22  ALT 14  ALKPHOS 81  BILITOT 0.7  PROT 7.3  ALBUMIN 3.9   No results for input(s): LIPASE, AMYLASE in the last 168 hours. No results for input(s): AMMONIA in the last 168 hours. Coagulation Profile: No results for input(s): INR, PROTIME in the last 168 hours. Cardiac Enzymes: No results for input(s): CKTOTAL, CKMB, CKMBINDEX, TROPONINI in the last 168 hours. BNP (last 3 results) No results for input(s): PROBNP in the last 8760 hours. HbA1C: No results for input(s): HGBA1C in the last 72 hours. CBG: No results for input(s): GLUCAP in the last 168 hours. Lipid Profile: No results for input(s): CHOL, HDL, LDLCALC, TRIG, CHOLHDL, LDLDIRECT in the last 72 hours. Thyroid Function Tests: No results for input(s): TSH, T4TOTAL, FREET4, T3FREE, THYROIDAB in the last 72 hours. Anemia Panel: No results for input(s): VITAMINB12,  FOLATE, FERRITIN, TIBC, IRON, RETICCTPCT in the last 72 hours. Sepsis Labs: No results for input(s): PROCALCITON, LATICACIDVEN in the last 168 hours.  Recent Results (from the past 240 hour(s))  SARS CORONAVIRUS 2 (TAT 6-24 HRS) Nasopharyngeal Nasopharyngeal Swab     Status: None   Collection Time: 06/26/20  5:03 PM   Specimen: Nasopharyngeal Swab  Result Value Ref Range Status   SARS Coronavirus 2 NEGATIVE NEGATIVE Final    Comment: (NOTE) SARS-CoV-2 target nucleic acids are NOT DETECTED.  The SARS-CoV-2 RNA is generally detectable in upper and lower respiratory specimens during the acute phase of infection. Negative results do not preclude SARS-CoV-2 infection, do not rule out co-infections with other  pathogens, and should not be used as the sole basis for treatment or other patient management decisions. Negative results must be combined with clinical observations, patient history, and epidemiological information. The expected result is Negative.  Fact Sheet for Patients: SugarRoll.be  Fact Sheet for Healthcare Providers: https://www.woods-mathews.com/  This test is not yet approved or cleared by the [place] FDA and  has been authorized for detection and/or diagnosis of SARS-CoV-2 by FDA under an Emergency Use Authorization (EUA). This EUA will remain  in effect (meaning this test can be used) for the duration of the COVID-19 declaration under Se ction 564(b)(1) of the Act, 21 U.S.C. section 360bbb-3(b)(1), unless the authorization is terminated or revoked sooner.  Performed at [facility], [place] [address], [place],  [address]     Radiology Studies: DG Ankle Complete Right  Result Date: 06/27/2020 CLINICAL DATA:  ORIF right ankle fracture EXAM: RIGHT ANKLE - COMPLETE 3+ VIEW COMPARISON:  06/26/2020 FINDINGS: Post sideplate fixation of obliquely oriented distal fibular fracture and comminuted fracture of the medial  malleolus. There is no definitive dedicated fixation of the posterior malleolar fracture however alignment appears anatomic. Interval restoration of the ankle mortise. Expected adjacent soft tissue swelling, scattered foci of subcutaneous emphysema and overlying skin staples. No radiopaque foreign body. IMPRESSION: Post sideplate fixation of distal fibular fracture and comminuted fracture of the medial malleolus, now with anatomic alignment Electronically Signed   By: Sandi Mariscal M.D.   On: 06/27/2020 13:22   DG Knee Complete 4 Views Left  Result Date: 06/28/2020 CLINICAL DATA:  Left knee pain after fall. EXAM: LEFT KNEE - COMPLETE 4+ VIEW COMPARISON:  February 27, 2013. FINDINGS: No evidence of fracture, dislocation, or joint effusion. No evidence of arthropathy or other focal bone abnormality. Soft tissues are unremarkable. IMPRESSION: Negative. Electronically Signed   By: Marijo Conception M.D.   On: 06/28/2020 20:14   Scheduled Meds: . doxycycline  100 mg Oral Q12H  . enoxaparin (LOVENOX) injection  40 mg Subcutaneous Q24H  . levothyroxine  75 mcg Oral Q0600  . pantoprazole  40 mg Oral Daily  . rifaximin  550 mg Oral BID   Continuous Infusions:   LOS: 3 days    Time spent: 25 mins    Shawna Clamp, MD [facility]   If 7PM-7AM, please contact night-coverage

## 2020-06-29 NOTE — TOC Initial Note (Signed)
Transition of Care [facility]) - Initial/Assessment Note    Patient Details  Name: Ariel Phillips MRN: [id number] Date of Birth: [birth year]  [facility] of [facility] CM/SW Contact:    Shelbie Ammons, RN Phone Number: 06/29/2020, 11:11 AM  Clinical Narrative:  RNCM met with patient in room. Patient sitting up in recliner reports to feeling ok today. Patient lives in the independent apartments at [facility] and reports it is her plan to return to their SNF at discharge.  RNCM completed PASSR, FL2 and sent bed request to [facility].               Expected Discharge Plan: Skilled Nursing Facility Barriers to Discharge: No Barriers Identified   Patient Goals and CMS Choice        Expected Discharge Plan and Services Expected Discharge Plan: [place] Acute Care Choice: [place] Living arrangements for the past 2 months: Apartment                                      Prior Living Arrangements/Services Living arrangements for the past 2 months: Apartment Lives with:: Self Patient language and need for interpreter reviewed:: Yes Do you feel safe going back to the place where you live?: Yes      Need for Family Participation in Patient Care: Yes (Comment) Care giver support system in place?: Yes (comment)   Criminal Activity/Legal Involvement Pertinent to Current Situation/Hospitalization: No - Comment as needed  Activities of Daily Living      Permission Sought/Granted                  Emotional Assessment Appearance:: Appears stated age Attitude/Demeanor/Rapport: Engaged Affect (typically observed): Appropriate,Calm Orientation: : Oriented to Self,Oriented to Place,Oriented to  Time,Oriented to Situation Alcohol / Substance Use: Not Applicable Psych Involvement: No (comment)  Admission diagnosis:  Ankle fracture [A35.573U] Patient Active Problem List   Diagnosis Date Noted  . Ankle fracture 06/26/2020  . Contusion  of right ring finger 03/04/2020  . Coronary atherosclerosis of native coronary artery 02/04/2020  . Osteoarthritis of [place] joint of thumb 02/04/2020  . Choledocholithiasis   . Aortic atherosclerosis ([place]) 01/14/2019  . Stiff neck 05/11/2018  . High arches 05/11/2018  . Allergic rhinitis 08/22/2017  . Mood disorder ([place]) 12/30/2016  . MCI (mild cognitive impairment) 12/02/2015  . Benign essential tremor 07/20/2015  . Advanced directives, counseling/discussion 12/19/2013  . Routine general medical examination at a health care facility 12/13/2012  . Hypothyroidism 12/12/2011  . Allergic rhinitis due to pollen   . Allergic asthma   . GERD (gastroesophageal reflux disease)   . IBS (irritable bowel syndrome)   . Osteopenia    PCP:  Venia Carbon, MD Pharmacy:   [facility] [place] [address] Phone: [phone] Fax: [phone]  [place], [place] [address] Phone: [phone] Fax: [phone]  [facility] [place], [place] [facility] [address] [place] [address] Phone: [phone] Fax: [phone]     Social Determinants of Health (SDOH) Interventions    Readmission Risk Interventions No flowsheet data found.

## 2020-06-29 NOTE — Progress Notes (Signed)
Physical Therapy Treatment Patient Details Name: Ariel Phillips MRN: [id number] DOB: 29-Mar-1933 Today's Date: 06/29/2020    History of Present Illness 87 yo F who came to [facility] on 2/25 c Rt ankle pain after [place] and found to have R fx now s/p R ankle ORIF (2/26). Denies hitting head. PMH: GERD, IBS, osteopenia, hypoTSH, CAD. Pt currently resides at [facility].    PT Comments    Pt agreeable to PT tx. Provided pt with LE HEP handout & pt performs exercises as noted below (pt noted to have impaired memory as she did not recall performing some exercises yesterday). Pt still unable to stand with use of RW & PT assist 2/2 L knee pain but is able to complete lateral scoot bed>drop arm recliner with CGA. Will continue to see pt to progress transfers & gait as able.     Follow Up Recommendations  SNF;Supervision for mobility/OOB     Equipment Recommendations       Recommendations for Other Services       Precautions / Restrictions Precautions Precautions: Fall Restrictions Weight Bearing Restrictions: Yes RLE Weight Bearing: Non weight bearing    Mobility  Bed Mobility Overal bed mobility: Modified Independent             General bed mobility comments: supine<>sit with HOB elevated without assistance    Transfers Overall transfer level: Needs assistance Equipment used: Rolling walker (2 wheeled) Transfers: Lateral/Scoot Transfers;Sit to/from Stand Sit to Stand: Max assist (3 attempts for sit<>stand with pt unable to achieve full upright standing 2/2 LLE weakness & pain in knee as well as difficulty transitioning RUE from pushing EOB to RW)        Lateral/Scoot Transfers: Min guard (bed>drop arm recliner with cuing for weight bearing through LLE only during transfer)    Ambulation/Gait                 Stairs             Wheelchair Mobility    Modified Rankin (Stroke Patients Only)       Balance Overall balance assessment:  Needs assistance Sitting-balance support: No upper extremity supported;Feet supported Sitting balance-Leahy Scale: Good                                      Cognition Arousal/Alertness: Awake/alert Behavior During Therapy: WFL for tasks assessed/performed Overall Cognitive Status: Within Functional Limits for tasks assessed                                 General Comments: Agreable and pleasant throughout. Expressed concerns regarding adhering to NWB precaution during functional tasks.      Exercises General Exercises - Lower Extremity Ankle Circles/Pumps: AROM;Strengthening;Left;10 reps;Supine Quad Sets: AROM;Strengthening;Right;Left;10 reps;Supine Gluteal Sets: AROM;Strengthening;Both;10 reps;Supine Short Arc Quad: AROM;Strengthening;Right;Left;10 reps;Supine Long Arc Quad: AROM;Strengthening;Right;Left;10 reps;Seated Heel Slides: AROM;Strengthening;Right;Left;10 reps;Supine Hip ABduction/ADduction: AROM;Strengthening;Right;Left;Both;10 reps;Supine (hip adduction pillow squeezes x 10 and hip abduction x 10) Straight Leg Raises: AROM;Strengthening;Right;Left;10 reps;Supine Hip Flexion/Marching: AROM;Strengthening;Right;Left;10 reps;Seated    General Comments        Pertinent Vitals/Pain Pain Assessment: Faces Faces Pain Scale: Hurts even more Pain Location: L knee Pain Descriptors / Indicators: Discomfort;Grimacing Pain Intervention(s): Monitored during session    Home Living  Prior Function            PT Goals (current goals can now be found in the care plan section) Acute Rehab PT Goals Patient Stated Goal: to be able to take care of my cats PT Goal Formulation: With patient Time For Goal Achievement: 07/12/20 Potential to Achieve Goals: Good Progress towards PT goals: Progressing toward goals    Frequency    BID      PT Plan Current plan remains appropriate    Co-evaluation               AM-PAC PT "6 Clicks" Mobility   Outcome Measure  Help needed turning from your back to your side while in a flat bed without using bedrails?: None Help needed moving from lying on your back to sitting on the side of a flat bed without using bedrails?: None Help needed moving to and from a bed to a chair (including a wheelchair)?: A Little Help needed standing up from a chair using your arms (e.g., wheelchair or bedside chair)?: Total Help needed to walk in hospital room?: Total Help needed climbing 3-5 steps with a railing? : Total 6 Click Score: 14    End of Session Equipment Utilized During Treatment: Gait belt Activity Tolerance: Patient limited by pain Patient left: in chair;with call bell/phone within reach;with chair alarm set   PT Visit Diagnosis: Muscle weakness (generalized) (M62.81);Difficulty in walking, not elsewhere classified (R26.2);Pain Pain - Right/Left: Left Pain - part of body: Knee     Time: 2081-3887 PT Time Calculation (min) (ACUTE ONLY): 25 min  Charges:  $Therapeutic Exercise: 8-22 mins $Therapeutic Activity: 8-22 mins                    Ariel Phillips, PT, DPT 06/29/20, 10:59 AM    Waunita Schooner 06/29/2020, 10:58 AM

## 2020-06-29 NOTE — Progress Notes (Signed)
PIV consult, patient not receiving any PIV medication at this time. Discussed with Natale Milch, RN that best practice would be to not place PIV at this time. Will continue to monitor.

## 2020-06-29 NOTE — NC FL2 (Signed)
[place] LEVEL OF CARE SCREENING TOOL     IDENTIFICATION  Patient Name: Ariel Phillips Birthdate: March 15, 1933 Sex: female Admission Date (Current Location): 06/26/2020  [place] and [place] Number:  Engineering geologist and Address:  [facility], [address] [place], [place] [address]      Provider Number: [id number]  Attending Physician Name and Address:  Shawna Clamp, MD  Relative Name and Phone Number:  Harold Barban [phone]    Current Level of Care: Hospital Recommended Level of Care: [place] Prior Approval Number:    Date Approved/Denied:   PASRR Number: [id number]  Discharge Plan: SNF    Current Diagnoses: Patient Active Problem List   Diagnosis Date Noted  . Ankle fracture 06/26/2020  . Contusion of right ring finger 03/04/2020  . Coronary atherosclerosis of native coronary artery 02/04/2020  . Osteoarthritis of [place] joint of thumb 02/04/2020  . Choledocholithiasis   . Aortic atherosclerosis ([place]) 01/14/2019  . Stiff neck 05/11/2018  . High arches 05/11/2018  . Allergic rhinitis 08/22/2017  . Mood disorder ([place]) 12/30/2016  . MCI (mild cognitive impairment) 12/02/2015  . Benign essential tremor 07/20/2015  . Advanced directives, counseling/discussion 12/19/2013  . Routine general medical examination at a health care facility 12/13/2012  . Hypothyroidism 12/12/2011  . Allergic rhinitis due to pollen   . Allergic asthma   . GERD (gastroesophageal reflux disease)   . IBS (irritable bowel syndrome)   . Osteopenia     Orientation RESPIRATION BLADDER Height & Weight     Self,Time,Situation,Place  Normal External catheter Weight: 68.9 kg Height:  5\' 3"  (160 cm)  BEHAVIORAL SYMPTOMS/MOOD NEUROLOGICAL BOWEL NUTRITION STATUS      Continent Diet (Regular)  AMBULATORY STATUS COMMUNICATION OF NEEDS Skin   Extensive Assist Non-Verbally Surgical wounds                       Personal Care  Assistance Level of Assistance  Dressing,Feeding,Bathing Bathing Assistance: Limited assistance Feeding assistance: Independent Dressing Assistance: Limited assistance     Functional Limitations Info             SPECIAL CARE FACTORS FREQUENCY  PT (By licensed PT),OT (By licensed OT)                    Contractures Contractures Info: Not present    Additional Factors Info  Code Status,Allergies Code Status Info: DNR Allergies Info: Penicillins, Latex           Current Medications (06/29/2020):  This is the current hospital active medication list Current Facility-Administered Medications  Medication Dose Route Frequency Provider Last Rate Last Admin  . acetaminophen (TYLENOL) tablet 325 mg  325 mg Oral Q6H PRN Caroline More, DPM   325 mg at 06/29/20 6294   Or  . acetaminophen (TYLENOL) suppository 325 mg  325 mg Rectal Q6H PRN Caroline More, DPM      . doxycycline (VIBRA-TABS) tablet 100 mg  100 mg Oral Q12H Caroline More, DPM   100 mg at 06/29/20 1011  . enoxaparin (LOVENOX) injection 40 mg  40 mg Subcutaneous Q24H Caroline More, DPM   40 mg at 06/28/20 2136  . levothyroxine (SYNTHROID) tablet 75 mcg  75 mcg Oral Q0600 Caroline More, DPM   75 mcg at 06/29/20 7654  . metoprolol tartrate (LOPRESSOR) injection 5 mg  5 mg Intravenous Q4H PRN Caroline More, DPM      . morphine 2 MG/ML injection 2  mg  2 mg Intravenous Q4H PRN Caroline More, DPM   2 mg at 06/27/20 0427  . ondansetron (ZOFRAN) tablet 4 mg  4 mg Oral Q6H PRN Caroline More, DPM       Or  . ondansetron (ZOFRAN) injection 4 mg  4 mg Intravenous Q6H PRN Caroline More, DPM   4 mg at 06/27/20 1221  . pantoprazole (PROTONIX) EC tablet 40 mg  40 mg Oral Daily Caroline More, DPM   40 mg at 06/29/20 1011  . rifaximin (XIFAXAN) tablet 550 mg  550 mg Oral BID Caroline More, DPM         Discharge Medications: Please see discharge summary for a list of discharge medications.  Relevant Imaging Results:  Relevant Lab  Results:   Additional Information SS# [id number]  Shelbie Ammons, RN

## 2020-06-29 NOTE — Progress Notes (Signed)
Physical Therapy Treatment Patient Details Name: Ariel Phillips MRN: [id number] DOB: 04-30-1933 Today's Date: 06/29/2020    History of Present Illness 87 yo F who came to [facility] on 2/25 c Rt ankle pain [place] and found to have R fx now s/p R ankle ORIF (2/26). Denies hitting head. PMH: GERD, IBS, osteopenia, hypoTSH, CAD. Pt currently resides at [place].    PT Comments    Pt demonstrates impaired memory as she doesn't recall HEP handout nor exercises from this morning with PT reviewing handout. Pt attempts sit>stand from various surfaces but is still unable to stand despite max assist 2/2 L knee pain. Pt is able to complete lateral scoot drop arm recliner>bed with CGA. Continue to recommend STR upon d/c to maximize independence & safety with functional mobility prior to return home.    Follow Up Recommendations  SNF;Supervision for mobility/OOB     Equipment Recommendations   (TBD in next venue)    Recommendations for Other Services       Precautions / Restrictions Precautions Precautions: Fall Restrictions Weight Bearing Restrictions: Yes RLE Weight Bearing: Non weight bearing    Mobility  Bed Mobility Overal bed mobility: Modified Independent             General bed mobility comments: sit>supine with HOB elevated    Transfers Overall transfer level: Needs assistance Equipment used: Rolling walker (2 wheeled) Transfers: Lateral/Scoot Transfers;Sit to/from Stand Sit to Stand: Max assist (Pt attempts sit>stand from recliner x 3 and sit>stand from elevated EOB x 1 with pt improving in her ability to stand more upright but unable to fully extend L knee & unable to transfer LUE from pushing to RW despite max assist.)        Lateral/Scoot Transfers: Min guard General transfer comment: drop arm recliner>bed with cuing for technique  Ambulation/Gait                 Stairs             Wheelchair Mobility    Modified  [place] (Stroke Patients Only)       Balance Overall balance assessment: Needs assistance Sitting-balance support: No upper extremity supported;Feet supported Sitting balance-Leahy Scale: Good Sitting balance - Comments: Sitting EOB with supervision     Standing balance-Leahy Scale: Zero                              Cognition Arousal/Alertness: Awake/alert Behavior During Therapy: WFL for tasks assessed/performed Overall Cognitive Status: Within Functional Limits for tasks assessed                                 General Comments: Poor memory - pt does not recall exercises nor handout PT gave her from this morning.      Exercises      General Comments General comments (skin integrity, edema, etc.): Pt states "I'm scared" but does elaborate over what & PT provides encouragement/education re: situation      Pertinent Vitals/Pain Pain Assessment: Faces Faces Pain Scale: Hurts little more Pain Location: L knee Pain Descriptors / Indicators: Discomfort;Grimacing Pain Intervention(s): Repositioned;Monitored during session;Limited activity within patient's tolerance    Home Living                      Prior Function  PT Goals (current goals can now be found in the care plan section) Acute Rehab PT Goals Patient Stated Goal: to be able to take care of my cats PT Goal Formulation: With patient Time For Goal Achievement: 07/12/20 Potential to Achieve Goals: Good Progress towards PT goals: Progressing toward goals    Frequency    BID      PT Plan Current plan remains appropriate    Co-evaluation              AM-PAC PT "6 Clicks" Mobility   Outcome Measure  Help needed turning from your back to your side while in a flat bed without using bedrails?: None Help needed moving from lying on your back to sitting on the side of a flat bed without using bedrails?: None Help needed moving to and from a bed to a chair  (including a wheelchair)?: A Little Help needed standing up from a chair using your arms (e.g., wheelchair or bedside chair)?: Total Help needed to walk in hospital room?: Total Help needed climbing 3-5 steps with a railing? : Total 6 Click Score: 14    End of Session Equipment Utilized During Treatment: Gait belt Activity Tolerance: Patient limited by pain Patient left: in bed;with call bell/phone within reach;with bed alarm set   PT Visit Diagnosis: Muscle weakness (generalized) (M62.81);Difficulty in walking, not elsewhere classified (R26.2);Pain Pain - Right/Left: Left Pain - part of body: Knee     Time: 6789-3810 PT Time Calculation (min) (ACUTE ONLY): 12 min  Charges:  $Therapeutic Exercise: 8-22 mins $Therapeutic Activity: 8-22 mins                     Lavone Nian, PT, DPT 06/29/20, 2:28 PM    Waunita Schooner 06/29/2020, 2:27 PM

## 2020-06-30 DIAGNOSIS — K2100 Gastro-esophageal reflux disease with esophagitis, without bleeding: Secondary | ICD-10-CM | POA: Diagnosis not present

## 2020-06-30 DIAGNOSIS — Z9049 Acquired absence of other specified parts of digestive tract: Secondary | ICD-10-CM | POA: Diagnosis not present

## 2020-06-30 DIAGNOSIS — I700 Atherosclerosis of aorta: Secondary | ICD-10-CM | POA: Diagnosis not present

## 2020-06-30 DIAGNOSIS — K589 Irritable bowel syndrome without diarrhea: Secondary | ICD-10-CM | POA: Diagnosis not present

## 2020-06-30 DIAGNOSIS — M1810 Unilateral primary osteoarthritis of first carpometacarpal joint, unspecified hand: Secondary | ICD-10-CM | POA: Diagnosis not present

## 2020-06-30 DIAGNOSIS — J302 Other seasonal allergic rhinitis: Secondary | ICD-10-CM | POA: Diagnosis not present

## 2020-06-30 DIAGNOSIS — Z23 Encounter for immunization: Secondary | ICD-10-CM | POA: Diagnosis not present

## 2020-06-30 DIAGNOSIS — M25571 Pain in right ankle and joints of right foot: Secondary | ICD-10-CM | POA: Diagnosis not present

## 2020-06-30 DIAGNOSIS — W1839XD Other fall on same level, subsequent encounter: Secondary | ICD-10-CM | POA: Diagnosis not present

## 2020-06-30 DIAGNOSIS — Q6672 Congenital pes cavus, left foot: Secondary | ICD-10-CM | POA: Diagnosis not present

## 2020-06-30 DIAGNOSIS — Z7982 Long term (current) use of aspirin: Secondary | ICD-10-CM | POA: Diagnosis not present

## 2020-06-30 DIAGNOSIS — E039 Hypothyroidism, unspecified: Secondary | ICD-10-CM | POA: Diagnosis not present

## 2020-06-30 DIAGNOSIS — Z85828 Personal history of other malignant neoplasm of skin: Secondary | ICD-10-CM | POA: Diagnosis not present

## 2020-06-30 DIAGNOSIS — M84471A Pathological fracture, right ankle, initial encounter for fracture: Secondary | ICD-10-CM | POA: Diagnosis not present

## 2020-06-30 DIAGNOSIS — Q6671 Congenital pes cavus, right foot: Secondary | ICD-10-CM | POA: Diagnosis not present

## 2020-06-30 DIAGNOSIS — R262 Difficulty in walking, not elsewhere classified: Secondary | ICD-10-CM | POA: Diagnosis not present

## 2020-06-30 DIAGNOSIS — R279 Unspecified lack of coordination: Secondary | ICD-10-CM | POA: Diagnosis not present

## 2020-06-30 DIAGNOSIS — S82851A Displaced trimalleolar fracture of right lower leg, initial encounter for closed fracture: Secondary | ICD-10-CM | POA: Diagnosis not present

## 2020-06-30 DIAGNOSIS — S93491A Sprain of other ligament of right ankle, initial encounter: Secondary | ICD-10-CM | POA: Diagnosis not present

## 2020-06-30 DIAGNOSIS — Z66 Do not resuscitate: Secondary | ICD-10-CM | POA: Diagnosis not present

## 2020-06-30 DIAGNOSIS — J301 Allergic rhinitis due to pollen: Secondary | ICD-10-CM | POA: Diagnosis not present

## 2020-06-30 DIAGNOSIS — Z79899 Other long term (current) drug therapy: Secondary | ICD-10-CM | POA: Diagnosis not present

## 2020-06-30 DIAGNOSIS — R278 Other lack of coordination: Secondary | ICD-10-CM | POA: Diagnosis not present

## 2020-06-30 DIAGNOSIS — M6281 Muscle weakness (generalized): Secondary | ICD-10-CM | POA: Diagnosis not present

## 2020-06-30 DIAGNOSIS — M25562 Pain in left knee: Secondary | ICD-10-CM | POA: Diagnosis not present

## 2020-06-30 DIAGNOSIS — K580 Irritable bowel syndrome with diarrhea: Secondary | ICD-10-CM | POA: Diagnosis not present

## 2020-06-30 DIAGNOSIS — G3184 Mild cognitive impairment, so stated: Secondary | ICD-10-CM | POA: Diagnosis not present

## 2020-06-30 DIAGNOSIS — R4189 Other symptoms and signs involving cognitive functions and awareness: Secondary | ICD-10-CM | POA: Diagnosis not present

## 2020-06-30 DIAGNOSIS — F39 Unspecified mood [affective] disorder: Secondary | ICD-10-CM | POA: Diagnosis not present

## 2020-06-30 DIAGNOSIS — Z741 Need for assistance with personal care: Secondary | ICD-10-CM | POA: Diagnosis not present

## 2020-06-30 DIAGNOSIS — M8580 Other specified disorders of bone density and structure, unspecified site: Secondary | ICD-10-CM | POA: Diagnosis not present

## 2020-06-30 DIAGNOSIS — R5381 Other malaise: Secondary | ICD-10-CM | POA: Diagnosis not present

## 2020-06-30 DIAGNOSIS — Z79890 Hormone replacement therapy (postmenopausal): Secondary | ICD-10-CM | POA: Diagnosis not present

## 2020-06-30 DIAGNOSIS — W010XXA Fall on same level from slipping, tripping and stumbling without subsequent striking against object, initial encounter: Secondary | ICD-10-CM | POA: Diagnosis not present

## 2020-06-30 DIAGNOSIS — Z20822 Contact with and (suspected) exposure to covid-19: Secondary | ICD-10-CM | POA: Diagnosis not present

## 2020-06-30 DIAGNOSIS — I2510 Atherosclerotic heart disease of native coronary artery without angina pectoris: Secondary | ICD-10-CM | POA: Diagnosis not present

## 2020-06-30 DIAGNOSIS — Z91040 Latex allergy status: Secondary | ICD-10-CM | POA: Diagnosis not present

## 2020-06-30 DIAGNOSIS — S82851D Displaced trimalleolar fracture of right lower leg, subsequent encounter for closed fracture with routine healing: Secondary | ICD-10-CM | POA: Diagnosis not present

## 2020-06-30 DIAGNOSIS — S82841D Displaced bimalleolar fracture of right lower leg, subsequent encounter for closed fracture with routine healing: Secondary | ICD-10-CM | POA: Diagnosis not present

## 2020-06-30 DIAGNOSIS — K219 Gastro-esophageal reflux disease without esophagitis: Secondary | ICD-10-CM | POA: Diagnosis not present

## 2020-06-30 DIAGNOSIS — Z880 Allergy status to penicillin: Secondary | ICD-10-CM | POA: Diagnosis not present

## 2020-06-30 DIAGNOSIS — M542 Cervicalgia: Secondary | ICD-10-CM | POA: Diagnosis not present

## 2020-06-30 DIAGNOSIS — S82891A Other fracture of right lower leg, initial encounter for closed fracture: Secondary | ICD-10-CM | POA: Diagnosis not present

## 2020-06-30 LAB — RESP PANEL BY RT-PCR (FLU A&B, COVID) ARPGX2
Influenza A by PCR: NEGATIVE
Influenza B by PCR: NEGATIVE
SARS Coronavirus 2 by RT PCR: NEGATIVE

## 2020-06-30 MED ORDER — DOXYCYCLINE HYCLATE 100 MG PO TABS: 100.0000 mg | ORAL_TABLET | Freq: Two times a day (BID) | ORAL | 0 refills | Status: DC

## 2020-06-30 MED ORDER — ENOXAPARIN SODIUM 40 MG/0.4ML ~~LOC~~ SOLN: 40.0000 mg | SUBCUTANEOUS | 0 refills | Status: DC

## 2020-06-30 NOTE — Care Management Important Message (Signed)
Important Message  Patient Details  Name: Ariel Phillips MRN: [id number] Date of Birth: 11-04-1932   Medicare Important Message Given:  N/A - LOS <3 / Initial given by admissions     Juliann Pulse A Trinity Haun 06/30/2020, 7:47 AM

## 2020-06-30 NOTE — Discharge Instructions (Signed)
Advised to follow-up with podiatry Dr. Luana Shu in 1 week. Advised to take Lovenox 40 mg subcu daily for 40 days for post operative DVT prophylaxis  Advised to take doxycycline 100 mg twice daily for 5 days for postoperative infection prophylaxis.  Patient would be nonweightbearing on the right lower extremity for 6 to 8 weeks.    [place]  POST OPERATIVE INSTRUCTIONS FOR DR. [place]   1. Take your medication as prescribed.  Pain medication should be taken only as needed.  2. Keep the dressing clean, dry and intact.  Do not put any weight on the right lower extremity.  3. Take antibiotic medication as prescribed until gone.  Take DVT prophylaxis medication as prescribed until instructed to discontinue.  We will likely have to continue with DVT prophylaxis measures until ambulatory in about 6 weeks..  4. Keep your foot elevated above the heart level to control swelling of the right ankle.  Try to do this for the most part at all times to reduce swelling.  5. Do not take a shower. Baths are permissible as long as the foot is kept out of the water.   6. Every hour you are awake:  - Bend your knee 15 times. - Massage calf 15 times  7. Call [facility] [phone]) if any of the following problems occur: - You develop a temperature or fever. - The bandage becomes saturated with blood. - Medication does not stop your pain. - Injury of the foot occurs. - Any symptoms of infection including redness, odor, or red streaks running from wound.

## 2020-06-30 NOTE — Plan of Care (Signed)
Pt D/C home per MD order, D/C instructions reviewed with pt, all questions answered. Pt aware of follow up appt., Pt verbalized understanding of discharged instructions. Report call to the receiving nurse at the facility [facility].   Problem: Clinical Measurements: Goal: Will remain free from infection Outcome: Adequate for Discharge   Problem: Pain Managment: Goal: General experience of comfort will improve Outcome: Adequate for Discharge   Problem: Safety: Goal: Ability to remain free from injury will improve Outcome: Adequate for Discharge

## 2020-06-30 NOTE — Progress Notes (Signed)
Dr. Dwyane Dee gave the patient permission to go without an IV.

## 2020-06-30 NOTE — Discharge Summary (Signed)
Physician Discharge Summary  Ariel Phillips:[id number] DOB: 08/11/1932 DOA: 06/26/2020  PCP: Venia Carbon, MD  Admit date: 06/26/2020 .  Discharge date: 06/30/2020.  Admitted From:  [facility]  Disposition:  SNF ([facility])  Recommendations for Outpatient Follow-up:  1. Follow up with PCP in 1-2 weeks. 2. Please obtain BMP/CBC in one week. 3. Advised to follow-up with podiatry Dr. Luana Shu in 1 week. 4. Advised to take Lovenox 40 mg subcu daily for 40 days for post operative DVT prophylaxis. 5. Advised to take doxycycline 100 mg twice daily for 5 days for postoperative infection prophylaxis.  6. Patient would be nonweightbearing on the right lower extremity for 6 to 8 weeks.  Home Health: None.  Equipment/Devices: None  Discharge Condition: Stable CODE STATUS:DNR Diet recommendation: Heart Healthy  Brief Summary/ Hospital Course: This 86 years old female with PMH significant for hypothyroidism, seasonal allergies, GERD presents in the emergency department with complaints of right ankle pain.  Patient reports that she tripped and fell over an elevation on the concrete.  She denies any numbness,  tingling of the lower extremity.  X-ray foot shows right ankle fracture. Patient is admitted, podiatry is consulted,  Patient underwent right ankle bimalleolar ORIF.  Postoperative day 3.  She reports pain is improved.  PT and OT evaluation recommended SNF ,  Patient will be nonweightbearing for 6 to 8 weeks.  Podiatry recommended  Lovenox 40 mg subcu daily for 40 days for postoperative DVT prophylaxis.  Patient will also take doxycycline 100 mg twice daily for 7 days for postoperative infection prophylaxis.  Advised to follow-up with Dr. Luana Shu in 2 weeks.  Patient reports pain is improved patient is being discharged to skilled nursing facility for rehab.  She was managed for below problems    Discharge Diagnoses:  Principal Problem:   Ankle fracture Active Problems:    IBS (irritable bowel syndrome)   Hypothyroidism  Displaced trimalleolar Right ankle fracture secondary to mechanical fall : Patient presented with fall and has right ankle fracture. Adequate pain control with morphine 2 mg IV every 4 hours for severe pain,  Continue ketorolac 50 mg IV every 6 hours for moderate pain. Podiatry has been consulted.  Patient underwent bimalleolar ORIF  2/26. -Today's postoperative day 3,  patient reports pain is improved. Patient will be nonweightbearing for 4 to 6 weeks post procedure. -PT, OT recommended SNF. Patient will be on Lovenox for 6 to 8 weeks for DVT prophylaxis until ambulatory. Patient is started on prophylactic doxycycline for infection after surgery for 7 days.   Hypothyroidism -Continue levothyroxine 75 mcg in the morning.   IBS -Continue rifaximin 550 mg twice daily.    Discharge Instructions  Discharge Instructions     Call MD for:  difficulty breathing, headache or visual disturbances   Complete by: As directed    Call MD for:  persistant dizziness or light-headedness   Complete by: As directed    Call MD for:  persistant nausea and vomiting   Complete by: As directed    Call MD for:  severe uncontrolled pain   Complete by: As directed    Diet - low sodium heart healthy   Complete by: As directed    Diet Carb Modified   Complete by: As directed    Discharge instructions   Complete by: As directed    Advised to follow-up with primary care physician in 1 week.   Advised to follow-up with podiatry Dr. Luana Shu in 1 week.  Advised to take Lovenox 40 mg subcu daily for 40 days for post operative DVT prophylaxis  Advised to take doxycycline 100 mg twice daily for 5 days for postoperative infection prophylaxis.  Patient would be nonweightbearing on the right lower extremity for 6 to 8 weeks.   Discharge wound care:   Complete by: As directed    Follow-up with podiatry for wound care   Increase activity slowly   Complete by: As  directed       Allergies as of 06/30/2020       Reactions   Penicillins Diarrhea   Did it involve swelling of the face/tongue/throat, SOB, or low BP? No Did it involve sudden or severe rash/hives, skin peeling, or any reaction on the inside of your mouth or nose? No Did you need to seek medical attention at a hospital or doctor's office? Yes When did it last happen?      20+ years If all above answers are "NO", may proceed with cephalosporin use.   Latex Rash        Medication List     STOP taking these medications    ibuprofen 200 MG tablet Commonly known as: ADVIL   omeprazole 20 MG capsule Commonly known as: PRILOSEC       TAKE these medications    alum & mag hydroxide-simeth 361-443-15 MG/5ML suspension Commonly known as: MAALOX PLUS Take 10 mLs by mouth as needed for indigestion. Notes to patient: Not given this hospitalization   aspirin EC 81 MG tablet Take 1 tablet (81 mg total) by mouth daily.   carboxymethylcellulose 0.5 % Soln Commonly known as: REFRESH PLUS Place 2 drops into both eyes daily as needed (dry eyes). Notes to patient: Not given this hospitalization   diclofenac Sodium 1 % Gel Commonly known as: VOLTAREN Apply 2 g topically 4 (four) times daily as needed. Notes to patient: Not given this hospitalization   doxycycline 100 MG tablet Commonly known as: VIBRA-TABS Take 1 tablet (100 mg total) by mouth every 12 (twelve) hours.   enoxaparin 40 MG/0.4ML injection Commonly known as: LOVENOX Inject 0.4 mLs (40 mg total) into the skin daily.   levothyroxine 75 MCG tablet Commonly known as: SYNTHROID TAKE 1 TABLET EVERY DAY ON EMPTY STOMACHWITH A GLASS OF WATER AT LEAST 30-60 MINBEFORE BREAKFAST   loratadine 10 MG tablet Commonly known as: CLARITIN Take 10 mg by mouth at bedtime. Notes to patient: Not given this hospitalization   rifaximin 550 MG Tabs tablet Commonly known as: XIFAXAN Notes to patient: Not given this hospitalization    vitamin B-12 100 MCG tablet Commonly known as: CYANOCOBALAMIN Take 100 mcg by mouth daily. Notes to patient: Not given this hospitalization               Discharge Care Instructions  (From admission, onward)           Start     Ordered   06/30/20 0000  Discharge wound care:       Comments: Follow-up with podiatry for wound care   06/30/20 1104            Follow-up Information     Caroline More, DPM. Schedule an appointment as soon as possible for a visit in 1 week(s).   Specialty: Podiatry Contact information: [place] [address] [phone]         [name] I, MD Follow up in 1 week(s).   Specialties: Internal Medicine, Pediatrics Contact information: [address]  [place] [address] [phone]         Kate Sable, MD .   Specialties: Cardiology, Radiology Contact information: [address] [place] [address] [phone]                Allergies  Allergen Reactions  . Penicillins Diarrhea    Did it involve swelling of the face/tongue/throat, SOB, or low BP? No Did it involve sudden or severe rash/hives, skin peeling, or any reaction on the inside of your mouth or nose? No Did you need to seek medical attention at a hospital or doctor's office? Yes When did it last happen?      20+ years If all above answers are "NO", may proceed with cephalosporin use.   . Latex Rash    Consultations:  Podiatry   Procedures/Studies: . DG Ankle 2 Views Right  Result Date: 06/27/2020 CLINICAL DATA:  ORIF comminuted trimalleolar ankle fracture. EXAM: RIGHT ANKLE - 2 VIEW; DG C-ARM 1-60 MIN COMPARISON:  Right ankle radiographs-06/26/2020 FLUOROSCOPY TIME:  45 seconds FINDINGS: 9 spot intraoperative fluoroscopic images of the right ankle are provided for review. Images demonstrate on going sideplate fixation of the distal fibula and medial malleolus. Alignment appears anatomic. Expected  overlying skin staples and scattered foci of subcutaneous emphysema. No radiopaque foreign body. IMPRESSION: Post ORIF of trimalleolar ankle fracture without evidence of complication. Electronically Signed   By: Sandi Mariscal M.D.   On: 06/27/2020 13:26   DG Ankle Complete Right  Result Date: 06/27/2020 CLINICAL DATA:  ORIF right ankle fracture EXAM: RIGHT ANKLE - COMPLETE 3+ VIEW COMPARISON:  06/26/2020 FINDINGS: Post sideplate fixation of obliquely oriented distal fibular fracture and comminuted fracture of the medial malleolus. There is no definitive dedicated fixation of the posterior malleolar fracture however alignment appears anatomic. Interval restoration of the ankle mortise. Expected adjacent soft tissue swelling, scattered foci of subcutaneous emphysema and overlying skin staples. No radiopaque foreign body. IMPRESSION: Post sideplate fixation of distal fibular fracture and comminuted fracture of the medial malleolus, now with anatomic alignment Electronically Signed   By: Sandi Mariscal M.D.   On: 06/27/2020 13:22   DG Ankle Complete Right  Result Date: 06/26/2020 CLINICAL DATA:  Postreduction EXAM: RIGHT ANKLE - COMPLETE 3+ VIEW COMPARISON:  Right ankle radiographs from earlier today FINDINGS: Overlying cast obscures fine bone detail. No significant residual subluxation at the ankle mortise, noting asymmetric mild widening laterally at the ankle mortise. Mild 3 mm lateral and posterior displacement of the distal lateral malleolus fracture fragment. No significant displacement of the medial or posterior malleolar fractures. No focal osseous lesions. IMPRESSION: No significant residual subluxation at the ankle mortise, noting asymmetric mild widening laterally at the ankle mortise. Mild residual 3 mm lateral and posterior displacement of the distal lateral malleolus fracture fragment. No significant displacement of the medial or posterior malleolar fractures. Electronically Signed   By: Ilona Sorrel  M.D.   On: 06/26/2020 18:12   DG Ankle Complete Right  Result Date: 06/26/2020 CLINICAL DATA:  Fall with right ankle pain. Evaluate for fracture/dislocation EXAM: RIGHT ANKLE - COMPLETE 3+ VIEW COMPARISON:  Right foot radiographs 07/24/2015. FINDINGS: There is a moderately displaced trimalleolar fracture of the right ankle. Oblique fracture involving the distal femoral diaphysis demonstrates moderate lateral displacement. There is a laterally displaced fracture of the medial malleolus. There is resulting posterolateral subluxation of the talus. No tarsal bone fracture identified. The soft tissues are swollen around the ankle without evidence of foreign body or  soft tissue emphysema. IMPRESSION: Displaced trimalleolar fracture with posterolateral subluxation of the talus. Electronically Signed   By: Richardean Sale M.D.   On: 06/26/2020 15:52   DG Chest Portable 1 View  Result Date: 06/26/2020 CLINICAL DATA:  Preop for ankle fracture. EXAM: PORTABLE CHEST 1 VIEW COMPARISON:  Chest x-ray 05/22/2019 FINDINGS: The heart size and mediastinal contours are unchanged. Aortic arch calcifications. Incidentally noted azygos fissure. No focal consolidation. No pulmonary edema. No pleural effusion. No pneumothorax. No acute osseous abnormality. IMPRESSION: No active disease. Electronically Signed   By: Iven Finn M.D.   On: 06/26/2020 16:41   DG Knee Complete 4 Views Left  Result Date: 06/28/2020 CLINICAL DATA:  Left knee pain after fall. EXAM: LEFT KNEE - COMPLETE 4+ VIEW COMPARISON:  February 27, 2013. FINDINGS: No evidence of fracture, dislocation, or joint effusion. No evidence of arthropathy or other focal bone abnormality. Soft tissues are unremarkable. IMPRESSION: Negative. Electronically Signed   By: Marijo Conception M.D.   On: 06/28/2020 20:14   DG C-Arm 1-60 Min  Result Date: 06/27/2020 CLINICAL DATA:  ORIF comminuted trimalleolar ankle fracture. EXAM: RIGHT ANKLE - 2 VIEW; DG C-ARM 1-60 MIN  COMPARISON:  Right ankle radiographs-06/26/2020 FLUOROSCOPY TIME:  45 seconds FINDINGS: 9 spot intraoperative fluoroscopic images of the right ankle are provided for review. Images demonstrate on going sideplate fixation of the distal fibula and medial malleolus. Alignment appears anatomic. Expected overlying skin staples and scattered foci of subcutaneous emphysema. No radiopaque foreign body. IMPRESSION: Post ORIF of trimalleolar ankle fracture without evidence of complication. Electronically Signed   By: Sandi Mariscal M.D.   On: 06/27/2020 13:26    Right ankle open reduction and internal fixation   Subjective: Patient was seen and examined at bedside.  Overnight events noted.  Patient has participated in physical therapy yesterday and recommended skilled nursing facility.  Patient reports pain is better controlled.  Discharge Exam: Vitals:   06/30/20 0351 06/30/20 0804  BP: (!) 156/67 138/69  Pulse: 83 85  Resp: 16 16  Temp: 97.9 F (36.6 C) 98.2 F (36.8 C)  SpO2: 93% 95%   Vitals:   06/29/20 1939 06/29/20 2327 06/30/20 0351 06/30/20 0804  BP: 125/64 137/64 (!) 156/67 138/69  Pulse: 79 79 83 85  Resp: 16 18 16 16   Temp: 97.8 F (36.6 C) 98.1 F (36.7 C) 97.9 F (36.6 C) 98.2 F (36.8 C)  TempSrc:      SpO2: 96% 93% 93% 95%  Weight:      Height:        General: Pt is alert, awake, not in acute distress Cardiovascular: RRR, S1/S2 +, no rubs, no gallops Respiratory: CTA bilaterally, no wheezing, no rhonchi Abdominal: Soft, NT, ND, bowel sounds + Extremities: no edema, no cyanosis, right foot in immobilizer.    The results of significant diagnostics from this hospitalization (including imaging, microbiology, ancillary and laboratory) are listed below for reference.     Microbiology: Recent Results (from the past 240 hour(s))  SARS CORONAVIRUS 2 (TAT 6-24 HRS) Nasopharyngeal Nasopharyngeal Swab     Status: None   Collection Time: 06/26/20  5:03 PM   Specimen:  Nasopharyngeal Swab  Result Value Ref Range Status   SARS Coronavirus 2 NEGATIVE NEGATIVE Final    Comment: (NOTE) SARS-CoV-2 target nucleic acids are NOT DETECTED.  The SARS-CoV-2 RNA is generally detectable in upper and lower respiratory specimens during the acute phase of infection. Negative results do not preclude SARS-CoV-2 infection, do not rule  out co-infections with other pathogens, and should not be used as the sole basis for treatment or other patient management decisions. Negative results must be combined with clinical observations, patient history, and epidemiological information. The expected result is Negative.  Fact Sheet for Patients: SugarRoll.be  Fact Sheet for Healthcare Providers: https://www.woods-mathews.com/  This test is not yet approved or cleared by the [place] FDA and  has been authorized for detection and/or diagnosis of SARS-CoV-2 by FDA under an Emergency Use Authorization (EUA). This EUA will remain  in effect (meaning this test can be used) for the duration of the COVID-19 declaration under Se ction 564(b)(1) of the Act, 21 U.S.C. section 360bbb-3(b)(1), unless the authorization is terminated or revoked sooner.  Performed at [facility], [place] [address], [place], [place] [address]      Labs: BNP (last 3 results) No results for input(s): BNP in the last 8760 hours. Basic Metabolic Panel: Recent Labs  Lab 06/26/20 1519 06/27/20 0509 06/28/20 0527  NA 137 138 138  K 4.5 3.5 4.0  CL 106 107 107  CO2 25 25 25   GLUCOSE 102* 123* 117*  BUN 21 16 18   CREATININE 0.60 0.56 0.65  CALCIUM 9.1 8.5* 8.2*   Liver Function Tests: Recent Labs  Lab 06/26/20 1519  AST 22  ALT 14  ALKPHOS 81  BILITOT 0.7  PROT 7.3  ALBUMIN 3.9   No results for input(s): LIPASE, AMYLASE in the last 168 hours. No results for input(s): AMMONIA in the last 168 hours. CBC: Recent Labs  Lab 06/26/20 1519  06/27/20 0509 06/28/20 0527  WBC 5.6 10.4  --   NEUTROABS 3.5  --   --   HGB 14.1 14.1 12.6  HCT 42.7 42.9 38.3  MCV 94.3 93.5  --   PLT 241 200  --    Cardiac Enzymes: No results for input(s): CKTOTAL, CKMB, CKMBINDEX, TROPONINI in the last 168 hours. BNP: Invalid input(s): POCBNP CBG: No results for input(s): GLUCAP in the last 168 hours. D-Dimer No results for input(s): DDIMER in the last 72 hours. Hgb A1c No results for input(s): HGBA1C in the last 72 hours. Lipid Profile No results for input(s): CHOL, HDL, LDLCALC, TRIG, CHOLHDL, LDLDIRECT in the last 72 hours. Thyroid function studies No results for input(s): TSH, T4TOTAL, T3FREE, THYROIDAB in the last 72 hours.  Invalid input(s): FREET3 Anemia work up No results for input(s): VITAMINB12, FOLATE, FERRITIN, TIBC, IRON, RETICCTPCT in the last 72 hours. Urinalysis    Component Value Date/Time   COLORURINE YELLOW 09/09/2017 1531   APPEARANCEUR CLOUDY (A) 09/09/2017 1531   APPEARANCEUR Hazy 06/11/2013 2307   LABSPEC 1.015 09/09/2017 1531   LABSPEC 1.017 06/11/2013 2307   PHURINE 5.0 09/09/2017 1531   GLUCOSEU NEGATIVE 09/09/2017 1531   GLUCOSEU Negative 06/11/2013 2307   HGBUR MODERATE (A) 09/09/2017 1531   BILIRUBINUR NEGATIVE 09/09/2017 1531   BILIRUBINUR Negative 06/11/2013 2307   KETONESUR NEGATIVE 09/09/2017 1531   PROTEINUR 100 (A) 09/09/2017 1531   UROBILINOGEN 0.2 05/16/2012 1209   NITRITE NEGATIVE 09/09/2017 1531   LEUKOCYTESUR NEGATIVE 09/09/2017 1531   LEUKOCYTESUR Negative 06/11/2013 2307   Sepsis Labs Invalid input(s): PROCALCITONIN,  WBC,  LACTICIDVEN Microbiology Recent Results (from the past 240 hour(s))  SARS CORONAVIRUS 2 (TAT 6-24 HRS) Nasopharyngeal Nasopharyngeal Swab     Status: None   Collection Time: 06/26/20  5:03 PM   Specimen: Nasopharyngeal Swab  Result Value Ref Range Status   SARS Coronavirus 2 NEGATIVE NEGATIVE Final  Comment: (NOTE) SARS-CoV-2 target nucleic acids are NOT  DETECTED.  The SARS-CoV-2 RNA is generally detectable in upper and lower respiratory specimens during the acute phase of infection. Negative results do not preclude SARS-CoV-2 infection, do not rule out co-infections with other pathogens, and should not be used as the sole basis for treatment or other patient management decisions. Negative results must be combined with clinical observations, patient history, and epidemiological information. The expected result is Negative.  Fact Sheet for Patients: SugarRoll.be  Fact Sheet for Healthcare Providers: https://www.woods-mathews.com/  This test is not yet approved or cleared by the [place] FDA and  has been authorized for detection and/or diagnosis of SARS-CoV-2 by FDA under an Emergency Use Authorization (EUA). This EUA will remain  in effect (meaning this test can be used) for the duration of the COVID-19 declaration under Se ction 564(b)(1) of the Act, 21 U.S.C. section 360bbb-3(b)(1), unless the authorization is terminated or revoked sooner.  Performed at [facility], [place] [address] [place],  [address]      Time coordinating discharge: Over 30 minutes  SIGNED:   Shawna Clamp, MD  [facility] 06/30/2020, 11:05 AM Pager   If 7PM-7AM, please contact night-coverage [email or web address]

## 2020-07-02 DIAGNOSIS — F39 Unspecified mood [affective] disorder: Secondary | ICD-10-CM | POA: Diagnosis not present

## 2020-07-02 DIAGNOSIS — M84471A Pathological fracture, right ankle, initial encounter for fracture: Secondary | ICD-10-CM | POA: Diagnosis not present

## 2020-07-02 DIAGNOSIS — I2510 Atherosclerotic heart disease of native coronary artery without angina pectoris: Secondary | ICD-10-CM | POA: Diagnosis not present

## 2020-07-02 DIAGNOSIS — G3184 Mild cognitive impairment, so stated: Secondary | ICD-10-CM | POA: Diagnosis not present

## 2020-07-02 DIAGNOSIS — K580 Irritable bowel syndrome with diarrhea: Secondary | ICD-10-CM | POA: Diagnosis not present

## 2020-07-03 ENCOUNTER — Ambulatory Visit: Payer: Medicare Other | Admitting: Internal Medicine

## 2020-07-06 DIAGNOSIS — S82841D Displaced bimalleolar fracture of right lower leg, subsequent encounter for closed fracture with routine healing: Secondary | ICD-10-CM | POA: Diagnosis not present

## 2020-07-06 DIAGNOSIS — M25571 Pain in right ankle and joints of right foot: Secondary | ICD-10-CM | POA: Diagnosis not present

## 2020-07-10 ENCOUNTER — Ambulatory Visit (INDEPENDENT_AMBULATORY_CARE_PROVIDER_SITE_OTHER): Payer: Medicare Other | Admitting: Cardiology

## 2020-07-10 ENCOUNTER — Encounter: Payer: Self-pay | Admitting: Cardiology

## 2020-07-10 ENCOUNTER — Other Ambulatory Visit: Payer: Self-pay

## 2020-07-10 DIAGNOSIS — I2510 Atherosclerotic heart disease of native coronary artery without angina pectoris: Secondary | ICD-10-CM

## 2020-07-10 DIAGNOSIS — K2100 Gastro-esophageal reflux disease with esophagitis, without bleeding: Secondary | ICD-10-CM | POA: Diagnosis not present

## 2020-07-10 NOTE — Patient Instructions (Signed)

## 2020-07-10 NOTE — Progress Notes (Signed)
Cardiology Office Note:    Date:  07/10/2020   ID:  [name], DOB 06-Jun-1932, MRN [id number]  PCP:  Venia Carbon, MD  Cardiologist:  Kate Sable, MD  Electrophysiologist:  None   Referring MD: Venia Carbon, MD   Chief Complaint  Patient presents with  . Other    6 motnh follow up - Patient denies symptoms at this time. Meds reviewed verbally with patient.     History of Present Illness:    Ariel Phillips is a 87 y.o. female with a hx of GERD, non obstructive CAD, who presents for follow-up.    Patient is being seen due to history of CAD.  Work-up with CTA showed nonobstructive LAD disease.  Tolerating aspirin.  Feels well from a cardiac perspective.  Denies chest pain.  She fell 2 weeks ago and fractured her right ankle.  Heart surgery done, currently in a cast.    Prior notes Echo 07/2019 showed normal systolic function, impaired relaxation EF 60 to 65%. Coronary CTA showed a calcium score of 200, moderate LAD disease, FFR CT did not show any significant stenosis in the LAD.  FFR CT showed stenosis in diagonal 1, small vessel.   Past Medical History:  Diagnosis Date  . Allergic asthma   . Allergic rhinitis due to pollen   . Allergy   . GERD (gastroesophageal reflux disease)   . IBS (irritable bowel syndrome)   . Osteopenia   . Unspecified hypothyroidism     Past Surgical History:  Procedure Laterality Date  . ANAL FISSURE REPAIR  1950's  . BASAL CELL CARCINOMA EXCISION  2000's   nose  . CHOLECYSTECTOMY  2008  . COLONOSCOPY  Multiple   Negative screening exams  . ENDOSCOPIC RETROGRADE CHOLANGIOPANCREATOGRAPHY (ERCP) WITH PROPOFOL N/A 02/13/2019   Procedure: ENDOSCOPIC RETROGRADE CHOLANGIOPANCREATOGRAPHY (ERCP) WITH PROPOFOL;  Surgeon: Gatha Mayer, MD;  Location: [facility];  Service: Endoscopy;  Laterality: N/A;  . [place] or so   with fissure repair  . ORIF ANKLE FRACTURE Right 06/27/2020   Procedure: OPEN REDUCTION  INTERNAL FIXATION (ORIF) ANKLE FRACTURE;  Surgeon: Caroline More, DPM;  Location: [facility];  Service: Podiatry;  Laterality: Right;  . REMOVAL OF STONES  02/13/2019   Procedure: REMOVAL OF STONES;  Surgeon: Gatha Mayer, MD;  Location: [facility];  Service: Endoscopy;;  . [name]  02/13/2019   Procedure: [name];  Surgeon: Gatha Mayer, MD;  Location: [facility];  Service: Endoscopy;;  . TUBAL LIGATION      Current Medications: Current Meds  Medication Sig  . alum & mag hydroxide-simeth (MAALOX PLUS) 400-400-40 MG/5ML suspension Take 10 mLs by mouth as needed for indigestion.  Marland Kitchen aspirin EC 81 MG tablet Take 1 tablet (81 mg total) by mouth daily.  . carboxymethylcellulose (REFRESH PLUS) 0.5 % SOLN Place 2 drops into both eyes daily as needed (dry eyes).   Marland Kitchen diclofenac Sodium (VOLTAREN) 1 % GEL Apply 2 g topically 4 (four) times daily as needed.  . enoxaparin (LOVENOX) 40 MG/0.4ML injection Inject 0.4 mLs (40 mg total) into the skin daily.  Marland Kitchen levothyroxine (SYNTHROID) 75 MCG tablet TAKE 1 TABLET EVERY DAY ON EMPTY STOMACHWITH A GLASS OF WATER AT LEAST 30-60 MINBEFORE BREAKFAST  . loratadine (CLARITIN) 10 MG tablet Take 10 mg by mouth at bedtime.   . vitamin B-12 (CYANOCOBALAMIN) 100 MCG tablet Take 100 mcg by mouth daily.     Allergies:   Penicillins and Latex  Social History   Socioeconomic History  . Marital status: Divorced    Spouse name: Not on file  . Number of children: 1  . Years of education: Not on file  . Highest education level: Not on file  Occupational History  . Occupation: Retired historian--consultant  Tobacco Use  . Smoking status: Never Smoker  . Smokeless tobacco: Never Used  Vaping Use  . Vaping Use: Never used  Substance and Sexual Activity  . Alcohol use: Yes    Comment: rare wine  . Drug use: No  . Sexual activity: Not Currently  Other Topics Concern  . Not on file  Social History Narrative   Divorced and retired , moved  from [place], [place]   1 Daughter in [place]   Has living will   Daughter has health care POA.---[name]   Requests DNR--order done 06/19/12   Would not want prolonged mechanical ventilation   May accept tube feeds temporarily but not if cognitively unaware   Social Determinants of Health   Financial Resource Strain: Not on file  Food Insecurity: Not on file  Transportation Needs: Not on file  Physical Activity: Not on file  Stress: Not on file  Social Connections: Not on file     Family History: The patient's family history includes COPD in her mother; Cancer in her mother and paternal grandmother; Diabetes in her sister; Hypertension in her sister; Stroke in an other family member; Transient ischemic attack in her mother. There is no history of Heart disease.  ROS:   Please see the history of present illness.     All other systems reviewed and are negative.  EKGs/Labs/Other Studies Reviewed:    The following studies were reviewed today:   EKG:  EKG is  ordered today.  The ekg ordered today demonstrates normal sinus rhythm, normal ECG.  Recent Labs: 02/04/2020: TSH 3.62 06/26/2020: ALT 14 06/27/2020: Platelets 200 06/28/2020: BUN 18; Creatinine, Ser 0.65; Hemoglobin 12.6; Potassium 4.0; Sodium 138  Recent Lipid Panel    Component Value Date/Time   CHOL 172 02/04/2020 1214   TRIG 83.0 02/04/2020 1214   HDL 59.60 02/04/2020 1214   CHOLHDL 3 02/04/2020 1214   VLDL 16.6 02/04/2020 1214   LDLCALC 96 02/04/2020 1214    Physical Exam:    VS:  BP 112/60 (BP Location: Right Arm, Patient Position: Sitting, Cuff Size: Normal)   Pulse 92   Ht 5\' 3"  (1.6 m)   Wt 148 lb (67.1 kg)   SpO2 96%   BMI 26.22 kg/m     Wt Readings from Last 3 Encounters:  07/10/20 148 lb (67.1 kg)  06/26/20 152 lb (68.9 kg)  03/04/20 151 lb 1.9 oz (68.5 kg)     GEN:  Well nourished, well developed in no acute distress HEENT: Normal NECK: No JVD; No carotid bruits LYMPHATICS:  No lymphadenopathy CARDIAC: RRR, no murmurs, rubs, gallops RESPIRATORY:  Clear to auscultation without rales, wheezing or rhonchi  ABDOMEN: Soft, non-tender, non-distended MUSCULOSKELETAL:  No edema; right ankle in dressing/scars noted SKIN: Warm and dry NEUROLOGIC:  Alert and oriented x 3 PSYCHIATRIC:  Normal affect   ASSESSMENT:    1. Coronary artery disease involving native coronary artery of native heart without angina pectoris   2. Gastroesophageal reflux disease with esophagitis without hemorrhage    PLAN:    In order of problems listed above:  1. Nonobstructive CAD, moderate LAD disease.  FFR CT showed stenosis in the first diagonal, small vessel. denies symptoms  of chest pain.  Echocardiogram showed preserved EF.  Continue aspirin, statin not of much benefit in this 87 year old lady, especially with last cholesterol numbers normal. 2. Patient with history of reflux, symptoms are well controlled.  Continue Maalox as needed.  F/u in 12 months  Total encounter time 35 minutes  Greater than 50% was spent in counseling and coordination of care with the patient   This note was generated in part or whole with voice recognition software. Voice recognition is usually quite accurate but there are transcription errors that can and very often do occur. I apologize for any typographical errors that were not detected and corrected.  Medication Adjustments/Labs and Tests Ordered: Current medicines are reviewed at length with the patient today.  Concerns regarding medicines are outlined above.  Orders Placed This Encounter  Procedures  . EKG 12-Lead   No orders of the defined types were placed in this encounter.   Patient Instructions  Medication Instructions:  Your physician recommends that you continue on your current medications as directed. Please refer to the Current Medication list given to you today.  *If you need a refill on your cardiac medications before your next  appointment, please call your pharmacy*   Lab Work: None ordered If you have labs (blood work) drawn today and your tests are completely normal, you will receive your results only by: Marland Kitchen MyChart Message (if you have MyChart) OR . A paper copy in the mail If you have any lab test that is abnormal or we need to change your treatment, we will call you to review the results.   Testing/Procedures: None ordered   Follow-Up: At [facility], you and your health needs are our priority.  As part of our continuing mission to provide you with exceptional heart care, we have created designated Provider Care Teams.  These Care Teams include your primary Cardiologist (physician) and Advanced Practice Providers (APPs -  Physician Assistants and Nurse Practitioners) who all work together to provide you with the care you need, when you need it.  We recommend signing up for the patient portal called "MyChart".  Sign up information is provided on this After Visit Summary.  MyChart is used to connect with patients for Virtual Visits (Telemedicine).  Patients are able to view lab/test results, encounter notes, upcoming appointments, etc.  Non-urgent messages can be sent to your provider as well.   To learn more about what you can do with MyChart, go to [email or web address].    Your next appointment:   1 year(s)  The format for your next appointment:   In Person  Provider:   Kate Sable, MD   Other Instructions      Signed, Kate Sable, MD  07/10/2020 12:37 PM    [place]

## 2020-07-15 DIAGNOSIS — S82841D Displaced bimalleolar fracture of right lower leg, subsequent encounter for closed fracture with routine healing: Secondary | ICD-10-CM | POA: Diagnosis not present

## 2020-07-20 ENCOUNTER — Ambulatory Visit: Payer: Medicare Other | Admitting: Cardiology

## 2020-07-29 DIAGNOSIS — S82841D Displaced bimalleolar fracture of right lower leg, subsequent encounter for closed fracture with routine healing: Secondary | ICD-10-CM | POA: Diagnosis not present

## 2020-07-29 DIAGNOSIS — M25571 Pain in right ankle and joints of right foot: Secondary | ICD-10-CM | POA: Diagnosis not present

## 2020-07-30 DIAGNOSIS — R262 Difficulty in walking, not elsewhere classified: Secondary | ICD-10-CM | POA: Diagnosis not present

## 2020-07-30 DIAGNOSIS — I2510 Atherosclerotic heart disease of native coronary artery without angina pectoris: Secondary | ICD-10-CM | POA: Diagnosis not present

## 2020-07-30 DIAGNOSIS — K589 Irritable bowel syndrome without diarrhea: Secondary | ICD-10-CM | POA: Diagnosis not present

## 2020-07-30 DIAGNOSIS — M542 Cervicalgia: Secondary | ICD-10-CM | POA: Diagnosis not present

## 2020-07-30 DIAGNOSIS — M6281 Muscle weakness (generalized): Secondary | ICD-10-CM | POA: Diagnosis not present

## 2020-07-30 DIAGNOSIS — R4189 Other symptoms and signs involving cognitive functions and awareness: Secondary | ICD-10-CM | POA: Diagnosis not present

## 2020-07-30 DIAGNOSIS — K219 Gastro-esophageal reflux disease without esophagitis: Secondary | ICD-10-CM | POA: Diagnosis not present

## 2020-07-30 DIAGNOSIS — E039 Hypothyroidism, unspecified: Secondary | ICD-10-CM | POA: Diagnosis not present

## 2020-07-30 DIAGNOSIS — F39 Unspecified mood [affective] disorder: Secondary | ICD-10-CM | POA: Diagnosis not present

## 2020-07-30 DIAGNOSIS — Z741 Need for assistance with personal care: Secondary | ICD-10-CM | POA: Diagnosis not present

## 2020-07-30 DIAGNOSIS — R278 Other lack of coordination: Secondary | ICD-10-CM | POA: Diagnosis not present

## 2020-07-30 DIAGNOSIS — W1839XD Other fall on same level, subsequent encounter: Secondary | ICD-10-CM | POA: Diagnosis not present

## 2020-07-30 DIAGNOSIS — S82851D Displaced trimalleolar fracture of right lower leg, subsequent encounter for closed fracture with routine healing: Secondary | ICD-10-CM | POA: Diagnosis not present

## 2020-08-03 DIAGNOSIS — R262 Difficulty in walking, not elsewhere classified: Secondary | ICD-10-CM | POA: Diagnosis not present

## 2020-08-03 DIAGNOSIS — R4189 Other symptoms and signs involving cognitive functions and awareness: Secondary | ICD-10-CM | POA: Diagnosis not present

## 2020-08-03 DIAGNOSIS — E039 Hypothyroidism, unspecified: Secondary | ICD-10-CM | POA: Diagnosis not present

## 2020-08-03 DIAGNOSIS — W1839XD Other fall on same level, subsequent encounter: Secondary | ICD-10-CM | POA: Diagnosis not present

## 2020-08-03 DIAGNOSIS — M542 Cervicalgia: Secondary | ICD-10-CM | POA: Diagnosis not present

## 2020-08-03 DIAGNOSIS — I2510 Atherosclerotic heart disease of native coronary artery without angina pectoris: Secondary | ICD-10-CM | POA: Diagnosis not present

## 2020-08-03 DIAGNOSIS — M6281 Muscle weakness (generalized): Secondary | ICD-10-CM | POA: Diagnosis not present

## 2020-08-03 DIAGNOSIS — R278 Other lack of coordination: Secondary | ICD-10-CM | POA: Diagnosis not present

## 2020-08-03 DIAGNOSIS — Z741 Need for assistance with personal care: Secondary | ICD-10-CM | POA: Diagnosis not present

## 2020-08-03 DIAGNOSIS — K589 Irritable bowel syndrome without diarrhea: Secondary | ICD-10-CM | POA: Diagnosis not present

## 2020-08-03 DIAGNOSIS — F39 Unspecified mood [affective] disorder: Secondary | ICD-10-CM | POA: Diagnosis not present

## 2020-08-03 DIAGNOSIS — S82851D Displaced trimalleolar fracture of right lower leg, subsequent encounter for closed fracture with routine healing: Secondary | ICD-10-CM | POA: Diagnosis not present

## 2020-08-03 DIAGNOSIS — K219 Gastro-esophageal reflux disease without esophagitis: Secondary | ICD-10-CM | POA: Diagnosis not present

## 2020-08-05 DIAGNOSIS — R262 Difficulty in walking, not elsewhere classified: Secondary | ICD-10-CM | POA: Diagnosis not present

## 2020-08-05 DIAGNOSIS — M542 Cervicalgia: Secondary | ICD-10-CM | POA: Diagnosis not present

## 2020-08-05 DIAGNOSIS — K219 Gastro-esophageal reflux disease without esophagitis: Secondary | ICD-10-CM | POA: Diagnosis not present

## 2020-08-05 DIAGNOSIS — W1839XD Other fall on same level, subsequent encounter: Secondary | ICD-10-CM | POA: Diagnosis not present

## 2020-08-05 DIAGNOSIS — M6281 Muscle weakness (generalized): Secondary | ICD-10-CM | POA: Diagnosis not present

## 2020-08-05 DIAGNOSIS — S82851D Displaced trimalleolar fracture of right lower leg, subsequent encounter for closed fracture with routine healing: Secondary | ICD-10-CM | POA: Diagnosis not present

## 2020-08-07 DIAGNOSIS — S82851D Displaced trimalleolar fracture of right lower leg, subsequent encounter for closed fracture with routine healing: Secondary | ICD-10-CM | POA: Diagnosis not present

## 2020-08-07 DIAGNOSIS — W1839XD Other fall on same level, subsequent encounter: Secondary | ICD-10-CM | POA: Diagnosis not present

## 2020-08-07 DIAGNOSIS — R262 Difficulty in walking, not elsewhere classified: Secondary | ICD-10-CM | POA: Diagnosis not present

## 2020-08-07 DIAGNOSIS — M542 Cervicalgia: Secondary | ICD-10-CM | POA: Diagnosis not present

## 2020-08-07 DIAGNOSIS — K219 Gastro-esophageal reflux disease without esophagitis: Secondary | ICD-10-CM | POA: Diagnosis not present

## 2020-08-07 DIAGNOSIS — M6281 Muscle weakness (generalized): Secondary | ICD-10-CM | POA: Diagnosis not present

## 2020-08-10 DIAGNOSIS — R262 Difficulty in walking, not elsewhere classified: Secondary | ICD-10-CM | POA: Diagnosis not present

## 2020-08-10 DIAGNOSIS — S82851D Displaced trimalleolar fracture of right lower leg, subsequent encounter for closed fracture with routine healing: Secondary | ICD-10-CM | POA: Diagnosis not present

## 2020-08-10 DIAGNOSIS — M6281 Muscle weakness (generalized): Secondary | ICD-10-CM | POA: Diagnosis not present

## 2020-08-10 DIAGNOSIS — K219 Gastro-esophageal reflux disease without esophagitis: Secondary | ICD-10-CM | POA: Diagnosis not present

## 2020-08-10 DIAGNOSIS — M542 Cervicalgia: Secondary | ICD-10-CM | POA: Diagnosis not present

## 2020-08-10 DIAGNOSIS — W1839XD Other fall on same level, subsequent encounter: Secondary | ICD-10-CM | POA: Diagnosis not present

## 2020-08-12 DIAGNOSIS — M6281 Muscle weakness (generalized): Secondary | ICD-10-CM | POA: Diagnosis not present

## 2020-08-12 DIAGNOSIS — W1839XD Other fall on same level, subsequent encounter: Secondary | ICD-10-CM | POA: Diagnosis not present

## 2020-08-12 DIAGNOSIS — S82851D Displaced trimalleolar fracture of right lower leg, subsequent encounter for closed fracture with routine healing: Secondary | ICD-10-CM | POA: Diagnosis not present

## 2020-08-12 DIAGNOSIS — M542 Cervicalgia: Secondary | ICD-10-CM | POA: Diagnosis not present

## 2020-08-12 DIAGNOSIS — R262 Difficulty in walking, not elsewhere classified: Secondary | ICD-10-CM | POA: Diagnosis not present

## 2020-08-12 DIAGNOSIS — S82841D Displaced bimalleolar fracture of right lower leg, subsequent encounter for closed fracture with routine healing: Secondary | ICD-10-CM | POA: Diagnosis not present

## 2020-08-12 DIAGNOSIS — K219 Gastro-esophageal reflux disease without esophagitis: Secondary | ICD-10-CM | POA: Diagnosis not present

## 2020-08-14 DIAGNOSIS — S82851D Displaced trimalleolar fracture of right lower leg, subsequent encounter for closed fracture with routine healing: Secondary | ICD-10-CM | POA: Diagnosis not present

## 2020-08-14 DIAGNOSIS — M542 Cervicalgia: Secondary | ICD-10-CM | POA: Diagnosis not present

## 2020-08-14 DIAGNOSIS — K219 Gastro-esophageal reflux disease without esophagitis: Secondary | ICD-10-CM | POA: Diagnosis not present

## 2020-08-14 DIAGNOSIS — R262 Difficulty in walking, not elsewhere classified: Secondary | ICD-10-CM | POA: Diagnosis not present

## 2020-08-14 DIAGNOSIS — M6281 Muscle weakness (generalized): Secondary | ICD-10-CM | POA: Diagnosis not present

## 2020-08-14 DIAGNOSIS — W1839XD Other fall on same level, subsequent encounter: Secondary | ICD-10-CM | POA: Diagnosis not present

## 2020-08-17 DIAGNOSIS — R262 Difficulty in walking, not elsewhere classified: Secondary | ICD-10-CM | POA: Diagnosis not present

## 2020-08-17 DIAGNOSIS — S82851D Displaced trimalleolar fracture of right lower leg, subsequent encounter for closed fracture with routine healing: Secondary | ICD-10-CM | POA: Diagnosis not present

## 2020-08-17 DIAGNOSIS — M6281 Muscle weakness (generalized): Secondary | ICD-10-CM | POA: Diagnosis not present

## 2020-08-17 DIAGNOSIS — K219 Gastro-esophageal reflux disease without esophagitis: Secondary | ICD-10-CM | POA: Diagnosis not present

## 2020-08-17 DIAGNOSIS — M542 Cervicalgia: Secondary | ICD-10-CM | POA: Diagnosis not present

## 2020-08-17 DIAGNOSIS — W1839XD Other fall on same level, subsequent encounter: Secondary | ICD-10-CM | POA: Diagnosis not present

## 2020-08-19 DIAGNOSIS — S82851D Displaced trimalleolar fracture of right lower leg, subsequent encounter for closed fracture with routine healing: Secondary | ICD-10-CM | POA: Diagnosis not present

## 2020-08-19 DIAGNOSIS — W1839XD Other fall on same level, subsequent encounter: Secondary | ICD-10-CM | POA: Diagnosis not present

## 2020-08-19 DIAGNOSIS — M6281 Muscle weakness (generalized): Secondary | ICD-10-CM | POA: Diagnosis not present

## 2020-08-19 DIAGNOSIS — M542 Cervicalgia: Secondary | ICD-10-CM | POA: Diagnosis not present

## 2020-08-19 DIAGNOSIS — K219 Gastro-esophageal reflux disease without esophagitis: Secondary | ICD-10-CM | POA: Diagnosis not present

## 2020-08-19 DIAGNOSIS — R262 Difficulty in walking, not elsewhere classified: Secondary | ICD-10-CM | POA: Diagnosis not present

## 2020-08-21 DIAGNOSIS — S82851D Displaced trimalleolar fracture of right lower leg, subsequent encounter for closed fracture with routine healing: Secondary | ICD-10-CM | POA: Diagnosis not present

## 2020-08-21 DIAGNOSIS — M542 Cervicalgia: Secondary | ICD-10-CM | POA: Diagnosis not present

## 2020-08-21 DIAGNOSIS — R262 Difficulty in walking, not elsewhere classified: Secondary | ICD-10-CM | POA: Diagnosis not present

## 2020-08-21 DIAGNOSIS — M6281 Muscle weakness (generalized): Secondary | ICD-10-CM | POA: Diagnosis not present

## 2020-08-21 DIAGNOSIS — W1839XD Other fall on same level, subsequent encounter: Secondary | ICD-10-CM | POA: Diagnosis not present

## 2020-08-21 DIAGNOSIS — K219 Gastro-esophageal reflux disease without esophagitis: Secondary | ICD-10-CM | POA: Diagnosis not present

## 2020-08-24 DIAGNOSIS — K219 Gastro-esophageal reflux disease without esophagitis: Secondary | ICD-10-CM | POA: Diagnosis not present

## 2020-08-24 DIAGNOSIS — S82851D Displaced trimalleolar fracture of right lower leg, subsequent encounter for closed fracture with routine healing: Secondary | ICD-10-CM | POA: Diagnosis not present

## 2020-08-24 DIAGNOSIS — M6281 Muscle weakness (generalized): Secondary | ICD-10-CM | POA: Diagnosis not present

## 2020-08-24 DIAGNOSIS — W1839XD Other fall on same level, subsequent encounter: Secondary | ICD-10-CM | POA: Diagnosis not present

## 2020-08-24 DIAGNOSIS — M542 Cervicalgia: Secondary | ICD-10-CM | POA: Diagnosis not present

## 2020-08-24 DIAGNOSIS — R262 Difficulty in walking, not elsewhere classified: Secondary | ICD-10-CM | POA: Diagnosis not present

## 2020-08-26 DIAGNOSIS — S82851D Displaced trimalleolar fracture of right lower leg, subsequent encounter for closed fracture with routine healing: Secondary | ICD-10-CM | POA: Diagnosis not present

## 2020-08-26 DIAGNOSIS — W1839XD Other fall on same level, subsequent encounter: Secondary | ICD-10-CM | POA: Diagnosis not present

## 2020-08-26 DIAGNOSIS — R262 Difficulty in walking, not elsewhere classified: Secondary | ICD-10-CM | POA: Diagnosis not present

## 2020-08-26 DIAGNOSIS — M542 Cervicalgia: Secondary | ICD-10-CM | POA: Diagnosis not present

## 2020-08-26 DIAGNOSIS — K219 Gastro-esophageal reflux disease without esophagitis: Secondary | ICD-10-CM | POA: Diagnosis not present

## 2020-08-26 DIAGNOSIS — M6281 Muscle weakness (generalized): Secondary | ICD-10-CM | POA: Diagnosis not present

## 2020-08-28 DIAGNOSIS — M6281 Muscle weakness (generalized): Secondary | ICD-10-CM | POA: Diagnosis not present

## 2020-08-28 DIAGNOSIS — K219 Gastro-esophageal reflux disease without esophagitis: Secondary | ICD-10-CM | POA: Diagnosis not present

## 2020-08-28 DIAGNOSIS — W1839XD Other fall on same level, subsequent encounter: Secondary | ICD-10-CM | POA: Diagnosis not present

## 2020-08-28 DIAGNOSIS — S82851D Displaced trimalleolar fracture of right lower leg, subsequent encounter for closed fracture with routine healing: Secondary | ICD-10-CM | POA: Diagnosis not present

## 2020-08-28 DIAGNOSIS — M542 Cervicalgia: Secondary | ICD-10-CM | POA: Diagnosis not present

## 2020-08-28 DIAGNOSIS — R262 Difficulty in walking, not elsewhere classified: Secondary | ICD-10-CM | POA: Diagnosis not present

## 2020-08-31 DIAGNOSIS — R262 Difficulty in walking, not elsewhere classified: Secondary | ICD-10-CM | POA: Diagnosis not present

## 2020-08-31 DIAGNOSIS — M6281 Muscle weakness (generalized): Secondary | ICD-10-CM | POA: Diagnosis not present

## 2020-08-31 DIAGNOSIS — K589 Irritable bowel syndrome without diarrhea: Secondary | ICD-10-CM | POA: Diagnosis not present

## 2020-08-31 DIAGNOSIS — F39 Unspecified mood [affective] disorder: Secondary | ICD-10-CM | POA: Diagnosis not present

## 2020-08-31 DIAGNOSIS — I2510 Atherosclerotic heart disease of native coronary artery without angina pectoris: Secondary | ICD-10-CM | POA: Diagnosis not present

## 2020-08-31 DIAGNOSIS — R4189 Other symptoms and signs involving cognitive functions and awareness: Secondary | ICD-10-CM | POA: Diagnosis not present

## 2020-08-31 DIAGNOSIS — Z741 Need for assistance with personal care: Secondary | ICD-10-CM | POA: Diagnosis not present

## 2020-08-31 DIAGNOSIS — K219 Gastro-esophageal reflux disease without esophagitis: Secondary | ICD-10-CM | POA: Diagnosis not present

## 2020-08-31 DIAGNOSIS — W1839XD Other fall on same level, subsequent encounter: Secondary | ICD-10-CM | POA: Diagnosis not present

## 2020-08-31 DIAGNOSIS — R278 Other lack of coordination: Secondary | ICD-10-CM | POA: Diagnosis not present

## 2020-08-31 DIAGNOSIS — E039 Hypothyroidism, unspecified: Secondary | ICD-10-CM | POA: Diagnosis not present

## 2020-08-31 DIAGNOSIS — M542 Cervicalgia: Secondary | ICD-10-CM | POA: Diagnosis not present

## 2020-08-31 DIAGNOSIS — S82851D Displaced trimalleolar fracture of right lower leg, subsequent encounter for closed fracture with routine healing: Secondary | ICD-10-CM | POA: Diagnosis not present

## 2020-09-02 DIAGNOSIS — M6281 Muscle weakness (generalized): Secondary | ICD-10-CM | POA: Diagnosis not present

## 2020-09-02 DIAGNOSIS — M542 Cervicalgia: Secondary | ICD-10-CM | POA: Diagnosis not present

## 2020-09-02 DIAGNOSIS — W1839XD Other fall on same level, subsequent encounter: Secondary | ICD-10-CM | POA: Diagnosis not present

## 2020-09-02 DIAGNOSIS — R262 Difficulty in walking, not elsewhere classified: Secondary | ICD-10-CM | POA: Diagnosis not present

## 2020-09-02 DIAGNOSIS — K219 Gastro-esophageal reflux disease without esophagitis: Secondary | ICD-10-CM | POA: Diagnosis not present

## 2020-09-02 DIAGNOSIS — S82851D Displaced trimalleolar fracture of right lower leg, subsequent encounter for closed fracture with routine healing: Secondary | ICD-10-CM | POA: Diagnosis not present

## 2020-09-07 DIAGNOSIS — M542 Cervicalgia: Secondary | ICD-10-CM | POA: Diagnosis not present

## 2020-09-07 DIAGNOSIS — K219 Gastro-esophageal reflux disease without esophagitis: Secondary | ICD-10-CM | POA: Diagnosis not present

## 2020-09-07 DIAGNOSIS — R262 Difficulty in walking, not elsewhere classified: Secondary | ICD-10-CM | POA: Diagnosis not present

## 2020-09-07 DIAGNOSIS — W1839XD Other fall on same level, subsequent encounter: Secondary | ICD-10-CM | POA: Diagnosis not present

## 2020-09-07 DIAGNOSIS — S82851D Displaced trimalleolar fracture of right lower leg, subsequent encounter for closed fracture with routine healing: Secondary | ICD-10-CM | POA: Diagnosis not present

## 2020-09-07 DIAGNOSIS — M6281 Muscle weakness (generalized): Secondary | ICD-10-CM | POA: Diagnosis not present

## 2020-09-09 DIAGNOSIS — R262 Difficulty in walking, not elsewhere classified: Secondary | ICD-10-CM | POA: Diagnosis not present

## 2020-09-09 DIAGNOSIS — B351 Tinea unguium: Secondary | ICD-10-CM | POA: Diagnosis not present

## 2020-09-09 DIAGNOSIS — M6281 Muscle weakness (generalized): Secondary | ICD-10-CM | POA: Diagnosis not present

## 2020-09-09 DIAGNOSIS — M79674 Pain in right toe(s): Secondary | ICD-10-CM | POA: Diagnosis not present

## 2020-09-09 DIAGNOSIS — Z09 Encounter for follow-up examination after completed treatment for conditions other than malignant neoplasm: Secondary | ICD-10-CM | POA: Diagnosis not present

## 2020-09-09 DIAGNOSIS — M542 Cervicalgia: Secondary | ICD-10-CM | POA: Diagnosis not present

## 2020-09-09 DIAGNOSIS — M79675 Pain in left toe(s): Secondary | ICD-10-CM | POA: Diagnosis not present

## 2020-09-09 DIAGNOSIS — K219 Gastro-esophageal reflux disease without esophagitis: Secondary | ICD-10-CM | POA: Diagnosis not present

## 2020-09-09 DIAGNOSIS — S82841D Displaced bimalleolar fracture of right lower leg, subsequent encounter for closed fracture with routine healing: Secondary | ICD-10-CM | POA: Diagnosis not present

## 2020-09-09 DIAGNOSIS — W1839XD Other fall on same level, subsequent encounter: Secondary | ICD-10-CM | POA: Diagnosis not present

## 2020-09-09 DIAGNOSIS — S82851D Displaced trimalleolar fracture of right lower leg, subsequent encounter for closed fracture with routine healing: Secondary | ICD-10-CM | POA: Diagnosis not present

## 2020-09-09 DIAGNOSIS — M25571 Pain in right ankle and joints of right foot: Secondary | ICD-10-CM | POA: Diagnosis not present

## 2020-09-11 DIAGNOSIS — M6281 Muscle weakness (generalized): Secondary | ICD-10-CM | POA: Diagnosis not present

## 2020-09-11 DIAGNOSIS — M542 Cervicalgia: Secondary | ICD-10-CM | POA: Diagnosis not present

## 2020-09-11 DIAGNOSIS — K219 Gastro-esophageal reflux disease without esophagitis: Secondary | ICD-10-CM | POA: Diagnosis not present

## 2020-09-11 DIAGNOSIS — S82851D Displaced trimalleolar fracture of right lower leg, subsequent encounter for closed fracture with routine healing: Secondary | ICD-10-CM | POA: Diagnosis not present

## 2020-09-11 DIAGNOSIS — R262 Difficulty in walking, not elsewhere classified: Secondary | ICD-10-CM | POA: Diagnosis not present

## 2020-09-11 DIAGNOSIS — W1839XD Other fall on same level, subsequent encounter: Secondary | ICD-10-CM | POA: Diagnosis not present

## 2020-09-14 DIAGNOSIS — M6281 Muscle weakness (generalized): Secondary | ICD-10-CM | POA: Diagnosis not present

## 2020-09-14 DIAGNOSIS — W1839XD Other fall on same level, subsequent encounter: Secondary | ICD-10-CM | POA: Diagnosis not present

## 2020-09-14 DIAGNOSIS — S82851D Displaced trimalleolar fracture of right lower leg, subsequent encounter for closed fracture with routine healing: Secondary | ICD-10-CM | POA: Diagnosis not present

## 2020-09-14 DIAGNOSIS — M542 Cervicalgia: Secondary | ICD-10-CM | POA: Diagnosis not present

## 2020-09-14 DIAGNOSIS — R262 Difficulty in walking, not elsewhere classified: Secondary | ICD-10-CM | POA: Diagnosis not present

## 2020-09-14 DIAGNOSIS — K219 Gastro-esophageal reflux disease without esophagitis: Secondary | ICD-10-CM | POA: Diagnosis not present

## 2020-09-17 DIAGNOSIS — Z23 Encounter for immunization: Secondary | ICD-10-CM | POA: Diagnosis not present

## 2020-09-21 DIAGNOSIS — M542 Cervicalgia: Secondary | ICD-10-CM | POA: Diagnosis not present

## 2020-09-21 DIAGNOSIS — K219 Gastro-esophageal reflux disease without esophagitis: Secondary | ICD-10-CM | POA: Diagnosis not present

## 2020-09-21 DIAGNOSIS — W1839XD Other fall on same level, subsequent encounter: Secondary | ICD-10-CM | POA: Diagnosis not present

## 2020-09-21 DIAGNOSIS — R262 Difficulty in walking, not elsewhere classified: Secondary | ICD-10-CM | POA: Diagnosis not present

## 2020-09-21 DIAGNOSIS — S82851D Displaced trimalleolar fracture of right lower leg, subsequent encounter for closed fracture with routine healing: Secondary | ICD-10-CM | POA: Diagnosis not present

## 2020-09-21 DIAGNOSIS — M6281 Muscle weakness (generalized): Secondary | ICD-10-CM | POA: Diagnosis not present

## 2020-09-23 DIAGNOSIS — K219 Gastro-esophageal reflux disease without esophagitis: Secondary | ICD-10-CM | POA: Diagnosis not present

## 2020-09-23 DIAGNOSIS — M6281 Muscle weakness (generalized): Secondary | ICD-10-CM | POA: Diagnosis not present

## 2020-09-23 DIAGNOSIS — S82851D Displaced trimalleolar fracture of right lower leg, subsequent encounter for closed fracture with routine healing: Secondary | ICD-10-CM | POA: Diagnosis not present

## 2020-09-23 DIAGNOSIS — W1839XD Other fall on same level, subsequent encounter: Secondary | ICD-10-CM | POA: Diagnosis not present

## 2020-09-23 DIAGNOSIS — R262 Difficulty in walking, not elsewhere classified: Secondary | ICD-10-CM | POA: Diagnosis not present

## 2020-09-23 DIAGNOSIS — M542 Cervicalgia: Secondary | ICD-10-CM | POA: Diagnosis not present

## 2020-09-24 DIAGNOSIS — M25862 Other specified joint disorders, left knee: Secondary | ICD-10-CM | POA: Diagnosis not present

## 2020-09-24 DIAGNOSIS — I700 Atherosclerosis of aorta: Secondary | ICD-10-CM | POA: Diagnosis not present

## 2020-09-24 DIAGNOSIS — M1712 Unilateral primary osteoarthritis, left knee: Secondary | ICD-10-CM | POA: Diagnosis not present

## 2020-09-24 DIAGNOSIS — M25562 Pain in left knee: Secondary | ICD-10-CM | POA: Diagnosis not present

## 2020-09-25 DIAGNOSIS — M542 Cervicalgia: Secondary | ICD-10-CM | POA: Diagnosis not present

## 2020-09-25 DIAGNOSIS — M6281 Muscle weakness (generalized): Secondary | ICD-10-CM | POA: Diagnosis not present

## 2020-09-25 DIAGNOSIS — K219 Gastro-esophageal reflux disease without esophagitis: Secondary | ICD-10-CM | POA: Diagnosis not present

## 2020-09-25 DIAGNOSIS — S82851D Displaced trimalleolar fracture of right lower leg, subsequent encounter for closed fracture with routine healing: Secondary | ICD-10-CM | POA: Diagnosis not present

## 2020-09-25 DIAGNOSIS — R262 Difficulty in walking, not elsewhere classified: Secondary | ICD-10-CM | POA: Diagnosis not present

## 2020-09-25 DIAGNOSIS — W1839XD Other fall on same level, subsequent encounter: Secondary | ICD-10-CM | POA: Diagnosis not present

## 2020-09-29 DIAGNOSIS — W1839XD Other fall on same level, subsequent encounter: Secondary | ICD-10-CM | POA: Diagnosis not present

## 2020-09-29 DIAGNOSIS — K219 Gastro-esophageal reflux disease without esophagitis: Secondary | ICD-10-CM | POA: Diagnosis not present

## 2020-09-29 DIAGNOSIS — R262 Difficulty in walking, not elsewhere classified: Secondary | ICD-10-CM | POA: Diagnosis not present

## 2020-09-29 DIAGNOSIS — M542 Cervicalgia: Secondary | ICD-10-CM | POA: Diagnosis not present

## 2020-09-29 DIAGNOSIS — M6281 Muscle weakness (generalized): Secondary | ICD-10-CM | POA: Diagnosis not present

## 2020-09-29 DIAGNOSIS — S82851D Displaced trimalleolar fracture of right lower leg, subsequent encounter for closed fracture with routine healing: Secondary | ICD-10-CM | POA: Diagnosis not present

## 2020-09-30 DIAGNOSIS — M6281 Muscle weakness (generalized): Secondary | ICD-10-CM | POA: Diagnosis not present

## 2020-09-30 DIAGNOSIS — R278 Other lack of coordination: Secondary | ICD-10-CM | POA: Diagnosis not present

## 2020-09-30 DIAGNOSIS — I2510 Atherosclerotic heart disease of native coronary artery without angina pectoris: Secondary | ICD-10-CM | POA: Diagnosis not present

## 2020-09-30 DIAGNOSIS — K219 Gastro-esophageal reflux disease without esophagitis: Secondary | ICD-10-CM | POA: Diagnosis not present

## 2020-09-30 DIAGNOSIS — R262 Difficulty in walking, not elsewhere classified: Secondary | ICD-10-CM | POA: Diagnosis not present

## 2020-09-30 DIAGNOSIS — R4189 Other symptoms and signs involving cognitive functions and awareness: Secondary | ICD-10-CM | POA: Diagnosis not present

## 2020-09-30 DIAGNOSIS — S82851D Displaced trimalleolar fracture of right lower leg, subsequent encounter for closed fracture with routine healing: Secondary | ICD-10-CM | POA: Diagnosis not present

## 2020-09-30 DIAGNOSIS — E039 Hypothyroidism, unspecified: Secondary | ICD-10-CM | POA: Diagnosis not present

## 2020-09-30 DIAGNOSIS — W1839XD Other fall on same level, subsequent encounter: Secondary | ICD-10-CM | POA: Diagnosis not present

## 2020-09-30 DIAGNOSIS — Z741 Need for assistance with personal care: Secondary | ICD-10-CM | POA: Diagnosis not present

## 2020-09-30 DIAGNOSIS — M542 Cervicalgia: Secondary | ICD-10-CM | POA: Diagnosis not present

## 2020-09-30 DIAGNOSIS — K589 Irritable bowel syndrome without diarrhea: Secondary | ICD-10-CM | POA: Diagnosis not present

## 2020-09-30 DIAGNOSIS — F39 Unspecified mood [affective] disorder: Secondary | ICD-10-CM | POA: Diagnosis not present

## 2020-10-02 DIAGNOSIS — K219 Gastro-esophageal reflux disease without esophagitis: Secondary | ICD-10-CM | POA: Diagnosis not present

## 2020-10-02 DIAGNOSIS — W1839XD Other fall on same level, subsequent encounter: Secondary | ICD-10-CM | POA: Diagnosis not present

## 2020-10-02 DIAGNOSIS — R262 Difficulty in walking, not elsewhere classified: Secondary | ICD-10-CM | POA: Diagnosis not present

## 2020-10-02 DIAGNOSIS — S82851D Displaced trimalleolar fracture of right lower leg, subsequent encounter for closed fracture with routine healing: Secondary | ICD-10-CM | POA: Diagnosis not present

## 2020-10-02 DIAGNOSIS — M542 Cervicalgia: Secondary | ICD-10-CM | POA: Diagnosis not present

## 2020-10-02 DIAGNOSIS — M6281 Muscle weakness (generalized): Secondary | ICD-10-CM | POA: Diagnosis not present

## 2020-10-05 DIAGNOSIS — M542 Cervicalgia: Secondary | ICD-10-CM | POA: Diagnosis not present

## 2020-10-05 DIAGNOSIS — K219 Gastro-esophageal reflux disease without esophagitis: Secondary | ICD-10-CM | POA: Diagnosis not present

## 2020-10-05 DIAGNOSIS — W1839XD Other fall on same level, subsequent encounter: Secondary | ICD-10-CM | POA: Diagnosis not present

## 2020-10-05 DIAGNOSIS — M6281 Muscle weakness (generalized): Secondary | ICD-10-CM | POA: Diagnosis not present

## 2020-10-05 DIAGNOSIS — S82851D Displaced trimalleolar fracture of right lower leg, subsequent encounter for closed fracture with routine healing: Secondary | ICD-10-CM | POA: Diagnosis not present

## 2020-10-05 DIAGNOSIS — R262 Difficulty in walking, not elsewhere classified: Secondary | ICD-10-CM | POA: Diagnosis not present

## 2020-10-06 NOTE — Telephone Encounter (Signed)
Spoke to pt. Made appt with Dr Diona Browner for 10-09-20

## 2020-10-07 DIAGNOSIS — M25571 Pain in right ankle and joints of right foot: Secondary | ICD-10-CM | POA: Diagnosis not present

## 2020-10-07 DIAGNOSIS — S82851D Displaced trimalleolar fracture of right lower leg, subsequent encounter for closed fracture with routine healing: Secondary | ICD-10-CM | POA: Diagnosis not present

## 2020-10-07 DIAGNOSIS — M25471 Effusion, right ankle: Secondary | ICD-10-CM | POA: Diagnosis not present

## 2020-10-07 DIAGNOSIS — Z9181 History of falling: Secondary | ICD-10-CM | POA: Diagnosis not present

## 2020-10-07 DIAGNOSIS — M6281 Muscle weakness (generalized): Secondary | ICD-10-CM | POA: Diagnosis not present

## 2020-10-07 DIAGNOSIS — Q667 Congenital pes cavus, unspecified foot: Secondary | ICD-10-CM | POA: Diagnosis not present

## 2020-10-07 DIAGNOSIS — M542 Cervicalgia: Secondary | ICD-10-CM | POA: Diagnosis not present

## 2020-10-07 DIAGNOSIS — L603 Nail dystrophy: Secondary | ICD-10-CM | POA: Diagnosis not present

## 2020-10-07 DIAGNOSIS — W1839XD Other fall on same level, subsequent encounter: Secondary | ICD-10-CM | POA: Diagnosis not present

## 2020-10-07 DIAGNOSIS — R262 Difficulty in walking, not elsewhere classified: Secondary | ICD-10-CM | POA: Diagnosis not present

## 2020-10-07 DIAGNOSIS — S82841D Displaced bimalleolar fracture of right lower leg, subsequent encounter for closed fracture with routine healing: Secondary | ICD-10-CM | POA: Diagnosis not present

## 2020-10-07 DIAGNOSIS — K219 Gastro-esophageal reflux disease without esophagitis: Secondary | ICD-10-CM | POA: Diagnosis not present

## 2020-10-07 DIAGNOSIS — Z09 Encounter for follow-up examination after completed treatment for conditions other than malignant neoplasm: Secondary | ICD-10-CM | POA: Diagnosis not present

## 2020-10-09 ENCOUNTER — Encounter: Payer: Self-pay | Admitting: Family Medicine

## 2020-10-09 ENCOUNTER — Other Ambulatory Visit: Payer: Self-pay

## 2020-10-09 ENCOUNTER — Ambulatory Visit: Payer: Medicare Other | Admitting: Family Medicine

## 2020-10-09 ENCOUNTER — Ambulatory Visit (INDEPENDENT_AMBULATORY_CARE_PROVIDER_SITE_OTHER): Payer: Medicare Other | Admitting: Family Medicine

## 2020-10-09 VITALS — BP 124/78 | HR 96 | Temp 97.9°F | Ht 63.0 in | Wt 138.0 lb

## 2020-10-09 DIAGNOSIS — E039 Hypothyroidism, unspecified: Secondary | ICD-10-CM | POA: Diagnosis not present

## 2020-10-09 DIAGNOSIS — S82851D Displaced trimalleolar fracture of right lower leg, subsequent encounter for closed fracture with routine healing: Secondary | ICD-10-CM | POA: Diagnosis not present

## 2020-10-09 DIAGNOSIS — H539 Unspecified visual disturbance: Secondary | ICD-10-CM | POA: Insufficient documentation

## 2020-10-09 DIAGNOSIS — R262 Difficulty in walking, not elsewhere classified: Secondary | ICD-10-CM | POA: Diagnosis not present

## 2020-10-09 DIAGNOSIS — W1839XD Other fall on same level, subsequent encounter: Secondary | ICD-10-CM | POA: Diagnosis not present

## 2020-10-09 DIAGNOSIS — M542 Cervicalgia: Secondary | ICD-10-CM | POA: Diagnosis not present

## 2020-10-09 DIAGNOSIS — K219 Gastro-esophageal reflux disease without esophagitis: Secondary | ICD-10-CM | POA: Diagnosis not present

## 2020-10-09 DIAGNOSIS — I2510 Atherosclerotic heart disease of native coronary artery without angina pectoris: Secondary | ICD-10-CM

## 2020-10-09 DIAGNOSIS — R42 Dizziness and giddiness: Secondary | ICD-10-CM | POA: Diagnosis not present

## 2020-10-09 DIAGNOSIS — M6281 Muscle weakness (generalized): Secondary | ICD-10-CM | POA: Diagnosis not present

## 2020-10-09 LAB — CBC WITH DIFFERENTIAL/PLATELET
Basophils Absolute: 0 10*3/uL (ref 0.0–0.1)
Basophils Relative: 0.8 % (ref 0.0–3.0)
Eosinophils Absolute: 0.2 10*3/uL (ref 0.0–0.7)
Eosinophils Relative: 3.3 % (ref 0.0–5.0)
HCT: 45.6 % (ref 36.0–46.0)
Hemoglobin: 15.1 g/dL — ABNORMAL HIGH (ref 12.0–15.0)
Lymphocytes Relative: 24.4 % (ref 12.0–46.0)
Lymphs Abs: 1.2 10*3/uL (ref 0.7–4.0)
MCHC: 33.1 g/dL (ref 30.0–36.0)
MCV: 93.2 fl (ref 78.0–100.0)
Monocytes Absolute: 0.5 10*3/uL (ref 0.1–1.0)
Monocytes Relative: 10.2 % (ref 3.0–12.0)
Neutro Abs: 3 10*3/uL (ref 1.4–7.7)
Neutrophils Relative %: 61.3 % (ref 43.0–77.0)
Platelets: 212 10*3/uL (ref 150.0–400.0)
RBC: 4.9 Mil/uL (ref 3.87–5.11)
RDW: 13.8 % (ref 11.5–15.5)
WBC: 4.9 10*3/uL (ref 4.0–10.5)

## 2020-10-09 LAB — BASIC METABOLIC PANEL
BUN: 19 mg/dL (ref 6–23)
CO2: 25 mEq/L (ref 19–32)
Calcium: 9.4 mg/dL (ref 8.4–10.5)
Chloride: 107 mEq/L (ref 96–112)
Creatinine, Ser: 0.66 mg/dL (ref 0.40–1.20)
GFR: 79.89 mL/min (ref >60.00)
Glucose, Bld: 80 mg/dL (ref 70–99)
Potassium: 4.4 mEq/L (ref 3.5–5.1)
Sodium: 141 mEq/L (ref 135–145)

## 2020-10-09 LAB — TSH: TSH: 4.39 u[IU]/mL (ref 0.35–4.50)

## 2020-10-09 NOTE — Progress Notes (Signed)
Patient ID: Ariel Phillips, female    DOB: 1932-11-01, 87 y.o.   MRN: [id number]  This visit was conducted in person.  BP 124/78   Pulse 96   Temp 97.9 F (36.6 C) (Temporal)   Ht 5\' 3"  (1.6 m)   Wt 138 lb (62.6 kg)   SpO2 98%   BMI 24.45 kg/m   No data found.   CC: vertigo Subjective:   HPI: Ariel Phillips is a 87 y.o. female presenting on 10/09/2020 for Dizziness (C/o vertigo episode about 1 mo ago.  )   She lives at [facility].   Noted episodes of mild vertigo described as room spinning dizziness if she sat up too fast in bed - ongoing for several months now. That has since improved. 2 wks ago acute worse episode of vision change during a trip to visit her daughter in [place] - describes fractured vision "like kaleidoscope" that lasted 10 seconds and improved with closing eyes - unclear if vertigo.  H/o L eye loss of vision.  Overdue for eye doctor appointment - at [facility] ([place]).   No associated hearing changes, ringing in ears, unilateral numbness/weakness, headache, dyspnea, palpitations. No syncope, presyncope.  She wears hearing aides.   Broke ankle in February. Has been released to wear regular shoes. ORIF surgery 06/2020 with residual hardware. Notes sore to R lateral heel. Also notes purplish hue to right lower leg/foot     Relevant past medical, surgical, family and social history reviewed and updated as indicated. Interim medical history since our last visit reviewed. Allergies and medications reviewed and updated. Outpatient Medications Prior to Visit  Medication Sig Dispense Refill   alum & mag hydroxide-simeth (MAALOX PLUS) 400-400-40 MG/5ML suspension Take 10 mLs by mouth as needed for indigestion.     aspirin EC 81 MG tablet Take 1 tablet (81 mg total) by mouth daily.     carboxymethylcellulose (REFRESH PLUS) 0.5 % SOLN Place 2 drops into both eyes daily as needed (dry eyes).      diclofenac Sodium (VOLTAREN) 1 % GEL Apply 2 g topically 4  (four) times daily as needed. 150 g 5   ibuprofen (ADVIL) 200 MG tablet Take 200 mg by mouth every 6 (six) hours as needed.     levothyroxine (SYNTHROID) 75 MCG tablet TAKE 1 TABLET EVERY DAY ON EMPTY STOMACHWITH A GLASS OF WATER AT LEAST 30-60 MINBEFORE BREAKFAST 90 tablet 3   loratadine (CLARITIN) 10 MG tablet Take 10 mg by mouth at bedtime.      vitamin B-12 (CYANOCOBALAMIN) 100 MCG tablet Take 100 mcg by mouth daily.     enoxaparin (LOVENOX) 40 MG/0.4ML injection Inject 0.4 mLs (40 mg total) into the skin daily. 0.4 mL 0   No facility-administered medications prior to visit.     Per HPI unless specifically indicated in ROS section below Review of Systems Objective:  BP 124/78   Pulse 96   Temp 97.9 F (36.6 C) (Temporal)   Ht 5\' 3"  (1.6 m)   Wt 138 lb (62.6 kg)   SpO2 98%   BMI 24.45 kg/m   Wt Readings from Last 3 Encounters:  10/09/20 138 lb (62.6 kg)  07/10/20 148 lb (67.1 kg)  06/26/20 152 lb (68.9 kg)      Physical Exam Vitals and nursing note reviewed.  Constitutional:      Appearance: Normal appearance. She is not ill-appearing.  Eyes:     Extraocular Movements: Extraocular movements intact.  Conjunctiva/sclera: Conjunctivae normal.     Pupils: Pupils are equal, round, and reactive to light.  Neck:     Vascular: No carotid bruit.  Cardiovascular:     Rate and Rhythm: Normal rate and regular rhythm.     Pulses: Normal pulses.     Heart sounds: Normal heart sounds. No murmur heard. Pulmonary:     Effort: Pulmonary effort is normal. No respiratory distress.     Breath sounds: Normal breath sounds. No wheezing or rhonchi.  Musculoskeletal:        General: Swelling (mild to R ankle) present. No tenderness.     Cervical back: Normal range of motion and neck supple. No rigidity.     Right lower leg: No edema.     Left lower leg: No edema.     Comments: R lateral ankle cleaned, no open sore present  Lymphadenopathy:     Cervical: No cervical adenopathy.   Skin:    General: Skin is warm and dry.     Findings: No rash.  Neurological:     General: No focal deficit present.     Mental Status: She is alert.     Cranial Nerves: No cranial nerve deficit.     Sensory: Sensation is intact.     Motor: Motor function is intact.     Coordination: Coordination is intact. Romberg sign negative.     Gait: Gait is intact.     Comments:  CN 2-12 intact FTN intact EOMI No pronator drift. Ambulates with walker  Psychiatric:        Mood and Affect: Mood normal.        Behavior: Behavior normal.      Assessment & Plan:  This visit occurred during the SARS-CoV-2 public health emergency.  Safety protocols were in place, including screening questions prior to the visit, additional usage of staff PPE, and extensive cleaning of exam room while observing appropriate contact time as indicated for disinfecting solutions.   Problem List Items Addressed This Visit     Hypothyroidism   Relevant Orders   TSH   Vertigo    Describes episodes of BPPV around time of her ankle surgery but which have now largely resolved. Will monitor.       Vision changes - Primary    Isolated episode that lasted 10 seconds, of unclear etiology. Doesn't describe true vertigo but rather transient vision change of "kaleidoscope vision". ?aura, no h/o migraine.  I did recommend she schedule eye exam with Dr Sandra Cockayne her eye doctor and update [place] with findings. To let us know if recurrent for head imaging. Pt agrees with plan.        Relevant Orders   TSH   CBC with Differential/Platelet   Basic metabolic panel     No orders of the defined types were placed in this encounter.  Orders Placed This Encounter  Procedures   TSH   CBC with Differential/Platelet   Basic metabolic panel     Patient Instructions  Labs today  Schedule eye doctor appointment.   If recurrent episode, let us know to consider head imaging.   Follow up plan: Return if symptoms worsen or fail  to improve.  Ria Bush, MD

## 2020-10-09 NOTE — Assessment & Plan Note (Signed)
Describes episodes of BPPV around time of her ankle surgery but which have now largely resolved. Will monitor.

## 2020-10-09 NOTE — Assessment & Plan Note (Signed)
Isolated episode that lasted 10 seconds, of unclear etiology. Doesn't describe true vertigo but rather transient vision change of "kaleidoscope vision". ?aura, no h/o migraine.  I did recommend she schedule eye exam with Dr Sandra Cockayne her eye doctor and update [place] with findings. To let us know if recurrent for head imaging. Pt agrees with plan.

## 2020-10-09 NOTE — Patient Instructions (Signed)
Labs today  Schedule eye doctor appointment.

## 2020-10-12 DIAGNOSIS — S82851D Displaced trimalleolar fracture of right lower leg, subsequent encounter for closed fracture with routine healing: Secondary | ICD-10-CM | POA: Diagnosis not present

## 2020-10-12 DIAGNOSIS — M6281 Muscle weakness (generalized): Secondary | ICD-10-CM | POA: Diagnosis not present

## 2020-10-12 DIAGNOSIS — K219 Gastro-esophageal reflux disease without esophagitis: Secondary | ICD-10-CM | POA: Diagnosis not present

## 2020-10-12 DIAGNOSIS — R262 Difficulty in walking, not elsewhere classified: Secondary | ICD-10-CM | POA: Diagnosis not present

## 2020-10-12 DIAGNOSIS — M542 Cervicalgia: Secondary | ICD-10-CM | POA: Diagnosis not present

## 2020-10-12 DIAGNOSIS — W1839XD Other fall on same level, subsequent encounter: Secondary | ICD-10-CM | POA: Diagnosis not present

## 2020-10-14 DIAGNOSIS — M6281 Muscle weakness (generalized): Secondary | ICD-10-CM | POA: Diagnosis not present

## 2020-10-14 DIAGNOSIS — W1839XD Other fall on same level, subsequent encounter: Secondary | ICD-10-CM | POA: Diagnosis not present

## 2020-10-14 DIAGNOSIS — M542 Cervicalgia: Secondary | ICD-10-CM | POA: Diagnosis not present

## 2020-10-14 DIAGNOSIS — K219 Gastro-esophageal reflux disease without esophagitis: Secondary | ICD-10-CM | POA: Diagnosis not present

## 2020-10-14 DIAGNOSIS — R262 Difficulty in walking, not elsewhere classified: Secondary | ICD-10-CM | POA: Diagnosis not present

## 2020-10-14 DIAGNOSIS — S82851D Displaced trimalleolar fracture of right lower leg, subsequent encounter for closed fracture with routine healing: Secondary | ICD-10-CM | POA: Diagnosis not present

## 2020-11-03 ENCOUNTER — Telehealth: Payer: Self-pay | Admitting: Internal Medicine

## 2020-11-03 NOTE — Telephone Encounter (Signed)
Mrs. Flener called in wanted to know if Dr. Silvio Pate can write an order for more PT due to  left leg comlapping without bracing herself. And they can fax the order to [phone] - [facility]

## 2020-11-04 NOTE — Telephone Encounter (Signed)
I have faxed the order to [facility]

## 2020-11-04 NOTE — Telephone Encounter (Signed)
?  collapsing??  Okay---I wrote Rx--please fax it to [facility] at [facility]

## 2020-11-10 ENCOUNTER — Other Ambulatory Visit: Payer: Self-pay | Admitting: Internal Medicine

## 2020-11-10 DIAGNOSIS — G3184 Mild cognitive impairment, so stated: Secondary | ICD-10-CM | POA: Diagnosis not present

## 2020-11-10 DIAGNOSIS — M8580 Other specified disorders of bone density and structure, unspecified site: Secondary | ICD-10-CM | POA: Diagnosis not present

## 2020-11-10 DIAGNOSIS — M6281 Muscle weakness (generalized): Secondary | ICD-10-CM | POA: Diagnosis not present

## 2020-11-10 DIAGNOSIS — W1839XD Other fall on same level, subsequent encounter: Secondary | ICD-10-CM | POA: Diagnosis not present

## 2020-11-10 DIAGNOSIS — R4189 Other symptoms and signs involving cognitive functions and awareness: Secondary | ICD-10-CM | POA: Diagnosis not present

## 2020-11-16 DIAGNOSIS — M6281 Muscle weakness (generalized): Secondary | ICD-10-CM | POA: Diagnosis not present

## 2020-11-16 DIAGNOSIS — M8580 Other specified disorders of bone density and structure, unspecified site: Secondary | ICD-10-CM | POA: Diagnosis not present

## 2020-11-16 DIAGNOSIS — W1839XD Other fall on same level, subsequent encounter: Secondary | ICD-10-CM | POA: Diagnosis not present

## 2020-11-16 DIAGNOSIS — G3184 Mild cognitive impairment, so stated: Secondary | ICD-10-CM | POA: Diagnosis not present

## 2020-11-16 DIAGNOSIS — R4189 Other symptoms and signs involving cognitive functions and awareness: Secondary | ICD-10-CM | POA: Diagnosis not present

## 2020-11-18 DIAGNOSIS — G3184 Mild cognitive impairment, so stated: Secondary | ICD-10-CM | POA: Diagnosis not present

## 2020-11-18 DIAGNOSIS — M8580 Other specified disorders of bone density and structure, unspecified site: Secondary | ICD-10-CM | POA: Diagnosis not present

## 2020-11-18 DIAGNOSIS — M6281 Muscle weakness (generalized): Secondary | ICD-10-CM | POA: Diagnosis not present

## 2020-11-18 DIAGNOSIS — W1839XD Other fall on same level, subsequent encounter: Secondary | ICD-10-CM | POA: Diagnosis not present

## 2020-11-18 DIAGNOSIS — R4189 Other symptoms and signs involving cognitive functions and awareness: Secondary | ICD-10-CM | POA: Diagnosis not present

## 2020-11-23 DIAGNOSIS — M6281 Muscle weakness (generalized): Secondary | ICD-10-CM | POA: Diagnosis not present

## 2020-11-23 DIAGNOSIS — M8580 Other specified disorders of bone density and structure, unspecified site: Secondary | ICD-10-CM | POA: Diagnosis not present

## 2020-11-23 DIAGNOSIS — W1839XD Other fall on same level, subsequent encounter: Secondary | ICD-10-CM | POA: Diagnosis not present

## 2020-11-23 DIAGNOSIS — R4189 Other symptoms and signs involving cognitive functions and awareness: Secondary | ICD-10-CM | POA: Diagnosis not present

## 2020-11-23 DIAGNOSIS — G3184 Mild cognitive impairment, so stated: Secondary | ICD-10-CM | POA: Diagnosis not present

## 2020-11-24 DIAGNOSIS — H2513 Age-related nuclear cataract, bilateral: Secondary | ICD-10-CM | POA: Diagnosis not present

## 2020-11-25 DIAGNOSIS — M8580 Other specified disorders of bone density and structure, unspecified site: Secondary | ICD-10-CM | POA: Diagnosis not present

## 2020-11-25 DIAGNOSIS — W1839XD Other fall on same level, subsequent encounter: Secondary | ICD-10-CM | POA: Diagnosis not present

## 2020-11-25 DIAGNOSIS — R4189 Other symptoms and signs involving cognitive functions and awareness: Secondary | ICD-10-CM | POA: Diagnosis not present

## 2020-11-25 DIAGNOSIS — M6281 Muscle weakness (generalized): Secondary | ICD-10-CM | POA: Diagnosis not present

## 2020-11-25 DIAGNOSIS — G3184 Mild cognitive impairment, so stated: Secondary | ICD-10-CM | POA: Diagnosis not present

## 2020-11-30 DIAGNOSIS — M8580 Other specified disorders of bone density and structure, unspecified site: Secondary | ICD-10-CM | POA: Diagnosis not present

## 2020-11-30 DIAGNOSIS — M6281 Muscle weakness (generalized): Secondary | ICD-10-CM | POA: Diagnosis not present

## 2020-11-30 DIAGNOSIS — G3184 Mild cognitive impairment, so stated: Secondary | ICD-10-CM | POA: Diagnosis not present

## 2020-11-30 DIAGNOSIS — R4189 Other symptoms and signs involving cognitive functions and awareness: Secondary | ICD-10-CM | POA: Diagnosis not present

## 2020-11-30 DIAGNOSIS — W1839XD Other fall on same level, subsequent encounter: Secondary | ICD-10-CM | POA: Diagnosis not present

## 2020-12-02 DIAGNOSIS — G3184 Mild cognitive impairment, so stated: Secondary | ICD-10-CM | POA: Diagnosis not present

## 2020-12-02 DIAGNOSIS — M8580 Other specified disorders of bone density and structure, unspecified site: Secondary | ICD-10-CM | POA: Diagnosis not present

## 2020-12-02 DIAGNOSIS — R4189 Other symptoms and signs involving cognitive functions and awareness: Secondary | ICD-10-CM | POA: Diagnosis not present

## 2020-12-02 DIAGNOSIS — W1839XD Other fall on same level, subsequent encounter: Secondary | ICD-10-CM | POA: Diagnosis not present

## 2020-12-02 DIAGNOSIS — M6281 Muscle weakness (generalized): Secondary | ICD-10-CM | POA: Diagnosis not present

## 2020-12-07 DIAGNOSIS — M8580 Other specified disorders of bone density and structure, unspecified site: Secondary | ICD-10-CM | POA: Diagnosis not present

## 2020-12-07 DIAGNOSIS — M6281 Muscle weakness (generalized): Secondary | ICD-10-CM | POA: Diagnosis not present

## 2020-12-07 DIAGNOSIS — W1839XD Other fall on same level, subsequent encounter: Secondary | ICD-10-CM | POA: Diagnosis not present

## 2020-12-07 DIAGNOSIS — G3184 Mild cognitive impairment, so stated: Secondary | ICD-10-CM | POA: Diagnosis not present

## 2020-12-07 DIAGNOSIS — R4189 Other symptoms and signs involving cognitive functions and awareness: Secondary | ICD-10-CM | POA: Diagnosis not present

## 2020-12-08 DIAGNOSIS — H903 Sensorineural hearing loss, bilateral: Secondary | ICD-10-CM | POA: Diagnosis not present

## 2020-12-09 DIAGNOSIS — G3184 Mild cognitive impairment, so stated: Secondary | ICD-10-CM | POA: Diagnosis not present

## 2020-12-09 DIAGNOSIS — W1839XD Other fall on same level, subsequent encounter: Secondary | ICD-10-CM | POA: Diagnosis not present

## 2020-12-09 DIAGNOSIS — M6281 Muscle weakness (generalized): Secondary | ICD-10-CM | POA: Diagnosis not present

## 2020-12-09 DIAGNOSIS — R4189 Other symptoms and signs involving cognitive functions and awareness: Secondary | ICD-10-CM | POA: Diagnosis not present

## 2020-12-09 DIAGNOSIS — M8580 Other specified disorders of bone density and structure, unspecified site: Secondary | ICD-10-CM | POA: Diagnosis not present

## 2020-12-14 DIAGNOSIS — R4189 Other symptoms and signs involving cognitive functions and awareness: Secondary | ICD-10-CM | POA: Diagnosis not present

## 2020-12-14 DIAGNOSIS — G3184 Mild cognitive impairment, so stated: Secondary | ICD-10-CM | POA: Diagnosis not present

## 2020-12-14 DIAGNOSIS — W1839XD Other fall on same level, subsequent encounter: Secondary | ICD-10-CM | POA: Diagnosis not present

## 2020-12-14 DIAGNOSIS — M8580 Other specified disorders of bone density and structure, unspecified site: Secondary | ICD-10-CM | POA: Diagnosis not present

## 2020-12-14 DIAGNOSIS — M6281 Muscle weakness (generalized): Secondary | ICD-10-CM | POA: Diagnosis not present

## 2020-12-21 DIAGNOSIS — M6281 Muscle weakness (generalized): Secondary | ICD-10-CM | POA: Diagnosis not present

## 2020-12-21 DIAGNOSIS — M8580 Other specified disorders of bone density and structure, unspecified site: Secondary | ICD-10-CM | POA: Diagnosis not present

## 2020-12-21 DIAGNOSIS — G3184 Mild cognitive impairment, so stated: Secondary | ICD-10-CM | POA: Diagnosis not present

## 2020-12-21 DIAGNOSIS — R4189 Other symptoms and signs involving cognitive functions and awareness: Secondary | ICD-10-CM | POA: Diagnosis not present

## 2020-12-21 DIAGNOSIS — W1839XD Other fall on same level, subsequent encounter: Secondary | ICD-10-CM | POA: Diagnosis not present

## 2020-12-24 DIAGNOSIS — D692 Other nonthrombocytopenic purpura: Secondary | ICD-10-CM | POA: Diagnosis not present

## 2020-12-24 DIAGNOSIS — Z0000 Encounter for general adult medical examination without abnormal findings: Secondary | ICD-10-CM | POA: Diagnosis not present

## 2020-12-28 DIAGNOSIS — H6123 Impacted cerumen, bilateral: Secondary | ICD-10-CM | POA: Diagnosis not present

## 2020-12-28 DIAGNOSIS — H903 Sensorineural hearing loss, bilateral: Secondary | ICD-10-CM | POA: Diagnosis not present

## 2020-12-31 ENCOUNTER — Telehealth: Payer: Self-pay

## 2020-12-31 NOTE — Telephone Encounter (Signed)
A refill request was faxed to the office for a refill of Xifaxan. The patient was last seen in June of 2021. Does she need to be seen before I send in refills?

## 2021-01-01 NOTE — Telephone Encounter (Signed)
Left message for patient to return phone call.  

## 2021-02-03 ENCOUNTER — Telehealth: Payer: Self-pay | Admitting: Gastroenterology

## 2021-02-03 ENCOUNTER — Ambulatory Visit (INDEPENDENT_AMBULATORY_CARE_PROVIDER_SITE_OTHER): Payer: Medicare Other | Admitting: Gastroenterology

## 2021-02-03 ENCOUNTER — Encounter: Payer: Self-pay | Admitting: Gastroenterology

## 2021-02-03 VITALS — BP 112/77 | HR 87 | Ht 64.0 in | Wt 144.2 lb

## 2021-02-03 DIAGNOSIS — K638219 Small intestinal bacterial overgrowth, unspecified: Secondary | ICD-10-CM | POA: Insufficient documentation

## 2021-02-03 DIAGNOSIS — K580 Irritable bowel syndrome with diarrhea: Secondary | ICD-10-CM | POA: Diagnosis not present

## 2021-02-03 DIAGNOSIS — K6389 Other specified diseases of intestine: Secondary | ICD-10-CM | POA: Diagnosis not present

## 2021-02-03 DIAGNOSIS — I2510 Atherosclerotic heart disease of native coronary artery without angina pectoris: Secondary | ICD-10-CM

## 2021-02-03 DIAGNOSIS — K529 Noninfective gastroenteritis and colitis, unspecified: Secondary | ICD-10-CM | POA: Insufficient documentation

## 2021-02-03 MED ORDER — RIFAXIMIN 550 MG PO TABS: 550.0000 mg | ORAL_TABLET | Freq: Three times a day (TID) | ORAL | 0 refills | Status: DC

## 2021-02-03 NOTE — Progress Notes (Signed)
02/03/2021 Ariel Phillips [id number] 10/23/1932   HISTORY OF PRESENT ILLNESS: This is an 87 year old female who is a patient of Dr. Celesta Aver.  She has issues with intermittent diarrhea thought to be IBS related, question SIBO versus bowel salt related from previous cholecystectomy.  She says the diarrhea is worse when she travels.  She has responded to courses of Xifaxan and has taken, it looks like probably, 4 courses now since April 2021.  She would like to have another course of that.  She has some family coming into town this weekend and she was hoping that the medication would help with her symptoms while they are here.   Past Medical History:  Diagnosis Date   Allergic asthma    Allergic rhinitis due to pollen    Allergy    GERD (gastroesophageal reflux disease)    IBS (irritable bowel syndrome)    Osteopenia    Unspecified hypothyroidism    Past Surgical History:  Procedure Laterality Date   ANAL FISSURE REPAIR  1950's   BASAL CELL CARCINOMA EXCISION  2000's   nose   CHOLECYSTECTOMY  2008   COLONOSCOPY  Multiple   Negative screening exams   ENDOSCOPIC RETROGRADE CHOLANGIOPANCREATOGRAPHY (ERCP) WITH PROPOFOL N/A 02/13/2019   Procedure: ENDOSCOPIC RETROGRADE CHOLANGIOPANCREATOGRAPHY (ERCP) WITH PROPOFOL;  Surgeon: Gatha Mayer, MD;  [facility];  Service: Endoscopy;  Laterality: N/A;   [place] or so   with fissure repair   ORIF ANKLE FRACTURE Right 06/27/2020   Procedure: OPEN REDUCTION INTERNAL FIXATION (ORIF) ANKLE FRACTURE;  Surgeon: Caroline More, DPM;  Location: [facility];  Service: Podiatry;  Laterality: Right;   REMOVAL OF STONES  02/13/2019   Procedure: REMOVAL OF STONES;  Surgeon: Gatha Mayer, MD;  [facility];  Service: Endoscopy;;   SPHINCTEROTOMY  02/13/2019   Procedure: [name];  Surgeon: Gatha Mayer, MD;  [facility];  Service: Endoscopy;;   TUBAL LIGATION      reports that she has  never smoked. She has never used smokeless tobacco. She reports current alcohol use. She reports that she does not use drugs. family history includes COPD in her mother; Cancer in her mother and paternal grandmother; Diabetes in her sister; Hypertension in her sister; Stroke in an other family member; Transient ischemic attack in her mother. Allergies  Allergen Reactions   Penicillins Diarrhea    Did it involve swelling of the face/tongue/throat, SOB, or low BP? No Did it involve sudden or severe rash/hives, skin peeling, or any reaction on the inside of your mouth or nose? No Did you need to seek medical attention at a hospital or doctor's office? Yes When did it last happen?      20+ years If all above answers are "NO", may proceed with cephalosporin use.    Latex Rash      Outpatient Encounter Medications as of 02/03/2021  Medication Sig   alum & mag hydroxide-simeth (MAALOX PLUS) 400-400-40 MG/5ML suspension Take 10 mLs by mouth as needed for indigestion.   aspirin EC 81 MG tablet Take 1 tablet (81 mg total) by mouth daily.   carboxymethylcellulose (REFRESH PLUS) 0.5 % SOLN Place 2 drops into both eyes daily as needed (dry eyes).    diclofenac Sodium (VOLTAREN) 1 % GEL Apply 2 g topically 4 (four) times daily as needed.   ibuprofen (ADVIL) 200 MG tablet Take 200 mg by mouth every 6 (six) hours as needed.   levothyroxine (SYNTHROID) 75 MCG  tablet TAKE 1 TABLET EVERY DAY ON EMPTY STOMACHWITH A GLASS OF WATER AT LEAST 30-60 MINBEFORE BREAKFAST   loratadine (CLARITIN) 10 MG tablet Take 10 mg by mouth at bedtime.    vitamin B-12 (CYANOCOBALAMIN) 100 MCG tablet Take 100 mcg by mouth daily.   [DISCONTINUED] enoxaparin (LOVENOX) 40 MG/0.4ML injection Inject 0.4 mLs (40 mg total) into the skin daily.   No facility-administered encounter medications on file as of 02/03/2021.     REVIEW OF SYSTEMS  : All other systems reviewed and negative except where noted in the History of Present  Illness.   PHYSICAL EXAM: BP 112/77   Pulse 87   Ht 5\' 4"  (1.626 m)   Wt 144 lb 3.2 oz (65.4 kg)   SpO2 98%   BMI 24.75 kg/m  General: Well developed white female in no acute distress Head: Normocephalic and atraumatic Eyes:  Sclerae anicteric, conjunctiva pink. Ears: Normal auditory acuity Lungs: Clear throughout to auscultation; no W/R/R. Heart: Regular rate and rhythm; no M/R/G. Abdomen: Soft, non-distended.  BS present.  Mild RUQ TTP. Musculoskeletal: Symmetrical with no gross deformities  Skin: No lesions on visible extremities Extremities: No edema  Neurological: Alert oriented x 4, grossly non-focal Psychological:  Alert and cooperative. Normal mood and affect  ASSESSMENT AND PLAN: *87 year old female with issues with intermittent diarrhea.  IBS/suspected SIBO versus bile salt related from previous cholecystectomy.  She has responded to [facility] and it looks like maybe has taken 4 courses since April 2021.  She would like to have that prescription renewed and complete another course.  Dr. Carlean Purl had mentioned previously about having her do SIBO/lactulose breath test.  She is agreeable to do that, but then she is wondering if she could she could complete a course since she is having family coming to town and is hoping it will help with her symptoms while they are here.  I think it will help to have those results for future reference.  She will complete the test then we will start her Xifaxan following that.  Prescription sent to pharmacy.   CC:  Venia Carbon, MD

## 2021-02-03 NOTE — Telephone Encounter (Signed)
Do you have any thoughts?

## 2021-02-03 NOTE — Telephone Encounter (Signed)
Pt. Called.  She saw Jessica today and was given a kit to do.  Instructions said to take sample 14 days after last bout of diarrhea. Pt. Says she has IBS so has diarrhea all the time.  Wants to know how to proceed with kit.  Please call and advise.  Thank you. 

## 2021-02-03 NOTE — Progress Notes (Signed)
SIBO ORDER:  Company: [employer] faxed: Patient demographics, copy of insurance card, SIBO (Lactulose) order  All information faxed successfully to [facility]. Document(s) and fax confirmation(s) will be held for future reference.

## 2021-02-03 NOTE — Telephone Encounter (Signed)
Returned pt call. Advised to proceed with test as we have ordered as this has not been a concern in the past with other patients with chronic loose stools, not has their results been altered in any manner as a result. Verbalized acceptance and understanding.

## 2021-02-03 NOTE — Patient Instructions (Addendum)
It was my pleasure to provide care to you today. Based on our discussion, I am providing you with my recommendations below:  RECOMMENDATION(S):   PRESCRIPTION MEDICATION(S):   We have sent the following medication(s) to your pharmacy:  Xifaxan - Take this medication after completing the SIBO test  NOTE: If your medication(s) requires a PRIOR AUTHORIZATION, we will receive notification from your pharmacy. Once received, the process to submit for approval may take up to 7-10 business days. You will be contacted about any denials we have received from your insurance company as well as alternatives recommended by your provider.   SMALL INTESTINE BACTERIAL OVERGROWTH (SIBO):   I am providing you with a handout that further explains the rationale for this test, preparation prior to completion of the test and how to perform the test. My staff will send your order, demographics and insurance information to [facility]. This company will contact you to verify your information and to send you the SIBO kit. Please follow [facility] instructions regarding collection and submission of your sample. If you have any further questions or concerns, please call [facility] at [phone].  SIBO RESULTS:  Once you have submitted your specimen to [facility] for processing, it will take up to 7-10 business days for me to receive your results. Once I have reviewed your results, you will receive a call from my office to provide you with my recommendation(s).   FOLLOW UP:  I would like for you to follow up with me as needed. Please call the office at [phone] to schedule your appointment.  BMI:  If you are age 26 or older, your body mass index should be between 23-30. Your Body mass index is 24.75 kg/m. If this is out of the aforementioned range listed, please consider follow up with your Primary Care Provider.  MY CHART:  The [facility] providers would like to encourage you to use  [facility] to communicate with providers for non-urgent requests or questions.  Due to long hold times on the telephone, sending your provider a message by [facility] may be a faster and more efficient way to get a response.  Please allow 48 business hours for a response.  Please remember that this is for non-urgent requests.   Thank you for trusting me with your gastrointestinal care!    Alonza Bogus PA-C

## 2021-02-05 ENCOUNTER — Other Ambulatory Visit: Payer: Self-pay

## 2021-02-05 ENCOUNTER — Encounter: Payer: Self-pay | Admitting: Internal Medicine

## 2021-02-05 ENCOUNTER — Ambulatory Visit (INDEPENDENT_AMBULATORY_CARE_PROVIDER_SITE_OTHER): Payer: Medicare Other | Admitting: Internal Medicine

## 2021-02-05 VITALS — BP 104/70 | HR 89 | Temp 97.8°F | Ht 62.5 in | Wt 141.0 lb

## 2021-02-05 DIAGNOSIS — Z0000 Encounter for general adult medical examination without abnormal findings: Secondary | ICD-10-CM

## 2021-02-05 DIAGNOSIS — K219 Gastro-esophageal reflux disease without esophagitis: Secondary | ICD-10-CM

## 2021-02-05 DIAGNOSIS — Z23 Encounter for immunization: Secondary | ICD-10-CM

## 2021-02-05 DIAGNOSIS — I700 Atherosclerosis of aorta: Secondary | ICD-10-CM | POA: Diagnosis not present

## 2021-02-05 DIAGNOSIS — F39 Unspecified mood [affective] disorder: Secondary | ICD-10-CM

## 2021-02-05 DIAGNOSIS — Z7189 Other specified counseling: Secondary | ICD-10-CM

## 2021-02-05 DIAGNOSIS — I2510 Atherosclerotic heart disease of native coronary artery without angina pectoris: Secondary | ICD-10-CM

## 2021-02-05 DIAGNOSIS — K580 Irritable bowel syndrome with diarrhea: Secondary | ICD-10-CM | POA: Diagnosis not present

## 2021-02-05 NOTE — Assessment & Plan Note (Signed)
Calcium on CT--no clear symptoms

## 2021-02-05 NOTE — Progress Notes (Signed)
Subjective:    Patient ID: Ariel Phillips, female    DOB: October 15, 1932, 87 y.o.   MRN: [id number]  HPI Here for Medicare wellness visit and follow up of chronic health conditions This visit occurred during the SARS-CoV-2 public health emergency.  Safety protocols were in place, including screening questions prior to the visit, additional usage of staff PPE, and extensive cleaning of exam room while observing appropriate contact time as indicated for disinfecting solutions.   Reviewed advanced directives Reviewed other doctors---Dr Gessner/[name]--GI, Dr Myrtice Lauth. Drs Agustina Caroli, Dr Jonna Clark, Dr Pelletier--audiology, Dr Agbor-Etang-cardiology, Dr Morrison Old,  Eye,Dr Touloupas--dentist Only hospitalization was for the ankle fracture Vision issues--has ocular migraines. Only 1 working eye (very limited in left eye) Hearing aides do help Rare alcohol--wine if out for dinner No tobacco Is exercising regularly Fell walking across car wash Vague depressed mood from time to time. No anhedonic Independent with instrumental ADLs Feels her memory is worse--blanking on names, etc  Has recovered from the ankle fracture surgery by Dr Luana Shu in February No brace Back to walking and other exercise  Ongoing issues with GI Hoping to get xifaxan again before upcoming travel No blood  Not keeping up with cardiology No chest pain or SOB Occasional mild orthostatic dizziness. No syncope No edema No palpitations  Known aortic atherosclerosis and CAD (calcium) No statin per cardiologist  Current Outpatient Medications on File Prior to Visit  Medication Sig Dispense Refill   alum & mag hydroxide-simeth (MAALOX PLUS) 400-400-40 MG/5ML suspension Take 10 mLs by mouth as needed for indigestion.     aspirin EC 81 MG tablet Take 1 tablet (81 mg total) by mouth daily.     carboxymethylcellulose (REFRESH PLUS) 0.5 % SOLN Place 2 drops into both eyes daily as needed (dry  eyes).      diclofenac Sodium (VOLTAREN) 1 % GEL Apply 2 g topically 4 (four) times daily as needed. 150 g 5   ibuprofen (ADVIL) 200 MG tablet Take 200 mg by mouth every 6 (six) hours as needed.     levothyroxine (SYNTHROID) 75 MCG tablet TAKE 1 TABLET EVERY DAY ON EMPTY STOMACHWITH A GLASS OF WATER AT LEAST 30-60 MINBEFORE BREAKFAST 90 tablet 3   loratadine (CLARITIN) 10 MG tablet Take 10 mg by mouth at bedtime.      omeprazole (PRILOSEC) 20 MG capsule Take 20 mg by mouth at bedtime.     vitamin B-12 (CYANOCOBALAMIN) 100 MCG tablet Take 100 mcg by mouth daily.     rifaximin (XIFAXAN) 550 MG TABS tablet Take 1 tablet (550 mg total) by mouth 3 (three) times daily. (Patient not taking: Reported on 02/05/2021) 42 tablet 0   No current facility-administered medications on file prior to visit.    Allergies  Allergen Reactions   Penicillins Diarrhea    Did it involve swelling of the face/tongue/throat, SOB, or low BP? No Did it involve sudden or severe rash/hives, skin peeling, or any reaction on the inside of your mouth or nose? No Did you need to seek medical attention at a hospital or doctor's office? Yes When did it last happen?      20+ years If all above answers are "NO", may proceed with cephalosporin use.    Latex Rash    Past Medical History:  Diagnosis Date   Allergic asthma    Allergic rhinitis due to pollen    Allergy    GERD (gastroesophageal reflux disease)    IBS (irritable bowel syndrome)    Osteopenia  Unspecified hypothyroidism     Past Surgical History:  Procedure Laterality Date   ANAL FISSURE REPAIR  1950's   BASAL CELL CARCINOMA EXCISION  2000's   nose   CHOLECYSTECTOMY  2008   COLONOSCOPY  Multiple   Negative screening exams   ENDOSCOPIC RETROGRADE CHOLANGIOPANCREATOGRAPHY (ERCP) WITH PROPOFOL N/A 02/13/2019   Procedure: ENDOSCOPIC RETROGRADE CHOLANGIOPANCREATOGRAPHY (ERCP) WITH PROPOFOL;  Surgeon: Gatha Mayer, MD;  Location: [facility];  Service:  Endoscopy;  Laterality: N/A;   [place] or so   with fissure repair   ORIF ANKLE FRACTURE Right 06/27/2020   Procedure: OPEN REDUCTION INTERNAL FIXATION (ORIF) ANKLE FRACTURE;  Surgeon: Caroline More, DPM;  Location: [facility];  Service: Podiatry;  Laterality: Right;   REMOVAL OF STONES  02/13/2019   Procedure: REMOVAL OF STONES;  Surgeon: Gatha Mayer, MD;  [facility]: [facility];  Service: Endoscopy;;   SPHINCTEROTOMY  02/13/2019   Procedure: [name];  Surgeon: Gatha Mayer, MD;  [facility]: [facility];  Service: Endoscopy;;   TUBAL LIGATION      Family History  Problem Relation Age of Onset   COPD Mother    Cancer Mother        unclear diagnosis   Transient ischemic attack Mother    Diabetes Sister    Hypertension Sister    Stroke Other    Cancer Paternal Grandmother        ?breast   Heart disease Neg Hx     Social History   Socioeconomic History   Marital status: Divorced    Spouse name: Not on file   Number of children: 1   Years of education: Not on file   Highest education level: Not on file  Occupational History   Occupation: Retired historian--consultant  Tobacco Use   Smoking status: Never   Smokeless tobacco: Never  Vaping Use   Vaping Use: Never used  Substance and Sexual Activity   Alcohol use: Yes    Comment: rare wine   Drug use: No   Sexual activity: Not Currently  Other Topics Concern   Not on file  Social History Narrative   Divorced and retired , moved from [place], [place]   1 Daughter in [place]   Has living will   Daughter has health care POA.---[name]   Requests DNR--order done 06/19/12   Would not want prolonged mechanical ventilation   May accept tube feeds temporarily but not if cognitively unaware   Social Determinants of Health   Financial Resource Strain: Not on file  Food Insecurity: Not on file  Transportation Needs: Not on file  Physical Activity: Not on file  Stress: Not on file   Social Connections: Not on file  Intimate Partner Violence: Not on file   Review of Systems Appetite is fine Weight down slightly Wears seat belt Sleeps well Teeth okay---keeps up with dentist No suspicious skin lesions No heartburn. Omeprazole prn only ---hasn't needed. No dysphagia Will have occasional left hip aching when standing up    Objective:   Physical Exam Constitutional:      Appearance: Normal appearance.  HENT:     Mouth/Throat:     Comments: No lesions Eyes:     Conjunctiva/sclera: Conjunctivae normal.  Cardiovascular:     Rate and Rhythm: Normal rate and regular rhythm.     Heart sounds: No murmur heard.   No gallop.  Pulmonary:     Effort: Pulmonary effort is normal.     Breath sounds: Normal  breath sounds. No wheezing or rales.  Abdominal:     Palpations: Abdomen is soft.     Tenderness: There is no abdominal tenderness.  Musculoskeletal:     Cervical back: Neck supple.     Right lower leg: No edema.     Left lower leg: No edema.     Comments: Normal ROM in hips  Lymphadenopathy:     Cervical: No cervical adenopathy.  Skin:    Findings: No lesion or rash.  Neurological:     Mental Status: She is alert and oriented to person, place, and time.     Comments: President---"Joe Balinda Quails Obama" [phone] [id number] Recall 3/3  Psychiatric:        Mood and Affect: Mood normal.        Behavior: Behavior normal.           Assessment & Plan:

## 2021-02-05 NOTE — Assessment & Plan Note (Signed)
Ongoing symptoms xifaxan really helps---hoping to use before upcoming trip to [place]

## 2021-02-05 NOTE — Assessment & Plan Note (Signed)
I have personally reviewed the Medicare Annual Wellness questionnaire and have noted 1. The patient's medical and social history 2. Their use of alcohol, tobacco or illicit drugs 3. Their current medications and supplements 4. The patient's functional ability including ADL's, fall risks, home safety risks and hearing or visual             impairment. 5. Diet and physical activities 6. Evidence for depression or mood disorders  The patients weight, height, BMI and visual acuity have been recorded in the chart I have made referrals, counseling and provided education to the patient based review of the above and I have provided the pt with a written personalized care plan for preventive services.  I have provided you with a copy of your personalized plan for preventive services. Please take the time to review along with your updated medication list.  No cancer screening Had bivalent COVID vaccine Flu vaccine today Does exercise regularlly

## 2021-02-05 NOTE — Assessment & Plan Note (Signed)
Mild dysthymia which is stable Not anhedonic Medications not indicated

## 2021-02-05 NOTE — Assessment & Plan Note (Signed)
Only needs omeprazole prn

## 2021-02-05 NOTE — Assessment & Plan Note (Signed)
Has DNR 

## 2021-02-05 NOTE — Assessment & Plan Note (Signed)
On imaging No statin after review

## 2021-02-05 NOTE — Addendum Note (Signed)
Addended by: Pilar Grammes on: 02/05/2021 12:52 PM   Modules accepted: Orders

## 2021-02-09 ENCOUNTER — Telehealth: Payer: Self-pay | Admitting: Gastroenterology

## 2021-02-09 NOTE — Telephone Encounter (Signed)
Pt called to advise that she has a new pharmacy [facility] in [place], phone [phone]. Fax is [phone]. She stated that prescription for Xifaxan was sent to her old pharmacy [facility]. She is also asking to speak with Janett Billow if possible about Sibo test. She stated that there are several steps that she has to complete and she is nervous about it. Pls call her.

## 2021-02-10 ENCOUNTER — Other Ambulatory Visit: Payer: Self-pay

## 2021-02-10 DIAGNOSIS — K580 Irritable bowel syndrome with diarrhea: Secondary | ICD-10-CM

## 2021-02-10 DIAGNOSIS — K6389 Other specified diseases of intestine: Secondary | ICD-10-CM

## 2021-02-10 DIAGNOSIS — K529 Noninfective gastroenteritis and colitis, unspecified: Secondary | ICD-10-CM

## 2021-02-10 MED ORDER — RIFAXIMIN 550 MG PO TABS: 550.0000 mg | ORAL_TABLET | Freq: Three times a day (TID) | ORAL | 0 refills | Status: DC

## 2021-02-10 NOTE — Telephone Encounter (Signed)
Returned pt call. Advised the Rx has been sent to [facility] per her request (see below). Pt denies having any questions or concerns about how to do the SIBO test. Merely  commented, "that's a lot of steps but I don't need help." No further action required.

## 2021-02-10 NOTE — Telephone Encounter (Signed)
Pharmacy  [place], [place] [facility] [address]  [place], [place] [address]  Phone:  [phone]  Fax:  [phone]    Outpatient Medication Detail   Disp Refills Start End   rifaximin (XIFAXAN) 550 MG TABS tablet 42 tablet 0 02/10/2021    Sig - Route: Take 1 tablet (550 mg total) by mouth 3 (three) times daily. - Oral   Sent to pharmacy as: rifaximin (XIFAXAN) 550 MG Tab tablet   E-Prescribing Status: Receipt confirmed by pharmacy (02/10/2021 11:22 AM EDT)

## 2021-02-10 NOTE — Telephone Encounter (Signed)
Can you call this patient since you were with Janett Billow this day. Thank you.

## 2021-02-11 ENCOUNTER — Other Ambulatory Visit: Payer: Self-pay | Admitting: Internal Medicine

## 2021-02-12 DIAGNOSIS — K580 Irritable bowel syndrome with diarrhea: Secondary | ICD-10-CM | POA: Diagnosis not present

## 2021-02-25 ENCOUNTER — Telehealth: Payer: Self-pay

## 2021-02-25 NOTE — Telephone Encounter (Signed)
-----   Message from Loralie Champagne, PA-C sent at 02/25/2021  3:42 PM EDT ----- Please let her know that her SIBO breath test results returned slightly abnormal, suspicious for bacterial overgrowth.  This just confirms that we have been treating her appropriately with the Xifaxan.  Please she how she did after this last course that she took.  Thank you,  Jess

## 2021-02-26 NOTE — Telephone Encounter (Signed)
The pt states she will complete the xifaxan as prescribed.  She does state that when she last had a course of xifaxan she began to get irritated around the anus.  She was told to call our office if she develops any issues.  The pt has been advised of the information and verbalized understanding.

## 2021-04-02 ENCOUNTER — Encounter: Payer: Self-pay | Admitting: Internal Medicine

## 2021-04-09 ENCOUNTER — Encounter: Payer: Self-pay | Admitting: Internal Medicine

## 2021-04-09 NOTE — Telephone Encounter (Signed)
Unable to reach pt and pts daughter; left v/m requesting pt to cb to [place]. Sending note to Dr Manning Charity CMA and [place] triage.Dr Silvio Pate is out of office and Eugenia Pancoast FNP has left for the day also. Will send note to Dr Damita Dunnings and Janett Billow CMA. And will teams [place].

## 2021-04-09 NOTE — Telephone Encounter (Signed)
Routed to PCP as FYI.

## 2021-04-09 NOTE — Telephone Encounter (Signed)
Unable to reach pt or Becky (DPR signed) on any contact #; left v/m for pt to cb. Sending note to [facility] triage and [facility] CMA.

## 2021-04-09 NOTE — Telephone Encounter (Signed)
Unable to reach pt or Becky by phone. Will wait for cb. Sending note to [place] triage and [facility] CMA.

## 2021-04-28 ENCOUNTER — Encounter: Payer: Self-pay | Admitting: Internal Medicine

## 2021-05-06 DIAGNOSIS — D2261 Melanocytic nevi of right upper limb, including shoulder: Secondary | ICD-10-CM | POA: Diagnosis not present

## 2021-05-06 DIAGNOSIS — L821 Other seborrheic keratosis: Secondary | ICD-10-CM | POA: Diagnosis not present

## 2021-05-06 DIAGNOSIS — D2272 Melanocytic nevi of left lower limb, including hip: Secondary | ICD-10-CM | POA: Diagnosis not present

## 2021-05-06 DIAGNOSIS — Z85828 Personal history of other malignant neoplasm of skin: Secondary | ICD-10-CM | POA: Diagnosis not present

## 2021-05-06 DIAGNOSIS — D2262 Melanocytic nevi of left upper limb, including shoulder: Secondary | ICD-10-CM | POA: Diagnosis not present

## 2021-05-10 ENCOUNTER — Ambulatory Visit (INDEPENDENT_AMBULATORY_CARE_PROVIDER_SITE_OTHER): Payer: Medicare Other | Admitting: Internal Medicine

## 2021-05-10 ENCOUNTER — Encounter: Payer: Self-pay | Admitting: *Deleted

## 2021-05-10 ENCOUNTER — Encounter: Payer: Self-pay | Admitting: Internal Medicine

## 2021-05-10 ENCOUNTER — Other Ambulatory Visit: Payer: Self-pay

## 2021-05-10 VITALS — BP 114/78 | HR 88 | Temp 97.7°F | Ht 63.0 in | Wt 144.0 lb

## 2021-05-10 DIAGNOSIS — H539 Unspecified visual disturbance: Secondary | ICD-10-CM | POA: Diagnosis not present

## 2021-05-10 DIAGNOSIS — R413 Other amnesia: Secondary | ICD-10-CM | POA: Diagnosis not present

## 2021-05-10 NOTE — Progress Notes (Signed)
Subjective:    Patient ID: Ariel Phillips, female    DOB: 06/02/1932, 88 y.o.   MRN: [id number]  HPI Here due to concerns about memory loss  Martin Majestic to [place] for Thanksgiving Sister reported that she was repeating herself Recommended that she get seen by neurologist  More recently, daughter told her that she forgot to get the neurology appointment She also noticed that she repeats herself within 10 minutes  Still drives Hasn't gotten lost Continues to do grocery shopping. 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     aspirin EC 81 MG tablet Take 1 tablet (81 mg total) by mouth daily.     carboxymethylcellulose (REFRESH PLUS) 0.5 % SOLN Place 2 drops into both eyes daily as needed (dry eyes).      diclofenac Sodium (VOLTAREN) 1 % GEL Apply 2 g topically 4 (four) times daily as needed. 150 g 5   ibuprofen (ADVIL) 200 MG tablet Take 200 mg by mouth every 6 (six) hours as needed.     levothyroxine (SYNTHROID) 75 MCG tablet TAKE 1 TABLET EVERY DAY ON EMPTY STOMACHWITH A GLASS OF WATER AT LEAST 30-60 MINBEFORE BREAKFAST 90 tablet 3   loratadine (CLARITIN) 10 MG tablet Take 10 mg by mouth at bedtime.      omeprazole (PRILOSEC) 20 MG capsule TAKE 1 CAPSULE BY MOUTH AT BEDTIME 90 capsule 3   rifaximin (XIFAXAN) 550 MG TABS tablet Take 1 tablet (550 mg total) by mouth 3 (three) times daily. (Patient taking differently: Take 550 mg by mouth 3 (three) times daily. Pt taking as needed.) 42 tablet 0   vitamin B-12 (CYANOCOBALAMIN) 100 MCG tablet Take 100 mcg by mouth  daily.     No current facility-administered medications on file prior to visit.    Allergies  Allergen Reactions   Penicillins Other (See Comments)    Did it involve swelling of the face/tongue/throat, SOB, or low BP? No Did it involve sudden or severe rash/hives, skin peeling, or any reaction on the inside of your mouth or nose? No Did you need to seek medical attention at a hospital or doctor's office? Yes When did it last happen?      20+ years If all above answers are "NO", may proceed with cephalosporin use.  Pt does not remember the reaction   Latex Rash    Past Medical History:  Diagnosis Date   Allergic asthma    Allergic rhinitis due to pollen    Allergy    GERD (gastroesophageal reflux disease)    IBS (irritable bowel syndrome)    Osteopenia    Unspecified hypothyroidism     Past Surgical History:  Procedure Laterality Date   ANAL FISSURE REPAIR  1950's   BASAL CELL CARCINOMA EXCISION  2000's   nose   CHOLECYSTECTOMY  2008   COLONOSCOPY  Multiple   Negative screening exams   ENDOSCOPIC RETROGRADE CHOLANGIOPANCREATOGRAPHY (ERCP) WITH PROPOFOL N/A 02/13/2019   Procedure: ENDOSCOPIC RETROGRADE CHOLANGIOPANCREATOGRAPHY (ERCP) WITH PROPOFOL;  Surgeon: Gatha Mayer, MD;  [facility];  Service: Endoscopy;  Laterality: N/A;   [place] or so   with fissure repair   ORIF ANKLE FRACTURE Right 06/27/2020   Procedure: OPEN REDUCTION INTERNAL FIXATION (ORIF) ANKLE FRACTURE;  Surgeon: Caroline More, DPM;  Location: [facility];  Service: Podiatry;  Laterality: Right;   REMOVAL OF STONES  02/13/2019   Procedure: REMOVAL OF STONES;  Surgeon: Gatha Mayer, MD;  [facility];  Service: Endoscopy;;   SPHINCTEROTOMY  02/13/2019   Procedure: [name];  Surgeon: Gatha Mayer, MD;  [facility];  Service: Endoscopy;;   TUBAL LIGATION      Family History  Problem Relation Age of Onset   COPD Mother    Cancer Mother         unclear diagnosis   Transient ischemic attack Mother    Diabetes Sister    Hypertension Sister    Stroke Other    Cancer Paternal Grandmother        ?breast   Heart disease Neg Hx     Social History   Socioeconomic History   Marital status: Divorced    Spouse name: Not on file   Number of children: 1   Years of education: Not on file   Highest education level: Not on file  Occupational History   Occupation: Retired historian--consultant  Tobacco Use   Smoking status: Never   Smokeless tobacco: Never  Vaping Use   Vaping Use: Never used  Substance and Sexual Activity   Alcohol use: Yes    Comment: rare wine   Drug use: No   Sexual activity: Not Currently  Other Topics Concern   Not on file  Social History Narrative   Divorced and retired , moved from [place], [place]   1 Daughter in [place]   Has living will   Daughter has health care POA.---[name]   Requests DNR--order done 06/19/12   Would not want prolonged mechanical ventilation   May accept tube feeds temporarily but not if cognitively unaware   Social Determinants of Health   Financial Resource Strain: Not on file  Food Insecurity: Not on file  Transportation Needs: Not on file  Physical Activity: Not on file  Stress: Not on file  Social Connections: Not on file  Intimate Partner Violence: Not on file   Review of Systems Not sleeping as well as in the past. Does nap at times Doesn't really feel tired Appetite is good---weight has gone up some Still has some pain in right ankle---fracture just about a year ago      Objective:   Physical Exam Constitutional:      Appearance: Normal appearance.  Skin:    Capillary Refill: Capillary refill takes less than 2 seconds.  Neurological:     Mental Status: She is alert.     Comments: MOCA---26 (form scanned)  Psychiatric:        Mood and Affect: Mood normal.        Behavior: Behavior normal.           Assessment & Plan:

## 2021-05-10 NOTE — Assessment & Plan Note (Signed)
She thinks it may have been an ocular migraine She is especially concerned due to monocular vision She is going to the eye doctor soon Will set up with neurology

## 2021-05-10 NOTE — Assessment & Plan Note (Signed)
She is unaware of this Has mostly been repeating herself MOCA 26---not al that bad (more like MCI) Will check MRI of brain and labs Will make referral to neurologist

## 2021-05-12 NOTE — Telephone Encounter (Signed)
Please advise, thanks.

## 2021-05-14 ENCOUNTER — Other Ambulatory Visit (INDEPENDENT_AMBULATORY_CARE_PROVIDER_SITE_OTHER): Payer: Medicare Other

## 2021-05-14 ENCOUNTER — Encounter: Payer: Self-pay | Admitting: *Deleted

## 2021-05-14 ENCOUNTER — Other Ambulatory Visit: Payer: Self-pay

## 2021-05-14 DIAGNOSIS — R413 Other amnesia: Secondary | ICD-10-CM | POA: Diagnosis not present

## 2021-05-14 LAB — CBC
HCT: 44.2 % (ref 36.0–46.0)
Hemoglobin: 14.4 g/dL (ref 12.0–15.0)
MCHC: 32.6 g/dL (ref 30.0–36.0)
MCV: 95.1 fl (ref 78.0–100.0)
Platelets: 232 10*3/uL (ref 150.0–400.0)
RBC: 4.65 Mil/uL (ref 3.87–5.11)
RDW: 13.5 % (ref 11.5–15.5)
WBC: 4.4 10*3/uL (ref 4.0–10.5)

## 2021-05-14 LAB — COMPREHENSIVE METABOLIC PANEL
ALT: 14 U/L (ref 0–35)
AST: 20 U/L (ref 0–37)
Albumin: 4.2 g/dL (ref 3.5–5.2)
Alkaline Phosphatase: 75 U/L (ref 39–117)
BUN: 17 mg/dL (ref 6–23)
CO2: 26 mEq/L (ref 19–32)
Calcium: 9.1 mg/dL (ref 8.4–10.5)
Chloride: 106 mEq/L (ref 96–112)
Creatinine, Ser: 0.71 mg/dL (ref 0.40–1.20)
GFR: 77.11 mL/min (ref >60.00)
Glucose, Bld: 83 mg/dL (ref 70–99)
Potassium: 4.5 mEq/L (ref 3.5–5.1)
Sodium: 140 mEq/L (ref 135–145)
Total Bilirubin: 0.4 mg/dL (ref 0.2–1.2)
Total Protein: 7.2 g/dL (ref 6.0–8.3)

## 2021-05-14 LAB — VITAMIN B12: Vitamin B-12: >1550 pg/mL — ABNORMAL HIGH (ref 211–911)

## 2021-05-14 LAB — T4, FREE: Free T4: 1 ng/dL (ref 0.60–1.60)

## 2021-05-14 LAB — SEDIMENTATION RATE: Sed Rate: 17 mm/hr (ref 0–30)

## 2021-05-17 DIAGNOSIS — H35371 Puckering of macula, right eye: Secondary | ICD-10-CM | POA: Diagnosis not present

## 2021-05-17 LAB — RPR: RPR Ser Ql: NONREACTIVE

## 2021-05-22 ENCOUNTER — Ambulatory Visit
Admission: RE | Admit: 2021-05-22 | Discharge: 2021-05-22 | Disposition: A | Discharge status: Home / Self Care | Payer: Medicare Other | Source: Ambulatory Visit | Attending: Internal Medicine | Admitting: Internal Medicine

## 2021-05-22 ENCOUNTER — Other Ambulatory Visit: Payer: Self-pay

## 2021-05-22 DIAGNOSIS — R413 Other amnesia: Secondary | ICD-10-CM | POA: Diagnosis not present

## 2021-05-22 DIAGNOSIS — R42 Dizziness and giddiness: Secondary | ICD-10-CM | POA: Diagnosis not present

## 2021-05-24 ENCOUNTER — Encounter: Payer: Self-pay | Admitting: Internal Medicine

## 2021-06-09 ENCOUNTER — Telehealth: Payer: Self-pay | Admitting: Internal Medicine

## 2021-06-09 NOTE — Chronic Care Management (AMB) (Signed)
°  Chronic Care Management   Outreach Note  06/09/2021 Name: NETTA FODGE MRN: [id number] DOB: 09-19-1932  Referred by: Venia Carbon, MD Reason for referral : No chief complaint on file.   An unsuccessful telephone outreach was attempted today. The patient was referred to the pharmacist for assistance with care management and care coordination.   Follow Up Plan:   Tatjana Dellinger Upstream Scheduler

## 2021-06-15 ENCOUNTER — Telehealth: Payer: Self-pay | Admitting: Internal Medicine

## 2021-06-15 NOTE — Chronic Care Management (AMB) (Signed)
°  Chronic Care Management   Note  06/15/2021 Name: AAVYA SHAFER MRN: [id number] DOB: [birth year]  ZELIA YZAGUIRRE is a 88 y.o. year old female who is a primary care patient of Venia Carbon, MD. I reached out to [name] by phone today in response to a referral sent by Ms. Ardis Rowan Lemen's PCP, Venia Carbon, MD.   Ms. Preece was given information about Chronic Care Management services today including:  CCM service includes personalized support from designated clinical staff supervised by her physician, including individualized plan of care and coordination with other care providers 24/7 contact phone numbers for assistance for urgent and routine care needs. Service will only be billed when office clinical staff spend 20 minutes or more in a month to coordinate care. Only one practitioner may furnish and bill the service in a calendar month. The patient may stop CCM services at any time (effective at the end of the month) by phone call to the office staff.   Patient agreed to services and verbal consent obtained.   Follow up plan:   Tatjana Secretary/administrator

## 2021-07-08 ENCOUNTER — Telehealth: Payer: Self-pay

## 2021-07-08 DIAGNOSIS — G250 Essential tremor: Secondary | ICD-10-CM | POA: Diagnosis not present

## 2021-07-08 DIAGNOSIS — Z8781 Personal history of (healed) traumatic fracture: Secondary | ICD-10-CM | POA: Diagnosis not present

## 2021-07-08 DIAGNOSIS — G3184 Mild cognitive impairment, so stated: Secondary | ICD-10-CM | POA: Diagnosis not present

## 2021-07-08 NOTE — Progress Notes (Signed)
? ? ?  Chronic Care Management ?Pharmacy Assistant  ? ?Name: Ariel Phillips  MRN: [id number] DOB: January 16, 1933 ? ?Reason for Encounter: CCM (Initial Questions) ?  ?Recent office visits:  ?05/10/2021 - Ariel Simpler, MD - Patient presented for memory loss. Abnormal labs: "The high B12 can be normal if you take supplements". Referral to Neurologist. Ordered: MRI Brain ?02/05/2021 - Ariel Simpler, MD - Patient presented for Medicare Wellness. No medication changes.  ? ?Recent consult visits:  ?05/22/2021 - Procedure: MRI Brain ?02/03/2021 - Alonza Bogus, PA - Gastroenterology - Patient presented for irritable bowel syndrome. Start: rifaximin (XIFAXAN) 550 MG TABS tablet.  ? ?Hospital visits:  ?None in previous 6 months ? ?Medications: ?Outpatient Encounter Medications as of 07/08/2021  ?Medication Sig  ? alum & mag hydroxide-simeth (MAALOX PLUS) 400-400-40 MG/5ML suspension Take 10 mLs by mouth as needed for indigestion.  ? aspirin EC 81 MG tablet Take 1 tablet (81 mg total) by mouth daily.  ? carboxymethylcellulose (REFRESH PLUS) 0.5 % SOLN Place 2 drops into both eyes daily as needed (dry eyes).   ? diclofenac Sodium (VOLTAREN) 1 % GEL Apply 2 g topically 4 (four) times daily as needed.  ? ibuprofen (ADVIL) 200 MG tablet Take 200 mg by mouth every 6 (six) hours as needed.  ? levothyroxine (SYNTHROID) 75 MCG tablet TAKE 1 TABLET EVERY DAY ON EMPTY STOMACHWITH A GLASS OF WATER AT LEAST 30-60 MINBEFORE BREAKFAST  ? loratadine (CLARITIN) 10 MG tablet Take 10 mg by mouth at bedtime.   ? omeprazole (PRILOSEC) 20 MG capsule TAKE 1 CAPSULE BY MOUTH AT BEDTIME  ? rifaximin (XIFAXAN) 550 MG TABS tablet Take 1 tablet (550 mg total) by mouth 3 (three) times daily. (Patient taking differently: Take 550 mg by mouth 3 (three) times daily. Pt taking as needed.)  ? vitamin B-12 (CYANOCOBALAMIN) 100 MCG tablet Take 100 mcg by mouth daily.  ? ?No facility-administered encounter medications on file as of 07/08/2021.  ? ?No results found for:  HGBA1C, MICROALBUR  ? ?BP Readings from Last 3 Encounters:  ?05/10/21 114/78  ?02/05/21 104/70  ?02/03/21 112/77  ? ?Patient contacted to confirm telephone appointment with Charlene Brooke, Pharm D, on 07/14/2021 at 1:30. ? ?Do you have any problems getting your medications? No ? ?What is your top health concern you would like to discuss at your upcoming visit? Patient states her Cogitative impairment and her IBS is her biggest concerns. ? ?Have you seen any other providers since your last visit with PCP? No - MRI of brain on 05/22/2021 ? ?Star Rating Drugs:  ?Medication:  Last Fill: Day Supply ?No star rating drugs notes ? ?Care Gaps: ?Annual wellness visit in last year? Yes 02/05/2021 ?Most Recent BP reading:  114/78 on 05/20/2021 ? ?Charlene Brooke, CPP notified ? ?Marijean Niemann, RMA ?Clinical Pharmacy Assistant ?[phone] ? ? ? ? ?

## 2021-07-14 ENCOUNTER — Ambulatory Visit (INDEPENDENT_AMBULATORY_CARE_PROVIDER_SITE_OTHER): Payer: Medicare Other | Admitting: Pharmacist

## 2021-07-14 ENCOUNTER — Other Ambulatory Visit: Payer: Self-pay

## 2021-07-14 DIAGNOSIS — G3184 Mild cognitive impairment, so stated: Secondary | ICD-10-CM

## 2021-07-14 DIAGNOSIS — K219 Gastro-esophageal reflux disease without esophagitis: Secondary | ICD-10-CM

## 2021-07-14 DIAGNOSIS — K580 Irritable bowel syndrome with diarrhea: Secondary | ICD-10-CM

## 2021-07-14 DIAGNOSIS — E039 Hypothyroidism, unspecified: Secondary | ICD-10-CM

## 2021-07-14 DIAGNOSIS — I2510 Atherosclerotic heart disease of native coronary artery without angina pectoris: Secondary | ICD-10-CM

## 2021-07-14 NOTE — Progress Notes (Signed)
? ?Chronic Care Management ?Pharmacy Note ? ?07/14/2021 ?Name:  Ariel Phillips MRN:  [id number] DOB:  [birth year] ? ?Summary: CCM Initial visit ?-Pt started donepezil yesterday and had diarrhea, but today no issues. Discussed side effects of donepezil often improve with time ?-Pt reports ocular migraine, 1 episode over a year ago where she lost vision for several minutes, no repeat episode since. She wants to know if is safe to drive long distances, she has already contacted Neurology with this question ? ?Recommendations/Changes made from today's visit: ?-Advised pt to contact Neurology for alternative if donepezil side effect (diarrhea) does not improve ?-Advised to await Dr Trena Platt recommendations regarding driving long distances ? ?Plan: ?-The patient has been provided with contact information for the care management team and has been advised to call with any health related questions or concerns.  ?-PCP AWV 02/08/22 ? ? ?Subjective: ?Ariel Phillips is an 88 y.o. year old female who is a primary patient of Letvak, Theophilus Kinds, MD.  The CCM team was consulted for assistance with disease management and care coordination needs.   ? ?Engaged with patient by telephone for initial visit in response to provider referral for pharmacy case management and/or care coordination services.  ? ?Consent to Services:  ?The patient was given the following information about Chronic Care Management services today, agreed to services, and gave verbal consent: 1. CCM service includes personalized support from designated clinical staff supervised by the primary care provider, including individualized plan of care and coordination with other care providers 2. 24/7 contact phone numbers for assistance for urgent and routine care needs. 3. Service will only be billed when office clinical staff spend 20 minutes or more in a month to coordinate care. 4. Only one practitioner may furnish and bill the service in a calendar month. 5.The patient  may stop CCM services at any time (effective at the end of the month) by phone call to the office staff. 6. The patient will be responsible for cost sharing (co-pay) of up to 20% of the service fee (after annual deductible is met). Patient agreed to services and consent obtained. ? ?Patient Care Team: ?Venia Carbon, MD as PCP - General (Pediatrics) ?Kate Sable, MD as PCP - Cardiology (Cardiology) ?, Cleaster Corin, [facility] as Pharmacist (Pharmacist) ? ?Recent office visits: ?05/10/2021 - [name], MD - Patient presented for memory loss. Abnormal labs: "The high B12 can be normal if you take supplements". Referral to Neurologist. Ordered: MRI Brain ? ?02/05/2021 - [name], MD - Patient presented for [facility] Wellness. No medication changes.  ? ?Recent consult visits: ?07/08/21 Dr Manuella Ghazi (Neurology): eval memory loss. Start donepezil 5 mg AM. ?05/22/2021 - Procedure: MRI Brain ?02/03/2021 - Alonza Bogus, PA - Gastroenterology - Patient presented for irritable bowel syndrome. Start: rifaximin (XIFAXAN) 550 MG TABS tablet.  ? ?Hospital visits: ?None in previous 6 months ? ? ?Objective: ? ?Lab Results  ?Component Value Date  ? CREATININE 0.71 05/14/2021  ? BUN 17 05/14/2021  ? GFR 77.11 05/14/2021  ? GFRNONAA >60 06/28/2020  ? GFRAA >60 07/09/2019  ? NA 140 05/14/2021  ? K 4.5 05/14/2021  ? CALCIUM 9.1 05/14/2021  ? CO2 26 05/14/2021  ? GLUCOSE 83 05/14/2021  ? ? ?Lab Results  ?Component Value Date/Time  ? GFR 77.11 05/14/2021 08:03 AM  ? GFR 79.89 10/09/2020 10:04 AM  ?  ?Last diabetic Eye exam: No results found for: HMDIABEYEEXA  ?Last diabetic Foot exam: No results found for: HMDIABFOOTEX  ? ?Lab  Results  ?Component Value Date  ? CHOL 172 02/04/2020  ? HDL 59.60 02/04/2020  ? [place] 96 02/04/2020  ? TRIG 83.0 02/04/2020  ? CHOLHDL 3 02/04/2020  ? ? ?Hepatic Function Latest Ref Rng & Units 05/14/2021 06/26/2020 02/04/2020  ?Total Protein 6.0 - 8.3 g/dL 7.2 7.3 7.0  ?Albumin 3.5 - 5.2 g/dL 4.2 3.9  4.3  ?AST 0 - 37 U/L _0 ?ALT 0 - 35 U/L _1 ?Alk Phosphatase 39 - 117 U/L 75 81 66  ?Total Bilirubin 0.2 - 1.2 mg/dL 0.4 0.7 0.4  ?Bilirubin, Direct 0.0 - 0.3 mg/dL - - -  ? ? ?Lab Results  ?Component Value Date/Time  ? TSH 4.39 10/09/2020 10:04 AM  ? TSH 3.62 02/04/2020 12:14 PM  ? FREET4 1.00 05/14/2021 08:03 AM  ? FREET4 0.93 02/04/2020 12:14 PM  ? ? ?CBC Latest Ref Rng & Units 05/14/2021 10/09/2020 06/28/2020  ?WBC 4.0 - 10.5 K/uL 4.4 4.9 -  ?Hemoglobin 12.0 - 15.0 g/dL 14.4 15.1(H) 12.6  ?Hematocrit 36.0 - 46.0 % 44.2 45.6 38.3  ?Platelets 150.0 - 400.0 K/uL 232.0 212.0 -  ? ? ?No results found for: VD25OH ? ?Clinical ASCVD: Yes  ?The ASCVD Risk score (Arnett DK, et al., 2019) failed to calculate for the following reasons: ?  The 2019 ASCVD risk score is only valid for ages 44 to 27   ? ?Depression screen [facility] 2/9 02/05/2021 02/04/2020 02/04/2020  ?Decreased Interest 0 0 0  ?Down, Depressed, Hopeless _2 ?PHQ - 2 Score _3 ?  ? ?Social History  ? ?Tobacco Use  ?Smoking Status Never  ?Smokeless Tobacco Never  ? ?BP Readings from Last 3 Encounters:  ?05/10/21 114/78  ?02/05/21 104/70  ?02/03/21 112/77  ? ?Pulse Readings from Last 3 Encounters:  ?05/10/21 88  ?02/05/21 89  ?02/03/21 87  ? ?Wt Readings from Last 3 Encounters:  ?05/10/21 144 lb (65.3 kg)  ?02/05/21 141 lb (64 kg)  ?02/03/21 144 lb 3.2 oz (65.4 kg)  ? ?BMI Readings from Last 3 Encounters:  ?05/10/21 25.51 kg/m?  ?02/05/21 25.38 kg/m?  ?02/03/21 24.75 kg/m?  ? ? ?Assessment/Interventions: Review of patient past medical history, allergies, medications, health status, including review of consultants reports, laboratory and other test data, was performed as part of comprehensive evaluation and provision of chronic care management services.  ? ?SDOH:  (Social Determinants of Health) assessments and interventions performed: Yes ?SDOH Interventions   ? ?Flowsheet Row Most Recent Value  ?SDOH Interventions   ?Food Insecurity Interventions  Intervention Not Indicated  ?Financial Strain Interventions Intervention Not Indicated  ? ?  ? ?SDOH Screenings  ? ?Alcohol Screen: Not on file  ?Depression (PHQ2-9): Low Risk   ? PHQ-2 Score: 1  ?Financial Resource Strain: Low Risk   ? Difficulty of Paying Living Expenses: Not hard at all  ?Food Insecurity: No Food Insecurity  ? Worried About Charity fundraiser in the Last Year: Never true  ? Ran Out of Food in the Last Year: Never true  ?Housing: Not on file  ?Physical Activity: Not on file  ?Social Connections: Not on file  ?Stress: Not on file  ?Tobacco Use: Low Risk   ? Smoking Tobacco Use: Never  ? Smokeless Tobacco Use: Never  ? Passive Exposure: Not on file  ?Transportation Needs: Not on file  ? ? ?[place] ? ?Allergies  ?Allergen Reactions  ? Penicillins Other (See Comments)  ?  Did  it involve swelling of the face/tongue/throat, SOB, or low BP? No ?Did it involve sudden or severe rash/hives, skin peeling, or any reaction on the inside of your mouth or nose? No ?Did you need to seek medical attention at a hospital or doctor's office? Yes ?When did it last happen?      20+ years ?If all above answers are "NO", may proceed with cephalosporin use. ? ?Pt does not remember the reaction  ? Latex Rash  ? ? ?Medications Reviewed Today   ? ? Reviewed by Charlton Haws, [facility] (Pharmacist) on 07/14/21 at 1405  Med List Status: <None>  ? ?Medication Order Taking? Sig Documenting Provider Last Dose Status Informant  ?alum & mag hydroxide-simeth (MAALOX PLUS) 400-400-40 MG/5ML suspension [id number] Yes Take 10 mLs by mouth as needed for indigestion. [provider] Taking Active   ?         ?Med Note [facility], BRANDY L   Mon Jan 20, 2020 11:07 AM)    ?aspirin EC 81 MG tablet [id number] Yes Take 1 tablet (81 mg total) by mouth daily. Kate Sable, MD Taking Active Self  ?carboxymethylcellulose (REFRESH PLUS) 0.5 % SOLN [id number] Yes Place 2 drops into both eyes daily as needed (dry eyes).   [provider] Taking Active Self  ?diclofenac Sodium (VOLTAREN) 1 % GEL [id number] Yes Apply 2 g topically 4 (four) times daily as needed. Venia Carbon, MD Taking Active Self  ?donepezil (ARICEPT)

## 2021-07-14 NOTE — Patient Instructions (Addendum)
Visit Information ? ?Phone number for Pharmacist: [phone] ? ?Thank you for meeting with me to discuss your medications! I look forward to working with you to achieve your health care goals. Below is a summary of what we talked about during the visit: ? ? Goals Addressed   ?None ?  ? ? ?Care Plan : Ariel Phillips  ?Updates made by Ariel Phillips, RPH since 07/14/2021 12:00 AM  ?  ? ?Problem: Coronary Artery Disease, GERD, and Hypothyroidism, Mild cognitive impairment   ?Priority: High  ?  ? ?Long-Range Goal: Disease mgmt   ?Start Date: 07/14/2021  ?Expected End Date: 07/15/2022  ?This Visit's Progress: On track  ?Priority: High  ?Note:   ?Current Barriers:  ?Unable to independently monitor therapeutic efficacy ? ?Pharmacist Clinical Goal(s):  ?Patient will achieve adherence to monitoring guidelines and medication adherence to achieve therapeutic efficacy through collaboration with PharmD and provider.  ? ?Interventions: ?1:1 collaboration with Venia Carbon, MD regarding development and update of comprehensive plan of care as evidenced by provider attestation and co-signature ?Inter-disciplinary care team collaboration (see longitudinal plan of care) ?Comprehensive medication review performed; medication list updated in electronic medical record ? ?CAD / Hyperlipidemia: (LDL goal < 70) ?-Controlled - LDL 96 (01/2020); per cardiology statin benefit is limited due to age ?-Hx aortic atherosclerosis ?-Current treatment: ?Aspirin 81 mg daily - Appropriate, Effective, Safe, Accessible ?-Medications previously tried: n/a  ?-Educated on aspirin benefits/risks ?-Recommended to continue current medication ? ?Hypothyroidism (Goal: maintain TSH in goal range) ?-Controlled - TSH I sat goal ?-Current treatment  ?Levothyroxine 75 mcg daily - Appropriate, Effective, Safe, Accessible ?-Medications previously tried: n/a  ?-Recommended to continue current medication ? ?GERD (Goal: manage symptoms) ?-Controlled - pt  reports no issues whatsoever with reflux/heartburn ?-Current treatment  ?Omeprazole 20 mg daily HS - Appropriate, Effective, Safe, Accessible ?-Medications previously tried: n/a ?-Discussed she may not need PPI anymore, can try time off medication or just use PRN  ? ?IBS - D (Goal: manage symptoms) ?-Controlled - per pt she has not needed Xifaxan in several months which is good control for her;  ?-Current treatment  ?Maalox Plus PRN- Appropriate, Effective, Safe, Accessible ?Xifaxan 550 mg TID prn - Appropriate, Effective, Safe, Accessible ?-Medications previously tried: n/a  ?-Recommended to continue current medication - pt to contact GI if she needs Xifaxan again ? ?Mild cognitive impairment (Goal: slow progression) ?-Query controlled - pt just started donepezil yesterday, she reports bad diarrhea yesterday but no issues today ?-Follows with Neurology (Dr Manuella Ghazi) ?-Current treatment  ?Donepezil 5 mg daily - Appropriate, Effective, Safe, Accessible ?-Medications previously tried: n/a  ?-Discussed benefits of donepezil for slowing progression of memory loss, not improving memory from baseline; discussed diarrhea as side effect of donepezil, if this does not got better she can contact neurology ?-Recommend to continue current medication ? ?Osteoarthritis (Goal: manage pain) ?-Controlled - pt does not use OTC often for pain relief ?-Current treatment  ?Ibuprofen 200 mg PRN ?Voltaren gel PRN ?-Medications previously tried: n/a  ?-Recommended to continue current medication ? ?Osteopenia (Goal prevent fracture) ?-Controlled ?-Last DEXA Scan: 12/2010 - femoral neck (T-score: -1.7) and total hip (T-score: -1.1); hx ankle fracture 2019 ?-Patient is not a candidate for pharmacologic treatment ?-Current treatment  ?None ?-Medications previously tried: n/a  ?-Recommend [phone] units of vitamin D daily. Recommend 1200 mg of calcium daily from dietary and supplemental sources. ? ?Health Maintenance ?-Vaccine gaps: None ?-Pt  reports history of ocular migraine (1 episode > 1 year  ago, she completely lost vision in her eye for several minutes, this has not happened again since but she wants to know if it is safe for her to drive long distances); pt has already called Dr Manuella Ghazi about this, advised pt to await his response ?-Current OTC: ?Vitamin B12 1000 mcg daily ?Loratadine 10 mg daily HS ? ?Patient Goals/Self-Care Activities ?Patient will:  ?- take medications as prescribed as evidenced by patient report and record review ?focus on medication adherence by routine ?-Contact Neurology if donepezil side effects do not improve with time ?  ?  ? ?Ariel Phillips was given information about Chronic Care Management services today including:  ?CCM service includes personalized support from designated clinical staff supervised by her physician, including individualized plan of care and coordination with other care providers ?24/7 contact phone numbers for assistance for urgent and routine care needs. ?Standard insurance, coinsurance, copays and deductibles apply for chronic care management only during months in which we provide at least 20 minutes of these services. Most insurances cover these services at 100%, however patients may be responsible for any copay, coinsurance and/or deductible if applicable. This service may help you avoid the need for more expensive face-to-face services. ?Only one practitioner may furnish and bill the service in a calendar month. ?The patient may stop CCM services at any time (effective at the end of the month) by phone call to the office staff. ? ?Patient agreed to services and verbal consent obtained.  ? ?Patient verbalizes understanding of instructions and care plan provided today and agrees to view in [place]. Active MyChart status confirmed with patient.   ?The patient has been provided with contact information for the care management team and has been advised to call with any health related questions or concerns.   ? ?Charlene Brooke, PharmD, BCACP ?Clinical Pharmacist ?[facility] ?[phone]  ?

## 2021-07-16 DIAGNOSIS — U071 COVID-19: Secondary | ICD-10-CM | POA: Diagnosis not present

## 2021-07-28 ENCOUNTER — Encounter: Payer: Self-pay | Admitting: Internal Medicine

## 2021-07-28 NOTE — Telephone Encounter (Signed)
I spoke with pt; pt said the larger red spot on hand has been there for awhile but the crack came on lt hand on 07/26/21 spot is not actively bleeding now. Offered pt appt with a different provider today but pt prefers to see Dr Silvio Pate. Offered appt with Dr Silvio Pate on 07/22/21 at 3:30 and pt has appt at [facility] at 3 PM. Is it possible to change the 2 PM same day and schedule pt with that appt. Sending note to Dr Silvio Pate and Larene Beach CMA. Will teams Tarsha CMA. After Dr Silvio Pate responds please let pt know and schedule appt if appropriate. ?

## 2021-07-29 ENCOUNTER — Encounter: Payer: Self-pay | Admitting: Internal Medicine

## 2021-07-29 ENCOUNTER — Ambulatory Visit (INDEPENDENT_AMBULATORY_CARE_PROVIDER_SITE_OTHER): Payer: Medicare Other | Admitting: Internal Medicine

## 2021-07-29 DIAGNOSIS — R2681 Unsteadiness on feet: Secondary | ICD-10-CM | POA: Diagnosis not present

## 2021-07-29 DIAGNOSIS — T148XXA Other injury of unspecified body region, initial encounter: Secondary | ICD-10-CM | POA: Diagnosis not present

## 2021-07-29 DIAGNOSIS — I2510 Atherosclerotic heart disease of native coronary artery without angina pectoris: Secondary | ICD-10-CM

## 2021-07-29 DIAGNOSIS — G3184 Mild cognitive impairment, so stated: Secondary | ICD-10-CM

## 2021-07-29 NOTE — Progress Notes (Signed)
? ?Subjective:  ? ? Patient ID: Ariel Phillips, female    DOB: 23-Aug-1932, 88 y.o.   MRN: [id number] ? ?HPI ?Here to check bleeding spot on left hand ? ?Had spot on dorsum of left hand---just appeared (large dark spot) ?Then it opened and oozed blood 3 days ago ?Then formed scab--but inside ? ?No trauma that she knows of ? ?Current Outpatient Medications on File Prior to Visit  ?Medication Sig Dispense Refill  ? alum & mag hydroxide-simeth (MAALOX PLUS) 400-400-40 MG/5ML suspension Take 10 mLs by mouth as needed for indigestion.    ? aspirin EC 81 MG tablet Take 1 tablet (81 mg total) by mouth daily.    ? carboxymethylcellulose (REFRESH PLUS) 0.5 % SOLN Place 2 drops into both eyes daily as needed (dry eyes).     ? diclofenac Sodium (VOLTAREN) 1 % GEL Apply 2 g topically 4 (four) times daily as needed. 150 g 5  ? ibuprofen (ADVIL) 200 MG tablet Take 200 mg by mouth every 6 (six) hours as needed.    ? levothyroxine (SYNTHROID) 75 MCG tablet TAKE 1 TABLET EVERY DAY ON EMPTY STOMACHWITH A GLASS OF WATER AT LEAST 30-60 MINBEFORE BREAKFAST 90 tablet 3  ? loratadine (CLARITIN) 10 MG tablet Take 10 mg by mouth at bedtime.     ? omeprazole (PRILOSEC) 20 MG capsule TAKE 1 CAPSULE BY MOUTH AT BEDTIME 90 capsule 3  ? vitamin B-12 (CYANOCOBALAMIN) 100 MCG tablet Take 100 mcg by mouth daily.    ? ?No current facility-administered medications on file prior to visit.  ? ? ?Allergies  ?Allergen Reactions  ? Penicillins Other (See Comments)  ?  Did it involve swelling of the face/tongue/throat, SOB, or low BP? No ?Did it involve sudden or severe rash/hives, skin peeling, or any reaction on the inside of your mouth or nose? No ?Did you need to seek medical attention at a hospital or doctor's office? Yes ?When did it last happen?      20+ years ?If all above answers are "NO", may proceed with cephalosporin use. ? ?Pt does not remember the reaction  ? Latex Rash  ? ? ?Past Medical History:  ?Diagnosis Date  ? Allergic asthma   ?  Allergic rhinitis due to pollen   ? Allergy   ? GERD (gastroesophageal reflux disease)   ? IBS (irritable bowel syndrome)   ? Osteopenia   ? Unspecified hypothyroidism   ? ? ?Past Surgical History:  ?Procedure Laterality Date  ? ANAL FISSURE REPAIR  1950's  ? BASAL CELL CARCINOMA EXCISION  2000's  ? nose  ? CHOLECYSTECTOMY  2008  ? COLONOSCOPY  Multiple  ? Negative screening exams  ? ENDOSCOPIC RETROGRADE CHOLANGIOPANCREATOGRAPHY (ERCP) WITH PROPOFOL N/A 02/13/2019  ? Procedure: ENDOSCOPIC RETROGRADE CHOLANGIOPANCREATOGRAPHY (ERCP) WITH PROPOFOL;  Surgeon: Gatha Mayer, MD;  Location: [facility] ENDOSCOPY;  Service: Endoscopy;  Laterality: N/A;  ? [place] or so  ? with fissure repair  ? ORIF ANKLE FRACTURE Right 06/27/2020  ? Procedure: OPEN REDUCTION INTERNAL FIXATION (ORIF) ANKLE FRACTURE;  Surgeon: Caroline More, DPM;  Location: [facility] ORS;  Service: Podiatry;  Laterality: Right;  ? REMOVAL OF STONES  02/13/2019  ? Procedure: REMOVAL OF STONES;  Surgeon: Gatha Mayer, MD;  Location: [facility] ENDOS[facility];  Service: Endoscopy;;  ? SPHINCTEROTOMY  02/13/2019  ? Procedure: SPHINCTEROTOMY;  Surgeon: Gatha Mayer, MD;  Location: [facility];  Service: Endoscopy;;  ? TUBAL LIGATION    ? ? ?Family History  ?Problem  Relation Age of Onset  ? COPD Mother   ? Cancer Mother   ?     unclear diagnosis  ? Transient ischemic attack Mother   ? Diabetes Sister   ? Hypertension Sister   ? Stroke Other   ? Cancer Paternal Grandmother   ?     ?breast  ? Heart disease Neg Hx   ? ? ?Social History  ? ?Socioeconomic History  ? Marital status: Divorced  ?  Spouse name: Not on file  ? Number of children: 1  ? Years of education: Not on file  ? Highest education level: Not on file  ?Occupational History  ? Occupation: Retired historian--consultant  ?Tobacco Use  ? Smoking status: Never  ? Smokeless tobacco: Never  ?Vaping Use  ? Vaping Use: Never used  ?Substance and Sexual Activity  ? Alcohol use: Yes  ?  Comment: rare wine  ? Drug  use: No  ? Sexual activity: Not Currently  ?Other Topics Concern  ? Not on file  ?Social History Narrative  ? Divorced and retired , moved from [place], [place]  ? 1 Daughter in [place]  ? Has living will  ? Daughter has health care POA.---[name]  ? Requests DNR--order done 06/19/12  ? Would not want prolonged mechanical ventilation  ? May accept tube feeds temporarily but not if cognitively unaware  ? ?Social Determinants of Health  ? ?Financial Resource Strain: Low Risk   ? Difficulty of Paying Living Expenses: Not hard at all  ?Food Insecurity: No Food Insecurity  ? Worried About Charity fundraiser in the Last Year: Never true  ? Ran Out of Food in the Last Year: Never true  ?Transportation Needs: Not on file  ?Physical Activity: Not on file  ?Stress: Not on file  ?Social Connections: Not on file  ?Intimate Partner Violence: Not on file  ? ?Review of Systems ?No abnormal bleeding ?No bruising or other skin lesions (no apparent petechiae) ?Formal diagnosis of MCI ?   ?Objective:  ? Physical Exam ?Constitutional:   ?   Appearance: Normal appearance.  ?Skin: ?   Comments: Small skin tear with 1m diastasis ----half moon about 3cm ---on the dorsum of left wrist ?Several benign pigmented lesions also ?Small area of subcutaneous blood around the skin tear  ?Neurological:  ?   Mental Status: She is alert.  ?  ? ? ? ? ?   ?Assessment & Plan:  ? ?

## 2021-07-29 NOTE — Assessment & Plan Note (Signed)
Saw Dr Manuella Ghazi ?He prescribed donepezil but she decided not to take it----concerned that it might make her IBS worse ?Recommended against memantine also ?

## 2021-07-29 NOTE — Assessment & Plan Note (Signed)
Small skin tear---reassured her that this is nothing to worry about ?No infection ?

## 2021-07-30 DIAGNOSIS — M1990 Unspecified osteoarthritis, unspecified site: Secondary | ICD-10-CM | POA: Diagnosis not present

## 2021-07-30 DIAGNOSIS — E785 Hyperlipidemia, unspecified: Secondary | ICD-10-CM

## 2021-07-30 DIAGNOSIS — M8580 Other specified disorders of bone density and structure, unspecified site: Secondary | ICD-10-CM

## 2021-07-30 DIAGNOSIS — I2510 Atherosclerotic heart disease of native coronary artery without angina pectoris: Secondary | ICD-10-CM

## 2021-07-30 DIAGNOSIS — E039 Hypothyroidism, unspecified: Secondary | ICD-10-CM

## 2021-09-13 ENCOUNTER — Encounter: Payer: Self-pay | Admitting: Internal Medicine

## 2021-09-28 DIAGNOSIS — Z23 Encounter for immunization: Secondary | ICD-10-CM | POA: Diagnosis not present

## 2021-10-18 DIAGNOSIS — R2681 Unsteadiness on feet: Secondary | ICD-10-CM | POA: Diagnosis not present

## 2021-10-18 DIAGNOSIS — K580 Irritable bowel syndrome with diarrhea: Secondary | ICD-10-CM | POA: Diagnosis not present

## 2021-10-18 DIAGNOSIS — R001 Bradycardia, unspecified: Secondary | ICD-10-CM | POA: Diagnosis not present

## 2021-10-18 DIAGNOSIS — G3184 Mild cognitive impairment, so stated: Secondary | ICD-10-CM | POA: Diagnosis not present

## 2021-10-18 DIAGNOSIS — Z8781 Personal history of (healed) traumatic fracture: Secondary | ICD-10-CM | POA: Diagnosis not present

## 2021-10-26 DIAGNOSIS — R001 Bradycardia, unspecified: Secondary | ICD-10-CM | POA: Diagnosis not present

## 2021-10-26 DIAGNOSIS — I2510 Atherosclerotic heart disease of native coronary artery without angina pectoris: Secondary | ICD-10-CM | POA: Diagnosis not present

## 2021-10-26 DIAGNOSIS — I700 Atherosclerosis of aorta: Secondary | ICD-10-CM | POA: Diagnosis not present

## 2021-10-26 DIAGNOSIS — R0602 Shortness of breath: Secondary | ICD-10-CM | POA: Diagnosis not present

## 2021-11-11 DIAGNOSIS — R0602 Shortness of breath: Secondary | ICD-10-CM | POA: Diagnosis not present

## 2021-11-11 DIAGNOSIS — R001 Bradycardia, unspecified: Secondary | ICD-10-CM | POA: Diagnosis not present

## 2021-11-15 DIAGNOSIS — R001 Bradycardia, unspecified: Secondary | ICD-10-CM | POA: Diagnosis not present

## 2021-11-16 ENCOUNTER — Other Ambulatory Visit: Payer: Self-pay | Admitting: Internal Medicine

## 2021-11-16 ENCOUNTER — Encounter: Payer: Self-pay | Admitting: Internal Medicine

## 2021-11-18 DIAGNOSIS — I700 Atherosclerosis of aorta: Secondary | ICD-10-CM | POA: Diagnosis not present

## 2021-11-18 DIAGNOSIS — I2510 Atherosclerotic heart disease of native coronary artery without angina pectoris: Secondary | ICD-10-CM | POA: Diagnosis not present

## 2021-11-22 NOTE — Telephone Encounter (Signed)
FYI, I let patient know that we are able to see the results and notes from [facility]

## 2021-11-29 DIAGNOSIS — I2510 Atherosclerotic heart disease of native coronary artery without angina pectoris: Secondary | ICD-10-CM | POA: Diagnosis not present

## 2021-12-07 DIAGNOSIS — H2513 Age-related nuclear cataract, bilateral: Secondary | ICD-10-CM | POA: Diagnosis not present

## 2021-12-25 ENCOUNTER — Encounter: Payer: Self-pay | Admitting: Internal Medicine

## 2022-01-18 ENCOUNTER — Encounter: Payer: Self-pay | Admitting: Internal Medicine

## 2022-01-21 DIAGNOSIS — G250 Essential tremor: Secondary | ICD-10-CM | POA: Diagnosis not present

## 2022-01-21 DIAGNOSIS — R001 Bradycardia, unspecified: Secondary | ICD-10-CM | POA: Diagnosis not present

## 2022-01-21 DIAGNOSIS — G3184 Mild cognitive impairment, so stated: Secondary | ICD-10-CM | POA: Diagnosis not present

## 2022-02-04 ENCOUNTER — Ambulatory Visit: Payer: Medicare Other | Attending: Cardiology | Admitting: Cardiology

## 2022-02-04 ENCOUNTER — Encounter: Payer: Self-pay | Admitting: Cardiology

## 2022-02-04 VITALS — BP 122/68 | HR 98 | Ht 65.0 in | Wt 149.2 lb

## 2022-02-04 DIAGNOSIS — K2100 Gastro-esophageal reflux disease with esophagitis, without bleeding: Secondary | ICD-10-CM | POA: Insufficient documentation

## 2022-02-04 DIAGNOSIS — I2510 Atherosclerotic heart disease of native coronary artery without angina pectoris: Secondary | ICD-10-CM | POA: Insufficient documentation

## 2022-02-04 NOTE — Patient Instructions (Signed)
Medication Instructions:   Your physician recommends that you continue on your current medications as directed. Please refer to the Current Medication list given to you today.  *If you need a refill on your cardiac medications before your next appointment, please call your pharmacy*   Follow-Up: At [facility], you and your health needs are our priority.  As part of our continuing mission to provide you with exceptional heart care, we have created designated Provider Care Teams.  These Care Teams include your primary Cardiologist (physician) and Advanced Practice Providers (APPs -  Physician Assistants and Nurse Practitioners) who all work together to provide you with the care you need, when you need it.  We recommend signing up for the patient portal called "MyChart".  Sign up information is provided on this After Visit Summary.  MyChart is used to connect with patients for Virtual Visits (Telemedicine).  Patients are able to view lab/test results, encounter notes, upcoming appointments, etc.  Non-urgent messages can be sent to your provider as well.   To learn more about what you can do with MyChart, go to [email or web address].    Your next appointment:   1 year(s)  The format for your next appointment:   In Person  Provider:   Brian Agbor-Etang, MD    Other Instructions   Important Information About Sugar       

## 2022-02-04 NOTE — Progress Notes (Signed)
Cardiology Office Note:    Date:  02/04/2022   ID:  Ariel Phillips, DOB Dec 10, 1932, MRN [id number]  PCP:  Venia Carbon, MD  Cardiologist:  Kate Sable, MD  Electrophysiologist:  None   Referring MD: Venia Carbon, MD   Chief Complaint  Patient presents with   Follow-up    12 month f/u (overdue), have heart beat in mornings    History of Present Illness:    Ariel Phillips is a 88 y.o. female with a hx of GERD, non obstructive CAD, who presents for follow-up.    States doing okay since last office visit.  Denies chest pain or shortness of breath.  Takes daily baby aspirin, reflux symptoms adequately controlled with Prilosec.  Feels well, has no concerns at this time.  Prior notes Echo 07/2019 showed normal systolic function, impaired relaxation EF 60 to 65%. Coronary CTA showed a calcium score of 200, moderate LAD disease, FFR CT did not show any significant stenosis in the LAD.  FFR CT showed stenosis in diagonal 1, small vessel.   Past Medical History:  Diagnosis Date   Allergic asthma    Allergic rhinitis due to pollen    Allergy    GERD (gastroesophageal reflux disease)    IBS (irritable bowel syndrome)    Osteopenia    Unspecified hypothyroidism     Past Surgical History:  Procedure Laterality Date   ANAL FISSURE REPAIR  1950's   BASAL CELL CARCINOMA EXCISION  2000's   nose   CHOLECYSTECTOMY  2008   COLONOSCOPY  Multiple   Negative screening exams   ENDOSCOPIC RETROGRADE CHOLANGIOPANCREATOGRAPHY (ERCP) WITH PROPOFOL N/A 02/13/2019   Procedure: ENDOSCOPIC RETROGRADE CHOLANGIOPANCREATOGRAPHY (ERCP) WITH PROPOFOL;  Surgeon: Gatha Mayer, MD;  Location: [facility];  Service: Endoscopy;  Laterality: N/A;   [place] or so   with fissure repair   ORIF ANKLE FRACTURE Right 06/27/2020   Procedure: OPEN REDUCTION INTERNAL FIXATION (ORIF) ANKLE FRACTURE;  Surgeon: Caroline More, DPM;  Location: [facility];  Service: Podiatry;   Laterality: Right;   REMOVAL OF STONES  02/13/2019   Procedure: REMOVAL OF STONES;  Surgeon: Gatha Mayer, MD;  Location: [facility];  Service: Endoscopy;;   SPHINCTEROTOMY  02/13/2019   Procedure: [name];  Surgeon: Gatha Mayer, MD;  Location: [facility];  Service: Endoscopy;;   TUBAL LIGATION      Current Medications: Current Meds  Medication Sig   albuterol (PROAIR HFA) 108 (90 Base) MCG/ACT inhaler INHALE 2 PUFFS INTO THE LUNGS 3 (THREE) TIMES DAILY AS NEEDED.   alum & mag hydroxide-simeth (MAALOX PLUS) 400-400-40 MG/5ML suspension Take 10 mLs by mouth as needed for indigestion.   aspirin EC 81 MG tablet Take 1 tablet (81 mg total) by mouth daily.   carboxymethylcellulose (REFRESH PLUS) 0.5 % SOLN Place 2 drops into both eyes daily as needed (dry eyes).    diclofenac Sodium (VOLTAREN) 1 % GEL Apply 2 g topically 4 (four) times daily as needed.   ibuprofen (ADVIL) 200 MG tablet Take 200 mg by mouth every 6 (six) hours as needed.   levothyroxine (SYNTHROID) 75 MCG tablet TAKE 1 TABLET EVERY DAY ON EMPTY STOMACHWITH A GLASS OF WATER AT LEAST 30-60 MINBEFORE BREAKFAST   loratadine (CLARITIN) 10 MG tablet Take 10 mg by mouth at bedtime.    omeprazole (PRILOSEC) 20 MG capsule TAKE 1 CAPSULE BY MOUTH AT BEDTIME   vitamin B-12 (CYANOCOBALAMIN) 100 MCG tablet Take 100 mcg by mouth daily.  Allergies:   Penicillins and Latex   Social History   Socioeconomic History   Marital status: Divorced    Spouse name: Not on file   Number of children: 1   Years of education: Not on file   Highest education level: Not on file  Occupational History   Occupation: Retired historian--consultant  Tobacco Use   Smoking status: Never   Smokeless tobacco: Never  Vaping Use   Vaping Use: Never used  Substance and Sexual Activity   Alcohol use: Yes    Comment: rare wine   Drug use: No   Sexual activity: Not Currently  Other Topics Concern   Not on file  Social History Narrative    Divorced and retired , moved from [place], [place]   1 Daughter in [place]   Has living will   Daughter has health care POA.---[name]   Requests DNR--order done 06/19/12   Would not want prolonged mechanical ventilation   May accept tube feeds temporarily but not if cognitively unaware   Social Determinants of Health   Financial Resource Strain: Low Risk  (07/14/2021)   Overall Financial Resource Strain (CARDIA)    Difficulty of Paying Living Expenses: Not hard at all  Food Insecurity: No Food Insecurity (07/14/2021)   Hunger Vital Sign    Worried About Running Out of Food in the Last Year: Never true    Ran Out of Food in the Last Year: Never true  Transportation Needs: Not on file  Physical Activity: Not on file  Stress: Not on file  Social Connections: Not on file     Family History: The patient's family history includes COPD in her mother; Cancer in her mother and paternal grandmother; Diabetes in her sister; Hypertension in her sister; Stroke in an other family member; Transient ischemic attack in her mother. There is no history of Heart disease.  ROS:   Please see the history of present illness.     All other systems reviewed and are negative.  EKGs/Labs/Other Studies Reviewed:    The following studies were reviewed today:   EKG:  EKG is  ordered today.  The ekg ordered today demonstrates normal sinus rhythm, normal ECG.  Recent Labs: 05/14/2021: ALT 14; BUN 17; Creatinine, Ser 0.71; Hemoglobin 14.4; Platelets 232.0; Potassium 4.5; Sodium 140  Recent Lipid Panel    Component Value Date/Time   CHOL 172 02/04/2020 1214   TRIG 83.0 02/04/2020 1214   HDL 59.60 02/04/2020 1214   CHOLHDL 3 02/04/2020 1214   VLDL 16.6 02/04/2020 1214   LDLCALC 96 02/04/2020 1214    Physical Exam:    VS:  BP 122/68 (BP Location: Left Arm, Patient Position: Sitting, Cuff Size: Normal)   Pulse 98   Ht '5\' 5"'$  (1.651 m)   Wt 149 lb 3.2 oz (67.7 kg)   SpO2 96%   BMI  24.83 kg/m     Wt Readings from Last 3 Encounters:  02/04/22 149 lb 3.2 oz (67.7 kg)  07/29/21 149 lb (67.6 kg)  05/10/21 144 lb (65.3 kg)     GEN:  Well nourished, well developed in no acute distress HEENT: Normal NECK: No JVD; No carotid bruits CARDIAC: RRR, no murmurs, rubs, gallops RESPIRATORY:  Clear to auscultation without rales, wheezing or rhonchi  ABDOMEN: Soft, non-tender, non-distended MUSCULOSKELETAL:  No edema; right ankle in dressing/scars noted SKIN: Warm and dry NEUROLOGIC:  Alert and oriented x 3 PSYCHIATRIC:  Normal affect   ASSESSMENT:    1. Coronary artery  disease involving native coronary artery of native heart without angina pectoris   2. Gastroesophageal reflux disease with esophagitis without hemorrhage    PLAN:    In order of problems listed above:  Nonobstructive CAD, moderate LAD disease.  Denies chest pain.  Last echocardiogram showed preserved EF.  Continue aspirin, cholesterol control of statin therapy. Patient with history of reflux, symptoms are well controlled.  Continue omeprazole.  F/u yearly.  Total encounter time 35 minutes  Greater than 50% was spent in counseling and coordination of care with the patient   This note was generated in part or whole with voice recognition software. Voice recognition is usually quite accurate but there are transcription errors that can and very often do occur. I apologize for any typographical errors that were not detected and corrected.  Medication Adjustments/Labs and Tests Ordered: Current medicines are reviewed at length with the patient today.  Concerns regarding medicines are outlined above.  Orders Placed This Encounter  Procedures   EKG 12-Lead   No orders of the defined types were placed in this encounter.   Patient Instructions  Medication Instructions:   Your physician recommends that you continue on your current medications as directed. Please refer to the Current Medication list given  to you today.  *If you need a refill on your cardiac medications before your next appointment, please call your pharmacy*     Follow-Up: At [facility], you and your health needs are our priority.  As part of our continuing mission to provide you with exceptional heart care, we have created designated Provider Care Teams.  These Care Teams include your primary Cardiologist (physician) and Advanced Practice Providers (APPs -  Physician Assistants and Nurse Practitioners) who all work together to provide you with the care you need, when you need it.  We recommend signing up for the patient portal called "MyChart".  Sign up information is provided on this After Visit Summary.  MyChart is used to connect with patients for Virtual Visits (Telemedicine).  Patients are able to view lab/test results, encounter notes, upcoming appointments, etc.  Non-urgent messages can be sent to your provider as well.   To learn more about what you can do with MyChart, go to [email or web address].    Your next appointment:   1 year(s)  The format for your next appointment:   In Person  Provider:   Kate Sable, MD    Other Instructions   Important Information About Sugar         Signed, Kate Sable, MD  02/04/2022 4:53 PM    [place]

## 2022-02-08 ENCOUNTER — Encounter: Payer: Medicare Other | Admitting: Internal Medicine

## 2022-02-11 ENCOUNTER — Emergency Department: Payer: Medicare Other

## 2022-02-11 ENCOUNTER — Other Ambulatory Visit: Payer: Self-pay

## 2022-02-11 ENCOUNTER — Emergency Department
Admission: EM | Admit: 2022-02-11 | Discharge: 2022-02-11 | Disposition: A | Discharge status: Home / Self Care | Payer: Medicare Other | Attending: Emergency Medicine | Admitting: Emergency Medicine

## 2022-02-11 DIAGNOSIS — R0609 Other forms of dyspnea: Secondary | ICD-10-CM | POA: Insufficient documentation

## 2022-02-11 DIAGNOSIS — I10 Essential (primary) hypertension: Secondary | ICD-10-CM | POA: Diagnosis not present

## 2022-02-11 DIAGNOSIS — R002 Palpitations: Secondary | ICD-10-CM | POA: Diagnosis not present

## 2022-02-11 DIAGNOSIS — I4891 Unspecified atrial fibrillation: Secondary | ICD-10-CM | POA: Diagnosis not present

## 2022-02-11 DIAGNOSIS — R000 Tachycardia, unspecified: Secondary | ICD-10-CM | POA: Diagnosis not present

## 2022-02-11 LAB — URINALYSIS, ROUTINE W REFLEX MICROSCOPIC
Bilirubin Urine: NEGATIVE
Glucose, UA: NEGATIVE mg/dL
Hgb urine dipstick: NEGATIVE
Ketones, ur: 5 mg/dL — AB
Nitrite: NEGATIVE
Protein, ur: NEGATIVE mg/dL
Specific Gravity, Urine: 1.011 (ref 1.005–1.030)
pH: 5 (ref 5.0–8.0)

## 2022-02-11 LAB — CBC WITH DIFFERENTIAL/PLATELET
Abs Immature Granulocytes: 0.01 10*3/uL (ref 0.00–0.07)
Basophils Absolute: 0.1 10*3/uL (ref 0.0–0.1)
Basophils Relative: 1 %
Eosinophils Absolute: 0.1 10*3/uL (ref 0.0–0.5)
Eosinophils Relative: 2 %
HCT: 42 % (ref 36.0–46.0)
Hemoglobin: 13.8 g/dL (ref 12.0–15.0)
Immature Granulocytes: 0 %
Lymphocytes Relative: 17 %
Lymphs Abs: 1 10*3/uL (ref 0.7–4.0)
MCH: 31.1 pg (ref 26.0–34.0)
MCHC: 32.9 g/dL (ref 30.0–36.0)
MCV: 94.6 fL (ref 80.0–100.0)
Monocytes Absolute: 0.7 10*3/uL (ref 0.1–1.0)
Monocytes Relative: 12 %
Neutro Abs: 4.1 10*3/uL (ref 1.7–7.7)
Neutrophils Relative %: 68 %
Platelets: 256 10*3/uL (ref 150–400)
RBC: 4.44 MIL/uL (ref 3.87–5.11)
RDW: 12.7 % (ref 11.5–15.5)
WBC: 6 10*3/uL (ref 4.0–10.5)
nRBC: 0 % (ref 0.0–0.2)

## 2022-02-11 LAB — COMPREHENSIVE METABOLIC PANEL
ALT: 15 U/L (ref 0–44)
AST: 25 U/L (ref 15–41)
Albumin: 3.5 g/dL (ref 3.5–5.0)
Alkaline Phosphatase: 56 U/L (ref 38–126)
Anion gap: 6 (ref 5–15)
BUN: 20 mg/dL (ref 8–23)
CO2: 21 mmol/L — ABNORMAL LOW (ref 22–32)
Calcium: 8.3 mg/dL — ABNORMAL LOW (ref 8.9–10.3)
Chloride: 114 mmol/L — ABNORMAL HIGH (ref 98–111)
Creatinine, Ser: 0.93 mg/dL (ref 0.44–1.00)
GFR, Estimated: 60 mL/min — ABNORMAL LOW (ref >60)
Glucose, Bld: 108 mg/dL — ABNORMAL HIGH (ref 70–99)
Potassium: 3.8 mmol/L (ref 3.5–5.1)
Sodium: 141 mmol/L (ref 135–145)
Total Bilirubin: 0.7 mg/dL (ref 0.3–1.2)
Total Protein: 6.3 g/dL — ABNORMAL LOW (ref 6.5–8.1)

## 2022-02-11 LAB — MAGNESIUM: Magnesium: 2.1 mg/dL (ref 1.7–2.4)

## 2022-02-11 LAB — BRAIN NATRIURETIC PEPTIDE: B Natriuretic Peptide: 125.2 pg/mL — ABNORMAL HIGH (ref 0.0–100.0)

## 2022-02-11 LAB — TROPONIN I (HIGH SENSITIVITY)
Troponin I (High Sensitivity): 37 ng/L — ABNORMAL HIGH (ref <18)
Troponin I (High Sensitivity): 156 ng/L (ref <18)

## 2022-02-11 LAB — T4, FREE: Free T4: 1 ng/dL (ref 0.61–1.12)

## 2022-02-11 LAB — TSH: TSH: 4.237 u[IU]/mL (ref 0.350–4.500)

## 2022-02-11 MED ORDER — APIXABAN 5 MG PO TABS: 5.0000 mg | ORAL_TABLET | Freq: Two times a day (BID) | ORAL | 0 refills | Status: DC

## 2022-02-11 MED ORDER — DILTIAZEM HCL 25 MG/5ML IV SOLN: 10.0000 mg | Freq: Once | INTRAVENOUS | Status: DC

## 2022-02-11 MED ORDER — AMIODARONE HCL 200 MG PO TABS
400.0000 mg | ORAL_TABLET | Freq: Every day | ORAL | Status: DC
  Administered 2022-02-11: 400 mg via ORAL
  Filled 2022-02-11: qty 2

## 2022-02-11 MED ORDER — SODIUM CHLORIDE 0.9 % IV BOLUS
500.0000 mL | Freq: Once | INTRAVENOUS | Status: AC
  Administered 2022-02-11: 500 mL via INTRAVENOUS

## 2022-02-11 MED ORDER — AMIODARONE HCL 200 MG PO TABS: ORAL_TABLET | ORAL | 0 refills | Status: DC

## 2022-02-11 MED ORDER — AMIODARONE HCL IN DEXTROSE 360-4.14 MG/200ML-% IV SOLN: 30.0000 mg/h | INTRAVENOUS | Status: DC

## 2022-02-11 MED ORDER — AMIODARONE HCL IN DEXTROSE 360-4.14 MG/200ML-% IV SOLN
60.0000 mg/h | INTRAVENOUS | Status: DC
  Administered 2022-02-11: 60 mg/h via INTRAVENOUS
  Filled 2022-02-11: qty 200

## 2022-02-11 MED ORDER — AMIODARONE LOAD VIA INFUSION
150.0000 mg | Freq: Once | INTRAVENOUS | Status: AC
  Administered 2022-02-11: 150 mg via INTRAVENOUS
  Filled 2022-02-11: qty 83.34

## 2022-02-11 NOTE — ED Triage Notes (Signed)
Pt. To ED from [facility] for palpitations and nausea this AM. Pt. States she woke up with it. Medic reports a-fib RVR today, rates 155-177bpm enroute.  Pt. Denies pain, or SOB. Pt. Reports hx of irregular rhythm 7-8 years ago. 475m NS  in 20g LFA en route. Pt. Conversational, speaking full sentences, NAD in triage.

## 2022-02-11 NOTE — ED Notes (Signed)
Dr. Jari Pigg aware pt's BP

## 2022-02-11 NOTE — ED Notes (Signed)
ED Provider at bedside. 

## 2022-02-11 NOTE — Discharge Instructions (Addendum)
Take the blood thinner eliquis  to help prevent stroke.  You can hold your aspirin from now until you discuss with cardiology to see if they would want you to continue the aspirin as well.  Do not take any ibuprofen, other blood thinners as this can increase risk for bleeding.  If you have a fall and hit your head or you are in a car accident you are high risk for bleeding as he should come to the ER to be evaluated.  The amiodarone is to try to prevent your heart rate from going back into atrial fibrillation.  You should start off with taking 2 tablets for the next 6 days and then go down to 1 tablet.  They are going to call your cardiologist to make a follow-up appointment but if you do not hear from him please call to make a follow-up appointment and return to the ER if you develop return of chest pain or shortness of breath or any other concerns

## 2022-02-11 NOTE — ED Provider Notes (Signed)
[facility] Provider Note    Event Date/Time   First MD Initiated Contact with Patient 02/11/22 1018     (approximate)   History   Palpitations (Pt. To ED from [facility] for palpitations and nausea this AM. Pt. States she woke up with it. Medic reports a-fib RVR today, rates 155-177bpm enroute.  Pt. Denies pain, or SOB. Pt. Reports hx of irregular rhythm 7-8 years ago. 449m NS  in 20g LFA en route. Pt. Conversational, speaking full sentences, NAD in triage.)   HPI  Ariel SCHLICKERis a 88y.o. female [facility].  Patient reports that she currently does not have any symptoms but her heart rates are running in the 150s.  She does report a remote history years ago of a rapid heart rate but she is not sure if it was A-fib or not.  Never has had any issues since then.  Patient denies any chest pain or shortness of breath.  She reports waking up around 8 AM and having some pain across her chest and some shortness of breath as well as some palpitations.  She reports the symptoms have since resolved.  She denies ever having a history of known atrial fibrillation.  Physical Exam   Triage Vital Signs: ED Triage Vitals  Enc Vitals Group     BP --      Pulse Rate 02/11/22 1011 (!) 139     Resp 02/11/22 1011 18     Temp 02/11/22 1011 98.3 F (36.8 C)     Temp Source 02/11/22 1011 Oral     SpO2 02/11/22 1011 97 %     Weight 02/11/22 1013 156 lb 15.5 oz (71.2 kg)     Height 02/11/22 1013 '5\' 2"'$  (1.575 m)     Head Circumference --      Peak Flow --      Pain Score 02/11/22 1012 0     Pain Loc --      Pain Edu? --      [place]     Most recent vital signs: Vitals:   02/11/22 1011  Pulse: (!) 139  Resp: 18  Temp: 98.3 F (36.8 C)  SpO2: 97%     General: Awake, no distress.  CV:  Good peripheral perfusion.  Irregular, tachycardic Resp:  Normal effort.   Abd:  No distention.  Other:  No swelling the legs.  No calf tenderness   ED Results / Procedures / Treatments   Labs (all labs ordered are listed, but only abnormal results are displayed) Labs Reviewed  CBC WITH DIFFERENTIAL/PLATELET  COMPREHENSIVE METABOLIC PANEL  TSH  T4, FREE  URINALYSIS, ROUTINE W REFLEX MICROSCOPIC  MAGNESIUM  BRAIN NATRIURETIC PEPTIDE     EKG  My interpretation of EKG:  Atrial fibrillation rate of 155 without any ST elevation or T wave inversions, normal intervals  EKG normal sinus rate of 96 without any ST elevation or T wave inversions, normal intervals  RADIOLOGY I have reviewed the xray personally and interpreted no pulmonary edema  PROCEDURES:  Critical Care performed: Yes, see critical care procedure note(s)  .1-3 Lead EKG Interpretation  Performed by: FVanessa [place] MD Authorized by: FVanessa [place] MD     Interpretation: abnormal     ECG rate:  150   ECG rate assessment: tachycardic     Rhythm: atrial fibrillation  Ectopy: none     Conduction: normal   .Critical Care  Performed by: Vanessa [place], MD Authorized by: Vanessa , MD   Critical care provider statement:    Critical care time (minutes):  30   Critical care was necessary to treat or prevent imminent or life-threatening deterioration of the following conditions:  Circulatory failure   Critical care was time spent personally by me on the following activities:  Development of treatment plan with patient or surrogate, discussions with consultants, evaluation of patient's response to treatment, examination of patient, ordering and review of laboratory studies, ordering and review of radiographic studies, ordering and performing treatments and interventions, pulse oximetry, re-evaluation of patient's condition and review of old charts    MEDICATIONS ORDERED IN ED: Medications  amiodarone (PACERONE) tablet 400 mg (400 mg Oral Given 02/11/22 1218)  sodium chloride 0.9 %  bolus 500 mL (0 mLs Intravenous Stopped 02/11/22 1046)  amiodarone (NEXTERONE) 1.8 mg/mL load via infusion 150 mg (150 mg Intravenous Bolus from Bag 02/11/22 1139)     IMPRESSION / MDM / [place] / ED COURSE  I reviewed the triage vital signs and the nursing notes.   Patient's presentation is most consistent with acute presentation with potential threat to life or bodily function.  Patient comes in with new A-fib with RVR.  Patient's slightly hypotensive therefore not a candidate for beta-blocker or diltiazem.  We discussed the risk for stroke with cardioversion versus amiodarone.  Discussed with Dr. Rockey Situ and given this does sound like it was sudden in onset did recommend trialing 1 of these.  After discussion with patient she preferred to do the amiodarone.  Patient did convert after amiodarone into sinus rhythm.  She is been off the amiodarone for a few hours without any additional atrial fibrillation.  I discussed with Dr. Rockey Situ who recommended 400 mg of amnio for 1 week and then continuing 200 mg daily after that.  Also recommended anticoagulation.  Patient denies any history of bleeding issues or recurrent falls.  I reviewed patient's note from 02/04/2022 where she was in normal sinus at that time.  Patient troponin went from 37-1 50 I discussed with Dr. Rockey Situ seems more likely demand from the A-fib.  Patient is currently chest pain-free.  I discussed with patient admission for observation but given no chest pain and no stress test done on the weekend patient is okay following up outpatient.  BNP slightly elevated most likely demand.  CBC reassuring.  CMP reassuring.  Thyroid reassuring.  Again on repeat evaluation she continues to deny any chest pain or shortness of breath.  She reports resolution of symptoms.  I have attempted to call patient's daughter x2 without any answer   The patient is on the cardiac monitor to evaluate for evidence of arrhythmia and/or significant  heart rate changes.      FINAL CLINICAL IMPRESSION(S) / ED DIAGNOSES   Final diagnoses:  Atrial fibrillation with RVR ([place])     Rx / DC Orders   ED Discharge Orders          Ordered    amiodarone (PACERONE) 200 MG tablet  Daily        02/11/22 1458    apixaban (ELIQUIS) 5 MG TABS tablet  2 times daily        02/11/22 1458             Note:  This document was prepared using Dragon voice recognition software and may include unintentional  dictation errors.   Vanessa [place], MD 02/11/22 1459

## 2022-02-15 DIAGNOSIS — Z23 Encounter for immunization: Secondary | ICD-10-CM | POA: Diagnosis not present

## 2022-02-16 ENCOUNTER — Ambulatory Visit (INDEPENDENT_AMBULATORY_CARE_PROVIDER_SITE_OTHER): Payer: Medicare Other | Admitting: Internal Medicine

## 2022-02-16 ENCOUNTER — Telehealth: Payer: Self-pay

## 2022-02-16 ENCOUNTER — Encounter: Payer: Self-pay | Admitting: Internal Medicine

## 2022-02-16 VITALS — BP 112/80 | HR 82 | Temp 97.5°F | Ht 63.0 in | Wt 147.0 lb

## 2022-02-16 DIAGNOSIS — E039 Hypothyroidism, unspecified: Secondary | ICD-10-CM | POA: Diagnosis not present

## 2022-02-16 DIAGNOSIS — I480 Paroxysmal atrial fibrillation: Secondary | ICD-10-CM

## 2022-02-16 DIAGNOSIS — I2510 Atherosclerotic heart disease of native coronary artery without angina pectoris: Secondary | ICD-10-CM | POA: Diagnosis not present

## 2022-02-16 DIAGNOSIS — Z0000 Encounter for general adult medical examination without abnormal findings: Secondary | ICD-10-CM

## 2022-02-16 DIAGNOSIS — D6869 Other thrombophilia: Secondary | ICD-10-CM | POA: Diagnosis not present

## 2022-02-16 DIAGNOSIS — I700 Atherosclerosis of aorta: Secondary | ICD-10-CM | POA: Diagnosis not present

## 2022-02-16 DIAGNOSIS — F39 Unspecified mood [affective] disorder: Secondary | ICD-10-CM | POA: Diagnosis not present

## 2022-02-16 DIAGNOSIS — G3184 Mild cognitive impairment, so stated: Secondary | ICD-10-CM

## 2022-02-16 DIAGNOSIS — K219 Gastro-esophageal reflux disease without esophagitis: Secondary | ICD-10-CM | POA: Diagnosis not present

## 2022-02-16 HISTORY — DX: Paroxysmal atrial fibrillation: I48.0

## 2022-02-16 NOTE — Assessment & Plan Note (Signed)
Has some dysthymia--mostly reactive Okay without meds

## 2022-02-16 NOTE — Assessment & Plan Note (Signed)
ER visit 5 days ago Converted with amiodarone--switching to '200mg'$  daily Going to cardiology---will need echo On eliquis 5 bid

## 2022-02-16 NOTE — Assessment & Plan Note (Signed)
Had already been bruising before eliquis---now worse Will stop the ASA

## 2022-02-16 NOTE — Assessment & Plan Note (Signed)
Okay on omeprazole daily

## 2022-02-16 NOTE — Progress Notes (Signed)
Hearing Screening - Comments:: Has hearing aids. Wearing them today. Vision Screening - Comments:: April 2023

## 2022-02-16 NOTE — Telephone Encounter (Signed)
        Patient  visited [facility] on 10/13    Telephone encounter attempt :    A HIPAA compliant voice message was left requesting a return call.  Instructed patient to call back .    [place], Care Management  [phone] [address], [place], [place] [address] Phone: [phone] Email: [email or web address]

## 2022-02-16 NOTE — Progress Notes (Signed)
Subjective:    Patient ID: Ariel Phillips, female    DOB: 1932-09-17, 88 y.o.   MRN: [id number]  HPI Here for Medicare wellness visit and follow up of chronic health conditions Reviewed advanced directives Reviewed other doctors--Dr Ariel Phillips--cardiology, Dr Ariel Phillips--neurology, DR Ariel Phillips--ophtho, Dr Ariel Phillips--GI, Dr Ariel Phillips No hospitalizations or surgery in the past year Vision is stable---left is still bad (coloboma) Wears hearing aides Rare alcohol--socially No tobacco No falls Independent with instrumental ADLs Mild memory issues  Was in ER all day 10/13---awoke with upper chest pain Called EMS---found to be in atrial fibrillation Started on amiodarone----back into regular rhythm Given eliquis also No palpitations now (or then) No recurrence of pain No SOB Still walks regularly No edema No dizziness or syncope  Some problems with balance Relates that to sequelae from past broken ankle  Ongoing mild memory issues Sees neurology No functional decline--still does all instrumental ADLs Going for driving evaluation tomorrow  Chronic diarrhea and abdominal discomfort--no blood No Rx for now Continues on omeprazole daily for heartburn No dysphagia  Continues on thyroid med  Feels a little down at times Is now moved out of her apartment--for a remodel  Usually okay No anhedonia No problems with anxiety---but worries about losing driving privileges, etc  Current Outpatient Medications on File Prior to Visit  Medication Sig Dispense Refill   albuterol (PROAIR HFA) 108 (90 Base) MCG/ACT inhaler INHALE 2 PUFFS INTO THE LUNGS 3 (THREE) TIMES DAILY AS NEEDED.     alum & mag hydroxide-simeth (MAALOX PLUS) 400-400-40 MG/5ML suspension Take 10 mLs by mouth as needed for indigestion.     amiodarone (PACERONE) 200 MG tablet Take 2 tablets (400 mg total) by mouth daily for 6 days, THEN 1 tablet (200 mg total) daily for 24 days. 36 tablet 0    apixaban (ELIQUIS) 5 MG TABS tablet Take 1 tablet (5 mg total) by mouth 2 (two) times daily. 60 tablet 0   aspirin EC 81 MG tablet Take 1 tablet (81 mg total) by mouth daily.     carboxymethylcellulose (REFRESH PLUS) 0.5 % SOLN Place 2 drops into both eyes daily as needed (dry eyes).      diclofenac Sodium (VOLTAREN) 1 % GEL Apply 2 g topically 4 (four) times daily as needed. 150 g 5   levothyroxine (SYNTHROID) 75 MCG tablet TAKE 1 TABLET EVERY DAY ON EMPTY STOMACHWITH A GLASS OF WATER AT LEAST 30-60 MINBEFORE BREAKFAST 90 tablet 3   loratadine (CLARITIN) 10 MG tablet Take 10 mg by mouth at bedtime.      omeprazole (PRILOSEC) 20 MG capsule TAKE 1 CAPSULE BY MOUTH AT BEDTIME 90 capsule 3   vitamin B-12 (CYANOCOBALAMIN) 100 MCG tablet Take 100 mcg by mouth daily.     No current facility-administered medications on file prior to visit.    Allergies  Allergen Reactions   Penicillins Other (See Comments)    Did it involve swelling of the face/tongue/throat, SOB, or low BP? No Did it involve sudden or severe rash/hives, skin peeling, or any reaction on the inside of your mouth or nose? No Did you need to seek medical attention at a hospital or doctor's office? Yes When did it last happen?      20+ years If all above answers are "NO", may proceed with cephalosporin use.  Pt does not remember the reaction   Latex Rash    Past Medical History:  Diagnosis Date   Allergic asthma    Allergic rhinitis due to  pollen    Allergy    GERD (gastroesophageal reflux disease)    IBS (irritable bowel syndrome)    Osteopenia    Unspecified hypothyroidism     Past Surgical History:  Procedure Laterality Date   ANAL FISSURE REPAIR  1950's   BASAL CELL CARCINOMA EXCISION  2000's   nose   CHOLECYSTECTOMY  2008   COLONOSCOPY  Multiple   Negative screening exams   ENDOSCOPIC RETROGRADE CHOLANGIOPANCREATOGRAPHY (ERCP) WITH PROPOFOL N/A 02/13/2019   Procedure: ENDOSCOPIC RETROGRADE  CHOLANGIOPANCREATOGRAPHY (ERCP) WITH PROPOFOL;  Surgeon: Ariel Mayer, MD;  Location: [facility];  Service: Endoscopy;  Laterality: N/A;   [name] or so   with fissure repair   ORIF ANKLE FRACTURE Right 06/27/2020   Procedure: OPEN REDUCTION INTERNAL FIXATION (ORIF) ANKLE FRACTURE;  Surgeon: Ariel Phillips, DPM;  Location: [facility];  Service: Podiatry;  Laterality: Right;   REMOVAL OF STONES  02/13/2019   Procedure: REMOVAL OF STONES;  Surgeon: Ariel Mayer, MD;  Location: [facility];  Service: Endoscopy;;   SPHINCTEROTOMY  02/13/2019   Procedure: Ariel Phillips;  Surgeon: Ariel Mayer, MD;  Location: [facility];  Service: Endoscopy;;   TUBAL LIGATION      Family History  Problem Relation Age of Onset   COPD Mother    Cancer Mother        unclear diagnosis   Transient ischemic attack Mother    Diabetes Sister    Hypertension Sister    Stroke Other    Cancer Paternal Grandmother        ?breast   Heart disease Neg Hx     Social History   Socioeconomic History   Marital status: Divorced    Spouse name: Not on file   Number of children: 1   Years of education: Not on file   Highest education level: Not on file  Occupational History   Occupation: Retired historian--consultant  Tobacco Use   Smoking status: Never    Passive exposure: Past   Smokeless tobacco: Never  Vaping Use   Vaping Use: Never used  Substance and Sexual Activity   Alcohol use: Yes    Comment: rare wine   Drug use: No   Sexual activity: Not Currently  Other Topics Concern   Not on file  Social History Narrative   Divorced and retired , moved from [place], [place]   1 Daughter in [place]   Has living will   Daughter has health care POA.---Ariel Phillips   Requests DNR--order done 06/19/12   Would not want prolonged mechanical ventilation   May accept tube feeds temporarily but not if cognitively unaware   Social Determinants of Health   Financial Resource  Strain: Low Risk  (07/14/2021)   Overall Financial Resource Strain (CARDIA)    Difficulty of Paying Living Expenses: Not hard at all  Food Insecurity: No Food Insecurity (07/14/2021)   Hunger Vital Sign    Worried About Running Out of Food in the Last Year: Never true    Ran Out of Food in the Last Year: Never true  Transportation Needs: Not on file  Physical Activity: Not on file  Stress: Not on file  Social Connections: Not on file  Intimate Partner Violence: Not on file   Review of Systems Appetite is okay Weight stable Sleeps was usually okay---going earlier now. Not as good  Wears seat belt Teeth okay---keeps up with dentist No suspicious skin lesions Voids okay--Phillips nocturia recently No sig back or joint  pains    Objective:   Physical Exam Constitutional:      Appearance: Normal appearance.  Eyes:     Conjunctiva/sclera: Conjunctivae normal.     Comments: Normal pupil on right   Cardiovascular:     Rate and Rhythm: Normal rate and regular rhythm.     Pulses: Normal pulses.     Heart sounds: No murmur heard.    No gallop.  Pulmonary:     Effort: Pulmonary effort is normal.     Breath sounds: Normal breath sounds. No wheezing or rales.  Abdominal:     Palpations: Abdomen is soft.     Tenderness: There is no abdominal tenderness.  Musculoskeletal:     Cervical back: Neck supple.     Right lower leg: No edema.     Left lower leg: No edema.  Lymphadenopathy:     Cervical: No cervical adenopathy.  Skin:    Findings: No lesion or rash.     Comments: Scattered ecchymoses  Neurological:     General: No focal deficit present.     Mental Status: She is alert and oriented to person, place, and time.  Psychiatric:        Mood and Affect: Mood normal.        Behavior: Behavior normal.            Assessment & Plan:

## 2022-02-16 NOTE — Assessment & Plan Note (Signed)
Labs normal last week On levothyroixine 75

## 2022-02-16 NOTE — Assessment & Plan Note (Signed)
On imaging No statin with age

## 2022-02-16 NOTE — Assessment & Plan Note (Signed)
Has had thorough evaluation No functional decline

## 2022-02-16 NOTE — Assessment & Plan Note (Signed)
I have personally reviewed the Medicare Annual Wellness questionnaire and have noted 1. The patient's medical and social history 2. Their use of alcohol, tobacco or illicit drugs 3. Their current medications and supplements 4. The patient's functional ability including ADL's, fall risks, home safety risks and hearing or visual             impairment. 5. Diet and physical activities 6. Evidence for depression or mood disorders  The patients weight, height, BMI and visual acuity have been recorded in the chart I have made referrals, counseling and provided education to the patient based review of the above and I have provided the pt with a written personalized care plan for preventive services.  I have provided you with a copy of your personalized plan for preventive services. Please take the time to review along with your updated medication list.  Done with cancer screening Just had flu vaccine Needs COVID vaccine

## 2022-02-21 ENCOUNTER — Telehealth: Payer: Self-pay

## 2022-02-21 DIAGNOSIS — H6123 Impacted cerumen, bilateral: Secondary | ICD-10-CM | POA: Diagnosis not present

## 2022-02-21 DIAGNOSIS — J309 Allergic rhinitis, unspecified: Secondary | ICD-10-CM | POA: Diagnosis not present

## 2022-02-21 NOTE — Progress Notes (Addendum)
Chronic Care Management Pharmacy Assistant   Name: SIBLEY ROLISON  MRN: [id number] DOB: 06/01/1932  Reason for Encounter: CCM (Hosptial Follow Up)  Medications: Outpatient Encounter Medications as of 02/21/2022  Medication Sig   albuterol (PROAIR HFA) 108 (90 Base) MCG/ACT inhaler INHALE 2 PUFFS INTO THE LUNGS 3 (THREE) TIMES DAILY AS NEEDED.   alum & mag hydroxide-simeth (MAALOX PLUS) 400-400-40 MG/5ML suspension Take 10 mLs by mouth as needed for indigestion.   amiodarone (PACERONE) 200 MG tablet Take 2 tablets (400 mg total) by mouth daily for 6 days, THEN 1 tablet (200 mg total) daily for 24 days.   apixaban (ELIQUIS) 5 MG TABS tablet Take 1 tablet (5 mg total) by mouth 2 (two) times daily.   carboxymethylcellulose (REFRESH PLUS) 0.5 % SOLN Place 2 drops into both eyes daily as needed (dry eyes).    diclofenac Sodium (VOLTAREN) 1 % GEL Apply 2 g topically 4 (four) times daily as needed.   levothyroxine (SYNTHROID) 75 MCG tablet TAKE 1 TABLET EVERY DAY ON EMPTY STOMACHWITH A GLASS OF WATER AT LEAST 30-60 MINBEFORE BREAKFAST   loratadine (CLARITIN) 10 MG tablet Take 10 mg by mouth at bedtime.    omeprazole (PRILOSEC) 20 MG capsule TAKE 1 CAPSULE BY MOUTH AT BEDTIME   vitamin B-12 (CYANOCOBALAMIN) 100 MCG tablet Take 100 mcg by mouth daily.   No facility-administered encounter medications on file as of 02/21/2022.   Reviewed hospital notes for details of recent visit. Has patient been contacted by Transitions of Care team? No Has patient seen PCP/specialist for hospital follow up (summarize OV if yes): Yes 02/16/22 [name], MD [facility] Stop: Aspirin 81 mg "ER visit 5 days ago Converted with amiodarone--switching to '200mg'$  daily Going to cardiology---will need echo On eliquis 5 bid"  Admitted to the ED on 02/11/2022. Discharge date was 02/11/2022.  Discharged from [facility]   Discharge diagnosis (Principal Problem): Atrial fibrillation with RVR  Patient  was discharged to [facility]  Brief summary of hospital course: Ariel Phillips is a 88 y.o. female who comes in from [facility] independent living for palpitations and nausea this morning.  Patient reports that she currently does not have any symptoms but her heart rates are running in the 150s.  She does report a remote history years ago of a rapid heart rate but she is not sure if it was A-fib or not.  Never has had any issues since then.  Patient denies any chest pain or shortness of breath.  She reports waking up around 8 AM and having some pain across her chest and some shortness of breath as well as some palpitations.  She reports the symptoms have since resolved.  She denies ever having a history of known atrial fibrillation.  Patient's presentation is most consistent with acute presentation with potential threat to life or bodily function.   Patient comes in with new A-fib with RVR.  Patient's slightly hypotensive therefore not a candidate for beta-blocker or diltiazem.  We discussed the risk for stroke with cardioversion versus amiodarone.  Discussed with Dr. Rockey Situ and given this does sound like it was sudden in onset did recommend trialing 1 of these.  After discussion with patient she preferred to do the amiodarone.  Patient did convert after amiodarone into sinus rhythm.  She is been off the amiodarone for a few hours without any additional atrial fibrillation.  I discussed with Dr. Rockey Situ who recommended 400 mg of amnio for 1  week and then continuing 200 mg daily after that.  Also recommended anticoagulation.  Patient denies any history of bleeding issues or recurrent falls.   I reviewed patient's note from 02/04/2022 where she was in normal sinus at that time.   Patient troponin went from 37-1 63 I discussed with Dr. Rockey Situ seems more likely demand from the A-fib.  Patient is currently chest pain-free.  I discussed with patient admission for observation but given no chest  pain and no stress test done on the weekend patient is okay following up outpatient.  BNP slightly elevated most likely demand.  CBC reassuring.  CMP reassuring.  Thyroid reassuring.  Again on repeat evaluation she continues to deny any chest pain or shortness of breath.  She reports resolution of symptoms.   New Medications Started at [facility] Discharge:?? -Started amiodarone (PACERONE) 200 MG tablet -Started apixaban (ELIQUIS) 5 MG TABS tablet  Medication Changes at Hospital Discharge: No changes  Medications Discontinued at Hospital Discharge: -Stopped ibuprofen (ADVIL) 200 MG tablet  Medications that remain the same after Hospital Discharge:??  -All other medications will remain the same.    Next CCM appt: Non scheduled  Other upcoming appts: Cardiology appointment on 02/23/2022  Charlene Brooke, PharmD notified and will determine if action is needed.  Charlene Brooke, CPP notified  Marijean Niemann, [place] Clinical Pharmacy Assistant [phone]   Pharmacist addendum: Patient has seen PCP and cardiologist, ECHO scheduled for November. CCM team will follow up within the next 30 days regarding new Eliquis start.  Charlene Brooke, PharmD, BCACP 03/01/22 9:30 AM

## 2022-02-23 ENCOUNTER — Ambulatory Visit: Payer: Medicare Other | Attending: Cardiology | Admitting: Cardiology

## 2022-02-23 ENCOUNTER — Encounter: Payer: Self-pay | Admitting: Cardiology

## 2022-02-23 VITALS — BP 110/60 | HR 83 | Ht 68.0 in | Wt 148.5 lb

## 2022-02-23 DIAGNOSIS — I4891 Unspecified atrial fibrillation: Secondary | ICD-10-CM | POA: Diagnosis not present

## 2022-02-23 DIAGNOSIS — K2100 Gastro-esophageal reflux disease with esophagitis, without bleeding: Secondary | ICD-10-CM

## 2022-02-23 DIAGNOSIS — I2510 Atherosclerotic heart disease of native coronary artery without angina pectoris: Secondary | ICD-10-CM

## 2022-02-23 NOTE — Patient Instructions (Signed)
Medication Instructions:  No changes at this time.   *If you need a refill on your cardiac medications before your next appointment, please call your pharmacy*   Lab Work: None  If you have labs (blood work) drawn today and your tests are completely normal, you will receive your results only by: [place] (if you have MyChart) OR A paper copy in the mail If you have any lab test that is abnormal or we need to change your treatment, we will call you to review the results.   Testing/Procedures: Your physician has requested that you have an echocardiogram. Echocardiography is a painless test that uses sound waves to create images of your heart. It provides your doctor with information about the size and shape of your heart and how well your heart's chambers and valves are working. This procedure takes approximately one hour. There are no restrictions for this procedure. Please do NOT wear cologne, perfume, aftershave, or lotions (deodorant is allowed). Please arrive 15 minutes prior to your appointment time.    Follow-Up: At [facility], you and your health needs are our priority.  As part of our continuing mission to provide you with exceptional heart care, we have created designated Provider Care Teams.  These Care Teams include your primary Cardiologist (physician) and Advanced Practice Providers (APPs -  Physician Assistants and Nurse Practitioners) who all work together to provide you with the care you need, when you need it.   Your next appointment:   2 month(s)  The format for your next appointment:   In Person  Provider:   Kate Sable, MD        Important Information About Sugar

## 2022-02-23 NOTE — Progress Notes (Signed)
Cardiology Clinic Note   Patient Name: Ariel Phillips Date of Encounter: 02/23/2022  Primary Care Provider:  Venia Carbon, MD Primary Cardiologist:  Kate Sable, MD  Patient Profile    88 year old female with past medical history of nonobstructive CAD and gastroesophageal reflux disease, who returns today for post hospital follow-up.  Past Medical History    Past Medical History:  Diagnosis Date   Allergic asthma    Allergic rhinitis due to pollen    Allergy    GERD (gastroesophageal reflux disease)    IBS (irritable bowel syndrome)    Osteopenia    Unspecified hypothyroidism    Past Surgical History:  Procedure Laterality Date   ANAL FISSURE REPAIR  1950's   BASAL CELL CARCINOMA EXCISION  2000's   nose   CHOLECYSTECTOMY  2008   COLONOSCOPY  Multiple   Negative screening exams   ENDOSCOPIC RETROGRADE CHOLANGIOPANCREATOGRAPHY (ERCP) WITH PROPOFOL N/A 02/13/2019   Procedure: ENDOSCOPIC RETROGRADE CHOLANGIOPANCREATOGRAPHY (ERCP) WITH PROPOFOL;  Surgeon: Gatha Mayer, MD;  Location: [facility];  Service: Endoscopy;  Laterality: N/A;   [place] or so   with fissure repair   ORIF ANKLE FRACTURE Right 06/27/2020   Procedure: OPEN REDUCTION INTERNAL FIXATION (ORIF) ANKLE FRACTURE;  Surgeon: Caroline More, DPM;  Location: [facility];  Service: Podiatry;  Laterality: Right;   REMOVAL OF STONES  02/13/2019   Procedure: REMOVAL OF STONES;  Surgeon: Gatha Mayer, MD;  Location: [facility];  Service: Endoscopy;;   SPHINCTEROTOMY  02/13/2019   Procedure: [name];  Surgeon: Gatha Mayer, MD;  Location: [facility];  Service: Endoscopy;;   TUBAL LIGATION      Allergies  Allergies  Allergen Reactions   Penicillins Other (See Comments)    Did it involve swelling of the face/tongue/throat, SOB, or low BP? No Did it involve sudden or severe rash/hives, skin peeling, or any reaction on the inside of your mouth or nose? No Did you  need to seek medical attention at a hospital or doctor's office? Yes When did it last happen?      20+ years If all above answers are "NO", may proceed with cephalosporin use.  Pt does not remember the reaction   Latex Rash    History of Present Illness    Ariel Phillips is an 88 year old female with a past medical history of nonobstructive CAD and gastroesophageal reflux disease.  She had an echocardiogram completed in 07/2019 which showed normal systolic function, impaired relaxation, with a LVEF of 60 to 65%.  She also underwent coronary CTA which showed calcium score of 200, moderate LAD disease, FFR CT did not show any significant stenosis in the LAD.  FFR CT showed stenosis in diagonal 1, which was noted to be a small vessel.  She was last seen in clinic on 02/04/2022 by Dr. Garen Lah and was feeling well with no concerns at that time she had denied chest pain and shortness of breath.  She was continued on aspirin and statin therapy as well as a PPI for her reflux with return in 1 year.  She presented to the [facility] emergency department on 02/11/2022 with complaints of palpitations.  The medic reported that he noted atrial fibrillation RVR today with rates of 1 55-1 77 in route to the facility.  EKG on arrival revealed atrial fibrillation with a rate of 150 5 repeat EKG later on during her emergency department stay revealed normal sinus rhythm with rate of 96 without  any ST elevation or T wave inversions.  She was slightly hypotensive at that time and was not a candidate for beta-blocker therapy or diltiazem so she was started on amiodarone which did convert her into sinus rhythm.  After being off of amiodarone for few hours without any additional atrial fibrillation she was continued with oral amiodarone 200 mg daily as well as she was started on apixaban 5 mg twice daily for CHA2DS2-VASc score of 3 (age and sex).  She returns clinic today feeling well. She has been tolerating her apixaban  without any bleeding episodes. She remains in sinus. Denies any chest pain, shortness of breath, palpitations, or peripheral edema. She has recently had a follow up with her PCP. She was continued on amiodarone at 200 mg daily.  Continues to reside at [facility].  Home Medications    Current Outpatient Medications  Medication Sig Dispense Refill   albuterol (PROAIR HFA) 108 (90 Base) MCG/ACT inhaler INHALE 2 PUFFS INTO THE LUNGS 3 (THREE) TIMES DAILY AS NEEDED.     alum & mag hydroxide-simeth (MAALOX PLUS) 400-400-40 MG/5ML suspension Take 10 mLs by mouth as needed for indigestion.     amiodarone (PACERONE) 200 MG tablet Take 2 tablets (400 mg total) by mouth daily for 6 days, THEN 1 tablet (200 mg total) daily for 24 days. (Patient taking differently: Takes 1 tablet by mouth daily.) 36 tablet 0   apixaban (ELIQUIS) 5 MG TABS tablet Take 1 tablet (5 mg total) by mouth 2 (two) times daily. 60 tablet 0   carboxymethylcellulose (REFRESH PLUS) 0.5 % SOLN Place 2 drops into both eyes daily as needed (dry eyes).      diclofenac Sodium (VOLTAREN) 1 % GEL Apply 2 g topically 4 (four) times daily as needed. 150 g 5   levothyroxine (SYNTHROID) 75 MCG tablet TAKE 1 TABLET EVERY DAY ON EMPTY STOMACHWITH A GLASS OF WATER AT LEAST 30-60 MINBEFORE BREAKFAST 90 tablet 3   loratadine (CLARITIN) 10 MG tablet Take 10 mg by mouth at bedtime.      omeprazole (PRILOSEC) 20 MG capsule TAKE 1 CAPSULE BY MOUTH AT BEDTIME 90 capsule 3   vitamin B-12 (CYANOCOBALAMIN) 100 MCG tablet Take 100 mcg by mouth daily.     No current facility-administered medications for this visit.     Family History    Family History  Problem Relation Age of Onset   COPD Mother    Cancer Mother        unclear diagnosis   Transient ischemic attack Mother    Diabetes Sister    Hypertension Sister    Stroke Other    Cancer Paternal Grandmother        ?breast   Heart disease Neg Hx    She indicated that her mother is deceased. She  indicated that her father is deceased. She indicated that her sister is alive. She indicated that her brother is alive. She indicated that the status of her paternal grandmother is unknown. She indicated that the status of her neg hx is unknown. She indicated that her other is alive.  Social History    Social History   Socioeconomic History   Marital status: Divorced    Spouse name: Not on file   Number of children: 1   Years of education: Not on file   Highest education level: Not on file  Occupational History   Occupation: Retired historian--consultant  Tobacco Use   Smoking status: Never    Passive exposure: Past  Smokeless tobacco: Never  Vaping Use   Vaping Use: Never used  Substance and Sexual Activity   Alcohol use: Yes    Comment: rare wine   Drug use: No   Sexual activity: Not Currently  Other Topics Concern   Not on file  Social History Narrative   Divorced and retired , moved from [place], [place]   1 Daughter in [place]   Has living will   Daughter has health care POA.---[name]   Requests DNR--order done 06/19/12   Would not want prolonged mechanical ventilation   May accept tube feeds temporarily but not if cognitively unaware   Social Determinants of Health   Financial Resource Strain: Low Risk  (07/14/2021)   Overall Financial Resource Strain (CARDIA)    Difficulty of Paying Living Expenses: Not hard at all  Food Insecurity: No Food Insecurity (07/14/2021)   Hunger Vital Sign    Worried About Running Out of Food in the Last Year: Never true    Ran Out of Food in the Last Year: Never true  Transportation Needs: Not on file  Physical Activity: Not on file  Stress: Not on file  Social Connections: Not on file  Intimate Partner Violence: Not on file     Review of Systems    General:  No chills, fever, night sweats or weight changes.  Cardiovascular:  No chest pain, dyspnea on exertion, edema, orthopnea, palpitations, paroxysmal  nocturnal dyspnea. Dermatological: No rash, lesions/masses Respiratory: No cough, dyspnea Urologic: No hematuria, dysuria Abdominal:   No nausea, vomiting, diarrhea, bright red blood per rectum, melena, or hematemesis Neurologic:  No visual changes, wkns, changes in mental status. All other systems reviewed and are otherwise negative except as noted above.   Physical Exam    VS:  BP 110/60 (BP Location: Left Arm, Patient Position: Sitting, Cuff Size: Normal)   Pulse 83   Ht '5\' 8"'$  (1.727 m)   Wt 148 lb 8 oz (67.4 kg)   SpO2 98%   BMI 22.58 kg/m  , BMI Body mass index is 22.58 kg/m.     GEN: Well nourished, well developed, in no acute distress. HEENT: normal.  Glasses on Neck: Supple, no JVD, carotid bruits, or masses. Cardiac: RRR, no murmurs, rubs, or gallops. No clubbing, cyanosis, edema.  Radials/DP/PT 2+ and equal bilaterally.  Respiratory:  Respirations regular and unlabored, clear to auscultation bilaterally. GI: Soft, nontender, nondistended, BS + x 4. MS: no deformity or atrophy. Skin: warm and dry, no rash. Neuro:  Strength and sensation are intact. Psych: Normal affect.  Accessory Clinical Findings    ECG personally reviewed by me today-sinus rhythm rate 83- No acute changes  Lab Results  Component Value Date   WBC 6.0 02/11/2022   HGB 13.8 02/11/2022   HCT 42.0 02/11/2022   MCV 94.6 02/11/2022   PLT 256 02/11/2022   Lab Results  Component Value Date   CREATININE 0.93 02/11/2022   BUN 20 02/11/2022   NA 141 02/11/2022   K 3.8 02/11/2022   CL 114 (H) 02/11/2022   CO2 21 (L) 02/11/2022   Lab Results  Component Value Date   ALT 15 02/11/2022   AST 25 02/11/2022   ALKPHOS 56 02/11/2022   BILITOT 0.7 02/11/2022   Lab Results  Component Value Date   CHOL 172 02/04/2020   HDL 59.60 02/04/2020   LDLCALC 96 02/04/2020   TRIG 83.0 02/04/2020   CHOLHDL 3 02/04/2020    No results found for: "HGBA1C"  Assessment & Plan   1.  New onset paroxysmal  atrial fibrillation currently in sinus rhythm today.  Patient recent complaints of heart was taken to the emergency department was found to be in atrial fibrillation.  She was started on amiodarone and weaned to 200 mg.  TSH was checked today hospital level 237.  She was also started on apixaban for CHA2DS2-VASc score of at least 3.  She has been scheduled for an echocardiogram for her new onset A-fib.  2.  Nonobstructive CAD with moderate LAD disease.  Continues to deny chest pain.  She has been problems apixaban in lieu of aspirin.  High-sensitivity troponins peaked at 156 while she was in the hospital in atrial fibrillation RVR.  She has an echocardiogram ordered and pending.  Will need lipid panel return visit.  3.  History of GERD and has been continued on Prilosec.   4.  Disposition patient return to clinic to see MD/APP in 2 months or sooner if needed   Daveyon Kitchings, NP 02/23/2022, 12:33 PM

## 2022-03-09 ENCOUNTER — Telehealth: Payer: Self-pay

## 2022-03-09 NOTE — Progress Notes (Addendum)
Chronic Care Management Pharmacy Assistant   Name: ANALEAH BRAME  MRN: [id number] DOB: March 22, 1933  Reason for Encounter: CCM (General Adherence)  Recent office visits:  None since last CCM contact  Recent consult visits:  02/23/22 Gerrie Nordmann, NP (Cardiology) Coronary Artery Disease Order: Echo and EKG FU 2 months  Hospital visits:  None since last CCM contact  Medications: Outpatient Encounter Medications as of 03/09/2022  Medication Sig   albuterol (PROAIR HFA) 108 (90 Base) MCG/ACT inhaler INHALE 2 PUFFS INTO THE LUNGS 3 (THREE) TIMES DAILY AS NEEDED.   alum & mag hydroxide-simeth (MAALOX PLUS) 400-400-40 MG/5ML suspension Take 10 mLs by mouth as needed for indigestion.   amiodarone (PACERONE) 200 MG tablet Take 2 tablets (400 mg total) by mouth daily for 6 days, THEN 1 tablet (200 mg total) daily for 24 days. (Patient taking differently: Takes 1 tablet by mouth daily.)   apixaban (ELIQUIS) 5 MG TABS tablet Take 1 tablet (5 mg total) by mouth 2 (two) times daily.   carboxymethylcellulose (REFRESH PLUS) 0.5 % SOLN Place 2 drops into both eyes daily as needed (dry eyes).    diclofenac Sodium (VOLTAREN) 1 % GEL Apply 2 g topically 4 (four) times daily as needed.   levothyroxine (SYNTHROID) 75 MCG tablet TAKE 1 TABLET EVERY DAY ON EMPTY STOMACHWITH A GLASS OF WATER AT LEAST 30-60 MINBEFORE BREAKFAST   loratadine (CLARITIN) 10 MG tablet Take 10 mg by mouth at bedtime.    omeprazole (PRILOSEC) 20 MG capsule TAKE 1 CAPSULE BY MOUTH AT BEDTIME   vitamin B-12 (CYANOCOBALAMIN) 100 MCG tablet Take 100 mcg by mouth daily.   No facility-administered encounter medications on file as of 03/09/2022.    Contacted STEPHENI CAMERON on 03/14/2022 for general disease state and medication adherence call.   Star Medications: Medication Name/mg Last Fill Days Supply No star meds noted  What concerns do you have about your medications? None  Patient was started on Eliquis on 02/11/2022. How  is that going? Any side effects? Patient stated she has five pills remaining; it is going fine; no side effects or anything at this time.  Patient will need a refill of Eliquis and Amiodarone that she was prescribed in the hospital. If Dr. Silvio Pate feels patient needs to continue she would like refills sent to [facility].   The patient denies side effects with their medications.   How often do you forget or accidentally miss a dose? Never  Do you use a pillbox? No  Are you having any problems getting your medications from your pharmacy? No  Has the cost of your medications been a concern? No  Since last visit with CPP, no interventions have been made.   The patient has not had an ED visit since last contact.   The patient denies problems with their health.   Patient denies concerns or questions for Charlene Brooke, PharmD at this time.   Care Gaps: Annual wellness visit in last year? Yes 02/16/2022 Most Recent BP reading: 110/60 on 02/23/2022  Summary of recommendations from last [facility] visit (Date:07/14/2021)  Summary: CCM Initial visit -Pt started donepezil yesterday and had diarrhea, but today no issues. Discussed side effects of donepezil often improve with time -Pt reports ocular migraine, 1 episode over a year ago where she lost vision for several minutes, no repeat episode since. She wants to know if is safe to drive long distances, she has already contacted Neurology with this question   Recommendations/Changes made  from today's visit: -Advised pt to contact Neurology for alternative if donepezil side effect (diarrhea) does not improve -Advised to await Dr Trena Platt recommendations regarding driving long distances   Plan: -The patient has been provided with contact information for the care management team and has been advised to call with any health related questions or concerns.  -PCP [facility] 02/08/22  Upcoming appointments: Echocardiogram appointment on  03/22/2022 Cardiology appointment on 05/04/2022  Charlene Brooke, CPP notified  Marijean Niemann, [place] Assistant [phone]   Pharmacist addendum: Sent message to cardiology office for refills on amiodarone and Eliquis  Charlene Brooke, PharmD, [facility] 03/31/22 3:45 PM

## 2022-03-11 DIAGNOSIS — Z23 Encounter for immunization: Secondary | ICD-10-CM | POA: Diagnosis not present

## 2022-03-13 ENCOUNTER — Other Ambulatory Visit: Payer: Self-pay

## 2022-03-13 ENCOUNTER — Emergency Department
Admission: EM | Admit: 2022-03-13 | Discharge: 2022-03-13 | Disposition: A | Discharge status: Home / Self Care | Payer: Medicare Other | Attending: Emergency Medicine | Admitting: Emergency Medicine

## 2022-03-13 DIAGNOSIS — R0689 Other abnormalities of breathing: Secondary | ICD-10-CM | POA: Diagnosis not present

## 2022-03-13 DIAGNOSIS — N3000 Acute cystitis without hematuria: Secondary | ICD-10-CM | POA: Insufficient documentation

## 2022-03-13 DIAGNOSIS — R339 Retention of urine, unspecified: Secondary | ICD-10-CM | POA: Diagnosis present

## 2022-03-13 DIAGNOSIS — I959 Hypotension, unspecified: Secondary | ICD-10-CM | POA: Diagnosis not present

## 2022-03-13 LAB — URINALYSIS, COMPLETE (UACMP) WITH MICROSCOPIC
Bilirubin Urine: NEGATIVE
Glucose, UA: NEGATIVE mg/dL
Hgb urine dipstick: NEGATIVE
Ketones, ur: NEGATIVE mg/dL
Nitrite: NEGATIVE
Protein, ur: NEGATIVE mg/dL
Specific Gravity, Urine: 1.01 (ref 1.005–1.030)
pH: 6 (ref 5.0–8.0)

## 2022-03-13 MED ORDER — SULFAMETHOXAZOLE-TRIMETHOPRIM 800-160 MG PO TABS: 1.0000 | ORAL_TABLET | Freq: Two times a day (BID) | ORAL | 0 refills | Status: AC

## 2022-03-13 MED ORDER — SULFAMETHOXAZOLE-TRIMETHOPRIM 800-160 MG PO TABS
1.0000 | ORAL_TABLET | Freq: Once | ORAL | Status: AC
  Administered 2022-03-13: 1 via ORAL
  Filled 2022-03-13: qty 1

## 2022-03-13 NOTE — Discharge Instructions (Signed)
You are being treated for UTI.  Take the prescription antibiotic as directed for the next 3 days.  Follow-up with your primary provider for ongoing symptoms or return to the ED if needed.

## 2022-03-13 NOTE — ED Notes (Signed)
Patient provided urine sample. UA sent to lab at this time.

## 2022-03-13 NOTE — ED Notes (Signed)
Pt urinated without difficulty. Urine sent to lab.

## 2022-03-13 NOTE — ED Notes (Signed)
This RN called [facility]. They will be sending someone to pick up the pt.

## 2022-03-13 NOTE — ED Triage Notes (Signed)
Pt to ED via [facility] from [facility]. Pt complaining of inability to urinate since lunch, usually is able to go every hour and a half. Per pt was here for afib a couple weeks ago. Denies any abdominal pain, states there is some irritation when needing to urinate. States "I have the feeling that I need to pee, but when I try nothing comes out."

## 2022-03-13 NOTE — ED Provider Notes (Signed)
[facility] Emergency Department Provider Note     Event Date/Time   First MD Initiated Contact with Patient 03/13/22 1734     (approximate)   History   Urinary Retention and Dysuria   HPI  Ariel Phillips is a 88 y.o. female presents to the ED via EMS from her facility at [facility].  Patient reports she last spontaneously voided at lunchtime today.  She reports regular bladder habits, noted that she goes approximately every hour and a half.  Patient denies any chest pain, abdominal pain or pelvic pain she also denies any flank pain.  She does note some irritation and urinary urgency and frequency.  Patient with initial complaints of urinary retention, spontaneously voided prior to my evaluation.   Physical Exam   Triage Vital Signs: ED Triage Vitals  Enc Vitals Group     BP 03/13/22 1711 139/79     Pulse Rate 03/13/22 1711 88     Resp 03/13/22 1711 16     Temp 03/13/22 1711 97.8 F (36.6 C)     Temp src --      SpO2 03/13/22 1711 96 %     Weight 03/13/22 1712 150 lb (68 kg)     Height 03/13/22 1712 '5\' 5"'$  (1.651 m)     Head Circumference --      Peak Flow --      Pain Score 03/13/22 1712 0     Pain Loc --      Pain Edu? --      Excl. in [place]? --     Most recent vital signs: Vitals:   03/13/22 1711  BP: 139/79  Pulse: 88  Resp: 16  Temp: 97.8 F (36.6 C)  SpO2: 96%    General Awake, no distress. NAD CV:  Good peripheral perfusion.  RESP:  Normal effort.  ABD:  No distention. No flank pain   ED Results / Procedures / Treatments   Labs (all labs ordered are listed, but only abnormal results are displayed) Labs Reviewed  URINALYSIS, COMPLETE (UACMP) WITH MICROSCOPIC - Abnormal; Notable for the following components:      Result Value   Color, Urine YELLOW (*)    APPearance HAZY (*)    Leukocytes,Ua MODERATE (*)    Bacteria, UA RARE (*)    All other components within normal limits  URINE CULTURE      EKG   RADIOLOGY  No results found.   PROCEDURES:  Critical Care performed: No  Procedures   MEDICATIONS ORDERED IN ED: Medications  sulfamethoxazole-trimethoprim (BACTRIM DS) 800-160 MG per tablet 1 tablet (1 tablet Oral Given 03/13/22 1841)     IMPRESSION / MDM / ASSESSMENT AND PLAN / ED COURSE  I reviewed the triage vital signs and the nursing notes.                              Differential diagnosis includes, but is not limited to, urinary retention, UTI, nephrolithiasis  Patient's presentation is most consistent with acute complicated illness / injury requiring diagnostic workup.  Patient's diagnosis is consistent with acute urinary retention due to UTI. She resolved her retention spontaneously in the ED. Patient will be discharged home with prescriptions for Bactrim DS. Patient is to follow up with her PCP as needed or otherwise directed. Patient is given ED precautions to return to the ED for any worsening or new symptoms.  FINAL CLINICAL IMPRESSION(S) /  ED DIAGNOSES   Final diagnoses:  Acute cystitis without hematuria     Rx / DC Orders   ED Discharge Orders          Ordered    sulfamethoxazole-trimethoprim (BACTRIM DS) 800-160 MG tablet  2 times daily        03/13/22 1829             Note:  This document was prepared using Dragon voice recognition software and may include unintentional dictation errors.    Melvenia Needles, PA-C 03/13/22 2322    Duffy Bruce, MD 03/15/22 [phone]

## 2022-03-14 ENCOUNTER — Telehealth: Payer: Self-pay

## 2022-03-14 LAB — URINE CULTURE: Culture: <10000 — AB

## 2022-03-14 NOTE — Telephone Encounter (Signed)
Spoke to pt to see how she was doing after ER visit for urinary retention. She said she is doing better today. Will call if she has any other issues.

## 2022-03-15 ENCOUNTER — Encounter: Payer: Self-pay | Admitting: Internal Medicine

## 2022-03-15 NOTE — Telephone Encounter (Signed)
Do you want [place] to call and set up follow up with patient?

## 2022-03-16 ENCOUNTER — Telehealth: Payer: Self-pay | Admitting: *Deleted

## 2022-03-16 NOTE — Patient Outreach (Signed)
  Care Coordination TOC Note Transition Care Management Follow-up Telephone Call Date of discharge and from where: [facility] 03/14/2022 How have you been since you were released from the hospital? I am doing much better no burning or symptoms Any questions or concerns? No  Items Reviewed: Did the pt receive and understand the discharge instructions provided? Yes  Medications obtained and verified? Yes  Other? No  Any new allergies since your discharge? No  Dietary orders reviewed? No Do you have support at home? Yes   Home Care and Equipment/Supplies: Were home health services ordered? No If so, what is the name of the agency? N/a  Has the agency set up a time to come to the patient's home? no Were any new equipment or medical supplies ordered?  No What is the name of the medical supply agency? N/a Were you able to get the supplies/equipment? no Do you have any questions related to the use of the equipment or supplies? No  Functional Questionnaire: (I = Independent and D = Dependent) ADLs: I  Bathing/Dressing- I  Meal Prep- I  Eating- I  Maintaining continence- I  Transferring/Ambulation- I  Managing Meds- I  Follow up appointments reviewed:  PCP Hospital f/u appt confirmed? Yes  Follow up with Dr Silvio Pate as needed. Patient spoke with office on 03/14/2022 at 3:57 [facility] f/u appt confirmed? No   Are transportation arrangements needed? No  If their condition worsens, is the pt aware to call PCP or go to the Emergency Dept.? Yes Was the patient provided with contact information for the PCP's office or ED? Yes Was to pt encouraged to call back with questions or concerns? Yes  SDOH assessments and interventions completed:   Yes  Care Coordination Interventions Activated:  Yes   Care Coordination Interventions:  No Care Coordination interventions needed at this time.   Encounter Outcome:  Pt. Visit Completed    [place]  Management [phone]

## 2022-03-17 ENCOUNTER — Telehealth: Payer: Self-pay

## 2022-03-17 NOTE — Telephone Encounter (Signed)
        Patient  visited [place] 11/12  Telephone encounter attempt :  1st  A HIPAA compliant voice message was left requesting a return call.  Instructed patient to call back   [place], [place] Management  [phone], [place], [place] [address] Phone: [phone] Email: [email or web address]

## 2022-03-22 ENCOUNTER — Ambulatory Visit: Payer: Medicare Other | Attending: Cardiology

## 2022-03-22 DIAGNOSIS — I4891 Unspecified atrial fibrillation: Secondary | ICD-10-CM | POA: Diagnosis not present

## 2022-03-22 LAB — ECHOCARDIOGRAM COMPLETE
AR max vel: 2.96 cm2
AV Area VTI: 3.8 cm2
AV Area mean vel: 3.39 cm2
AV Mean grad: 3 mmHg
AV Peak grad: 4.9 mmHg
AV Vena cont: 0.3 cm
Ao pk vel: 1.11 m/s
Area-P 1/2: 3.34 cm2
Calc EF: 70 %
P 1/2 time: 469 msec
S' Lateral: 2.1 cm
Single Plane A2C EF: 68.6 %
Single Plane A4C EF: 72.8 %

## 2022-03-31 ENCOUNTER — Telehealth: Payer: Self-pay | Admitting: Pharmacist

## 2022-03-31 DIAGNOSIS — I480 Paroxysmal atrial fibrillation: Secondary | ICD-10-CM

## 2022-03-31 MED ORDER — AMIODARONE HCL 200 MG PO TABS: ORAL_TABLET | ORAL | 2 refills | Status: DC

## 2022-03-31 MED ORDER — APIXABAN 5 MG PO TABS: 5.0000 mg | ORAL_TABLET | Freq: Two times a day (BID) | ORAL | 1 refills | Status: DC

## 2022-03-31 NOTE — Telephone Encounter (Signed)
Please review for refill on Eliquis.  Amiodarone sent to pharmacy.

## 2022-03-31 NOTE — Telephone Encounter (Signed)
Patient was started on amiodarone and Eliquis in ED 02/11/22 for new onset Afib, both Rx for 30 day supplies with no refills. She had cardiology appt 02/23/22. I believe the plan was to continue these medication indefinitely. She will need refills sent to [facility].   Routing to cardiology for refills.

## 2022-03-31 NOTE — Telephone Encounter (Signed)
Prescription refill request for Eliquis received. Indication: Afib  Last office visit: 02/23/22 ([place])  Scr: 0.93 (02/11/22)  Age: 88 Weight: 68kg  Appropriate dose and refill sent to requested pharmacy.

## 2022-03-31 NOTE — Addendum Note (Signed)
Addended by: Leonidas Romberg on: 03/31/2022 04:22 PM   Modules accepted: Orders

## 2022-04-07 ENCOUNTER — Encounter: Payer: Self-pay | Admitting: Internal Medicine

## 2022-04-10 NOTE — Progress Notes (Unsigned)
    Ariel [place] T. Bhavya Eschete, MD, [place] at [facility] [place], [address]  Phone: [phone]  FAX: [phone]  Ariel Phillips - 88 y.o. female  MRN [id number]  Date of Birth: 04-20-1933  Date: 04/11/2022  PCP: Venia Carbon, MD  Referral: Venia Carbon, MD  No chief complaint on file.  Subjective:   Ariel Phillips is a 88 y.o. very pleasant female patient with There is no height or weight on file to calculate BMI. who presents with the following:  The patient presents with L foot pain:     Review of Systems is noted in the HPI, as appropriate  Objective:   There were no vitals taken for this visit.  GEN: No acute distress; alert,appropriate. PULM: Breathing comfortably in no respiratory distress PSYCH: Normally interactive.   Laboratory and Imaging Data:  Assessment and Plan:   ***

## 2022-04-11 ENCOUNTER — Telehealth: Payer: Self-pay | Admitting: Cardiology

## 2022-04-11 ENCOUNTER — Encounter: Payer: Self-pay | Admitting: Family Medicine

## 2022-04-11 ENCOUNTER — Ambulatory Visit (INDEPENDENT_AMBULATORY_CARE_PROVIDER_SITE_OTHER): Payer: Medicare Other | Admitting: Family Medicine

## 2022-04-11 VITALS — BP 110/72 | HR 77 | Temp 97.7°F | Ht 68.0 in | Wt 149.5 lb

## 2022-04-11 DIAGNOSIS — I2510 Atherosclerotic heart disease of native coronary artery without angina pectoris: Secondary | ICD-10-CM

## 2022-04-11 DIAGNOSIS — M21622 Bunionette of left foot: Secondary | ICD-10-CM | POA: Diagnosis not present

## 2022-04-11 NOTE — Telephone Encounter (Signed)
Left message to call back  

## 2022-04-11 NOTE — Telephone Encounter (Signed)
Patient last seen by Gerrie Nordmann, NP on 02/23/22.  Per her medication list at that visit, she was on Eliquis 5 mg BID & Amiodarone 200 mg QD.  I do not see that she was told to stop these medications by our practice for her new onset atrial fibrillation.    These medications are no longer showing up on her medication list.  She was seen by her PCP today and they were discontinued off her medication list by them today.   To Gerrie Nordmann, NP to advise is there is any reason she should be off either of these medications at this time.   The patient is scheduled to follow up on 05/04/22 with Dr. Garen Lah.

## 2022-04-11 NOTE — Telephone Encounter (Signed)
When she was in the office last she was new onset atrial fibrillation and was continued on amiodarone and apixaban and should continue on them for stroke prevention.

## 2022-04-11 NOTE — Telephone Encounter (Signed)
Pt c/o medication issue:  1. Name of Medication: Eliquis and amiodarone  2. How are you currently taking this medication (dosage and times per day)?   3. Are you having a reaction (difficulty breathing--STAT)?   4. What is your medication issue? Patient called stating she got a call from [facility] stating that these medications are reading for pick up.  She states she did have a script for them for 30 days but that was back in October.  She used them up and there were no refills, she is wondering why now she has a new prescription for them now.

## 2022-04-13 ENCOUNTER — Encounter: Payer: Self-pay | Admitting: Cardiology

## 2022-04-16 NOTE — Progress Notes (Deleted)
Cam walker

## 2022-04-18 ENCOUNTER — Encounter: Payer: Self-pay | Admitting: Emergency Medicine

## 2022-04-18 ENCOUNTER — Other Ambulatory Visit: Payer: Self-pay

## 2022-04-18 ENCOUNTER — Ambulatory Visit: Payer: Medicare Other | Admitting: Family Medicine

## 2022-04-18 ENCOUNTER — Emergency Department
Admission: EM | Admit: 2022-04-18 | Discharge: 2022-04-18 | Disposition: A | Discharge status: Home / Self Care | Payer: Medicare Other | Attending: Emergency Medicine | Admitting: Emergency Medicine

## 2022-04-18 ENCOUNTER — Emergency Department: Payer: Medicare Other

## 2022-04-18 DIAGNOSIS — I2510 Atherosclerotic heart disease of native coronary artery without angina pectoris: Secondary | ICD-10-CM | POA: Diagnosis not present

## 2022-04-18 DIAGNOSIS — R1013 Epigastric pain: Secondary | ICD-10-CM | POA: Insufficient documentation

## 2022-04-18 DIAGNOSIS — R1010 Upper abdominal pain, unspecified: Secondary | ICD-10-CM | POA: Diagnosis present

## 2022-04-18 DIAGNOSIS — R1310 Dysphagia, unspecified: Secondary | ICD-10-CM | POA: Diagnosis not present

## 2022-04-18 DIAGNOSIS — R079 Chest pain, unspecified: Secondary | ICD-10-CM | POA: Diagnosis not present

## 2022-04-18 LAB — COMPREHENSIVE METABOLIC PANEL
ALT: 15 U/L (ref 0–44)
AST: 22 U/L (ref 15–41)
Albumin: 3.9 g/dL (ref 3.5–5.0)
Alkaline Phosphatase: 64 U/L (ref 38–126)
Anion gap: 8 (ref 5–15)
BUN: 16 mg/dL (ref 8–23)
CO2: 23 mmol/L (ref 22–32)
Calcium: 8.7 mg/dL — ABNORMAL LOW (ref 8.9–10.3)
Chloride: 107 mmol/L (ref 98–111)
Creatinine, Ser: 0.72 mg/dL (ref 0.44–1.00)
GFR, Estimated: >60 mL/min (ref >60)
Glucose, Bld: 99 mg/dL (ref 70–99)
Potassium: 3.7 mmol/L (ref 3.5–5.1)
Sodium: 138 mmol/L (ref 135–145)
Total Bilirubin: 1.2 mg/dL (ref 0.3–1.2)
Total Protein: 7.1 g/dL (ref 6.5–8.1)

## 2022-04-18 LAB — CBC
HCT: 45.6 % (ref 36.0–46.0)
Hemoglobin: 14.8 g/dL (ref 12.0–15.0)
MCH: 30.8 pg (ref 26.0–34.0)
MCHC: 32.5 g/dL (ref 30.0–36.0)
MCV: 95 fL (ref 80.0–100.0)
Platelets: 268 10*3/uL (ref 150–400)
RBC: 4.8 MIL/uL (ref 3.87–5.11)
RDW: 13.2 % (ref 11.5–15.5)
WBC: 5.5 10*3/uL (ref 4.0–10.5)
nRBC: 0 % (ref 0.0–0.2)

## 2022-04-18 LAB — TROPONIN I (HIGH SENSITIVITY): Troponin I (High Sensitivity): 5 ng/L (ref <18)

## 2022-04-18 MED ORDER — APIXABAN 5 MG PO TABS: 5.0000 mg | ORAL_TABLET | Freq: Two times a day (BID) | ORAL | Status: DC

## 2022-04-18 MED ORDER — AMIODARONE HCL 200 MG PO TABS: 200.0000 mg | ORAL_TABLET | Freq: Every day | ORAL | Status: DC

## 2022-04-18 NOTE — ED Triage Notes (Signed)
First nurse note: Pt here via [facility] with c/o of CP and states she woke up fine and took her pills then started having CP.   128/64 96% RA HR: 72

## 2022-04-18 NOTE — ED Provider Notes (Signed)
[facility] Provider Note    Event Date/Time   First MD Initiated Contact with Patient 04/18/22 1030     (approximate)   History   Chief Complaint: Chest Pain   HPI  Ariel Phillips is a 88 y.o. female with a history of CAD, GERD, IBS who comes the ED complaining of squeezing bandlike discomfort of the upper abdomen that started at 8:00 AM after taking her medications on an empty stomach.  Denies chest pain or shortness of breath.  Nonradiating.  No aggravating or alleviating factors.  Not exertional, not pleuritic.  No diaphoresis vomiting palpitations or dizziness.  No syncope.  No diarrhea.  Prior to this she was in her usual state of health.  Symptoms lasted about 5 minutes and then resolved, she is currently asymptomatic.     Physical Exam   Triage Vital Signs: ED Triage Vitals  Enc Vitals Group     BP 04/18/22 0905 112/67     Pulse Rate 04/18/22 0905 88     Resp 04/18/22 0905 18     Temp 04/18/22 0905 98.8 F (37.1 C)     Temp Source 04/18/22 0905 Oral     SpO2 04/18/22 0905 93 %     Weight 04/18/22 0907 150 lb (68 kg)     Height 04/18/22 0907 '5\' 6"'$  (1.676 m)     Head Circumference --      Peak Flow --      Pain Score 04/18/22 0907 0     Pain Loc --      Pain Edu? --      Excl. in [place]? --     Most recent vital signs: Vitals:   04/18/22 0905  BP: 112/67  Pulse: 88  Resp: 18  Temp: 98.8 F (37.1 C)  SpO2: 93%    General: Awake, no distress.  CV:  Good peripheral perfusion.  Normal distal pulses.  Regular rate and rhythm. Resp:  Normal effort.  Clear to auscultation bilaterally Abd:  No distention.  Soft with mild left upper quadrant tenderness Other:  No lower extremity edema.   ED Results / Procedures / Treatments   Labs (all labs ordered are listed, but only abnormal results are displayed) Labs Reviewed  COMPREHENSIVE METABOLIC PANEL - Abnormal; Notable for the following components:      Result Value   Calcium 8.7 (*)     All other components within normal limits  CBC  TROPONIN I (HIGH SENSITIVITY)     EKG Interpreted by me Normal sinus rhythm rate of 84.  Normal axis intervals QRS ST segments and T waves   RADIOLOGY Chest x-ray interpreted by me, appears normal.  Radiology report reviewed.   PROCEDURES:  Procedures   MEDICATIONS ORDERED IN ED: Medications - No data to display   IMPRESSION / MDM / [place] / ED COURSE  I reviewed the triage vital signs and the nursing notes.                              Differential diagnosis includes, but is not limited to, GERD/gastritis, anxiety, esophageal dysmotility, dehydration, electrolyte abnormality, pleural effusion  Patient's presentation is most consistent with acute presentation with potential threat to life or bodily function.  Patient presents with upper abdominal pain after taking her medication this morning.  Has not eaten.  Vital signs are normal, exam is benign and reassuring.  Doubt ACS PE dissection  pericardial effusion.  EKG, chest x-ray, and labs are all unremarkable.  Cardiac workup not indicated.  Stable for discharge and outpatient follow-up with PCP.       FINAL CLINICAL IMPRESSION(S) / ED DIAGNOSES   Final diagnoses:  Epigastric pain     Rx / DC Orders   ED Discharge Orders     None        Note:  This document was prepared using Dragon voice recognition software and may include unintentional dictation errors.   Carrie Mew, MD 04/18/22 1054

## 2022-04-18 NOTE — ED Triage Notes (Signed)
Pt in with co chest pain that started this am hx of afib. Denies any recent illness or cold symptoms.

## 2022-04-18 NOTE — Telephone Encounter (Signed)
Responded to the patient through a MyChart message last week regarding amiodarone and eliquis.  See 04/13/22 patient advise request.

## 2022-04-19 NOTE — Progress Notes (Signed)
[name], MD, [employer] at [facility] [place], [address]  Phone: [phone]  FAX: [phone]  Ariel Phillips - 88 y.o. female  MRN [id number]  Date of Birth: 04-03-1933  Date: 04/20/2022  PCP: Venia Carbon, MD  Referral: Venia Carbon, MD  Chief Complaint  Patient presents with   Follow-up    Left Foot Pain   Subjective:   Ariel Phillips is a 88 y.o. very pleasant female patient with Body mass index is 22.66 kg/m. who presents with the following:  88 year old patient presents with left-sided ankle pain.  I actually saw her a few days ago on April 11, 2022, with some forefoot pain.  At that point she seemed to have a modestly tender bunionette.  She presents today in follow-up.  At one point it was agonizingly painful.  This was only for a brief time period, and right now it is completely asymptomatic.  2 weeks of pain. Forefoot - more at the 5th MT, to a lesser extent at the 3rd and 4th MTP.   Small bone chip at 5th MTP  Review of Systems is noted in the HPI, as appropriate  Objective:   BP 130/80   Pulse 76   Temp 97.7 F (36.5 C) (Oral)   Ht '5\' 8"'$  (1.727 m)   Wt 149 lb (67.6 kg)   SpO2 98%   BMI 22.66 kg/m   GEN: No acute distress; alert,appropriate. PULM: Breathing comfortably in no respiratory distress PSYCH: Normally interactive.   There is mild tenderness at the fifth MTP joint and to a lesser extent at the third and fourth MTP joints.  There is no tenderness along the metatarsal shafts or in the phalanges themselves.  The midfoot, hindfoot, and ankle is entirely nontender.  Laboratory and Imaging Data:  Assessment and Plan:     ICD-10-CM   1. Capsulitis of toe of left foot  M77.52     2. Acute foot pain, left  M79.672 DG Foot Complete Left    3. Metatarsalgia of left foot  M77.42      Transient forefoot pain the right now is essentially minimally to  nontender.  This is not causing her any pain at baseline.  Overall the clinical picture is very reassuring.  There is a radiopacity adjacent to the fifth MTP joint on the plain film.  This is of questionable significance to her initial period of jolting pain.  Nevertheless, I think that this can be followed conservatively.  Orders placed today for conditions managed today: Orders Placed This Encounter  Procedures   DG Foot Complete Left    Disposition: No follow-ups on file.  Dragon [employer] One speech-to-text software was used for transcription in this dictation.  Possible transcriptional errors can occur using Editor, commissioning.   Signed,  Maud Deed. Judd Mccubbin, MD   Outpatient Encounter Medications as of 04/20/2022  Medication Sig   albuterol (PROAIR HFA) 108 (90 Base) MCG/ACT inhaler INHALE 2 PUFFS INTO THE LUNGS 3 (THREE) TIMES DAILY AS NEEDED.   alum & mag hydroxide-simeth (MAALOX PLUS) 400-400-40 MG/5ML suspension Take 10 mLs by mouth as needed for indigestion.   amiodarone (PACERONE) 200 MG tablet Take 1 tablet (200 mg total) by mouth daily.   apixaban (ELIQUIS) 5 MG TABS tablet Take 1 tablet (5 mg total) by mouth 2 (two) times daily.   carboxymethylcellulose (REFRESH PLUS) 0.5 % SOLN Place 2 drops into both eyes daily  as needed (dry eyes).    cetirizine (ZYRTEC) 10 MG tablet Take 10 mg by mouth daily.   diclofenac Sodium (VOLTAREN) 1 % GEL Apply 2 g topically 4 (four) times daily as needed.   levothyroxine (SYNTHROID) 75 MCG tablet TAKE 1 TABLET EVERY DAY ON EMPTY STOMACHWITH A GLASS OF WATER AT LEAST 30-60 MINBEFORE BREAKFAST   loratadine (CLARITIN) 10 MG tablet Take 10 mg by mouth at bedtime.    omeprazole (PRILOSEC) 20 MG capsule TAKE 1 CAPSULE BY MOUTH AT BEDTIME   No facility-administered encounter medications on file as of 04/20/2022.

## 2022-04-20 ENCOUNTER — Encounter: Payer: Self-pay | Admitting: Family Medicine

## 2022-04-20 ENCOUNTER — Ambulatory Visit (INDEPENDENT_AMBULATORY_CARE_PROVIDER_SITE_OTHER)
Admission: RE | Admit: 2022-04-20 | Discharge: 2022-04-20 | Disposition: A | Discharge status: Home / Self Care | Payer: Medicare Other | Source: Ambulatory Visit | Attending: Family Medicine | Admitting: Family Medicine

## 2022-04-20 ENCOUNTER — Ambulatory Visit (INDEPENDENT_AMBULATORY_CARE_PROVIDER_SITE_OTHER): Payer: Medicare Other | Admitting: Family Medicine

## 2022-04-20 VITALS — BP 130/80 | HR 76 | Temp 97.7°F | Ht 68.0 in | Wt 149.0 lb

## 2022-04-20 DIAGNOSIS — M79672 Pain in left foot: Secondary | ICD-10-CM | POA: Diagnosis not present

## 2022-04-20 DIAGNOSIS — M7752 Other enthesopathy of left foot: Secondary | ICD-10-CM | POA: Diagnosis not present

## 2022-04-20 DIAGNOSIS — M7742 Metatarsalgia, left foot: Secondary | ICD-10-CM

## 2022-04-20 DIAGNOSIS — I2510 Atherosclerotic heart disease of native coronary artery without angina pectoris: Secondary | ICD-10-CM | POA: Diagnosis not present

## 2022-04-20 DIAGNOSIS — M19072 Primary osteoarthritis, left ankle and foot: Secondary | ICD-10-CM | POA: Diagnosis not present

## 2022-04-26 ENCOUNTER — Telehealth: Payer: Self-pay | Admitting: *Deleted

## 2022-04-26 NOTE — Telephone Encounter (Signed)
        Patient  visited [facility] on 12/182023  for pain   Telephone encounter attempt :  1st   No answer

## 2022-04-29 ENCOUNTER — Encounter: Payer: Self-pay | Admitting: Internal Medicine

## 2022-04-29 DIAGNOSIS — M79672 Pain in left foot: Secondary | ICD-10-CM

## 2022-04-29 NOTE — Telephone Encounter (Signed)
Ariel Phillips, She is wondering about PT---has ongoing pain. Do you think that might help (is at [facility]?)

## 2022-05-04 ENCOUNTER — Ambulatory Visit: Payer: Medicare Other | Admitting: Cardiology

## 2022-05-06 DIAGNOSIS — D2262 Melanocytic nevi of left upper limb, including shoulder: Secondary | ICD-10-CM | POA: Diagnosis not present

## 2022-05-06 DIAGNOSIS — D692 Other nonthrombocytopenic purpura: Secondary | ICD-10-CM | POA: Diagnosis not present

## 2022-05-06 DIAGNOSIS — D225 Melanocytic nevi of trunk: Secondary | ICD-10-CM | POA: Diagnosis not present

## 2022-05-06 DIAGNOSIS — Z85828 Personal history of other malignant neoplasm of skin: Secondary | ICD-10-CM | POA: Diagnosis not present

## 2022-05-06 DIAGNOSIS — D2261 Melanocytic nevi of right upper limb, including shoulder: Secondary | ICD-10-CM | POA: Diagnosis not present

## 2022-05-12 DIAGNOSIS — R2681 Unsteadiness on feet: Secondary | ICD-10-CM | POA: Diagnosis not present

## 2022-05-12 DIAGNOSIS — G3184 Mild cognitive impairment, so stated: Secondary | ICD-10-CM | POA: Diagnosis not present

## 2022-05-19 DIAGNOSIS — R2681 Unsteadiness on feet: Secondary | ICD-10-CM | POA: Diagnosis not present

## 2022-05-19 DIAGNOSIS — G3184 Mild cognitive impairment, so stated: Secondary | ICD-10-CM | POA: Diagnosis not present

## 2022-05-27 ENCOUNTER — Encounter: Payer: Self-pay | Admitting: Cardiology

## 2022-05-27 ENCOUNTER — Ambulatory Visit: Payer: Medicare Other | Attending: Cardiology | Admitting: Cardiology

## 2022-05-27 ENCOUNTER — Telehealth: Payer: Self-pay | Admitting: Internal Medicine

## 2022-05-27 VITALS — BP 120/68 | HR 85 | Ht 64.0 in | Wt 150.8 lb

## 2022-05-27 DIAGNOSIS — I2510 Atherosclerotic heart disease of native coronary artery without angina pectoris: Secondary | ICD-10-CM | POA: Diagnosis not present

## 2022-05-27 DIAGNOSIS — I480 Paroxysmal atrial fibrillation: Secondary | ICD-10-CM | POA: Insufficient documentation

## 2022-05-27 NOTE — Telephone Encounter (Signed)
Estill Bamberg from [facility] called and stated she doesn't foot pain but she is having hip and knee pain and wanted to see if they can get an order for those symptoms. Call back number [phone]. Fax number [phone]

## 2022-05-27 NOTE — Progress Notes (Signed)
Cardiology Office Note:    Date:  05/27/2022   ID:  Ariel Phillips, DOB 08-18-1932, MRN [id number]  PCP:  Venia Carbon, MD  Cardiologist:  Kate Sable, MD  Electrophysiologist:  None   Referring MD: Venia Carbon, MD   Chief Complaint  Patient presents with   Follow-up    2 month f/u, medication questions     History of Present Illness:    Ariel Phillips is a 89 y.o. female with a hx of paroxysmal atrial fibrillation, non obstructive CAD (mod LAD- CCTA 3/21 ), who presents for follow-up.    Presented to the ED 02/21/2022 with symptoms of palpitations, EKG showed A-fib with RVR.  Started on amiodarone with conversion to sinus rhythm.  Amiodarone taper to 200 mg daily.  Eliquis 5 mg twice daily started for stroke prophylaxis.  Denies any bleeding issues with Eliquis.   Prior notes Echocardiogram 03/2022 EF 60 to 65% normal LA size Echo 07/2019 showed normal systolic function, impaired relaxation EF 60 to 65%. Coronary CTA 3/21 showed a calcium score of 200, moderate LAD disease, FFR CT did not show any significant stenosis in the LAD.  FFR CT showed stenosis in diagonal 1, small vessel.   Past Medical History:  Diagnosis Date   Allergic asthma    Allergic rhinitis due to pollen    Allergy    GERD (gastroesophageal reflux disease)    IBS (irritable bowel syndrome)    Osteopenia    Unspecified hypothyroidism     Past Surgical History:  Procedure Laterality Date   ANAL FISSURE REPAIR  1950's   BASAL CELL CARCINOMA EXCISION  2000's   nose   CHOLECYSTECTOMY  2008   COLONOSCOPY  Multiple   Negative screening exams   ENDOSCOPIC RETROGRADE CHOLANGIOPANCREATOGRAPHY (ERCP) WITH PROPOFOL N/A 02/13/2019   Procedure: ENDOSCOPIC RETROGRADE CHOLANGIOPANCREATOGRAPHY (ERCP) WITH PROPOFOL;  Surgeon: Gatha Mayer, MD;  Location: [facility];  Service: Endoscopy;  Laterality: N/A;   [place] or so   with fissure repair   ORIF ANKLE FRACTURE Right  06/27/2020   Procedure: OPEN REDUCTION INTERNAL FIXATION (ORIF) ANKLE FRACTURE;  Surgeon: Caroline More, DPM;  Location: [facility] ORS;  Service: Podiatry;  Laterality: Right;   REMOVAL OF STONES  02/13/2019   Procedure: REMOVAL OF STONES;  Surgeon: Gatha Mayer, MD;  Location: [facility];  Service: Endoscopy;;   SPHINCTEROTOMY  02/13/2019   Procedure: [name];  Surgeon: Gatha Mayer, MD;  Location: [facility];  Service: Endoscopy;;   TUBAL LIGATION      Current Medications: Current Meds  Medication Sig   amiodarone (PACERONE) 200 MG tablet Take 1 tablet (200 mg total) by mouth daily.   apixaban (ELIQUIS) 5 MG TABS tablet Take 1 tablet (5 mg total) by mouth 2 (two) times daily.   carboxymethylcellulose (REFRESH PLUS) 0.5 % SOLN Place 2 drops into both eyes daily as needed (dry eyes).    diclofenac Sodium (VOLTAREN) 1 % GEL Apply 2 g topically 4 (four) times daily as needed.   levothyroxine (SYNTHROID) 75 MCG tablet TAKE 1 TABLET EVERY DAY ON EMPTY STOMACHWITH A GLASS OF WATER AT LEAST 30-60 MINBEFORE BREAKFAST   loratadine (CLARITIN) 10 MG tablet Take 10 mg by mouth at bedtime.      Allergies:   Grass pollen([id number]-o-r-t-swt vern), Penicillins, and Latex   Social History   Socioeconomic History   Marital status: Divorced    Spouse name: Not on file   Number of children: 1  Years of education: Not on file   Highest education level: Not on file  Occupational History   Occupation: Retired historian--consultant  Tobacco Use   Smoking status: Never    Passive exposure: Past   Smokeless tobacco: Never  Vaping Use   Vaping Use: Never used  Substance and Sexual Activity   Alcohol use: Yes    Comment: rare wine   Drug use: No   Sexual activity: Not Currently  Other Topics Concern   Not on file  Social History Narrative   Divorced and retired , moved from [place], [place]   1 Daughter in [place]   Has living will   Daughter has health care POA.---[name]    Requests DNR--order done 06/19/12   Would not want prolonged mechanical ventilation   May accept tube feeds temporarily but not if cognitively unaware   Social Determinants of Health   Financial Resource Strain: Low Risk  (07/14/2021)   Overall Financial Resource Strain (CARDIA)    Difficulty of Paying Living Expenses: Not hard at all  Food Insecurity: No Food Insecurity (03/16/2022)   Hunger Vital Sign    Worried About Running Out of Food in the Last Year: Never true    [place] in the Last Year: Never true  Transportation Needs: No Transportation Needs (03/16/2022)   PRAPARE - Hydrologist (Medical): No    Lack of Transportation (Non-Medical): No  Physical Activity: Not on file  Stress: Not on file  Social Connections: Not on file     Family History: The patient's family history includes COPD in her mother; Cancer in her mother and paternal grandmother; Diabetes in her sister; Hypertension in her sister; Stroke in an other family member; Transient ischemic attack in her mother. There is no history of Heart disease.  ROS:   Please see the history of present illness.     All other systems reviewed and are negative.  EKGs/Labs/Other Studies Reviewed:    The following studies were reviewed today:   EKG:  EKG is  ordered today.  The ekg ordered today demonstrates normal sinus rhythm, normal ECG.  Recent Labs: 02/11/2022: B Natriuretic Peptide 125.2; Magnesium 2.1; TSH 4.237 04/18/2022: ALT 15; BUN 16; Creatinine, Ser 0.72; Hemoglobin 14.8; Platelets 268; Potassium 3.7; Sodium 138  Recent Lipid Panel    Component Value Date/Time   CHOL 172 02/04/2020 1214   TRIG 83.0 02/04/2020 1214   HDL 59.60 02/04/2020 1214   CHOLHDL 3 02/04/2020 1214   VLDL 16.6 02/04/2020 1214   LDLCALC 96 02/04/2020 1214    Physical Exam:    VS:  BP 120/68 (BP Location: Left Arm, Patient Position: Sitting, Cuff Size: Normal)   Pulse 85   Ht '5\' 4"'$  (1.626 m)    Wt 150 lb 12.8 oz (68.4 kg)   SpO2 96%   BMI 25.88 kg/m     Wt Readings from Last 3 Encounters:  05/27/22 150 lb 12.8 oz (68.4 kg)  04/20/22 149 lb (67.6 kg)  04/18/22 150 lb (68 kg)     GEN:  Well nourished, well developed in no acute distress HEENT: Normal NECK: No JVD; No carotid bruits CARDIAC: RRR, no murmurs, rubs, gallops RESPIRATORY:  Clear to auscultation without rales, wheezing or rhonchi  ABDOMEN: Soft, non-tender, non-distended MUSCULOSKELETAL:  No edema; right ankle in dressing/scars noted SKIN: Warm and dry NEUROLOGIC:  Alert and oriented x 3 PSYCHIATRIC:  Normal affect   ASSESSMENT:    1.  Paroxysmal atrial fibrillation (HCC)   2. Coronary artery disease involving native coronary artery of native heart without angina pectoris    PLAN:    In order of problems listed above:  Paroxysmal atrial fibrillation, maintaining sinus rhythm.  Continue amiodarone 200 mg daily, Eliquis.  Repeat TSH and free T4 in 3 months.  Previous thyroid function was normal. Nonobstructive CAD, moderate LAD disease.  Denies chest pain.  EF 60 to 65% Eliquis as above, cholesterol control off statin therapy  F/u in 6 months  Total encounter time 40 minutes  Greater than 50% was spent in counseling and coordination of care with the patient   This note was generated in part or whole with voice recognition software. Voice recognition is usually quite accurate but there are transcription errors that can and very often do occur. I apologize for any typographical errors that were not detected and corrected.  Medication Adjustments/Labs and Tests Ordered: Current medicines are reviewed at length with the patient today.  Concerns regarding medicines are outlined above.  Orders Placed This Encounter  Procedures   TSH   T4, free   EKG 12-Lead   No orders of the defined types were placed in this encounter.   Patient Instructions  Medication Instructions:   Your physician recommends that  you continue on your current medications as directed. Please refer to the Current Medication list given to you today.  *If you need a refill on your cardiac medications before your next appointment, please call your pharmacy*   Lab Work:  Your physician recommends that you return for lab work in: 3 months (around the end of April)  at the medical mall. No appt is needed. Hours are M-F 7AM- 6 PM.  If you have labs (blood work) drawn today and your tests are completely normal, you will receive your results only by: [place] (if you have MyChart) OR A paper copy in the mail If you have any lab test that is abnormal or we need to change your treatment, we will call you to review the results.   Testing/Procedures:  None Ordered   Follow-Up: At [facility], you and your health needs are our priority.  As part of our continuing mission to provide you with exceptional heart care, we have created designated Provider Care Teams.  These Care Teams include your primary Cardiologist (physician) and Advanced Practice Providers (APPs -  Physician Assistants and Nurse Practitioners) who all work together to provide you with the care you need, when you need it.  We recommend signing up for the patient portal called "MyChart".  Sign up information is provided on this After Visit Summary.  MyChart is used to connect with patients for Virtual Visits (Telemedicine).  Patients are able to view lab/test results, encounter notes, upcoming appointments, etc.  Non-urgent messages can be sent to your provider as well.   To learn more about what you can do with MyChart, go to [email or web address].    Your next appointment:   6 month(s)  Provider:   You may see Kate Sable, MD or one of the following Advanced Practice Providers on your designated Care Team:   Murray Hodgkins, NP Christell Faith, PA-C Cadence Kathlen Mody, PA-C Gerrie Nordmann, NP   Signed, Kate Sable, MD  05/27/2022  10:13 AM    [place]

## 2022-05-27 NOTE — Telephone Encounter (Signed)
Dr Silvio Pate is out of the office today. He can write a script for PT on Monday and we can fax it to [facility])

## 2022-05-27 NOTE — Patient Instructions (Signed)
Medication Instructions:   Your physician recommends that you continue on your current medications as directed. Please refer to the Current Medication list given to you today.  *If you need a refill on your cardiac medications before your next appointment, please call your pharmacy*   Lab Work:  Your physician recommends that you return for lab work in: 3 months (around the end of April)  at the medical mall. No appt is needed. Hours are M-F 7AM- 6 PM.  If you have labs (blood work) drawn today and your tests are completely normal, you will receive your results only by: [place] (if you have MyChart) OR A paper copy in the mail If you have any lab test that is abnormal or we need to change your treatment, we will call you to review the results.   Testing/Procedures:  None Ordered   Follow-Up: At [facility], you and your health needs are our priority.  As part of our continuing mission to provide you with exceptional heart care, we have created designated Provider Care Teams.  These Care Teams include your primary Cardiologist (physician) and Advanced Practice Providers (APPs -  Physician Assistants and Nurse Practitioners) who all work together to provide you with the care you need, when you need it.  We recommend signing up for the patient portal called "MyChart".  Sign up information is provided on this After Visit Summary.  MyChart is used to connect with patients for Virtual Visits (Telemedicine).  Patients are able to view lab/test results, encounter notes, upcoming appointments, etc.  Non-urgent messages can be sent to your provider as well.   To learn more about what you can do with MyChart, go to [email or web address].    Your next appointment:   6 month(s)  Provider:   You may see Kate Sable, MD or one of the following Advanced Practice Providers on your designated Care Team:   Murray Hodgkins, NP Christell Faith, PA-C Cadence Kathlen Mody, PA-C Gerrie Nordmann, NP

## 2022-05-31 ENCOUNTER — Telehealth: Payer: Self-pay | Admitting: Internal Medicine

## 2022-05-31 DIAGNOSIS — R2681 Unsteadiness on feet: Secondary | ICD-10-CM | POA: Diagnosis not present

## 2022-05-31 DIAGNOSIS — G3184 Mild cognitive impairment, so stated: Secondary | ICD-10-CM | POA: Diagnosis not present

## 2022-05-31 NOTE — Telephone Encounter (Signed)
Patient called and said she need PT for knee and hip and [facility] need authorization. Call back number [phone].

## 2022-05-31 NOTE — Telephone Encounter (Signed)
There is already a message about this. It was taken care of earlier today.

## 2022-05-31 NOTE — Telephone Encounter (Signed)
Ariel Phillips from [facility] called over and stated they haven't received the order for PT yet. Her appointment is today at 3:00 PM. She stated that it can be faxed over [phone]. Thank you!

## 2022-05-31 NOTE — Telephone Encounter (Signed)
We have been with patients all morning. I just now faxed it and received a confirmation.

## 2022-06-02 DIAGNOSIS — R2681 Unsteadiness on feet: Secondary | ICD-10-CM | POA: Diagnosis not present

## 2022-06-02 DIAGNOSIS — G3184 Mild cognitive impairment, so stated: Secondary | ICD-10-CM | POA: Diagnosis not present

## 2022-06-07 DIAGNOSIS — G3184 Mild cognitive impairment, so stated: Secondary | ICD-10-CM | POA: Diagnosis not present

## 2022-06-07 DIAGNOSIS — R2681 Unsteadiness on feet: Secondary | ICD-10-CM | POA: Diagnosis not present

## 2022-06-09 DIAGNOSIS — G3184 Mild cognitive impairment, so stated: Secondary | ICD-10-CM | POA: Diagnosis not present

## 2022-06-09 DIAGNOSIS — R2681 Unsteadiness on feet: Secondary | ICD-10-CM | POA: Diagnosis not present

## 2022-06-13 ENCOUNTER — Other Ambulatory Visit: Payer: Self-pay

## 2022-06-13 MED ORDER — AMIODARONE HCL 200 MG PO TABS: 200.0000 mg | ORAL_TABLET | Freq: Every day | ORAL | 6 refills | Status: DC

## 2022-06-14 DIAGNOSIS — G3184 Mild cognitive impairment, so stated: Secondary | ICD-10-CM | POA: Diagnosis not present

## 2022-06-14 DIAGNOSIS — R2681 Unsteadiness on feet: Secondary | ICD-10-CM | POA: Diagnosis not present

## 2022-06-16 DIAGNOSIS — R2681 Unsteadiness on feet: Secondary | ICD-10-CM | POA: Diagnosis not present

## 2022-06-16 DIAGNOSIS — G3184 Mild cognitive impairment, so stated: Secondary | ICD-10-CM | POA: Diagnosis not present

## 2022-06-21 DIAGNOSIS — R2681 Unsteadiness on feet: Secondary | ICD-10-CM | POA: Diagnosis not present

## 2022-06-21 DIAGNOSIS — G3184 Mild cognitive impairment, so stated: Secondary | ICD-10-CM | POA: Diagnosis not present

## 2022-06-23 DIAGNOSIS — R2681 Unsteadiness on feet: Secondary | ICD-10-CM | POA: Diagnosis not present

## 2022-06-23 DIAGNOSIS — G3184 Mild cognitive impairment, so stated: Secondary | ICD-10-CM | POA: Diagnosis not present

## 2022-06-28 DIAGNOSIS — G3184 Mild cognitive impairment, so stated: Secondary | ICD-10-CM | POA: Diagnosis not present

## 2022-06-28 DIAGNOSIS — R2681 Unsteadiness on feet: Secondary | ICD-10-CM | POA: Diagnosis not present

## 2022-06-30 DIAGNOSIS — G3184 Mild cognitive impairment, so stated: Secondary | ICD-10-CM | POA: Diagnosis not present

## 2022-06-30 DIAGNOSIS — R2681 Unsteadiness on feet: Secondary | ICD-10-CM | POA: Diagnosis not present

## 2022-07-05 DIAGNOSIS — G3184 Mild cognitive impairment, so stated: Secondary | ICD-10-CM | POA: Diagnosis not present

## 2022-07-05 DIAGNOSIS — R2681 Unsteadiness on feet: Secondary | ICD-10-CM | POA: Diagnosis not present

## 2022-07-07 DIAGNOSIS — R2681 Unsteadiness on feet: Secondary | ICD-10-CM | POA: Diagnosis not present

## 2022-07-07 DIAGNOSIS — G3184 Mild cognitive impairment, so stated: Secondary | ICD-10-CM | POA: Diagnosis not present

## 2022-07-11 ENCOUNTER — Encounter: Payer: Self-pay | Admitting: Internal Medicine

## 2022-07-11 DIAGNOSIS — M25552 Pain in left hip: Secondary | ICD-10-CM

## 2022-07-12 ENCOUNTER — Other Ambulatory Visit: Payer: Self-pay

## 2022-07-12 ENCOUNTER — Other Ambulatory Visit: Payer: Medicare Other

## 2022-07-12 ENCOUNTER — Other Ambulatory Visit: Payer: Self-pay | Admitting: Internal Medicine

## 2022-07-12 ENCOUNTER — Ambulatory Visit (INDEPENDENT_AMBULATORY_CARE_PROVIDER_SITE_OTHER)
Admission: RE | Admit: 2022-07-12 | Discharge: 2022-07-12 | Disposition: A | Discharge status: Home / Self Care | Payer: Medicare Other | Source: Ambulatory Visit | Attending: Internal Medicine | Admitting: Internal Medicine

## 2022-07-12 ENCOUNTER — Encounter: Payer: Self-pay | Admitting: Cardiology

## 2022-07-12 DIAGNOSIS — G3184 Mild cognitive impairment, so stated: Secondary | ICD-10-CM | POA: Diagnosis not present

## 2022-07-12 DIAGNOSIS — M25552 Pain in left hip: Secondary | ICD-10-CM

## 2022-07-12 DIAGNOSIS — R2681 Unsteadiness on feet: Secondary | ICD-10-CM | POA: Diagnosis not present

## 2022-07-12 NOTE — Telephone Encounter (Signed)
Spoke to pt. She is coming today at 1030.

## 2022-07-13 ENCOUNTER — Other Ambulatory Visit: Payer: Self-pay

## 2022-07-13 MED ORDER — AMIODARONE HCL 200 MG PO TABS: 200.0000 mg | ORAL_TABLET | Freq: Every day | ORAL | 6 refills | Status: DC

## 2022-07-13 NOTE — Telephone Encounter (Signed)
Continue amiodarone '200mg'$  daily

## 2022-07-14 DIAGNOSIS — G3184 Mild cognitive impairment, so stated: Secondary | ICD-10-CM | POA: Diagnosis not present

## 2022-07-14 DIAGNOSIS — R2681 Unsteadiness on feet: Secondary | ICD-10-CM | POA: Diagnosis not present

## 2022-07-15 ENCOUNTER — Telehealth: Payer: Self-pay | Admitting: Internal Medicine

## 2022-07-15 NOTE — Telephone Encounter (Signed)
Patient called in and would like her results from the xray she had done and the comment to be sent over to the physical therapist at [facility]. Please advise. Thank you

## 2022-07-15 NOTE — Telephone Encounter (Signed)
Xray report routed to PT at [phone]

## 2022-07-19 DIAGNOSIS — G3184 Mild cognitive impairment, so stated: Secondary | ICD-10-CM | POA: Diagnosis not present

## 2022-07-19 DIAGNOSIS — R2681 Unsteadiness on feet: Secondary | ICD-10-CM | POA: Diagnosis not present

## 2022-07-21 DIAGNOSIS — G3184 Mild cognitive impairment, so stated: Secondary | ICD-10-CM | POA: Diagnosis not present

## 2022-07-21 DIAGNOSIS — R2681 Unsteadiness on feet: Secondary | ICD-10-CM | POA: Diagnosis not present

## 2022-07-22 DIAGNOSIS — R001 Bradycardia, unspecified: Secondary | ICD-10-CM | POA: Diagnosis not present

## 2022-07-22 DIAGNOSIS — G3184 Mild cognitive impairment, so stated: Secondary | ICD-10-CM | POA: Diagnosis not present

## 2022-07-22 DIAGNOSIS — G250 Essential tremor: Secondary | ICD-10-CM | POA: Diagnosis not present

## 2022-07-26 DIAGNOSIS — R2681 Unsteadiness on feet: Secondary | ICD-10-CM | POA: Diagnosis not present

## 2022-07-26 DIAGNOSIS — G3184 Mild cognitive impairment, so stated: Secondary | ICD-10-CM | POA: Diagnosis not present

## 2022-07-28 DIAGNOSIS — G3184 Mild cognitive impairment, so stated: Secondary | ICD-10-CM | POA: Diagnosis not present

## 2022-07-28 DIAGNOSIS — R2681 Unsteadiness on feet: Secondary | ICD-10-CM | POA: Diagnosis not present

## 2022-08-02 DIAGNOSIS — G3184 Mild cognitive impairment, so stated: Secondary | ICD-10-CM | POA: Diagnosis not present

## 2022-08-02 DIAGNOSIS — R2681 Unsteadiness on feet: Secondary | ICD-10-CM | POA: Diagnosis not present

## 2022-08-04 DIAGNOSIS — G3184 Mild cognitive impairment, so stated: Secondary | ICD-10-CM | POA: Diagnosis not present

## 2022-08-04 DIAGNOSIS — R2681 Unsteadiness on feet: Secondary | ICD-10-CM | POA: Diagnosis not present

## 2022-08-09 DIAGNOSIS — Z23 Encounter for immunization: Secondary | ICD-10-CM | POA: Diagnosis not present

## 2022-08-12 ENCOUNTER — Telehealth: Payer: Self-pay | Admitting: Internal Medicine

## 2022-08-12 ENCOUNTER — Telehealth: Payer: Self-pay | Admitting: Cardiology

## 2022-08-12 DIAGNOSIS — I480 Paroxysmal atrial fibrillation: Secondary | ICD-10-CM

## 2022-08-12 MED ORDER — APIXABAN 5 MG PO TABS: 5.0000 mg | ORAL_TABLET | Freq: Two times a day (BID) | ORAL | 0 refills | Status: DC

## 2022-08-12 NOTE — Telephone Encounter (Signed)
Spoke with Trinna Post, at pharmacy.  Eliquis was ordered by cardiology.  Gave him the number for cardiology office.  [phone]

## 2022-08-12 NOTE — Telephone Encounter (Signed)
Prescription Request  08/12/2022  LOV: 02/16/2022  What is the name of the medication or equipment?  apixaban (ELIQUIS) 5 MG TABS tablet  Have you contacted your pharmacy to request a refill? Yes   Which pharmacy would you like this sent to?   [id number] - [place] [place] - [address] [place] [address] Phone: [phone] Fax: [phone]    Patient notified that their request is being sent to the clinical staff for review and that they should receive a response within 2 business days.   Please advise at Mobile [phone] (mobile)

## 2022-08-12 NOTE — Telephone Encounter (Signed)
Do they need a 30 day or 90 day supply?  Enough for the weekend   Caller stated patient is out of town and forgot her medication and wants to get a few tablets for the weekend.   Rx sent to pharmacy of choice for enough to cover the weekend [id number] - [place] [place] - [address]

## 2022-08-12 NOTE — Telephone Encounter (Signed)
*  STAT* If patient is at the pharmacy, call can be transferred to refill team.   1. Which medications need to be refilled? (please list name of each medication and dose if known)   apixaban (ELIQUIS) 5 MG TABS tablet   2. Which pharmacy/location (including street and city if local pharmacy) is medication to be sent to?  [id number] - [place], [place] - [address]   3. Do they need a 30 day or 90 day supply?  Enough for the weekend  Caller stated patient is out of town and forgot her medication and wants to get a few tablets for the weekend.

## 2022-08-12 NOTE — Addendum Note (Signed)
Addended by: Memory Dance on: 08/12/2022 04:57 PM   Modules accepted: Orders

## 2022-08-12 NOTE — Telephone Encounter (Signed)
Prescription refill request for Eliquis received. Indication: Afib  Last office visit: 05/27/22 ([facility])  Scr: 0.72 (04/18/22)  Age: 89 Weight: 68.4kg  Appropriate dose. Refill sent.

## 2022-12-02 ENCOUNTER — Ambulatory Visit: Payer: Medicare Other | Admitting: Student

## 2022-12-02 ENCOUNTER — Encounter: Payer: Self-pay | Admitting: Student

## 2022-12-02 VITALS — BP 138/88 | HR 86 | Temp 98.3°F | Ht 64.0 in | Wt 149.0 lb

## 2022-12-02 DIAGNOSIS — M79605 Pain in left leg: Secondary | ICD-10-CM | POA: Diagnosis not present

## 2022-12-02 DIAGNOSIS — G3184 Mild cognitive impairment, so stated: Secondary | ICD-10-CM

## 2022-12-02 NOTE — Patient Instructions (Addendum)
I have written an order for x-ray of the hip and thigh pain.   You also have physical therapy that should be scheduled in coming weeks.   Please follow up with PCP in the next month.

## 2022-12-02 NOTE — Progress Notes (Signed)
[facility] [place] [facility].   Provider: Dr. Earnestine Mealing  Code Status: DNR Goals of Care:     12/02/2022    1:23 PM  Advanced Directives  Does Patient Have a Medical Advance Directive? Yes  Type of Estate agent of [place];Out of facility DNR (pink MOST or yellow form);Living will  Does patient want to make changes to medical advance directive? No - Patient declined  Copy of Healthcare Power of Attorney in Chart? Yes - validated most recent copy scanned in chart (See row information)     Chief Complaint  Patient presents with   Acute Visit    Left Leg Pain and Weakness for about 1 Month. No Trauma .    HPI: Patient is a 89 y.o. female seen today for an acute visit for Left Leg Pain and Weakness for about 1 month. No Trauma.   She has had progressive weakness in her left leg. It started in the foot and owrked it's way up. She feels like it's a balance issue. It's been 1 week or so. Initially it was in the foot, knee now in the hip. She isn't sure how long it's been initially says it was 6 months then says a couple of weeks ago. NO pain. No trauma. Denies falls in the homes. Denies weakness anywhere  She broke her ankle in 2022 but she couldn't remember the time when it occurred.   She lives at home with her cat. Her medications she states her memory is really going. She takes levothyroxine in the morning. "When I get up in the morning before breakfast take levothyroxine. I go back to bed and I t seems like I take  Oh really" For atrial fibrillation. She doesn't take it anymore. When the prescription ran out she stopped taking them because that's what she thought she was supposed to do.   If you take the road towards [place] you cross another big road and the train station is on there. I take [place] goes under the bridge and that's the railword line and turn left there and the station is there.   I was in physical therapy at the time of the last x-ray  (07/2022). I guess it was probably a year ago.   They think I should start living in assisted living.   I did tell you my balance is bad - repeated 6 times every 90 seconds  Past Medical History:  Diagnosis Date   Allergic asthma    Allergic rhinitis due to pollen    Allergy    GERD (gastroesophageal reflux disease)    IBS (irritable bowel syndrome)    Osteopenia    Unspecified hypothyroidism     Past Surgical History:  Procedure Laterality Date   ANAL FISSURE REPAIR  1950's   BASAL CELL CARCINOMA EXCISION  2000's   nose   CHOLECYSTECTOMY  2008   COLONOSCOPY  Multiple   Negative screening exams   ENDOSCOPIC RETROGRADE CHOLANGIOPANCREATOGRAPHY (ERCP) WITH PROPOFOL N/A 02/13/2019   Procedure: ENDOSCOPIC RETROGRADE CHOLANGIOPANCREATOGRAPHY (ERCP) WITH PROPOFOL;  Surgeon: Iva Boop, MD;  Location: [facility];  Service: Endoscopy;  Laterality: N/A;   HEMORRHOID SURGERY  1953 or so   with fissure repair   ORIF ANKLE FRACTURE Right 06/27/2020   Procedure: OPEN REDUCTION INTERNAL FIXATION (ORIF) ANKLE FRACTURE;  Surgeon: Rosetta Posner, DPM;  Location: [facility];  Service: Podiatry;  Laterality: Right;   REMOVAL OF STONES  02/13/2019   Procedure: REMOVAL OF STONES;  Surgeon: Iva Boop, MD;  Location: [facility];  Service: Endoscopy;;   SPHINCTEROTOMY  02/13/2019   Procedure: Dennison Mascot;  Surgeon: Iva Boop, MD;  Location: [facility];  Service: Endoscopy;;   TUBAL LIGATION      Allergies  Allergen Reactions   Grass Pollen(K-O-R-T-Swt Vern) Other (See Comments)   Penicillins Other (See Comments)    Did it involve swelling of the face/tongue/throat, SOB, or low BP? No Did it involve sudden or severe rash/hives, skin peeling, or any reaction on the inside of your mouth or nose? No Did you need to seek medical attention at a hospital or doctor's office? Yes When did it last happen?      20+ years If all above answers are "NO", may proceed with cephalosporin  use.  Pt does not remember the reaction   Latex Rash    Outpatient Encounter Medications as of 12/02/2022  Medication Sig   albuterol (PROAIR HFA) 108 (90 Base) MCG/ACT inhaler    alum & mag hydroxide-simeth (MAALOX PLUS) 400-400-40 MG/5ML suspension Take 10 mLs by mouth as needed for indigestion.   amiodarone (PACERONE) 200 MG tablet TAKE ONE TABLET (200 MG) BY MOUTH EVERY DAY   apixaban (ELIQUIS) 5 MG TABS tablet Take 1 tablet (5 mg total) by mouth 2 (two) times daily.   carboxymethylcellulose (REFRESH PLUS) 0.5 % SOLN Place 2 drops into both eyes daily as needed (dry eyes).    cetirizine (ZYRTEC) 10 MG tablet Take 10 mg by mouth daily.   diclofenac Sodium (VOLTAREN) 1 % GEL Apply 2 g topically 4 (four) times daily as needed.   levothyroxine (SYNTHROID) 75 MCG tablet TAKE 1 TABLET EVERY DAY ON EMPTY STOMACHWITH A GLASS OF WATER AT LEAST 30-60 MINBEFORE BREAKFAST   loratadine (CLARITIN) 10 MG tablet Take 10 mg by mouth at bedtime.    omeprazole (PRILOSEC) 20 MG capsule TAKE 1 CAPSULE BY MOUTH AT BEDTIME   No facility-administered encounter medications on file as of 12/02/2022.    Review of Systems:  Review of Systems  Health Maintenance  Topic Date Due   DEXA SCAN  Never done   MAMMOGRAM  05/29/2014   COVID-19 Vaccine (6 - 2023-24 season) 12/31/2021   INFLUENZA VACCINE  12/01/2022   Medicare Annual Wellness (AWV)  02/17/2023   DTaP/Tdap/Td (3 - Td or Tdap) 06/26/2030   Pneumonia Vaccine 58+ Years old  Completed   Zoster Vaccines- Shingrix  Completed   HPV VACCINES  Aged Out    Physical Exam: Vitals:   12/02/22 1320  BP: 138/88  Pulse: 86  Temp: 98.3 F (36.8 C)  SpO2: 97%  Weight: 149 lb (67.6 kg)  Height: 5\' 4"  (1.626 m)   Body mass index is 25.58 kg/m. Physical Exam Vitals reviewed.  Constitutional:      Appearance: Normal appearance.  Cardiovascular:     Rate and Rhythm: Normal rate and regular rhythm.  Musculoskeletal:     Comments: 5/5 Strength of the right  leg Left leg elevates against gravity and 5/5 strength, however, patient has pain with flection of the hip mid-thigh.   Neurological:     Mental Status: She is alert.     Labs reviewed: Basic Metabolic Panel: Recent Labs    02/11/22 1018 04/18/22 0854  NA 141 138  K 3.8 3.7  CL 114* 107  CO2 21* 23  GLUCOSE 108* 99  BUN 20 16  CREATININE 0.93 0.72  CALCIUM 8.3* 8.7*  MG 2.1  --   TSH 4.237  --  Liver Function Tests: Recent Labs    02/11/22 1018 04/18/22 0854  AST 25 22  ALT 15 15  ALKPHOS 56 64  BILITOT 0.7 1.2  PROT 6.3* 7.1  ALBUMIN 3.5 3.9   No results for input(s): "LIPASE", "AMYLASE" in the last 8760 hours. No results for input(s): "AMMONIA" in the last 8760 hours. CBC: Recent Labs    02/11/22 1018 04/18/22 0854  WBC 6.0 5.5  NEUTROABS 4.1  --   HGB 13.8 14.8  HCT 42.0 45.6  MCV 94.6 95.0  PLT 256 268   Lipid Panel: No results for input(s): "CHOL", "HDL", "LDLCALC", "TRIG", "CHOLHDL", "LDLDIRECT" in the last 8760 hours. No results found for: "HGBA1C"  Procedures since last visit: No results found.  Assessment/Plan Left leg pain - Plan: DG Hip Unilat W OR W/O Pelvis 1V Left, DG FEMUR MIN 2 VIEWS LEFT, Ambulatory referral to Physical Therapy  MCI (mild cognitive impairment) Patient with leg pain for unclear period of time. Initially she stated she had numbness for 6 months then 1-2 weeks. Discussed cconcern for her memory. Messaged on campus social worker regarding her inability to accurately provide a history as well as confusion with regards to the plan for imaging and physical therapy. Encouraged patient to follow up with PCP.   Labs/tests ordered:  * No order type specified * Next appt:  Visit date not found

## 2022-12-06 ENCOUNTER — Other Ambulatory Visit: Payer: Self-pay | Admitting: Student

## 2022-12-06 ENCOUNTER — Ambulatory Visit
Admission: RE | Admit: 2022-12-06 | Discharge: 2022-12-06 | Disposition: A | Discharge status: Home / Self Care | Payer: Medicare Other | Source: Ambulatory Visit | Attending: Student | Admitting: Student

## 2022-12-06 DIAGNOSIS — M79652 Pain in left thigh: Secondary | ICD-10-CM | POA: Diagnosis not present

## 2022-12-06 DIAGNOSIS — M160 Bilateral primary osteoarthritis of hip: Secondary | ICD-10-CM | POA: Diagnosis not present

## 2022-12-06 DIAGNOSIS — M79605 Pain in left leg: Secondary | ICD-10-CM | POA: Diagnosis not present

## 2022-12-06 DIAGNOSIS — M1612 Unilateral primary osteoarthritis, left hip: Secondary | ICD-10-CM | POA: Diagnosis not present

## 2022-12-06 DIAGNOSIS — M25552 Pain in left hip: Secondary | ICD-10-CM | POA: Diagnosis not present

## 2022-12-06 DIAGNOSIS — M47816 Spondylosis without myelopathy or radiculopathy, lumbar region: Secondary | ICD-10-CM | POA: Diagnosis not present

## 2022-12-06 DIAGNOSIS — M25559 Pain in unspecified hip: Secondary | ICD-10-CM | POA: Diagnosis not present

## 2022-12-06 DIAGNOSIS — G3184 Mild cognitive impairment, so stated: Secondary | ICD-10-CM

## 2022-12-08 ENCOUNTER — Telehealth: Payer: Self-pay | Admitting: Cardiology

## 2022-12-08 MED ORDER — AMIODARONE HCL 200 MG PO TABS: 200.0000 mg | ORAL_TABLET | Freq: Every day | ORAL | 0 refills | Status: DC

## 2022-12-08 NOTE — Telephone Encounter (Signed)
Refill request for eliquis.

## 2022-12-08 NOTE — Telephone Encounter (Signed)
Requested Prescriptions   Signed Prescriptions Disp Refills   amiodarone (PACERONE) 200 MG tablet 30 tablet 0    Sig: Take 1 tablet (200 mg total) by mouth daily. TAKE ONE TABLET (200 MG) BY MOUTH EVERY DAY    Authorizing Provider: Debbe Odea    Ordering User: Guerry Minors

## 2022-12-08 NOTE — Telephone Encounter (Signed)
  Pt c/o medication issue:  1. Name of Medication: amiodarone (PACERONE) 200 MG tablet   apixaban (ELIQUIS) 5 MG TABS tablet   2. How are you currently taking this medication (dosage and times per day)? As written   3. Are you having a reaction (difficulty breathing--STAT)? No   4. What is your medication issue? Charese from [facility] called to verify whether the patient should continue taking Amiodarone and Eliquis. If the medications are to be continued, she kindly requests that refills be sent to [facility].

## 2022-12-09 ENCOUNTER — Other Ambulatory Visit: Payer: Self-pay

## 2022-12-09 DIAGNOSIS — I480 Paroxysmal atrial fibrillation: Secondary | ICD-10-CM

## 2022-12-09 MED ORDER — APIXABAN 5 MG PO TABS: 5.0000 mg | ORAL_TABLET | Freq: Two times a day (BID) | ORAL | 5 refills | Status: AC

## 2022-12-09 NOTE — Telephone Encounter (Signed)
Prescription refill request for Eliquis received. Indication:afib Last office visit:1/24 Scr:0.72  10/23 Age: 89 Weight:67.6  kg  Prescription refilled

## 2022-12-13 DIAGNOSIS — H2513 Age-related nuclear cataract, bilateral: Secondary | ICD-10-CM | POA: Diagnosis not present

## 2022-12-13 DIAGNOSIS — Q130 Coloboma of iris: Secondary | ICD-10-CM | POA: Diagnosis not present

## 2022-12-16 ENCOUNTER — Telehealth: Payer: Self-pay | Admitting: Internal Medicine

## 2022-12-16 DIAGNOSIS — M6281 Muscle weakness (generalized): Secondary | ICD-10-CM | POA: Diagnosis not present

## 2022-12-16 DIAGNOSIS — M79605 Pain in left leg: Secondary | ICD-10-CM | POA: Diagnosis not present

## 2022-12-16 NOTE — Telephone Encounter (Signed)
[facility] called in stated a request was fax to our office for the last 3 office notes . For pt nurse assessment for assistance living

## 2022-12-16 NOTE — Telephone Encounter (Signed)
Received the fax late yesterday in [place]. Just saw it this morning. Will take care of later today.

## 2022-12-16 NOTE — Telephone Encounter (Signed)
Notes routed through [employer]

## 2022-12-20 DIAGNOSIS — M6281 Muscle weakness (generalized): Secondary | ICD-10-CM | POA: Diagnosis not present

## 2022-12-20 DIAGNOSIS — M79605 Pain in left leg: Secondary | ICD-10-CM | POA: Diagnosis not present

## 2022-12-26 DIAGNOSIS — M79605 Pain in left leg: Secondary | ICD-10-CM | POA: Diagnosis not present

## 2022-12-26 DIAGNOSIS — M6281 Muscle weakness (generalized): Secondary | ICD-10-CM | POA: Diagnosis not present

## 2022-12-30 DIAGNOSIS — M6281 Muscle weakness (generalized): Secondary | ICD-10-CM | POA: Diagnosis not present

## 2022-12-30 DIAGNOSIS — M79605 Pain in left leg: Secondary | ICD-10-CM | POA: Diagnosis not present

## 2023-01-02 DIAGNOSIS — M79605 Pain in left leg: Secondary | ICD-10-CM | POA: Diagnosis not present

## 2023-01-02 DIAGNOSIS — M6281 Muscle weakness (generalized): Secondary | ICD-10-CM | POA: Diagnosis not present

## 2023-01-04 DIAGNOSIS — M79605 Pain in left leg: Secondary | ICD-10-CM | POA: Diagnosis not present

## 2023-01-04 DIAGNOSIS — M6281 Muscle weakness (generalized): Secondary | ICD-10-CM | POA: Diagnosis not present

## 2023-01-06 DIAGNOSIS — M79605 Pain in left leg: Secondary | ICD-10-CM | POA: Diagnosis not present

## 2023-01-06 DIAGNOSIS — M6281 Muscle weakness (generalized): Secondary | ICD-10-CM | POA: Diagnosis not present

## 2023-01-09 ENCOUNTER — Encounter: Payer: Self-pay | Admitting: Physician Assistant

## 2023-01-09 ENCOUNTER — Ambulatory Visit: Payer: Medicare Other | Attending: Physician Assistant | Admitting: Physician Assistant

## 2023-01-09 VITALS — BP 138/100 | HR 59 | Ht 64.0 in | Wt 142.0 lb

## 2023-01-09 DIAGNOSIS — I2510 Atherosclerotic heart disease of native coronary artery without angina pectoris: Secondary | ICD-10-CM | POA: Insufficient documentation

## 2023-01-09 DIAGNOSIS — I340 Nonrheumatic mitral (valve) insufficiency: Secondary | ICD-10-CM | POA: Diagnosis present

## 2023-01-09 DIAGNOSIS — I480 Paroxysmal atrial fibrillation: Secondary | ICD-10-CM | POA: Diagnosis not present

## 2023-01-09 DIAGNOSIS — M6281 Muscle weakness (generalized): Secondary | ICD-10-CM | POA: Diagnosis not present

## 2023-01-09 DIAGNOSIS — M79605 Pain in left leg: Secondary | ICD-10-CM | POA: Diagnosis not present

## 2023-01-09 NOTE — Progress Notes (Signed)
Cardiology Office Note    Date:  01/09/2023   ID:  Ariel Phillips, DOB March 12, 1933, MRN [id number]  PCP:  Karie Schwalbe, MD  Cardiologist:  Debbe Odea, MD  Electrophysiologist:  None   Chief Complaint: Follow up  History of Present Illness:   Ariel Phillips is a 89 y.o. female with history of nonobstructive CAD by coronary CTA, PAF diagnosed in 01/2022, and GERD who presents for follow-up of CAD and A-fib.  She was previously followed by Dr. Azucena Cecil in 2021.  Echo in 07/2019 showed an EF of 60 to 65%, no regional wall motion abnormalities, grade 1 diastolic dysfunction, normal RV systolic function, and ventricular cavity size.  Coronary CTA from 07/2019 showed a calcium score of 200 which was the 54th percentile.  There was a 50 to 69% stenosis involving the proximal, mid, and distal LAD, 25 to 49% stenosis in the proximal OM1, and a large dominant RCA without evidence of significant plaque.  ctFFR showed significant stenosis in the mid to distal LAD (0.76), though this was a small vessel with recommendation to continue antianginal therapy.  She transitioned her care to Dr. Gwen Pounds in 2023 with echo performed through [facility] cardiology in 10/2021 showing an EF of greater than 55%, normal wall motion, grade 1 diastolic dysfunction, normal RV systolic function, moderate aortic insufficiency, and mild mitral regurgitation.  Stress echo is unavailable for review.  She reestablished care with Dr. Azucena Cecil in 01/2022.  She was seen in the ED in 01/2022 with palpitations with EMS reporting A-fib with RVR with rates in the 150s to 170s and resolved.  Upon arrival, she was noted to be in A-fib with RVR.  She was slightly hypotensive at that time and not a candidate for beta-blocker or calcium channel blocker.  She was started on amiodarone with subsequent conversion to sinus rhythm.  She was placed on oral amiodarone and apixaban.  Echo from 03/2022 showed an EF of 60 to 65%, no  regional wall motion abnormalities, mild LVH of the basal septal segment, grade 1 diastolic dysfunction, normal RV systolic function and ventricular cavity size, mild to moderate mitral regurgitation, mild aortic insufficiency, borderline dilatation of the ascending aorta measuring 39 mm, and an estimated right atrial pressure of 3 mmHg.  She was last seen in the office in 05/2022 and was maintaining sinus rhythm on amiodarone with continuation of apixaban.  She comes in doing well from a cardiac perspective and is without symptoms of angina or cardiac decompensation.  No dyspnea, palpitations, dizziness, presyncope, or syncope.  No falls, hematochezia, or melena.  She is adherent to amiodarone and apixaban.  No lower extremity swelling or progressive orthopnea.  Has not seen an eye doctor in several years.  Will be moving into assisted care living with some mild cognitive impairment.  Otherwise, she is without acute cardiac concerns at this time.   Labs independently reviewed: 04/2022 - potassium 3.7, BUN 16, serum creatinine 0.72, albumin 3.9, AST/ALT normal, Hgb 14.8, PLT 268 01/2022 - magnesium 2.1, TSH normal 01/2020 - TC 172, TG 83, HDL 59, LDL 96  Past Medical History:  Diagnosis Date   Allergic asthma    Allergic rhinitis due to pollen    Allergy    GERD (gastroesophageal reflux disease)    IBS (irritable bowel syndrome)    Osteopenia    Unspecified hypothyroidism     Past Surgical History:  Procedure Laterality Date   ANAL FISSURE REPAIR  1950's   BASAL CELL  CARCINOMA EXCISION  2000's   nose   CHOLECYSTECTOMY  2008   COLONOSCOPY  Multiple   Negative screening exams   ENDOSCOPIC RETROGRADE CHOLANGIOPANCREATOGRAPHY (ERCP) WITH PROPOFOL N/A 02/13/2019   Procedure: ENDOSCOPIC RETROGRADE CHOLANGIOPANCREATOGRAPHY (ERCP) WITH PROPOFOL;  Surgeon: Iva Boop, MD;  Location: [facility];  Service: Endoscopy;  Laterality: N/A;   HEMORRHOID SURGERY  1953 or so   with fissure repair    ORIF ANKLE FRACTURE Right 06/27/2020   Procedure: OPEN REDUCTION INTERNAL FIXATION (ORIF) ANKLE FRACTURE;  Surgeon: Rosetta Posner, DPM;  Location: [facility];  Service: Podiatry;  Laterality: Right;   REMOVAL OF STONES  02/13/2019   Procedure: REMOVAL OF STONES;  Surgeon: Iva Boop, MD;  Location: [facility];  Service: Endoscopy;;   SPHINCTEROTOMY  02/13/2019   Procedure: Dennison Mascot;  Surgeon: Iva Boop, MD;  Location: [facility];  Service: Endoscopy;;   TUBAL LIGATION      Current Medications: Current Meds  Medication Sig   amiodarone (PACERONE) 200 MG tablet Take 1 tablet (200 mg total) by mouth daily. TAKE ONE TABLET (200 MG) BY MOUTH EVERY DAY   apixaban (ELIQUIS) 5 MG TABS tablet Take 1 tablet (5 mg total) by mouth 2 (two) times daily.   cetirizine (ZYRTEC) 10 MG tablet Take 10 mg by mouth daily.   levothyroxine (SYNTHROID) 75 MCG tablet TAKE 1 TABLET EVERY DAY ON EMPTY STOMACHWITH A GLASS OF WATER AT LEAST 30-60 MINBEFORE BREAKFAST    Allergies:   Grass pollen(k-o-r-t-swt vern), Penicillins, and Latex   Social History   Socioeconomic History   Marital status: Divorced    Spouse name: Not on file   Number of children: 1   Years of education: Not on file   Highest education level: Not on file  Occupational History   Occupation: Retired historian--consultant  Tobacco Use   Smoking status: Never    Passive exposure: Past   Smokeless tobacco: Never  Vaping Use   Vaping status: Never Used  Substance and Sexual Activity   Alcohol use: Yes    Comment: rare wine   Drug use: No   Sexual activity: Not Currently  Other Topics Concern   Not on file  Social History Narrative   Divorced and retired , moved from [place], [place]   1 Daughter in [place]   Has living will   Daughter has health care POA.---[name]   Requests DNR--order done 06/19/12   Would not want prolonged mechanical ventilation   May accept tube feeds temporarily but not if  cognitively unaware   Social Determinants of Health   Financial Resource Strain: Low Risk  (07/14/2021)   Overall Financial Resource Strain (CARDIA)    Difficulty of Paying Living Expenses: Not hard at all  Food Insecurity: No Food Insecurity (03/16/2022)   Hunger Vital Sign    Worried About Running Out of Food in the Last Year: Never true    Ran Out of Food in the Last Year: Never true  Transportation Needs: No Transportation Needs (03/16/2022)   PRAPARE - Administrator, Civil Service (Medical): No    Lack of Transportation (Non-Medical): No  Physical Activity: Not on file  Stress: Not on file  Social Connections: Not on file     Family History:  The patient's family history includes COPD in her mother; Cancer in her mother and paternal grandmother; Diabetes in her sister; Hypertension in her sister; Stroke in an other family member; Transient ischemic attack in her mother. There  is no history of Heart disease.  ROS:   12-point review of systems is negative unless otherwise noted in the HPI.   EKGs/Labs/Other Studies Reviewed:    Studies reviewed were summarized above. The additional studies were reviewed today:  2D echo 02/19/2022: 1. Left ventricular ejection fraction, by estimation, is 60 to 65%. The  left ventricle has normal function. The left ventricle has no regional  wall motion abnormalities. There is mild left ventricular hypertrophy of  the basal-septal segment. Left  ventricular diastolic parameters are consistent with Grade I diastolic  dysfunction (impaired relaxation). The average left ventricular global  longitudinal strain is -17.9 %.   2. Right ventricular systolic function is normal. The right ventricular  size is normal.   3. The mitral valve is normal in structure. Mild to moderate mitral valve  regurgitation. No evidence of mitral stenosis.   4. The aortic valve is tricuspid. Aortic valve regurgitation is mild. No  aortic stenosis is  present.   5. There is borderline dilatation of the ascending aorta, measuring 39  mm.   6. The inferior vena cava is normal in size with greater than 50%  respiratory variability, suggesting right atrial pressure of 3 mmHg.   Comparison(s): 07/11/19-EF 60-65%.  __________  Coronary CTA 07/11/2019: Aorta: Normal size. Mild descending aortic wall calcifications. No dissection.   Aortic Valve:  Trileaflet.  No calcifications.   Coronary Arteries:  Normal coronary origin.  Right dominance.   RCA is a large dominant artery that gives rise to PDA and PLA. There are luminal irregularities with no plaque.   Left main is a large artery that gives rise to LAD and LCX arteries.   LAD is a large vessel that has calcified and non-calcified plaque in the proximal vessel causing at least moderate (50-69%) stenosis, calcium/blooming artifacts prevents clear assessment of stenosis. The mid to distal LAD also has calcified and non-calcified plaque causing moderate (50-69%) stenosis.   LCX is a non-dominant artery that gives rise to 2 OM branches. There is calcified plaque in the proximal OM1 branch causing mild stenosis (25-49%).   Other findings:   Normal pulmonary vein drainage into the left atrium.   Normal left atrial appendage without a thrombus.   Normal size of the pulmonary artery.   IMPRESSION: 1. Coronary calcium score of 200. This was 42 percentile for age and sex matched control. 2. Normal coronary origin with right dominance. 3. Atleast moderate stenosis of the proximal and mid to distal LAD. 4. CAD-RADS 3. Moderate stenosis. Consider symptom-guided anti-ischemic pharmacotherapy as well as risk factor modification per guideline directed care. Additional analysis with CT FFR will be submitted.  ctFFR: 1. Left Main:  No significant stenosis. 2. LAD: No significant stenosis in the LAD. Significant stenosis in the mid to distal second diagonal artery, FFR 0.76 3. LCX: No  significant stenosis.  FFR not obtainable 4. RCA: No significant stenosis.  FFR 0.95   IMPRESSION: 1. CT FFR analysis didn't showed significant stenosis in the distal second diagonal artery. This is a small vessel. Recommend antianginal therapy __________  2D echo 07/11/2019: 1. Left ventricular ejection fraction, by estimation, is 60 to 65%. The  left ventricle has normal function. The left ventricle has no regional  wall motion abnormalities. Left ventricular diastolic parameters are  consistent with Grade I diastolic  dysfunction (impaired relaxation).   2. Right ventricular systolic function is normal. The right ventricular  size is normal. Tricuspid regurgitation signal is inadequate for assessing  PA  pressure.    EKG:  EKG is ordered today.  The EKG ordered today demonstrates NSR, 89 bpm, possible prior anterior infarct vs lead placement, no acute st/t changes  Recent Labs: 02/11/2022: B Natriuretic Peptide 125.2; Magnesium 2.1; TSH 4.237 04/18/2022: ALT 15; BUN 16; Creatinine, Ser 0.72; Hemoglobin 14.8; Platelets 268; Potassium 3.7; Sodium 138  Recent Lipid Panel    Component Value Date/Time   CHOL 172 02/04/2020 1214   TRIG 83.0 02/04/2020 1214   HDL 59.60 02/04/2020 1214   CHOLHDL 3 02/04/2020 1214   VLDL 16.6 02/04/2020 1214   LDLCALC 96 02/04/2020 1214    PHYSICAL EXAM:    VS:  BP (!) 138/100 (BP Location: Left Arm, Patient Position: Sitting, Cuff Size: Normal)   Pulse (!) 59   Ht 5\' 4"  (1.626 m)   Wt 142 lb (64.4 kg)   SpO2 93%   BMI 24.37 kg/m   BMI: Body mass index is 24.37 kg/m.  Physical Exam Vitals reviewed.  Constitutional:      Appearance: She is well-developed.  HENT:     Head: Normocephalic and atraumatic.  Eyes:     General:        Right eye: No discharge.        Left eye: No discharge.  Neck:     Vascular: No JVD.  Cardiovascular:     Rate and Rhythm: Normal rate and regular rhythm.     Pulses:          Posterior tibial pulses are 2+  on the right side and 2+ on the left side.     Heart sounds: S1 normal and S2 normal. Heart sounds not distant. No midsystolic click and no opening snap. Murmur heard.     Systolic murmur is present with a grade of 1/6 at the lower left sternal border.     No friction rub.  Pulmonary:     Effort: Pulmonary effort is normal. No respiratory distress.     Breath sounds: Normal breath sounds. No decreased breath sounds, wheezing, rhonchi or rales.  Chest:     Chest wall: No tenderness.  Abdominal:     General: There is no distension.  Musculoskeletal:     Cervical back: Normal range of motion.     Right lower leg: No edema.     Left lower leg: No edema.  Skin:    General: Skin is warm and dry.     Nails: There is no clubbing.  Neurological:     Mental Status: She is alert and oriented to person, place, and time.  Psychiatric:        Speech: Speech normal.        Behavior: Behavior normal.        Thought Content: Thought content normal.        Judgment: Judgment normal.     Wt Readings from Last 3 Encounters:  01/09/23 142 lb (64.4 kg)  12/02/22 149 lb (67.6 kg)  05/27/22 150 lb 12.8 oz (68.4 kg)     ASSESSMENT & PLAN:   Nonobstructive CAD: No symptoms suggestive of angina.  Remains on apixaban complains of aspirin given underlying A-fib in an effort to minimize bleeding risk.  Not currently on statin with LDL of 96.  No indication for ischemic testing at this time.  PAF: Maintaining sinus rhythm on amiodarone 200 mg daily.  CHA2DS2-VASc 4.  Remains on apixaban 5 mg twice daily and does not meet reduced dosing criteria.  No falls or symptoms  concerning for bleeding.  Check maintenance labs including CMP, TSH, and CBC.  Recommend annual eye exam.  Mitral regurgitation/borderline dilatation of the ascending aorta: Mild to moderate mitral regurgitation and borderline dilatation of the ascending aorta by echo in 2023.  Periodic monitoring by echo.    Disposition: F/u with Dr.  Azucena Cecil or an APP in 6 months.   Medication Adjustments/Labs and Tests Ordered: Current medicines are reviewed at length with the patient today.  Concerns regarding medicines are outlined above. Medication changes, Labs and Tests ordered today are summarized above and listed in the Patient Instructions accessible in Encounters.   Signed, Eula Listen, PA-C 01/09/2023 4:59 PM     [facility] [address] [place], [place] [address] [phone]

## 2023-01-09 NOTE — Patient Instructions (Addendum)
Medication Instructions:  Your Physician recommend you continue on your current medication as directed.    *If you need a refill on your cardiac medications before your next appointment, please call your pharmacy*  Lab Work: Your provider would like for you to have following labs drawn this week CMET, TSH and CBC.    Please go to [facility] [address], [place] [address] You do not need an appointment.  They are open from 7:30 am-4 pm.  Lunch from 1:00 pm- 2:00 pm You DO NOT need to be fasting.  You may also go to any of these [facility] locations:  [employer]  - [address] [employer] [address] Chief Technology Officer)  If you have labs (blood work) drawn today and your tests are completely normal, you will receive your results only by: [employer] (if you have MyChart) OR A paper copy in the mail If you have any lab test that is abnormal or we need to change your treatment, we will call you to review the results.  Testing/Procedures: None  Follow-Up: At [facility], you and your health needs are our priority.  As part of our continuing mission to provide you with exceptional heart care, we have created designated Provider Care Teams.  These Care Teams include your primary Cardiologist (physician) and Advanced Practice Providers (APPs -  Physician Assistants and Nurse Practitioners) who all work together to provide you with the care you need, when you need it.  We recommend signing up for the patient portal called "MyChart".  Sign up information is provided on this After Visit Summary.  MyChart is used to connect with patients for Virtual Visits (Telemedicine).  Patients are able to view lab/test results, encounter notes, upcoming appointments, etc.  Non-urgent messages can be sent to your provider as well.   To learn more about what you can do with MyChart, go to [email or web address].    Your next appointment:   6  month(s)  Provider:   You may see Debbe Odea, MD or one of the following Advanced Practice Providers on your designated Care Team:   Eula Listen, [place]

## 2023-01-11 DIAGNOSIS — M79605 Pain in left leg: Secondary | ICD-10-CM | POA: Diagnosis not present

## 2023-01-11 DIAGNOSIS — M6281 Muscle weakness (generalized): Secondary | ICD-10-CM | POA: Diagnosis not present

## 2023-01-13 DIAGNOSIS — M6281 Muscle weakness (generalized): Secondary | ICD-10-CM | POA: Diagnosis not present

## 2023-01-13 DIAGNOSIS — M79605 Pain in left leg: Secondary | ICD-10-CM | POA: Diagnosis not present

## 2023-01-16 DIAGNOSIS — M79605 Pain in left leg: Secondary | ICD-10-CM | POA: Diagnosis not present

## 2023-01-16 DIAGNOSIS — M6281 Muscle weakness (generalized): Secondary | ICD-10-CM | POA: Diagnosis not present

## 2023-01-18 DIAGNOSIS — M6281 Muscle weakness (generalized): Secondary | ICD-10-CM | POA: Diagnosis not present

## 2023-01-18 DIAGNOSIS — M79605 Pain in left leg: Secondary | ICD-10-CM | POA: Diagnosis not present

## 2023-01-20 DIAGNOSIS — M6281 Muscle weakness (generalized): Secondary | ICD-10-CM | POA: Diagnosis not present

## 2023-01-20 DIAGNOSIS — M79605 Pain in left leg: Secondary | ICD-10-CM | POA: Diagnosis not present

## 2023-01-23 DIAGNOSIS — M6281 Muscle weakness (generalized): Secondary | ICD-10-CM | POA: Diagnosis not present

## 2023-01-23 DIAGNOSIS — M79605 Pain in left leg: Secondary | ICD-10-CM | POA: Diagnosis not present

## 2023-01-25 DIAGNOSIS — M6281 Muscle weakness (generalized): Secondary | ICD-10-CM | POA: Diagnosis not present

## 2023-01-25 DIAGNOSIS — M79605 Pain in left leg: Secondary | ICD-10-CM | POA: Diagnosis not present

## 2023-01-27 DIAGNOSIS — M6281 Muscle weakness (generalized): Secondary | ICD-10-CM | POA: Diagnosis not present

## 2023-01-27 DIAGNOSIS — M79605 Pain in left leg: Secondary | ICD-10-CM | POA: Diagnosis not present

## 2023-01-30 DIAGNOSIS — M79605 Pain in left leg: Secondary | ICD-10-CM | POA: Diagnosis not present

## 2023-01-30 DIAGNOSIS — M6281 Muscle weakness (generalized): Secondary | ICD-10-CM | POA: Diagnosis not present

## 2023-02-01 DIAGNOSIS — Z741 Need for assistance with personal care: Secondary | ICD-10-CM | POA: Diagnosis not present

## 2023-02-01 DIAGNOSIS — R4189 Other symptoms and signs involving cognitive functions and awareness: Secondary | ICD-10-CM | POA: Diagnosis not present

## 2023-02-01 DIAGNOSIS — M6281 Muscle weakness (generalized): Secondary | ICD-10-CM | POA: Diagnosis not present

## 2023-02-01 DIAGNOSIS — G3184 Mild cognitive impairment, so stated: Secondary | ICD-10-CM | POA: Diagnosis not present

## 2023-02-01 DIAGNOSIS — R278 Other lack of coordination: Secondary | ICD-10-CM | POA: Diagnosis not present

## 2023-02-01 DIAGNOSIS — R41841 Cognitive communication deficit: Secondary | ICD-10-CM | POA: Diagnosis not present

## 2023-02-01 DIAGNOSIS — I2510 Atherosclerotic heart disease of native coronary artery without angina pectoris: Secondary | ICD-10-CM | POA: Diagnosis not present

## 2023-02-01 DIAGNOSIS — K589 Irritable bowel syndrome without diarrhea: Secondary | ICD-10-CM | POA: Diagnosis not present

## 2023-02-02 DIAGNOSIS — K589 Irritable bowel syndrome without diarrhea: Secondary | ICD-10-CM | POA: Diagnosis not present

## 2023-02-02 DIAGNOSIS — I2510 Atherosclerotic heart disease of native coronary artery without angina pectoris: Secondary | ICD-10-CM | POA: Diagnosis not present

## 2023-02-02 DIAGNOSIS — R278 Other lack of coordination: Secondary | ICD-10-CM | POA: Diagnosis not present

## 2023-02-02 DIAGNOSIS — Q670 Congenital facial asymmetry: Secondary | ICD-10-CM | POA: Diagnosis not present

## 2023-02-02 DIAGNOSIS — G250 Essential tremor: Secondary | ICD-10-CM | POA: Diagnosis not present

## 2023-02-02 DIAGNOSIS — G3184 Mild cognitive impairment, so stated: Secondary | ICD-10-CM | POA: Diagnosis not present

## 2023-02-02 DIAGNOSIS — Z741 Need for assistance with personal care: Secondary | ICD-10-CM | POA: Diagnosis not present

## 2023-02-02 DIAGNOSIS — R4189 Other symptoms and signs involving cognitive functions and awareness: Secondary | ICD-10-CM | POA: Diagnosis not present

## 2023-02-02 DIAGNOSIS — R001 Bradycardia, unspecified: Secondary | ICD-10-CM | POA: Diagnosis not present

## 2023-02-03 DIAGNOSIS — R4189 Other symptoms and signs involving cognitive functions and awareness: Secondary | ICD-10-CM | POA: Diagnosis not present

## 2023-02-03 DIAGNOSIS — Z741 Need for assistance with personal care: Secondary | ICD-10-CM | POA: Diagnosis not present

## 2023-02-03 DIAGNOSIS — K589 Irritable bowel syndrome without diarrhea: Secondary | ICD-10-CM | POA: Diagnosis not present

## 2023-02-03 DIAGNOSIS — I2510 Atherosclerotic heart disease of native coronary artery without angina pectoris: Secondary | ICD-10-CM | POA: Diagnosis not present

## 2023-02-03 DIAGNOSIS — G3184 Mild cognitive impairment, so stated: Secondary | ICD-10-CM | POA: Diagnosis not present

## 2023-02-03 DIAGNOSIS — R278 Other lack of coordination: Secondary | ICD-10-CM | POA: Diagnosis not present

## 2023-02-06 ENCOUNTER — Other Ambulatory Visit: Payer: Self-pay | Admitting: Student

## 2023-02-06 DIAGNOSIS — R4189 Other symptoms and signs involving cognitive functions and awareness: Secondary | ICD-10-CM | POA: Diagnosis not present

## 2023-02-06 DIAGNOSIS — K589 Irritable bowel syndrome without diarrhea: Secondary | ICD-10-CM | POA: Diagnosis not present

## 2023-02-06 DIAGNOSIS — R278 Other lack of coordination: Secondary | ICD-10-CM | POA: Diagnosis not present

## 2023-02-06 DIAGNOSIS — I2510 Atherosclerotic heart disease of native coronary artery without angina pectoris: Secondary | ICD-10-CM | POA: Diagnosis not present

## 2023-02-06 DIAGNOSIS — G3184 Mild cognitive impairment, so stated: Secondary | ICD-10-CM

## 2023-02-06 DIAGNOSIS — Q670 Congenital facial asymmetry: Secondary | ICD-10-CM

## 2023-02-06 DIAGNOSIS — Z741 Need for assistance with personal care: Secondary | ICD-10-CM | POA: Diagnosis not present

## 2023-02-07 ENCOUNTER — Ambulatory Visit
Admission: RE | Admit: 2023-02-07 | Discharge: 2023-02-07 | Disposition: A | Discharge status: Home / Self Care | Payer: Medicare Other | Source: Ambulatory Visit | Attending: Student | Admitting: Student

## 2023-02-07 DIAGNOSIS — R4189 Other symptoms and signs involving cognitive functions and awareness: Secondary | ICD-10-CM | POA: Diagnosis not present

## 2023-02-07 DIAGNOSIS — R9089 Other abnormal findings on diagnostic imaging of central nervous system: Secondary | ICD-10-CM | POA: Diagnosis not present

## 2023-02-07 DIAGNOSIS — Q670 Congenital facial asymmetry: Secondary | ICD-10-CM | POA: Insufficient documentation

## 2023-02-07 DIAGNOSIS — G3184 Mild cognitive impairment, so stated: Secondary | ICD-10-CM | POA: Insufficient documentation

## 2023-02-09 DIAGNOSIS — R4189 Other symptoms and signs involving cognitive functions and awareness: Secondary | ICD-10-CM | POA: Diagnosis not present

## 2023-02-09 DIAGNOSIS — K589 Irritable bowel syndrome without diarrhea: Secondary | ICD-10-CM | POA: Diagnosis not present

## 2023-02-09 DIAGNOSIS — Z741 Need for assistance with personal care: Secondary | ICD-10-CM | POA: Diagnosis not present

## 2023-02-09 DIAGNOSIS — G3184 Mild cognitive impairment, so stated: Secondary | ICD-10-CM | POA: Diagnosis not present

## 2023-02-09 DIAGNOSIS — R278 Other lack of coordination: Secondary | ICD-10-CM | POA: Diagnosis not present

## 2023-02-09 DIAGNOSIS — I2510 Atherosclerotic heart disease of native coronary artery without angina pectoris: Secondary | ICD-10-CM | POA: Diagnosis not present

## 2023-02-10 DIAGNOSIS — Z741 Need for assistance with personal care: Secondary | ICD-10-CM | POA: Diagnosis not present

## 2023-02-10 DIAGNOSIS — G3184 Mild cognitive impairment, so stated: Secondary | ICD-10-CM | POA: Diagnosis not present

## 2023-02-10 DIAGNOSIS — I2510 Atherosclerotic heart disease of native coronary artery without angina pectoris: Secondary | ICD-10-CM | POA: Diagnosis not present

## 2023-02-10 DIAGNOSIS — R278 Other lack of coordination: Secondary | ICD-10-CM | POA: Diagnosis not present

## 2023-02-10 DIAGNOSIS — R4189 Other symptoms and signs involving cognitive functions and awareness: Secondary | ICD-10-CM | POA: Diagnosis not present

## 2023-02-10 DIAGNOSIS — K589 Irritable bowel syndrome without diarrhea: Secondary | ICD-10-CM | POA: Diagnosis not present

## 2023-02-13 DIAGNOSIS — R278 Other lack of coordination: Secondary | ICD-10-CM | POA: Diagnosis not present

## 2023-02-13 DIAGNOSIS — Z741 Need for assistance with personal care: Secondary | ICD-10-CM | POA: Diagnosis not present

## 2023-02-13 DIAGNOSIS — K589 Irritable bowel syndrome without diarrhea: Secondary | ICD-10-CM | POA: Diagnosis not present

## 2023-02-13 DIAGNOSIS — I2510 Atherosclerotic heart disease of native coronary artery without angina pectoris: Secondary | ICD-10-CM | POA: Diagnosis not present

## 2023-02-13 DIAGNOSIS — G3184 Mild cognitive impairment, so stated: Secondary | ICD-10-CM | POA: Diagnosis not present

## 2023-02-13 DIAGNOSIS — R4189 Other symptoms and signs involving cognitive functions and awareness: Secondary | ICD-10-CM | POA: Diagnosis not present

## 2023-02-15 DIAGNOSIS — R195 Other fecal abnormalities: Secondary | ICD-10-CM | POA: Diagnosis not present

## 2023-02-15 DIAGNOSIS — Q670 Congenital facial asymmetry: Secondary | ICD-10-CM | POA: Diagnosis not present

## 2023-02-15 DIAGNOSIS — G250 Essential tremor: Secondary | ICD-10-CM | POA: Diagnosis not present

## 2023-02-15 DIAGNOSIS — G3184 Mild cognitive impairment, so stated: Secondary | ICD-10-CM | POA: Diagnosis not present

## 2023-02-17 DIAGNOSIS — R278 Other lack of coordination: Secondary | ICD-10-CM | POA: Diagnosis not present

## 2023-02-17 DIAGNOSIS — Z741 Need for assistance with personal care: Secondary | ICD-10-CM | POA: Diagnosis not present

## 2023-02-17 DIAGNOSIS — G3184 Mild cognitive impairment, so stated: Secondary | ICD-10-CM | POA: Diagnosis not present

## 2023-02-17 DIAGNOSIS — K589 Irritable bowel syndrome without diarrhea: Secondary | ICD-10-CM | POA: Diagnosis not present

## 2023-02-17 DIAGNOSIS — I2510 Atherosclerotic heart disease of native coronary artery without angina pectoris: Secondary | ICD-10-CM | POA: Diagnosis not present

## 2023-02-17 DIAGNOSIS — R4189 Other symptoms and signs involving cognitive functions and awareness: Secondary | ICD-10-CM | POA: Diagnosis not present

## 2023-02-20 DIAGNOSIS — R4189 Other symptoms and signs involving cognitive functions and awareness: Secondary | ICD-10-CM | POA: Diagnosis not present

## 2023-02-20 DIAGNOSIS — R278 Other lack of coordination: Secondary | ICD-10-CM | POA: Diagnosis not present

## 2023-02-20 DIAGNOSIS — I2510 Atherosclerotic heart disease of native coronary artery without angina pectoris: Secondary | ICD-10-CM | POA: Diagnosis not present

## 2023-02-20 DIAGNOSIS — Z741 Need for assistance with personal care: Secondary | ICD-10-CM | POA: Diagnosis not present

## 2023-02-20 DIAGNOSIS — K589 Irritable bowel syndrome without diarrhea: Secondary | ICD-10-CM | POA: Diagnosis not present

## 2023-02-20 DIAGNOSIS — G3184 Mild cognitive impairment, so stated: Secondary | ICD-10-CM | POA: Diagnosis not present

## 2023-02-21 ENCOUNTER — Ambulatory Visit: Payer: Medicare Other | Admitting: Internal Medicine

## 2023-02-21 ENCOUNTER — Encounter: Payer: Self-pay | Admitting: Internal Medicine

## 2023-02-21 VITALS — BP 112/70 | HR 80 | Temp 98.0°F | Ht 63.5 in | Wt 146.0 lb

## 2023-02-21 DIAGNOSIS — E039 Hypothyroidism, unspecified: Secondary | ICD-10-CM

## 2023-02-21 DIAGNOSIS — G3184 Mild cognitive impairment, so stated: Secondary | ICD-10-CM | POA: Diagnosis not present

## 2023-02-21 DIAGNOSIS — F39 Unspecified mood [affective] disorder: Secondary | ICD-10-CM

## 2023-02-21 DIAGNOSIS — I480 Paroxysmal atrial fibrillation: Secondary | ICD-10-CM

## 2023-02-21 DIAGNOSIS — I700 Atherosclerosis of aorta: Secondary | ICD-10-CM

## 2023-02-21 DIAGNOSIS — L570 Actinic keratosis: Secondary | ICD-10-CM | POA: Diagnosis not present

## 2023-02-21 DIAGNOSIS — Z0000 Encounter for general adult medical examination without abnormal findings: Secondary | ICD-10-CM

## 2023-02-21 LAB — CBC
HCT: 45.3 % (ref 36.0–46.0)
Hemoglobin: 14.5 g/dL (ref 12.0–15.0)
MCHC: 32 g/dL (ref 30.0–36.0)
MCV: 94.6 fL (ref 78.0–100.0)
Platelets: 319 10*3/uL (ref 150.0–400.0)
RBC: 4.78 Mil/uL (ref 3.87–5.11)
RDW: 14.2 % (ref 11.5–15.5)
WBC: 7.4 10*3/uL (ref 4.0–10.5)

## 2023-02-21 LAB — COMPREHENSIVE METABOLIC PANEL
ALT: 11 U/L (ref 0–35)
AST: 17 U/L (ref 0–37)
Albumin: 4.2 g/dL (ref 3.5–5.2)
Alkaline Phosphatase: 59 U/L (ref 39–117)
BUN: 19 mg/dL (ref 6–23)
CO2: 26 meq/L (ref 19–32)
Calcium: 9.2 mg/dL (ref 8.4–10.5)
Chloride: 103 meq/L (ref 96–112)
Creatinine, Ser: 0.87 mg/dL (ref 0.40–1.20)
GFR: 59.67 mL/min — ABNORMAL LOW (ref >60.00)
Glucose, Bld: 86 mg/dL (ref 70–99)
Potassium: 4.4 meq/L (ref 3.5–5.1)
Sodium: 139 meq/L (ref 135–145)
Total Bilirubin: 0.4 mg/dL (ref 0.2–1.2)
Total Protein: 7.1 g/dL (ref 6.0–8.3)

## 2023-02-21 LAB — T4, FREE: Free T4: 1.01 ng/dL (ref 0.60–1.60)

## 2023-02-21 LAB — TSH: TSH: 11.32 u[IU]/mL — ABNORMAL HIGH (ref 0.35–5.50)

## 2023-02-21 NOTE — Assessment & Plan Note (Signed)
Regular now Continues on amiodarone 200mg  daily and eliquis 5 bid

## 2023-02-21 NOTE — Progress Notes (Signed)
Subjective:    Patient ID: Ariel Phillips, female    DOB: 1932-10-13, 89 y.o.   MRN: [id number]  HPI Here for Medicare wellness visit and follow up of chronic health conditions Reviewed advanced directives Reviewed other doctors---Dr Paich--neurology, Dr Dingledein--ophthal, Dr Agbor-Etang--cardiology, Dr Beckey Rutter, Dr Barbera Setters, [facility] No hospitalizations or surgery in the past year Not really exercising--but occ goes to the programs there Rare wine--- no tobacco No falls No depression or anhedonia Vision is okay---blind in left eye Hearing aides help Ongoing memory issues  Known atrial fib No palpitations  No chest pain or SOB No dizziness or syncope No edema  Now living in assisted living She is here with aide from [facility] Is still independent with ADLs They do medication administration  No heartburn or dysphagia On medication  Current Outpatient Medications on File Prior to Visit  Medication Sig Dispense Refill   amiodarone (PACERONE) 200 MG tablet Take 1 tablet (200 mg total) by mouth daily. TAKE ONE TABLET (200 MG) BY MOUTH EVERY DAY 30 tablet 0   apixaban (ELIQUIS) 5 MG TABS tablet Take 1 tablet (5 mg total) by mouth 2 (two) times daily. 60 tablet 5   cetirizine (ZYRTEC) 10 MG chewable tablet Chew 10 mg by mouth daily.     levothyroxine (SYNTHROID) 75 MCG tablet TAKE 1 TABLET EVERY DAY ON EMPTY STOMACHWITH A GLASS OF WATER AT LEAST 30-60 MINBEFORE BREAKFAST 90 tablet 3   Loratadine 10 MG CAPS Take by mouth.     omeprazole (PRILOSEC) 20 MG capsule Take 20 mg by mouth daily.     No current facility-administered medications on file prior to visit.    Allergies  Allergen Reactions   Grass Pollen(K-O-R-T-Swt Vern) Other (See Comments)   Penicillins Other (See Comments)    Did it involve swelling of the face/tongue/throat, SOB, or low BP? No Did it involve sudden or severe rash/hives, skin peeling, or any reaction on the inside of your  mouth or nose? No Did you need to seek medical attention at a hospital or doctor's office? Yes When did it last happen?      20+ years If all above answers are "NO", may proceed with cephalosporin use.  Pt does not remember the reaction   Latex Rash    Past Medical History:  Diagnosis Date   Allergic asthma    Allergic rhinitis due to pollen    Allergy    GERD (gastroesophageal reflux disease)    IBS (irritable bowel syndrome)    Osteopenia    Unspecified hypothyroidism     Past Surgical History:  Procedure Laterality Date   ANAL FISSURE REPAIR  1950's   BASAL CELL CARCINOMA EXCISION  2000's   nose   CHOLECYSTECTOMY  2008   COLONOSCOPY  Multiple   Negative screening exams   ENDOSCOPIC RETROGRADE CHOLANGIOPANCREATOGRAPHY (ERCP) WITH PROPOFOL N/A 02/13/2019   Procedure: ENDOSCOPIC RETROGRADE CHOLANGIOPANCREATOGRAPHY (ERCP) WITH PROPOFOL;  Surgeon: Iva Boop, MD;  [facility]: [facility];  Service: Endoscopy;  Laterality: N/A;   HEMORRHOID SURGERY  1953 or so   with fissure repair   ORIF ANKLE FRACTURE Right 06/27/2020   Procedure: OPEN REDUCTION INTERNAL FIXATION (ORIF) ANKLE FRACTURE;  Surgeon: Rosetta Posner, DPM;  Location: [facility];  Service: Podiatry;  Laterality: Right;   REMOVAL OF STONES  02/13/2019   Procedure: REMOVAL OF STONES;  Surgeon: Iva Boop, MD;  Location: [facility];  Service: Endoscopy;;   SPHINCTEROTOMY  02/13/2019   Procedure: SPHINCTEROTOMY;  Surgeon:  Iva Boop, MD;  Location: [facility];  Service: Endoscopy;;   TUBAL LIGATION      Family History  Problem Relation Age of Onset   COPD Mother    Cancer Mother        unclear diagnosis   Transient ischemic attack Mother    Diabetes Sister    Hypertension Sister    Stroke Other    Cancer Paternal Grandmother        ?breast   Heart disease Neg Hx     Social History   Socioeconomic History   Marital status: Divorced    Spouse name: Not on file   Number of children: 1    Years of education: Not on file   Highest education level: Not on file  Occupational History   Occupation: Retired historian--consultant  Tobacco Use   Smoking status: Never    Passive exposure: Past   Smokeless tobacco: Never  Vaping Use   Vaping status: Never Used  Substance and Sexual Activity   Alcohol use: Yes    Comment: rare wine   Drug use: No   Sexual activity: Not Currently  Other Topics Concern   Not on file  Social History Narrative   Divorced and retired , moved from [place], [place]   1 Daughter in [place]   Has living will      Daughter has health care POA.---[name]   Requests DNR--order done 06/19/12   Would not want prolonged mechanical ventilation   May accept tube feeds temporarily but not if cognitively unaware   Social Determinants of Health   Financial Resource Strain: Low Risk  (07/14/2021)   Overall Financial Resource Strain (CARDIA)    Difficulty of Paying Living Expenses: Not hard at all  Food Insecurity: No Food Insecurity (03/16/2022)   Hunger Vital Sign    Worried About Running Out of Food in the Last Year: Never true    Ran Out of Food in the Last Year: Never true  Transportation Needs: No Transportation Needs (03/16/2022)   PRAPARE - Administrator, Civil Service (Medical): No    Lack of Transportation (Non-Medical): No  Physical Activity: Not on file  Stress: Not on file  Social Connections: Not on file  Intimate Partner Violence: Not on file   Review of Systems Appetite is good Weight is stable Sleeps well Wears seat belt---doesn't drive anymore Teeth  are okay---keeps up with dentist No sig back or joint pains No suspicious skin lesions---has dried area on right leg Bowels move okay---small pellets though    Objective:   Physical Exam Constitutional:      Appearance: Normal appearance.  HENT:     Mouth/Throat:     Pharynx: No oropharyngeal exudate or posterior oropharyngeal erythema.  Eyes:      Conjunctiva/sclera: Conjunctivae normal.     Comments: Atrophic left eye---exotropia  Cardiovascular:     Rate and Rhythm: Normal rate and regular rhythm.     Pulses: Normal pulses.     Heart sounds: No murmur heard.    No gallop.  Pulmonary:     Effort: Pulmonary effort is normal.     Breath sounds: Normal breath sounds. No wheezing or rales.  Abdominal:     Palpations: Abdomen is soft.     Tenderness: There is no abdominal tenderness.  Musculoskeletal:     Cervical back: Neck supple.     Right lower leg: No edema.     Left lower leg: No edema.  Lymphadenopathy:     Cervical: No cervical adenopathy.  Skin:    Findings: No rash.     Comments: Scaly inflamed 6mm spot on right lower thigh  Neurological:     General: No focal deficit present.     Mental Status: She is alert and oriented to person, place, and time.  Psychiatric:        Mood and Affect: Mood normal.        Behavior: Behavior normal.            Assessment & Plan:

## 2023-02-21 NOTE — Assessment & Plan Note (Signed)
Seems to be euthyroid on levothyroxine Will check labs

## 2023-02-21 NOTE — Assessment & Plan Note (Signed)
Now living in assisted living [facility] neurology

## 2023-02-21 NOTE — Progress Notes (Signed)
Hearing Screening - Comments:: Has hearing aids. Wearing them today. Vision Screening - Comments:: April 2024

## 2023-02-21 NOTE — Assessment & Plan Note (Signed)
Seems to be better with socialization and supervision in [place]

## 2023-02-21 NOTE — Assessment & Plan Note (Signed)
I have personally reviewed the Medicare Annual Wellness questionnaire and have noted 1. The patient's medical and social history 2. Their use of alcohol, tobacco or illicit drugs 3. Their current medications and supplements 4. The patient's functional ability including ADL's, fall risks, home safety risks and hearing or visual             impairment. 5. Diet and physical activities 6. Evidence for depression or mood disorders  The patients weight, height, BMI and visual acuity have been recorded in the chart I have made referrals, counseling and provided education to the patient based review of the above and I have provided the pt with a written personalized care plan for preventive services.  I have provided you with a copy of your personalized plan for preventive services. Please take the time to review along with your updated medication list.  Done with cancer screening Flu/COVID/RSV at [facility]

## 2023-02-21 NOTE — Assessment & Plan Note (Signed)
On imaging No statin with her memory issues

## 2023-02-21 NOTE — Assessment & Plan Note (Signed)
Discussed options--she gave verbal consent Liquid nitrogen 30 seconds x 2  Tolerated well Will have RN at her place monintor

## 2023-02-23 ENCOUNTER — Other Ambulatory Visit: Payer: Self-pay | Admitting: Emergency Medicine

## 2023-02-23 DIAGNOSIS — I480 Paroxysmal atrial fibrillation: Secondary | ICD-10-CM

## 2023-02-23 DIAGNOSIS — E079 Disorder of thyroid, unspecified: Secondary | ICD-10-CM

## 2023-02-27 DIAGNOSIS — R278 Other lack of coordination: Secondary | ICD-10-CM | POA: Diagnosis not present

## 2023-02-27 DIAGNOSIS — Z741 Need for assistance with personal care: Secondary | ICD-10-CM | POA: Diagnosis not present

## 2023-02-27 DIAGNOSIS — K589 Irritable bowel syndrome without diarrhea: Secondary | ICD-10-CM | POA: Diagnosis not present

## 2023-02-27 DIAGNOSIS — G3184 Mild cognitive impairment, so stated: Secondary | ICD-10-CM | POA: Diagnosis not present

## 2023-02-27 DIAGNOSIS — I2510 Atherosclerotic heart disease of native coronary artery without angina pectoris: Secondary | ICD-10-CM | POA: Diagnosis not present

## 2023-02-27 DIAGNOSIS — R4189 Other symptoms and signs involving cognitive functions and awareness: Secondary | ICD-10-CM | POA: Diagnosis not present

## 2023-03-02 DIAGNOSIS — Z741 Need for assistance with personal care: Secondary | ICD-10-CM | POA: Diagnosis not present

## 2023-03-02 DIAGNOSIS — K589 Irritable bowel syndrome without diarrhea: Secondary | ICD-10-CM | POA: Diagnosis not present

## 2023-03-02 DIAGNOSIS — G3184 Mild cognitive impairment, so stated: Secondary | ICD-10-CM | POA: Diagnosis not present

## 2023-03-02 DIAGNOSIS — R278 Other lack of coordination: Secondary | ICD-10-CM | POA: Diagnosis not present

## 2023-03-02 DIAGNOSIS — R4189 Other symptoms and signs involving cognitive functions and awareness: Secondary | ICD-10-CM | POA: Diagnosis not present

## 2023-03-02 DIAGNOSIS — I2510 Atherosclerotic heart disease of native coronary artery without angina pectoris: Secondary | ICD-10-CM | POA: Diagnosis not present

## 2023-03-07 DIAGNOSIS — R278 Other lack of coordination: Secondary | ICD-10-CM | POA: Diagnosis not present

## 2023-03-07 DIAGNOSIS — I2510 Atherosclerotic heart disease of native coronary artery without angina pectoris: Secondary | ICD-10-CM | POA: Diagnosis not present

## 2023-03-07 DIAGNOSIS — R41841 Cognitive communication deficit: Secondary | ICD-10-CM | POA: Diagnosis not present

## 2023-03-07 DIAGNOSIS — G3184 Mild cognitive impairment, so stated: Secondary | ICD-10-CM | POA: Diagnosis not present

## 2023-03-07 DIAGNOSIS — R4189 Other symptoms and signs involving cognitive functions and awareness: Secondary | ICD-10-CM | POA: Diagnosis not present

## 2023-03-07 DIAGNOSIS — Z741 Need for assistance with personal care: Secondary | ICD-10-CM | POA: Diagnosis not present

## 2023-03-07 DIAGNOSIS — K589 Irritable bowel syndrome without diarrhea: Secondary | ICD-10-CM | POA: Diagnosis not present

## 2023-03-08 DIAGNOSIS — R278 Other lack of coordination: Secondary | ICD-10-CM | POA: Diagnosis not present

## 2023-03-08 DIAGNOSIS — K589 Irritable bowel syndrome without diarrhea: Secondary | ICD-10-CM | POA: Diagnosis not present

## 2023-03-08 DIAGNOSIS — Z741 Need for assistance with personal care: Secondary | ICD-10-CM | POA: Diagnosis not present

## 2023-03-08 DIAGNOSIS — I2510 Atherosclerotic heart disease of native coronary artery without angina pectoris: Secondary | ICD-10-CM | POA: Diagnosis not present

## 2023-03-08 DIAGNOSIS — G3184 Mild cognitive impairment, so stated: Secondary | ICD-10-CM | POA: Diagnosis not present

## 2023-03-08 DIAGNOSIS — R4189 Other symptoms and signs involving cognitive functions and awareness: Secondary | ICD-10-CM | POA: Diagnosis not present

## 2023-03-09 DIAGNOSIS — I2510 Atherosclerotic heart disease of native coronary artery without angina pectoris: Secondary | ICD-10-CM | POA: Diagnosis not present

## 2023-03-09 DIAGNOSIS — Z741 Need for assistance with personal care: Secondary | ICD-10-CM | POA: Diagnosis not present

## 2023-03-09 DIAGNOSIS — K589 Irritable bowel syndrome without diarrhea: Secondary | ICD-10-CM | POA: Diagnosis not present

## 2023-03-09 DIAGNOSIS — R278 Other lack of coordination: Secondary | ICD-10-CM | POA: Diagnosis not present

## 2023-03-09 DIAGNOSIS — G3184 Mild cognitive impairment, so stated: Secondary | ICD-10-CM | POA: Diagnosis not present

## 2023-03-09 DIAGNOSIS — R4189 Other symptoms and signs involving cognitive functions and awareness: Secondary | ICD-10-CM | POA: Diagnosis not present

## 2023-03-13 DIAGNOSIS — D485 Neoplasm of uncertain behavior of skin: Secondary | ICD-10-CM | POA: Diagnosis not present

## 2023-03-13 DIAGNOSIS — D225 Melanocytic nevi of trunk: Secondary | ICD-10-CM | POA: Diagnosis not present

## 2023-03-13 DIAGNOSIS — Z08 Encounter for follow-up examination after completed treatment for malignant neoplasm: Secondary | ICD-10-CM | POA: Diagnosis not present

## 2023-03-13 DIAGNOSIS — D2262 Melanocytic nevi of left upper limb, including shoulder: Secondary | ICD-10-CM | POA: Diagnosis not present

## 2023-03-13 DIAGNOSIS — D2271 Melanocytic nevi of right lower limb, including hip: Secondary | ICD-10-CM | POA: Diagnosis not present

## 2023-03-13 DIAGNOSIS — D2272 Melanocytic nevi of left lower limb, including hip: Secondary | ICD-10-CM | POA: Diagnosis not present

## 2023-03-13 DIAGNOSIS — L821 Other seborrheic keratosis: Secondary | ICD-10-CM | POA: Diagnosis not present

## 2023-03-13 DIAGNOSIS — C4401 Basal cell carcinoma of skin of lip: Secondary | ICD-10-CM | POA: Diagnosis not present

## 2023-03-13 DIAGNOSIS — Z85828 Personal history of other malignant neoplasm of skin: Secondary | ICD-10-CM | POA: Diagnosis not present

## 2023-03-13 DIAGNOSIS — D2261 Melanocytic nevi of right upper limb, including shoulder: Secondary | ICD-10-CM | POA: Diagnosis not present

## 2023-03-15 DIAGNOSIS — G3184 Mild cognitive impairment, so stated: Secondary | ICD-10-CM | POA: Diagnosis not present

## 2023-03-15 DIAGNOSIS — K589 Irritable bowel syndrome without diarrhea: Secondary | ICD-10-CM | POA: Diagnosis not present

## 2023-03-15 DIAGNOSIS — Z741 Need for assistance with personal care: Secondary | ICD-10-CM | POA: Diagnosis not present

## 2023-03-15 DIAGNOSIS — R278 Other lack of coordination: Secondary | ICD-10-CM | POA: Diagnosis not present

## 2023-03-15 DIAGNOSIS — I2510 Atherosclerotic heart disease of native coronary artery without angina pectoris: Secondary | ICD-10-CM | POA: Diagnosis not present

## 2023-03-15 DIAGNOSIS — R4189 Other symptoms and signs involving cognitive functions and awareness: Secondary | ICD-10-CM | POA: Diagnosis not present

## 2023-03-16 DIAGNOSIS — Z741 Need for assistance with personal care: Secondary | ICD-10-CM | POA: Diagnosis not present

## 2023-03-16 DIAGNOSIS — K589 Irritable bowel syndrome without diarrhea: Secondary | ICD-10-CM | POA: Diagnosis not present

## 2023-03-16 DIAGNOSIS — R4189 Other symptoms and signs involving cognitive functions and awareness: Secondary | ICD-10-CM | POA: Diagnosis not present

## 2023-03-16 DIAGNOSIS — R278 Other lack of coordination: Secondary | ICD-10-CM | POA: Diagnosis not present

## 2023-03-16 DIAGNOSIS — G3184 Mild cognitive impairment, so stated: Secondary | ICD-10-CM | POA: Diagnosis not present

## 2023-03-16 DIAGNOSIS — I2510 Atherosclerotic heart disease of native coronary artery without angina pectoris: Secondary | ICD-10-CM | POA: Diagnosis not present

## 2023-03-20 ENCOUNTER — Encounter: Payer: Self-pay | Admitting: Student

## 2023-03-20 ENCOUNTER — Non-Acute Institutional Stay: Payer: Medicare Other | Admitting: Student

## 2023-03-20 DIAGNOSIS — E039 Hypothyroidism, unspecified: Secondary | ICD-10-CM | POA: Diagnosis not present

## 2023-03-20 DIAGNOSIS — M8580 Other specified disorders of bone density and structure, unspecified site: Secondary | ICD-10-CM | POA: Diagnosis not present

## 2023-03-20 DIAGNOSIS — D6869 Other thrombophilia: Secondary | ICD-10-CM

## 2023-03-20 DIAGNOSIS — G3184 Mild cognitive impairment, so stated: Secondary | ICD-10-CM | POA: Diagnosis not present

## 2023-03-20 DIAGNOSIS — F39 Unspecified mood [affective] disorder: Secondary | ICD-10-CM

## 2023-03-20 DIAGNOSIS — G250 Essential tremor: Secondary | ICD-10-CM | POA: Diagnosis not present

## 2023-03-20 DIAGNOSIS — I2510 Atherosclerotic heart disease of native coronary artery without angina pectoris: Secondary | ICD-10-CM | POA: Diagnosis not present

## 2023-03-20 DIAGNOSIS — I480 Paroxysmal atrial fibrillation: Secondary | ICD-10-CM

## 2023-03-20 NOTE — Progress Notes (Signed)
Location:  Other   Place of Service:  ALF (13)  Provider: Aija Scarfo  Code Status: DNR Goals of Care:     12/02/2022    1:23 PM  Advanced Directives  Does Patient Have a Medical Advance Directive? Yes  Type of Estate agent of [place];Out of facility DNR (pink MOST or yellow form);Living will  Does patient want to make changes to medical advance directive? No - Patient declined  Copy of Healthcare Power of Attorney in Chart? Yes - validated most recent copy scanned in chart (See row information)     Chief Complaint  Patient presents with   Establish Care    HPI: Patient is a 89 y.o. female seen today for medical management of chronic diseases.   She has an area on her right eye that needs to be removed.   She is eating well. She is having very dark and small stools. She is having a bowel movement every couple of days She has small hard pellets all the time. Previously she had an issue with diarrhea. She is urinating normal   She wears hearing aids as well. She doesn't like showers, she likes bath tubs. OT has helped her with that recently. She has a stool she can use. She showers nightly. She does her own laundry.   She hasn't had any falls this year.   She has lived at [facility] for about 10 years. She lived in [place] first and now she is at [facility]. She is originally from [place]. 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     Past Medical History:  Diagnosis Date   Allergic asthma    Allergic rhinitis due to pollen    Allergy    GERD (gastroesophageal reflux disease)    IBS (irritable bowel syndrome)    Osteopenia    Unspecified hypothyroidism     Past Surgical History:  Procedure Laterality Date   ANAL FISSURE REPAIR  1950's   BASAL CELL CARCINOMA EXCISION  2000's   nose   CHOLECYSTECTOMY  2008   COLONOSCOPY  Multiple   Negative screening exams   ENDOSCOPIC  RETROGRADE CHOLANGIOPANCREATOGRAPHY (ERCP) WITH PROPOFOL N/A 02/13/2019   Procedure: ENDOSCOPIC RETROGRADE CHOLANGIOPANCREATOGRAPHY (ERCP) WITH PROPOFOL;  Surgeon: Iva Boop, MD;  Location: [facility];  Service: Endoscopy;  Laterality: N/A;   HEMORRHOID SURGERY  1953 or so   with fissure repair   ORIF ANKLE FRACTURE Right 06/27/2020   Procedure: OPEN REDUCTION INTERNAL FIXATION (ORIF) ANKLE FRACTURE;  Surgeon: Rosetta Posner, DPM;  Location: [facility];  Service: Podiatry;  Laterality: Right;   REMOVAL OF STONES  02/13/2019   Procedure: REMOVAL OF STONES;  Surgeon: Iva Boop, MD;  [facility]: [facility];  Service: Endoscopy;;   SPHINCTEROTOMY  02/13/2019   Procedure: Dennison Mascot;  Surgeon: Iva Boop, MD;  Location: [facility];  Service: Endoscopy;;   TUBAL LIGATION      Allergies  Allergen Reactions   Grass Pollen(K-O-R-T-Swt Vern) Other (See Comments)   Penicillins Other (See Comments)    Did it involve swelling of the face/tongue/throat, SOB, or low BP? No Did it involve sudden or severe rash/hives, skin peeling, or any reaction on the inside of your mouth or nose? No Did you need to seek medical attention at a hospital or doctor's office? Yes When did it last happen?      20+ years If all above answers are "NO",  may proceed with cephalosporin use.  Pt does not remember the reaction   Latex Rash    Outpatient Encounter Medications as of 03/20/2023  Medication Sig   albuterol (VENTOLIN HFA) 108 (90 Base) MCG/ACT inhaler Inhale into the lungs.   diclofenac Sodium (VOLTAREN) 1 % GEL Apply topically.   amiodarone (PACERONE) 200 MG tablet Take 1 tablet (200 mg total) by mouth daily. TAKE ONE TABLET (200 MG) BY MOUTH EVERY DAY   apixaban (ELIQUIS) 5 MG TABS tablet Take 1 tablet (5 mg total) by mouth 2 (two) times daily.   cetirizine (ZYRTEC) 10 MG chewable tablet Chew 10 mg by mouth daily.   levothyroxine (SYNTHROID) 75 MCG tablet TAKE 1 TABLET EVERY DAY ON EMPTY  STOMACHWITH A GLASS OF WATER AT LEAST 30-60 MINBEFORE BREAKFAST   Loratadine 10 MG CAPS Take by mouth.   omeprazole (PRILOSEC) 20 MG capsule Take 20 mg by mouth daily.   No facility-administered encounter medications on file as of 03/20/2023.    Review of Systems:  Review of Systems  Health Maintenance  Topic Date Due   DEXA SCAN  Never done   COVID-19 Vaccine (6 - 2023-24 season) 01/01/2023   Medicare Annual Wellness (AWV)  02/21/2024   DTaP/Tdap/Td (3 - Td or Tdap) 06/26/2030   Pneumonia Vaccine 38+ Years old  Completed   INFLUENZA VACCINE  Completed   Zoster Vaccines- Shingrix  Completed   HPV VACCINES  Aged Out   MAMMOGRAM  Discontinued    Physical Exam: Vitals:   03/20/23 0957  BP: 137/68  Pulse: (!) 57  Resp: 16  SpO2: 97%  Weight: 145 lb 8 oz (66 kg)   Body mass index is 25.37 kg/m. Physical Exam Constitutional:      Appearance: Normal appearance.  Cardiovascular:     Rate and Rhythm: Normal rate and regular rhythm.     Pulses: Normal pulses.     Heart sounds: Normal heart sounds.  Pulmonary:     Effort: Pulmonary effort is normal.  Abdominal:     General: Abdomen is flat. Bowel sounds are normal.     Palpations: Abdomen is soft.  Musculoskeletal:        General: No swelling or tenderness.  Skin:    General: Skin is warm and dry.     Capillary Refill: Capillary refill takes 2 to 3 seconds.  Neurological:     Mental Status: She is alert and oriented to person, place, and time.     Gait: Gait normal.  Psychiatric:        Mood and Affect: Mood normal.     Labs reviewed: Basic Metabolic Panel: Recent Labs    04/18/22 0854 02/21/23 1230  NA 138 139  K 3.7 4.4  CL 107 103  CO2 23 26  GLUCOSE 99 86  BUN 16 19  CREATININE 0.72 0.87  CALCIUM 8.7* 9.2  TSH  --  11.32*   Liver Function Tests: Recent Labs    04/18/22 0854 02/21/23 1230  AST 22 17  ALT 15 11  ALKPHOS 64 59  BILITOT 1.2 0.4  PROT 7.1 7.1  ALBUMIN 3.9 4.2   No results for  input(s): "LIPASE", "AMYLASE" in the last 8760 hours. No results for input(s): "AMMONIA" in the last 8760 hours. CBC: Recent Labs    04/18/22 0854 02/21/23 1230  WBC 5.5 7.4  HGB 14.8 14.5  HCT 45.6 45.3  MCV 95.0 94.6  PLT 268 319.0   Lipid Panel: No results for input(s): "CHOL", "  HDL", "LDLCALC", "TRIG", "CHOLHDL", "LDLDIRECT" in the last 8760 hours. No results found for: "HGBA1C"  Procedures since last visit: No results found.  Assessment/Plan Benign essential tremor  Acquired thrombophilia (HCC), Chronic  Atherosclerosis of native coronary artery of native heart without angina pectoris  MCI (mild cognitive impairment) - Plan: Do not attempt resuscitation (DNR)  Hypothyroidism, unspecified type  Mood disorder (HCC)  Paroxysmal atrial fibrillation (HCC)  Osteopenia, unspecified location Patient with recent move to assisted living, transitioning well. MCI on most recent testing, reevaluate q6 mo. Independent in ADLs. No longer driving. Weight stable. CBC stable. Tremor well-controlled. Thyroid stable, continue current regimen. Denies signs of bleeding. Continue amiodarone for atrial fibrillation. GERD well-controlled with prilosec.   Labs/tests ordered:  * No order type specified * Next appt:  Visit date not found

## 2023-03-22 DIAGNOSIS — K589 Irritable bowel syndrome without diarrhea: Secondary | ICD-10-CM | POA: Diagnosis not present

## 2023-03-22 DIAGNOSIS — R278 Other lack of coordination: Secondary | ICD-10-CM | POA: Diagnosis not present

## 2023-03-22 DIAGNOSIS — G3184 Mild cognitive impairment, so stated: Secondary | ICD-10-CM | POA: Diagnosis not present

## 2023-03-22 DIAGNOSIS — I2510 Atherosclerotic heart disease of native coronary artery without angina pectoris: Secondary | ICD-10-CM | POA: Diagnosis not present

## 2023-03-22 DIAGNOSIS — Z741 Need for assistance with personal care: Secondary | ICD-10-CM | POA: Diagnosis not present

## 2023-03-22 DIAGNOSIS — R4189 Other symptoms and signs involving cognitive functions and awareness: Secondary | ICD-10-CM | POA: Diagnosis not present

## 2023-03-23 DIAGNOSIS — K589 Irritable bowel syndrome without diarrhea: Secondary | ICD-10-CM | POA: Diagnosis not present

## 2023-03-23 DIAGNOSIS — I2510 Atherosclerotic heart disease of native coronary artery without angina pectoris: Secondary | ICD-10-CM | POA: Diagnosis not present

## 2023-03-23 DIAGNOSIS — R278 Other lack of coordination: Secondary | ICD-10-CM | POA: Diagnosis not present

## 2023-03-23 DIAGNOSIS — G3184 Mild cognitive impairment, so stated: Secondary | ICD-10-CM | POA: Diagnosis not present

## 2023-03-23 DIAGNOSIS — Z741 Need for assistance with personal care: Secondary | ICD-10-CM | POA: Diagnosis not present

## 2023-03-23 DIAGNOSIS — R4189 Other symptoms and signs involving cognitive functions and awareness: Secondary | ICD-10-CM | POA: Diagnosis not present

## 2023-03-27 DIAGNOSIS — K589 Irritable bowel syndrome without diarrhea: Secondary | ICD-10-CM | POA: Diagnosis not present

## 2023-03-27 DIAGNOSIS — Z741 Need for assistance with personal care: Secondary | ICD-10-CM | POA: Diagnosis not present

## 2023-03-27 DIAGNOSIS — I2510 Atherosclerotic heart disease of native coronary artery without angina pectoris: Secondary | ICD-10-CM | POA: Diagnosis not present

## 2023-03-27 DIAGNOSIS — G3184 Mild cognitive impairment, so stated: Secondary | ICD-10-CM | POA: Diagnosis not present

## 2023-03-27 DIAGNOSIS — R278 Other lack of coordination: Secondary | ICD-10-CM | POA: Diagnosis not present

## 2023-03-27 DIAGNOSIS — R4189 Other symptoms and signs involving cognitive functions and awareness: Secondary | ICD-10-CM | POA: Diagnosis not present

## 2023-03-29 ENCOUNTER — Ambulatory Visit: Payer: Medicare Other | Admitting: Cardiology

## 2023-04-06 DIAGNOSIS — R41841 Cognitive communication deficit: Secondary | ICD-10-CM | POA: Diagnosis not present

## 2023-04-06 DIAGNOSIS — G3184 Mild cognitive impairment, so stated: Secondary | ICD-10-CM | POA: Diagnosis not present

## 2023-04-06 DIAGNOSIS — K589 Irritable bowel syndrome without diarrhea: Secondary | ICD-10-CM | POA: Diagnosis not present

## 2023-04-06 DIAGNOSIS — Z741 Need for assistance with personal care: Secondary | ICD-10-CM | POA: Diagnosis not present

## 2023-04-06 DIAGNOSIS — I2510 Atherosclerotic heart disease of native coronary artery without angina pectoris: Secondary | ICD-10-CM | POA: Diagnosis not present

## 2023-04-06 DIAGNOSIS — R4189 Other symptoms and signs involving cognitive functions and awareness: Secondary | ICD-10-CM | POA: Diagnosis not present

## 2023-04-06 DIAGNOSIS — R278 Other lack of coordination: Secondary | ICD-10-CM | POA: Diagnosis not present

## 2023-04-11 DIAGNOSIS — K589 Irritable bowel syndrome without diarrhea: Secondary | ICD-10-CM | POA: Diagnosis not present

## 2023-04-11 DIAGNOSIS — G3184 Mild cognitive impairment, so stated: Secondary | ICD-10-CM | POA: Diagnosis not present

## 2023-04-11 DIAGNOSIS — R4189 Other symptoms and signs involving cognitive functions and awareness: Secondary | ICD-10-CM | POA: Diagnosis not present

## 2023-04-11 DIAGNOSIS — Z741 Need for assistance with personal care: Secondary | ICD-10-CM | POA: Diagnosis not present

## 2023-04-11 DIAGNOSIS — I2510 Atherosclerotic heart disease of native coronary artery without angina pectoris: Secondary | ICD-10-CM | POA: Diagnosis not present

## 2023-04-11 DIAGNOSIS — R278 Other lack of coordination: Secondary | ICD-10-CM | POA: Diagnosis not present

## 2023-04-12 ENCOUNTER — Institutional Professional Consult (permissible substitution): Payer: Medicare Other | Admitting: Cardiology

## 2023-04-18 DIAGNOSIS — I2510 Atherosclerotic heart disease of native coronary artery without angina pectoris: Secondary | ICD-10-CM | POA: Diagnosis not present

## 2023-04-18 DIAGNOSIS — R278 Other lack of coordination: Secondary | ICD-10-CM | POA: Diagnosis not present

## 2023-04-18 DIAGNOSIS — R4189 Other symptoms and signs involving cognitive functions and awareness: Secondary | ICD-10-CM | POA: Diagnosis not present

## 2023-04-18 DIAGNOSIS — Z741 Need for assistance with personal care: Secondary | ICD-10-CM | POA: Diagnosis not present

## 2023-04-18 DIAGNOSIS — G3184 Mild cognitive impairment, so stated: Secondary | ICD-10-CM | POA: Diagnosis not present

## 2023-04-18 DIAGNOSIS — K589 Irritable bowel syndrome without diarrhea: Secondary | ICD-10-CM | POA: Diagnosis not present

## 2023-04-21 DIAGNOSIS — Z741 Need for assistance with personal care: Secondary | ICD-10-CM | POA: Diagnosis not present

## 2023-04-21 DIAGNOSIS — K589 Irritable bowel syndrome without diarrhea: Secondary | ICD-10-CM | POA: Diagnosis not present

## 2023-04-21 DIAGNOSIS — I2510 Atherosclerotic heart disease of native coronary artery without angina pectoris: Secondary | ICD-10-CM | POA: Diagnosis not present

## 2023-04-21 DIAGNOSIS — G3184 Mild cognitive impairment, so stated: Secondary | ICD-10-CM | POA: Diagnosis not present

## 2023-04-21 DIAGNOSIS — R278 Other lack of coordination: Secondary | ICD-10-CM | POA: Diagnosis not present

## 2023-04-21 DIAGNOSIS — R4189 Other symptoms and signs involving cognitive functions and awareness: Secondary | ICD-10-CM | POA: Diagnosis not present

## 2023-05-02 DIAGNOSIS — I2510 Atherosclerotic heart disease of native coronary artery without angina pectoris: Secondary | ICD-10-CM | POA: Diagnosis not present

## 2023-05-02 DIAGNOSIS — K589 Irritable bowel syndrome without diarrhea: Secondary | ICD-10-CM | POA: Diagnosis not present

## 2023-05-02 DIAGNOSIS — G3184 Mild cognitive impairment, so stated: Secondary | ICD-10-CM | POA: Diagnosis not present

## 2023-05-02 DIAGNOSIS — R4189 Other symptoms and signs involving cognitive functions and awareness: Secondary | ICD-10-CM | POA: Diagnosis not present

## 2023-05-02 DIAGNOSIS — Z741 Need for assistance with personal care: Secondary | ICD-10-CM | POA: Diagnosis not present

## 2023-05-02 DIAGNOSIS — R278 Other lack of coordination: Secondary | ICD-10-CM | POA: Diagnosis not present

## 2023-05-11 DIAGNOSIS — K589 Irritable bowel syndrome without diarrhea: Secondary | ICD-10-CM | POA: Diagnosis not present

## 2023-05-11 DIAGNOSIS — R278 Other lack of coordination: Secondary | ICD-10-CM | POA: Diagnosis not present

## 2023-05-11 DIAGNOSIS — I2510 Atherosclerotic heart disease of native coronary artery without angina pectoris: Secondary | ICD-10-CM | POA: Diagnosis not present

## 2023-05-11 DIAGNOSIS — G3184 Mild cognitive impairment, so stated: Secondary | ICD-10-CM | POA: Diagnosis not present

## 2023-05-11 DIAGNOSIS — Z741 Need for assistance with personal care: Secondary | ICD-10-CM | POA: Diagnosis not present

## 2023-05-11 DIAGNOSIS — R4189 Other symptoms and signs involving cognitive functions and awareness: Secondary | ICD-10-CM | POA: Diagnosis not present

## 2023-05-11 DIAGNOSIS — R41841 Cognitive communication deficit: Secondary | ICD-10-CM | POA: Diagnosis not present

## 2023-05-16 DIAGNOSIS — K589 Irritable bowel syndrome without diarrhea: Secondary | ICD-10-CM | POA: Diagnosis not present

## 2023-05-16 DIAGNOSIS — R278 Other lack of coordination: Secondary | ICD-10-CM | POA: Diagnosis not present

## 2023-05-16 DIAGNOSIS — R4189 Other symptoms and signs involving cognitive functions and awareness: Secondary | ICD-10-CM | POA: Diagnosis not present

## 2023-05-16 DIAGNOSIS — Z741 Need for assistance with personal care: Secondary | ICD-10-CM | POA: Diagnosis not present

## 2023-05-16 DIAGNOSIS — G3184 Mild cognitive impairment, so stated: Secondary | ICD-10-CM | POA: Diagnosis not present

## 2023-05-16 DIAGNOSIS — I2510 Atherosclerotic heart disease of native coronary artery without angina pectoris: Secondary | ICD-10-CM | POA: Diagnosis not present

## 2023-05-16 NOTE — Progress Notes (Signed)
  Electrophysiology Office Note:    Date:  05/17/2023   ID:  CYNTHIS PIET, DOB [birth year], MRN [id number]  [facility] Cardiologist:  Constancia Delton, MD  [facility] Electrophysiologist:  Boyce Byes, MD   Referring MD: Roark Chick, PA-C   Chief Complaint: Atrial fibrillation  History of Present Illness:     Ms. Gonzelez is an 89 year old woman who I am seeing today for an evaluation of atrial fibrillation at the request of Varney Gentleman, PA-C.  The patient has a history of nonobstructive coronary artery disease, atrial fibrillation diagnosed in October 2023 and GERD.  She last saw Verdie Gladden in clinic January 09, 2023.  Her A-fib was diagnosed in October 2023 by EMS when she sought care for palpitations.  Amiodarone  was started and she converted to sinus rhythm.  She was started on Eliquis .  She has developed mild cognitive impairment and is moving into an assisted care facility at the time of her last appointment with Verdie Gladden.  Doing well.  She is tolerating her Eliquis  and amiodarone .    Their past medical, social and family history was reviewed.    ROS:   Please see the history of present illness.    All other systems reviewed and are negative.  EKGs/Labs/Other Studies Reviewed:    The following studies were reviewed today:  February 21, 2023 lab work shows a elevated TSH of 11.3, free T4 of 1.01 (normal), normal AST and ALT.  EKG Interpretation Date/Time:  Wednesday May 17 2023 09:32:05 EST Ventricular Rate:  84 PR Interval:  224 QRS Duration:  82 QT Interval:  386 QTC Calculation: 456 R Axis:   23  Text Interpretation: Sinus rhythm with 1st degree A-V block Confirmed by Harvie Liner [phone]) on 05/17/2023 9:40:25 AM    Physical Exam:    VS:  BP 110/60 (BP Location: Left Arm, Patient Position: Sitting, Cuff Size: Normal)   Pulse 84   Ht 5\' 4"  (1.626 m)   Wt 149 lb 8 oz (67.8 kg)   SpO2 97%   BMI 25.66 kg/m     Wt Readings from Last 3 Encounters:   05/17/23 149 lb 8 oz (67.8 kg)  03/20/23 145 lb 8 oz (66 kg)  02/21/23 146 lb (66.2 kg)     GEN: no distress.  Elderly CARD: RRR, No MRG RESP: No IWOB. CTAB.        ASSESSMENT AND PLAN:    1. Persistent atrial fibrillation (HCC)   2. Encounter for long-term (current) use of high-risk medication   3. Coronary artery disease, non-occlusive     #Persistent atrial fibrillation #High risk med monitoring-amiodarone  Maintaining sinus rhythm on amiodarone .  This recent blood work showed an elevated TSH with a normal free T4.  I like to repeat her CMP and TSH and free T4 today to see if these have stabilized. She is currently taking 75 mcg of Synthroid  prescribed her primary care physician  Continue Eliquis  for stroke prophylaxis.  #Coronary artery disease No ischemic symptoms today.  Follow-up 6 months with an EP APP.    Signed, Leanora Prophet. Marven Slimmer, MD, [facility], [facility] 05/17/2023 9:40 AM    Electrophysiology [facility]

## 2023-05-17 ENCOUNTER — Ambulatory Visit: Payer: Medicare Other | Attending: Cardiology | Admitting: Cardiology

## 2023-05-17 ENCOUNTER — Other Ambulatory Visit: Payer: Self-pay

## 2023-05-17 ENCOUNTER — Encounter: Payer: Self-pay | Admitting: Cardiology

## 2023-05-17 VITALS — BP 110/60 | HR 84 | Ht 64.0 in | Wt 149.5 lb

## 2023-05-17 DIAGNOSIS — I4819 Other persistent atrial fibrillation: Secondary | ICD-10-CM | POA: Diagnosis not present

## 2023-05-17 DIAGNOSIS — I2510 Atherosclerotic heart disease of native coronary artery without angina pectoris: Secondary | ICD-10-CM | POA: Diagnosis not present

## 2023-05-17 DIAGNOSIS — Z79899 Other long term (current) drug therapy: Secondary | ICD-10-CM | POA: Insufficient documentation

## 2023-05-17 NOTE — Patient Instructions (Signed)
 Medication Instructions:  Your physician recommends that you continue on your current medications as directed. Please refer to the Current Medication list given to you today.  *If you need a refill on your cardiac medications before your next appointment, please call your pharmacy*   Lab Work: TODAY: CMET, TSH, T4 If you have labs (blood work) drawn today and your tests are completely normal, you will receive your results only by: MyChart Message (if you have MyChart) OR A paper copy in the mail If you have any lab test that is abnormal or we need to change your treatment, we will call you to review the results.   Follow-Up: At [facility], you and your health needs are our priority.  As part of our continuing mission to provide you with exceptional heart care, we have created designated Provider Care Teams.  These Care Teams include your primary Cardiologist (physician) and Advanced Practice Providers (APPs -  Physician Assistants and Nurse Practitioners) who all work together to provide you with the care you need, when you need it.   Your next appointment:   6 months  Provider:   Sherie Don, NP

## 2023-05-18 DIAGNOSIS — I2510 Atherosclerotic heart disease of native coronary artery without angina pectoris: Secondary | ICD-10-CM | POA: Diagnosis not present

## 2023-05-18 DIAGNOSIS — R278 Other lack of coordination: Secondary | ICD-10-CM | POA: Diagnosis not present

## 2023-05-18 DIAGNOSIS — R4189 Other symptoms and signs involving cognitive functions and awareness: Secondary | ICD-10-CM | POA: Diagnosis not present

## 2023-05-18 DIAGNOSIS — G3184 Mild cognitive impairment, so stated: Secondary | ICD-10-CM | POA: Diagnosis not present

## 2023-05-18 DIAGNOSIS — Z741 Need for assistance with personal care: Secondary | ICD-10-CM | POA: Diagnosis not present

## 2023-05-18 DIAGNOSIS — K589 Irritable bowel syndrome without diarrhea: Secondary | ICD-10-CM | POA: Diagnosis not present

## 2023-05-18 LAB — COMPREHENSIVE METABOLIC PANEL
ALT: 11 [IU]/L (ref 0–32)
AST: 19 [IU]/L (ref 0–40)
Albumin: 4.2 g/dL (ref 3.7–4.7)
Alkaline Phosphatase: 67 [IU]/L (ref 44–121)
BUN/Creatinine Ratio: 18 (ref 12–28)
BUN: 17 mg/dL (ref 8–27)
Bilirubin Total: 0.3 mg/dL (ref 0.0–1.2)
CO2: 21 mmol/L (ref 20–29)
Calcium: 9.1 mg/dL (ref 8.7–10.3)
Chloride: 102 mmol/L (ref 96–106)
Creatinine, Ser: 0.94 mg/dL (ref 0.57–1.00)
Globulin, Total: 2.5 g/dL (ref 1.5–4.5)
Glucose: 77 mg/dL (ref 70–99)
Potassium: 4.6 mmol/L (ref 3.5–5.2)
Sodium: 138 mmol/L (ref 134–144)
Total Protein: 6.7 g/dL (ref 6.0–8.5)
eGFR: 58 mL/min/{1.73_m2} — ABNORMAL LOW (ref >59)

## 2023-05-18 LAB — T4, FREE: Free T4: 1.1 ng/dL (ref 0.82–1.77)

## 2023-05-18 LAB — TSH: TSH: 23.1 u[IU]/mL — ABNORMAL HIGH (ref 0.450–4.500)

## 2023-05-21 ENCOUNTER — Encounter: Payer: Self-pay | Admitting: Cardiology

## 2023-05-22 ENCOUNTER — Telehealth: Payer: Self-pay

## 2023-05-22 NOTE — Telephone Encounter (Signed)
-----   Message from Tillman Abide sent at 05/19/2023  2:10 PM EST ----- FYI--now under Dr Orene Desanctis care ----- Message ----- From: Alois Cliche, RN Sent: 05/18/2023   4:52 PM EST To: Karie Schwalbe, MD  Results reviewed by triage and sent to provider's basket and PCP.

## 2023-05-22 NOTE — Telephone Encounter (Signed)
Spoke with pt and advised to schedule a follow up appointment with Dr Orene Desanctis office for elevated TSH.  Pt verbalizes understanding and agrees with current plan.    TSH elevated with a normal FT4. Can we make sure she has follow up with Dr Sydnee Cabal in the next few weeks? Thanks!   Sheria Lang T. Lalla Brothers, MD, [facility], [facility] Cardiac Electrophysiology

## 2023-05-25 DIAGNOSIS — R278 Other lack of coordination: Secondary | ICD-10-CM | POA: Diagnosis not present

## 2023-05-25 DIAGNOSIS — K589 Irritable bowel syndrome without diarrhea: Secondary | ICD-10-CM | POA: Diagnosis not present

## 2023-05-25 DIAGNOSIS — R4189 Other symptoms and signs involving cognitive functions and awareness: Secondary | ICD-10-CM | POA: Diagnosis not present

## 2023-05-25 DIAGNOSIS — Z741 Need for assistance with personal care: Secondary | ICD-10-CM | POA: Diagnosis not present

## 2023-05-25 DIAGNOSIS — I2510 Atherosclerotic heart disease of native coronary artery without angina pectoris: Secondary | ICD-10-CM | POA: Diagnosis not present

## 2023-05-25 DIAGNOSIS — G3184 Mild cognitive impairment, so stated: Secondary | ICD-10-CM | POA: Diagnosis not present

## 2023-05-29 ENCOUNTER — Non-Acute Institutional Stay: Payer: Medicare Other | Admitting: Student

## 2023-05-29 DIAGNOSIS — L821 Other seborrheic keratosis: Secondary | ICD-10-CM

## 2023-05-29 DIAGNOSIS — K529 Noninfective gastroenteritis and colitis, unspecified: Secondary | ICD-10-CM

## 2023-05-29 DIAGNOSIS — E039 Hypothyroidism, unspecified: Secondary | ICD-10-CM | POA: Diagnosis not present

## 2023-05-30 DIAGNOSIS — I2510 Atherosclerotic heart disease of native coronary artery without angina pectoris: Secondary | ICD-10-CM | POA: Diagnosis not present

## 2023-05-30 DIAGNOSIS — R278 Other lack of coordination: Secondary | ICD-10-CM | POA: Diagnosis not present

## 2023-05-30 DIAGNOSIS — K589 Irritable bowel syndrome without diarrhea: Secondary | ICD-10-CM | POA: Diagnosis not present

## 2023-05-30 DIAGNOSIS — G3184 Mild cognitive impairment, so stated: Secondary | ICD-10-CM | POA: Diagnosis not present

## 2023-05-30 DIAGNOSIS — R4189 Other symptoms and signs involving cognitive functions and awareness: Secondary | ICD-10-CM | POA: Diagnosis not present

## 2023-05-30 DIAGNOSIS — Z741 Need for assistance with personal care: Secondary | ICD-10-CM | POA: Diagnosis not present

## 2023-05-31 ENCOUNTER — Encounter: Payer: Self-pay | Admitting: Student

## 2023-05-31 NOTE — Progress Notes (Signed)
Location:  Other   Place of Service:  ALF (13) Provider: Judeth Horn, MD  Patient Care Team: Earnestine Mealing, MD as PCP - General (Family Medicine) Debbe Odea, MD as PCP - Cardiology (Cardiology) Lanier Prude, MD as PCP - Electrophysiology (Cardiology) Kathyrn Sheriff, [facility] (Inactive) as Pharmacist (Pharmacist)  Extended Emergency Contact Information Primary Emergency Contact: Vella Redhead, [place] Home Phone: [phone] Work Phone: [phone] Mobile Phone: [phone] Relation: Daughter  Code Status:  DNR Goals of care: Advanced Directive information    12/02/2022    1:23 PM  Advanced Directives  Does Patient Have a Medical Advance Directive? Yes  Type of Estate agent of [place];Out of facility DNR (pink MOST or yellow form);Living will  Does patient want to make changes to medical advance directive? No - Patient declined  Copy of Healthcare Power of Attorney in Chart? Yes - validated most recent copy scanned in chart (See row information)     Chief Complaint  Patient presents with   Abnormal TSH    HPI:  Pt is a 89 y.o. female seen today for medical management of chronic diseases.   History of Present Illness The patient, with a history of cardiac disease and thyroid dysfunction, was seen for a follow-up visit after a recent cardiology consultation. She was asymptomatic, denying any palpitations or recurrence of previous atrial fibrillation. However, she reported a persistent issue with diarrhea and very dark stools for the past month. The patient also noted that despite taking Pepto Bismol, the diarrhea was not fully controlled, necessitating the use of diapers. She denied any associated vomiting or abdominal pain. The patient also reported dry skin but denied using any lotion for it. The patient's bowel sounds were active but not hyperactive on examination. The patient's  heart rate was regular.   Past Medical History:  Diagnosis Date   Allergic asthma    Allergic rhinitis due to pollen    Allergy    GERD (gastroesophageal reflux disease)    IBS (irritable bowel syndrome)    Osteopenia    Unspecified hypothyroidism    Past Surgical History:  Procedure Laterality Date   ANAL FISSURE REPAIR  1950's   BASAL CELL CARCINOMA EXCISION  2000's   nose   CHOLECYSTECTOMY  2008   COLONOSCOPY  Multiple   Negative screening exams   ENDOSCOPIC RETROGRADE CHOLANGIOPANCREATOGRAPHY (ERCP) WITH PROPOFOL N/A 02/13/2019   Procedure: ENDOSCOPIC RETROGRADE CHOLANGIOPANCREATOGRAPHY (ERCP) WITH PROPOFOL;  Surgeon: Iva Boop, MD;  [facility];  Service: Endoscopy;  Laterality: N/A;   HEMORRHOID SURGERY  1953 or so   with fissure repair   ORIF ANKLE FRACTURE Right 06/27/2020   Procedure: OPEN REDUCTION INTERNAL FIXATION (ORIF) ANKLE FRACTURE;  Surgeon: Rosetta Posner, DPM;  Location: [facility];  Service: Podiatry;  Laterality: Right;   REMOVAL OF STONES  02/13/2019   Procedure: REMOVAL OF STONES;  Surgeon: Iva Boop, MD;  [facility]: [facility];  Service: Endoscopy;;   SPHINCTEROTOMY  02/13/2019   Procedure: Dennison Mascot;  Surgeon: Iva Boop, MD;  [facility];  Service: Endoscopy;;   TUBAL LIGATION      Allergies  Allergen Reactions   Grass Pollen(K-O-R-T-Swt Vern) Other (See Comments)   Penicillins Other (See Comments)    Did it involve swelling of the face/tongue/throat, SOB, or low BP? No Did it involve sudden or severe rash/hives, skin peeling, or any reaction on the inside of  your mouth or nose? No Did you need to seek medical attention at a hospital or doctor's office? Yes When did it last happen?      20+ years If all above answers are "NO", may proceed with cephalosporin use.  Pt does not remember the reaction   Latex Rash    Outpatient Encounter Medications as of 05/29/2023  Medication Sig   albuterol (VENTOLIN  HFA) 108 (90 Base) MCG/ACT inhaler Inhale into the lungs.   amiodarone (PACERONE) 200 MG tablet Take 1 tablet (200 mg total) by mouth daily. TAKE ONE TABLET (200 MG) BY MOUTH EVERY DAY   apixaban (ELIQUIS) 5 MG TABS tablet Take 1 tablet (5 mg total) by mouth 2 (two) times daily.   cetirizine (ZYRTEC) 10 MG chewable tablet Chew 10 mg by mouth daily.   diclofenac Sodium (VOLTAREN) 1 % GEL Apply topically.   levothyroxine (SYNTHROID) 75 MCG tablet TAKE 1 TABLET EVERY DAY ON EMPTY STOMACHWITH A GLASS OF WATER AT LEAST 30-60 MINBEFORE BREAKFAST   Loratadine 10 MG CAPS Take by mouth.   omeprazole (PRILOSEC) 20 MG capsule Take 20 mg by mouth daily.   No facility-administered encounter medications on file as of 05/29/2023.    Review of Systems  Immunization History  Administered Date(s) Administered   Fluad Quad(high Dose 65+) 01/14/2019, 02/04/2020, 02/05/2021   Influenza Split 01/12/2011, 02/18/2012, 02/08/2013   Influenza, High Dose Seasonal PF 03/03/2014   Influenza,inj,Quad PF,6+ Mos 12/22/2014, 12/25/2015, 12/30/2016, 01/30/2018   Influenza-Unspecified 02/15/2022, 01/31/2023   Moderna Sars-Covid-2 Vaccination 05/16/2019, 06/13/2019, 03/13/2020, 09/17/2020   Pfizer Covid-19 Vaccine Bivalent Booster 58yrs & up 01/21/2021   Pneumococcal Conjugate-13 12/19/2013   Pneumococcal Polysaccharide-23 12/30/2016   Pneumococcal-Unspecified 05/02/2006   Td 02/16/2011   Tdap 06/26/2020   Zoster Recombinant(Shingrix) 01/12/2018, 03/31/2018   Zoster, Live 06/11/2009   Pertinent  Health Maintenance Due  Topic Date Due   DEXA SCAN  Never done   INFLUENZA VACCINE  Completed   MAMMOGRAM  Discontinued      02/05/2021   11:04 AM 02/11/2022   10:15 AM 03/13/2022    5:14 PM 04/18/2022    9:07 AM 02/21/2023   11:44 AM  Fall Risk  Falls in the past year? 1    0  Was there an injury with Fall? 1    0  Fall Risk Category Calculator 2    0  Fall Risk Category (Retired) Moderate      (RETIRED) Patient  Fall Risk Level  Moderate fall risk Low fall risk Low fall risk   Patient at Risk for Falls Due to     No Fall Risks  Fall risk Follow up Falls prevention discussed    Falls evaluation completed   Functional Status Survey:    Vitals:   05/29/23 2252  BP: 112/67  Pulse: 73  Resp: 17  Temp: 98.3 F (36.8 C)  SpO2: 95%  Weight: 148 lb 1.6 oz (67.2 kg)   Body mass index is 25.42 kg/m. Physical Exam  Physical Exam CARDIOVASCULAR: Heart rate regular rate. ABDOMEN: Bowel sounds active but not hyperactive. No abdominal tenderness. SKIN: Presence of seborrheic keratosis, described as dry.  Labs reviewed: Recent Labs    02/21/23 1230 05/17/23 1000  NA 139 138  K 4.4 4.6  CL 103 102  CO2 26 21  GLUCOSE 86 77  BUN 19 17  CREATININE 0.87 0.94  CALCIUM 9.2 9.1   Recent Labs    02/21/23 1230 05/17/23 1000  AST 17 19  ALT  11 11  ALKPHOS 59 67  BILITOT 0.4 0.3  PROT 7.1 6.7  ALBUMIN 4.2 4.2   Recent Labs    02/21/23 1230  WBC 7.4  HGB 14.5  HCT 45.3  MCV 94.6  PLT 319.0   Lab Results  Component Value Date   TSH 23.100 (H) 05/17/2023   No results found for: "HGBA1C" Lab Results  Component Value Date   CHOL 172 02/04/2020   HDL 59.60 02/04/2020   LDLCALC 96 02/04/2020   TRIG 83.0 02/04/2020   CHOLHDL 3 02/04/2020    Significant Diagnostic Results in last 30 days:  No results found.  Assessment/Plan HYPOTHYROID tsh levels have increased significantly since the last check. No symptoms of palpitations or tachycardia. Atrial fibrillation is well-managed with no recent episodes. Thyroid medication adjusted accordingly. Recheck thyroid levels in six weeks to monitor effectiveness. - Recheck tsh levels in six weeks - Adjust thyroid medication dosage  Chronic Diarrhea Persistent diarrhea for about a month with melena. No vomiting. IBS with diarrhea-predominant symptoms. Pepto Bismol used without significant improvement; melena present before starting Pepto  Bismol. No medications contributing to diarrhea. Discussed hyperthyroidism as a potential contributing factor. Need to test for blood in stool and possible infection. - Perform FIT test for blood in stool - Test for possible infection - Encourage increased fluid intake to prevent dehydration  Seborrheic Keratosis Identified as seborrheic keratosis. No treatment necessary. Advised against picking at the lesion to prevent growth. - No treatment required - Advise not to pick at the lesion  General Health Maintenance Advised to use lotion for dry skin and increase fluid intake due to diarrhea. - Encourage use of lotion for dry skin - Encourage increased fluid intake  Follow-up - Follow up with cardiologist - Recheck thyroid levels in six weeks.  Family/ staff Communication: nursing  Labs/tests ordered:  TSH in 6Wks

## 2023-06-20 DIAGNOSIS — R4189 Other symptoms and signs involving cognitive functions and awareness: Secondary | ICD-10-CM | POA: Diagnosis not present

## 2023-06-20 DIAGNOSIS — G3184 Mild cognitive impairment, so stated: Secondary | ICD-10-CM | POA: Diagnosis not present

## 2023-06-20 DIAGNOSIS — Z741 Need for assistance with personal care: Secondary | ICD-10-CM | POA: Diagnosis not present

## 2023-06-20 DIAGNOSIS — R41841 Cognitive communication deficit: Secondary | ICD-10-CM | POA: Diagnosis not present

## 2023-06-20 DIAGNOSIS — R278 Other lack of coordination: Secondary | ICD-10-CM | POA: Diagnosis not present

## 2023-06-20 DIAGNOSIS — I2510 Atherosclerotic heart disease of native coronary artery without angina pectoris: Secondary | ICD-10-CM | POA: Diagnosis not present

## 2023-06-20 DIAGNOSIS — K589 Irritable bowel syndrome without diarrhea: Secondary | ICD-10-CM | POA: Diagnosis not present

## 2023-07-03 DIAGNOSIS — L578 Other skin changes due to chronic exposure to nonionizing radiation: Secondary | ICD-10-CM | POA: Diagnosis not present

## 2023-07-03 DIAGNOSIS — L814 Other melanin hyperpigmentation: Secondary | ICD-10-CM | POA: Diagnosis not present

## 2023-07-03 DIAGNOSIS — C4401 Basal cell carcinoma of skin of lip: Secondary | ICD-10-CM | POA: Diagnosis not present

## 2023-07-03 DIAGNOSIS — E039 Hypothyroidism, unspecified: Secondary | ICD-10-CM | POA: Diagnosis not present

## 2023-07-03 DIAGNOSIS — L988 Other specified disorders of the skin and subcutaneous tissue: Secondary | ICD-10-CM | POA: Diagnosis not present

## 2023-07-03 LAB — TSH: TSH: 14.54 — AB (ref 0.41–5.90)

## 2023-07-17 DIAGNOSIS — R001 Bradycardia, unspecified: Secondary | ICD-10-CM | POA: Diagnosis not present

## 2023-07-17 DIAGNOSIS — G3184 Mild cognitive impairment, so stated: Secondary | ICD-10-CM | POA: Diagnosis not present

## 2023-07-17 DIAGNOSIS — R195 Other fecal abnormalities: Secondary | ICD-10-CM | POA: Diagnosis not present

## 2023-07-17 DIAGNOSIS — G250 Essential tremor: Secondary | ICD-10-CM | POA: Diagnosis not present

## 2023-08-24 ENCOUNTER — Non-Acute Institutional Stay: Payer: Self-pay | Admitting: Nurse Practitioner

## 2023-08-24 DIAGNOSIS — I480 Paroxysmal atrial fibrillation: Secondary | ICD-10-CM

## 2023-08-24 DIAGNOSIS — I700 Atherosclerosis of aorta: Secondary | ICD-10-CM

## 2023-08-24 DIAGNOSIS — M8580 Other specified disorders of bone density and structure, unspecified site: Secondary | ICD-10-CM | POA: Diagnosis not present

## 2023-08-24 DIAGNOSIS — I2510 Atherosclerotic heart disease of native coronary artery without angina pectoris: Secondary | ICD-10-CM | POA: Diagnosis not present

## 2023-08-24 DIAGNOSIS — G3184 Mild cognitive impairment, so stated: Secondary | ICD-10-CM

## 2023-08-24 DIAGNOSIS — D6869 Other thrombophilia: Secondary | ICD-10-CM

## 2023-08-24 DIAGNOSIS — R195 Other fecal abnormalities: Secondary | ICD-10-CM

## 2023-08-24 DIAGNOSIS — K580 Irritable bowel syndrome with diarrhea: Secondary | ICD-10-CM

## 2023-08-24 DIAGNOSIS — E2839 Other primary ovarian failure: Secondary | ICD-10-CM

## 2023-08-24 DIAGNOSIS — K219 Gastro-esophageal reflux disease without esophagitis: Secondary | ICD-10-CM | POA: Diagnosis not present

## 2023-08-24 DIAGNOSIS — E039 Hypothyroidism, unspecified: Secondary | ICD-10-CM | POA: Diagnosis not present

## 2023-08-24 DIAGNOSIS — L570 Actinic keratosis: Secondary | ICD-10-CM | POA: Diagnosis not present

## 2023-08-24 DIAGNOSIS — J309 Allergic rhinitis, unspecified: Secondary | ICD-10-CM

## 2023-08-24 MED ORDER — LEVOTHYROXINE SODIUM 112 MCG PO TABS: 112.0000 ug | ORAL_TABLET | Freq: Every day | ORAL | Status: AC

## 2023-08-24 NOTE — Assessment & Plan Note (Signed)
 Rate controlled. Continues on amiodarone.

## 2023-08-24 NOTE — Assessment & Plan Note (Signed)
 Followed by dermatology

## 2023-08-24 NOTE — Assessment & Plan Note (Signed)
 Controlled on omeprazole.

## 2023-08-24 NOTE — Assessment & Plan Note (Signed)
 TSH continues to be elevated, will increase synthroid  to 112 mcg at this time- currently she is on 100 mcg.

## 2023-08-24 NOTE — Assessment & Plan Note (Signed)
 Due to a fib, continues on eliquis  BID

## 2023-08-24 NOTE — Assessment & Plan Note (Signed)
 Ongoing, will add benefiber to regimen daily

## 2023-08-24 NOTE — Assessment & Plan Note (Signed)
 Staff noting more memory decline. Will continue to monitor.

## 2023-08-24 NOTE — Assessment & Plan Note (Signed)
 Stable, without reports of chest pains

## 2023-08-24 NOTE — Assessment & Plan Note (Signed)
 Stable at this time, continues on zyrtec.

## 2023-08-24 NOTE — Progress Notes (Signed)
 Location:   [facility]   Place of Service:   [facility]   Valrie Gehrig, MD  Patient Care Team: Valrie Gehrig, MD as PCP - General (Family Medicine) Constancia Delton, MD as PCP - Cardiology (Cardiology) Boyce Byes, MD as PCP - Electrophysiology (Cardiology) Jonathan Neighbor, [facility] (Inactive) as Pharmacist (Pharmacist)  Extended Emergency Contact Information Primary Emergency Contact: Jonda Neighbours, [place] Home Phone: [phone] Work Phone: [phone] Mobile Phone: [phone] Relation: Daughter  Goals of care: Advanced Directive information    12/02/2022    1:23 PM  Advanced Directives  Does Patient Have a Medical Advance Directive? Yes  Type of Estate agent of [place];Out of facility DNR (pink MOST or yellow form);Living will  Does patient want to make changes to medical advance directive? No - Patient declined  Copy of Healthcare Power of Attorney in Chart? Yes - validated most recent copy scanned in chart (See row information)     Chief Complaint  Patient presents with   Medical Management of Chronic Issues   Discussed the use of AI scribe software for clinical note transcription with the patient, who gave verbal consent to proceed.  HPI:  Pt is a 89 y.o. female seen today for medical management of chronic disease.  [facility] at [facility] She has been living in [place] for 8 months and gets medication management from staff.   She has been experiencing very dark stools for a while, which is concerning due to her use of Eliquis  for atrial fibrillation.   She reports having diarrhea consistently. She is not on any fiber supplements but uses Miralax, which helps regulate her bowel movements, although the stools remain loose and dark. She is not taking any specific medication for diarrhea.  She is on Synthroid  for thyroid  management. Her TSH levels  were previously high at 23 but have improved to 14. She is currently on a dose of 100 mcg of Synthroid .   She also takes omeprazole  for indigestion, although she reports no current symptoms of acid reflux or indigestion.   Additionally, she takes Zyrtec daily for allergies, which are worse in the fall but currently under control.  For atrial fibrillation, she is on amiodarone  and Eliquis . No palpitations, chest pain, or abnormal heartbeats are reported.  She mentions feeling tired and sleeping a lot but denies feeling down, depressed, or hopeless. She sleeps well at night    Past Medical History:  Diagnosis Date   Allergic asthma    Allergic rhinitis due to pollen    Allergy    GERD (gastroesophageal reflux disease)    IBS (irritable bowel syndrome)    Osteopenia    Unspecified hypothyroidism    Past Surgical History:  Procedure Laterality Date   ANAL FISSURE REPAIR  1950's   BASAL CELL CARCINOMA EXCISION  2000's   nose   CHOLECYSTECTOMY  2008   COLONOSCOPY  Multiple   Negative screening exams   ENDOSCOPIC RETROGRADE CHOLANGIOPANCREATOGRAPHY (ERCP) WITH PROPOFOL  N/A 02/13/2019   Procedure: ENDOSCOPIC RETROGRADE CHOLANGIOPANCREATOGRAPHY (ERCP) WITH PROPOFOL ;  Surgeon: Kenney Peacemaker, MD;  Location: [facility];  Service: Endoscopy;  Laterality: N/A;   HEMORRHOID SURGERY  1953 or so   with fissure repair   ORIF ANKLE FRACTURE Right 06/27/2020   Procedure: OPEN REDUCTION INTERNAL FIXATION (ORIF) ANKLE FRACTURE;  Surgeon: Pink Bridges, DPM;  Location: [facility];  Service: Podiatry;  Laterality: Right;   REMOVAL OF STONES  02/13/2019   Procedure: REMOVAL OF STONES;  Surgeon: Kenney Peacemaker, MD;  [facility]: [facility];  Service: Endoscopy;;   SPHINCTEROTOMY  02/13/2019   Procedure: [address];  Surgeon: Kenney Peacemaker, MD;  [facility];  Service: Endoscopy;;   TUBAL LIGATION      Allergies  Allergen Reactions   Grass Pollen(K-O-R-T-Swt Vern) Other (See  Comments)   Penicillins Other (See Comments)    Did it involve swelling of the face/tongue/throat, SOB, or low BP? No Did it involve sudden or severe rash/hives, skin peeling, or any reaction on the inside of your mouth or nose? No Did you need to seek medical attention at a hospital or doctor's office? Yes When did it last happen?      20+ years If all above answers are "NO", may proceed with cephalosporin use.  Pt does not remember the reaction   Latex Rash    Outpatient Encounter Medications as of 08/24/2023  Medication Sig   albuterol  (VENTOLIN  HFA) 108 (90 Base) MCG/ACT inhaler Inhale into the lungs.   amiodarone  (PACERONE ) 200 MG tablet Take 1 tablet (200 mg total) by mouth daily. TAKE ONE TABLET (200 MG) BY MOUTH EVERY DAY   apixaban  (ELIQUIS ) 5 MG TABS tablet Take 1 tablet (5 mg total) by mouth 2 (two) times daily.   cetirizine (ZYRTEC) 10 MG chewable tablet Chew 10 mg by mouth daily.   diclofenac  Sodium (VOLTAREN ) 1 % GEL Apply topically.   levothyroxine  (SYNTHROID ) 75 MCG tablet TAKE 1 TABLET EVERY DAY ON EMPTY STOMACHWITH A GLASS OF WATER AT LEAST 30-60 MINBEFORE BREAKFAST   Loratadine 10 MG CAPS Take by mouth.   omeprazole  (PRILOSEC) 20 MG capsule Take 20 mg by mouth daily.   No facility-administered encounter medications on file as of 08/24/2023.    Review of Systems  Constitutional:  Negative for activity change, appetite change, fatigue and unexpected weight change.  HENT:  Negative for congestion and hearing loss.   Eyes: Negative.   Respiratory:  Negative for cough and shortness of breath.   Cardiovascular:  Negative for chest pain, palpitations and leg swelling.  Gastrointestinal:  Positive for diarrhea. Negative for abdominal pain and constipation.       Reports dark stools  Genitourinary:  Negative for difficulty urinating and dysuria.  Musculoskeletal:  Negative for arthralgias and myalgias.  Skin:  Negative for color change and wound.  Neurological:  Negative  for dizziness and weakness.  Psychiatric/Behavioral:  Positive for confusion. Negative for agitation and behavioral problems.      Immunization History  Administered Date(s) Administered   Fluad Quad(high Dose 65+) 01/14/2019, 02/04/2020, 02/05/2021   Influenza Split 01/12/2011, 02/18/2012, 02/08/2013   Influenza, High Dose Seasonal PF 03/03/2014   Influenza,inj,Quad PF,6+ Mos 12/22/2014, 12/25/2015, 12/30/2016, 01/30/2018   Influenza-Unspecified 02/15/2022, 01/31/2023   Moderna Sars-Covid-2 Vaccination 05/16/2019, 06/13/2019, 03/13/2020, 09/17/2020   Pfizer Covid-19 Vaccine Bivalent Booster 78yrs & up 01/21/2021   Pneumococcal Conjugate-13 12/19/2013   Pneumococcal Polysaccharide-23 12/30/2016   Pneumococcal-Unspecified 05/02/2006   Td 02/16/2011   Tdap 06/26/2020   Zoster Recombinant(Shingrix) 01/12/2018, 03/31/2018   Zoster, Live 06/11/2009   Pertinent  Health Maintenance Due  Topic Date Due   DEXA SCAN  Never done   INFLUENZA VACCINE  12/01/2023   MAMMOGRAM  Discontinued      02/05/2021   11:04 AM 02/11/2022   10:15 AM 03/13/2022    5:14 PM 04/18/2022    9:07 AM 02/21/2023   11:44  AM  Fall Risk  Falls in the past year? 1    0  Was there an injury with Fall? 1    0  Fall Risk Category Calculator 2    0  Fall Risk Category (Retired) Moderate      (RETIRED) Patient Fall Risk Level  Moderate fall risk Low fall risk Low fall risk   Patient at Risk for Falls Due to     No Fall Risks  Fall risk Follow up Falls prevention discussed    Falls evaluation completed   Functional Status Survey:    Vitals:   08/24/23 1418 08/24/23 1607  BP: (!) 142/77 137/81  Pulse: 82   Resp: 18   SpO2: 97%   Weight: 153 lb (69.4 kg)    Body mass index is 26.26 kg/m. Physical Exam Constitutional:      General: She is not in acute distress.    Appearance: She is well-developed. She is not diaphoretic.  HENT:     Head: Normocephalic and atraumatic.     Mouth/Throat:     Pharynx: No  oropharyngeal exudate.  Eyes:     Conjunctiva/sclera: Conjunctivae normal.     Pupils: Pupils are equal, round, and reactive to light.  Cardiovascular:     Rate and Rhythm: Normal rate and regular rhythm.     Heart sounds: Normal heart sounds.  Pulmonary:     Effort: Pulmonary effort is normal.     Breath sounds: Normal breath sounds.  Abdominal:     General: Bowel sounds are normal.     Palpations: Abdomen is soft.  Musculoskeletal:     Cervical back: Normal range of motion and neck supple.     Right lower leg: No edema.     Left lower leg: No edema.  Skin:    General: Skin is warm and dry.  Neurological:     Mental Status: She is alert.  Psychiatric:        Mood and Affect: Mood normal.     Labs reviewed: Recent Labs    02/21/23 1230 05/17/23 1000  NA 139 138  K 4.4 4.6  CL 103 102  CO2 26 21  GLUCOSE 86 77  BUN 19 17  CREATININE 0.87 0.94  CALCIUM 9.2 9.1   Recent Labs    02/21/23 1230 05/17/23 1000  AST 17 19  ALT 11 11  ALKPHOS 59 67  BILITOT 0.4 0.3  PROT 7.1 6.7  ALBUMIN 4.2 4.2   Recent Labs    02/21/23 1230  WBC 7.4  HGB 14.5  HCT 45.3  MCV 94.6  PLT 319.0   Lab Results  Component Value Date   TSH 23.100 (H) 05/17/2023   No results found for: "HGBA1C" Lab Results  Component Value Date   CHOL 172 02/04/2020   HDL 59.60 02/04/2020   LDLCALC 96 02/04/2020   TRIG 83.0 02/04/2020   CHOLHDL 3 02/04/2020    Significant Diagnostic Results in last 30 days:  No results found.  Assessment/Plan Acquired thrombophilia (HCC) Due to a fib, continues on eliquis  BID  Actinic keratosis Followed by dermatology  Allergic rhinitis Stable at this time, continues on zyrtec.  Aortic atherosclerosis (HCC) Noted on imaging, continues on eliquis   Coronary atherosclerosis of native coronary artery Stable, without reports of chest pains  Gastroesophageal reflux disease without esophagitis Controlled on omeprazole   Hypothyroidism TSH  continues to be elevated, will increase synthroid  to 112 mcg at this time- currently she is on 100 mcg.  IBS (irritable bowel syndrome) Ongoing, will add benefiber to regimen daily  MCI (mild cognitive impairment) Staff noting more memory decline. Will continue to monitor.    Osteopenia Will update dexa scan  Paroxysmal atrial fibrillation (HCC) Rate controlled. Continues on amiodarone   Dark stools Will get CBC at this time Ifob to be completed by staff    Camilo Cella K. Denney Fisherman [facility] [phone]

## 2023-08-24 NOTE — Assessment & Plan Note (Signed)
 Noted on imaging, continues on eliquis 

## 2023-08-24 NOTE — Assessment & Plan Note (Signed)
 Will update dexa scan

## 2023-08-28 DIAGNOSIS — K921 Melena: Secondary | ICD-10-CM | POA: Diagnosis not present

## 2023-08-28 LAB — CBC AND DIFFERENTIAL
HCT: 39 (ref 36–46)
Hemoglobin: 11.8 — AB (ref 12.0–16.0)
Neutrophils Absolute: 3361
Platelets: 407 10*3/uL — AB (ref 150–400)
WBC: 6.5

## 2023-08-28 LAB — CBC: RBC: 4.3 (ref 3.87–5.11)

## 2023-08-31 DIAGNOSIS — K921 Melena: Secondary | ICD-10-CM | POA: Diagnosis not present

## 2023-08-31 NOTE — Addendum Note (Signed)
 Addended by: Trinnity Breunig K on: 08/31/2023 02:35 PM   Modules accepted: Orders

## 2023-09-07 ENCOUNTER — Encounter: Payer: Self-pay | Admitting: Nurse Practitioner

## 2023-09-07 ENCOUNTER — Non-Acute Institutional Stay: Payer: Self-pay | Admitting: Nurse Practitioner

## 2023-09-07 DIAGNOSIS — Z0000 Encounter for general adult medical examination without abnormal findings: Secondary | ICD-10-CM

## 2023-09-07 NOTE — Patient Instructions (Signed)
  Ms. Ariel Phillips , Thank you for taking time to come for your Medicare Wellness Visit. I appreciate your ongoing commitment to your health goals. Please review the following plan we discussed and let me know if I can assist you in the future.    This is a list of the screening recommended for you and due dates:  Health Maintenance  Topic Date Due   DEXA scan (bone density measurement)  Never done   COVID-19 Vaccine (7 - Moderna risk 2024-25 season) 07/27/2023   Flu Shot  12/01/2023   Medicare Annual Wellness Visit  09/06/2024   DTaP/Tdap/Td vaccine (3 - Td or Tdap) 06/26/2030   Pneumonia Vaccine  Completed   Zoster (Shingles) Vaccine  Completed   HPV Vaccine  Aged Out   Meningitis B Vaccine  Aged Out   Mammogram  Discontinued

## 2023-09-07 NOTE — Progress Notes (Signed)
 Subjective:   Ariel Phillips is a 89 y.o. female who presents for Medicare Annual (Subsequent) preventive examination.  Visit Complete: In person at [facility]   Cardiac Risk Factors include: advanced age (>20men, >31 women);sedentary lifestyle     Objective:     Today's Vitals   09/07/23 0904  BP: 110/66  Pulse: 78  Resp: 17  Temp: (!) 97.5 F (36.4 C)  SpO2: 94%  Weight: 154 lb 8 oz (70.1 kg)  Height: 5\' 4"  (1.626 m)   Body mass index is 26.52 kg/m.     09/07/2023    9:16 AM 12/02/2022    1:23 PM 04/18/2022   10:29 AM 03/13/2022    5:14 PM 06/27/2020    4:09 AM 06/26/2020    3:30 PM 11/18/2019    1:59 PM  Advanced Directives  Does Patient Have a Medical Advance Directive? Yes Yes No No;Yes Yes Yes Yes  Type of Estate agent of [place];Out of facility DNR (pink MOST or yellow form);Living will Healthcare Power of [place];Out of facility DNR (pink MOST or yellow form);Living will  Living will Living will;Healthcare Power of [employer] Power of [place];Living will   Does patient want to make changes to medical advance directive? No - Patient declined No - Patient declined  No - Patient declined No - Patient declined No - Patient declined   Copy of Healthcare Power of Attorney in Chart? Yes - validated most recent copy scanned in chart (See row information) Yes - validated most recent copy scanned in chart (See row information)       Would patient like information on creating a medical advance directive?   No - Patient declined        Current Medications (verified) Outpatient Encounter Medications as of 09/07/2023  Medication Sig   acetaminophen  (TYLENOL ) 325 MG tablet Take 650 mg by mouth every 4 (four) hours as needed.   albuterol  (VENTOLIN  HFA) 108 (90 Base) MCG/ACT inhaler Inhale 2 puffs into the lungs 3 (three) times daily as needed.   amiodarone  (PACERONE ) 200 MG tablet Take 1 tablet (200 mg total) by mouth daily. TAKE ONE TABLET  (200 MG) BY MOUTH EVERY DAY   apixaban  (ELIQUIS ) 5 MG TABS tablet Take 1 tablet (5 mg total) by mouth 2 (two) times daily.   bismuth subsalicylate (PEPTO BISMOL) 262 MG/15ML suspension Take 30 mLs by mouth as needed.   carbamide peroxide (DEBROX) 6.5 % OTIC solution Place 5 drops into both ears at bedtime.   carboxymethylcellulose (REFRESH PLUS) 0.5 % SOLN Place 2 drops into both eyes 3 (three) times daily as needed.   cetirizine (ZYRTEC) 10 MG chewable tablet Chew 10 mg by mouth daily.   dextromethorphan-guaiFENesin  (ROBITUSSIN-DM) 10-100 MG/5ML liquid Take 5 mLs by mouth every 4 (four) hours as needed for cough.   diclofenac  Sodium (VOLTAREN ) 1 % GEL Apply topically as needed.   Glucose 15 GM/32ML GEL One Packet by mouth as needed.   levothyroxine  (SYNTHROID ) 112 MCG tablet Take 1 tablet (112 mcg total) by mouth daily before breakfast.   Loratadine 10 MG CAPS Take 1 capsule by mouth at bedtime.   magnesium hydroxide (MILK OF MAGNESIA) 400 MG/5ML suspension Take 5 mLs by mouth daily as needed for mild constipation.   nystatin (MYCOSTATIN/NYSTOP) powder Apply 1 Application topically 2 (two) times daily.   omeprazole  (PRILOSEC) 20 MG capsule Take 20 mg by mouth daily.   ondansetron  (ZOFRAN ) 4 MG tablet Take 4 mg by mouth every  8 (eight) hours as needed for nausea or vomiting.   polyethylene glycol (MIRALAX / GLYCOLAX) 17 g packet Take 17 g by mouth daily. Mon, Wed and Friday   Wheat Dextrin (BENEFIBER) POWD Take 1 Dose by mouth daily.   No facility-administered encounter medications on file as of 09/07/2023.    Allergies (verified) Grass pollen(k-o-r-t-swt vern), Penicillins, and Latex   History: Past Medical History:  Diagnosis Date   Allergic asthma    Allergic rhinitis due to pollen    Allergy    GERD (gastroesophageal reflux disease)    IBS (irritable bowel syndrome)    Osteopenia    Unspecified hypothyroidism    Past Surgical History:  Procedure Laterality Date   ANAL FISSURE  REPAIR  1950's   BASAL CELL CARCINOMA EXCISION  2000's   nose   CHOLECYSTECTOMY  2008   COLONOSCOPY  Multiple   Negative screening exams   ENDOSCOPIC RETROGRADE CHOLANGIOPANCREATOGRAPHY (ERCP) WITH PROPOFOL  N/A 02/13/2019   Procedure: ENDOSCOPIC RETROGRADE CHOLANGIOPANCREATOGRAPHY (ERCP) WITH PROPOFOL ;  Surgeon: Kenney Peacemaker, MD;  [facility];  Service: Endoscopy;  Laterality: N/A;   HEMORRHOID SURGERY  1953 or so   with fissure repair   ORIF ANKLE FRACTURE Right 06/27/2020   Procedure: OPEN REDUCTION INTERNAL FIXATION (ORIF) ANKLE FRACTURE;  Surgeon: Pink Bridges, DPM;  Location: [facility];  Service: Podiatry;  Laterality: Right;   REMOVAL OF STONES  02/13/2019   Procedure: REMOVAL OF STONES;  Surgeon: Kenney Peacemaker, MD;  [facility];  Service: Endoscopy;;   SPHINCTEROTOMY  02/13/2019   Procedure: [address];  Surgeon: Kenney Peacemaker, MD;  [facility];  Service: Endoscopy;;   TUBAL LIGATION     Family History  Problem Relation Age of Onset   COPD Mother    Cancer Mother        unclear diagnosis   Transient ischemic attack Mother    Diabetes Sister    Hypertension Sister    Stroke Other    Cancer Paternal Grandmother        ?breast   Heart disease Neg Hx    Social History   Socioeconomic History   Marital status: Divorced    Spouse name: Not on file   Number of children: 1   Years of education: Not on file   Highest education level: Not on file  Occupational History   Occupation: Retired historian--consultant  Tobacco Use   Smoking status: Never    Passive exposure: Past   Smokeless tobacco: Never  Vaping Use   Vaping status: Never Used  Substance and Sexual Activity   Alcohol  use: Yes    Comment: rare wine   Drug use: No   Sexual activity: Not Currently  Other Topics Concern   Not on file  Social History Narrative   Divorced and retired , moved from [place], [place]    1 Daughter in [place]   Has living  will      Daughter has health care POA.---[name]   Requests DNR--order done 06/19/12   Would not want prolonged mechanical ventilation   May accept tube feeds temporarily but not if cognitively unaware   Social Drivers of Health   Financial Resource Strain: Low Risk  (07/14/2021)   Overall Financial Resource Strain (CARDIA)    Difficulty of Paying Living Expenses: Not hard at all  Food Insecurity: No Food Insecurity (03/16/2022)   Hunger Vital Sign    Worried About Running Out of Food in the Last Year: Never true  Ran Out of Food in the Last Year: Never true  Transportation Needs: No Transportation Needs (03/16/2022)   PRAPARE - Administrator, Civil Service (Medical): No    Lack of Transportation (Non-Medical): No  Physical Activity: Not on file  Stress: Not on file  Social Connections: Not on file    Tobacco Counseling Counseling given: Not Answered   Clinical Intake:  Pre-visit preparation completed: Yes  Pain : No/denies pain     BMI - recorded: 26 Nutritional Status: BMI 25 -29 Overweight Diabetes: No  How often do you need to have someone help you when you read instructions, pamphlets, or other written materials from your doctor or pharmacy?: 1 - Never         Activities of Daily Living    09/07/2023   11:30 AM  In your present state of health, do you have any difficulty performing the following activities:  Hearing? 1  Comment hearing aides  Vision? 0  Difficulty concentrating or making decisions? 1  Walking or climbing stairs? 0  Dressing or bathing? 0  Doing errands, shopping? 1  Preparing Food and eating ? N  Comment does not prepare food  Using the Toilet? N  In the past six months, have you accidently leaked urine? N  Do you have problems with loss of bowel control? N  Managing your Medications? Y  Managing your Finances? Y  Housekeeping or managing your Housekeeping? Y    Patient Care Team: Valrie Gehrig, MD as PCP -  General (Family Medicine) Constancia Delton, MD as PCP - Cardiology (Cardiology) Boyce Byes, MD as PCP - Electrophysiology (Cardiology) Jonathan Neighbor, [facility] (Inactive) as Pharmacist (Pharmacist)  Indicate any recent Medical Services you may have received from other than [facility] providers in the past year (date may be approximate).     Assessment:    This is a routine wellness examination for Ariel Phillips.  Hearing/Vision screen No results found.   Goals Addressed   None    Depression Screen    09/07/2023   11:34 AM 02/21/2023   11:44 AM 02/05/2021   11:05 AM 02/04/2020   11:55 AM 02/04/2020   11:02 AM 01/14/2019    3:09 PM 01/12/2018   11:18 AM  PHQ 2/9 Scores  PHQ - 2 Score 0 0 1 1 1  0 0    Fall Risk    09/07/2023   11:34 AM 02/21/2023   11:44 AM 02/05/2021   11:04 AM 02/04/2020   11:55 AM 02/04/2020   10:54 AM  Fall Risk   Falls in the past year? 0 0 1 0 0  Number falls in past yr:  0 0  0  Injury with Fall?  0 1  0  Risk for fall due to :  No Fall Risks     Follow up  Falls evaluation completed Falls prevention discussed      MEDICARE RISK AT HOME: Medicare Risk at Home Any stairs in or around the home?: Yes If so, are there any without handrails?: No Home free of loose throw rugs in walkways, pet beds, electrical cords, etc?: Yes Adequate lighting in your home to reduce risk of falls?: Yes Life alert?: Yes Use of a cane, walker or w/c?: No Grab bars in the bathroom?: Yes Shower chair or bench in shower?: Yes Elevated toilet seat or a handicapped toilet?: Yes  TIMED UP AND GO:  Was the test performed?  No    Cognitive Function:  Immunizations Immunization History  Administered Date(s) Administered   Fluad Quad(high Dose 65+) 01/14/2019, 02/04/2020, 02/05/2021   Influenza Split 01/12/2011, 02/18/2012, 02/08/2013   Influenza, High Dose Seasonal PF 03/03/2014   Influenza,inj,Quad PF,6+ Mos 12/22/2014, 12/25/2015, 12/30/2016, 01/30/2018    Influenza-Unspecified 02/15/2022, 01/31/2023   Moderna Sars-Covid-2 Vaccination 05/16/2019, 06/13/2019, 03/13/2020, 09/17/2020   Pfizer Covid-19 Vaccine Bivalent Booster 62yrs & up 01/21/2021   Pneumococcal Conjugate-13 12/19/2013   Pneumococcal Polysaccharide-23 12/30/2016   Pneumococcal-Unspecified 05/02/2006   Td 02/16/2011   Tdap 06/26/2020   Unspecified SARS-COV-2 Vaccination 01/27/2023   Zoster Recombinant(Shingrix) 01/12/2018, 03/31/2018   Zoster, Live 06/11/2009    TDAP status: Up to date  Flu Vaccine status: Up to date  Pneumococcal vaccine status: Up to date  Covid-19 vaccine status: Information provided on how to obtain vaccines.   Qualifies for Shingles Vaccine? Yes   Zostavax completed No   Shingrix Completed?: Yes  Screening Tests Health Maintenance  Topic Date Due   DEXA SCAN  Never done   COVID-19 Vaccine (7 - Moderna risk 2024-25 season) 07/27/2023   INFLUENZA VACCINE  12/01/2023   Medicare Annual Wellness (AWV)  09/06/2024   DTaP/Tdap/Td (3 - Td or Tdap) 06/26/2030   Pneumonia Vaccine 70+ Years old  Completed   Zoster Vaccines- Shingrix  Completed   HPV VACCINES  Aged Out   Meningococcal B Vaccine  Aged Out   MAMMOGRAM  Discontinued    Health Maintenance  Health Maintenance Due  Topic Date Due   DEXA SCAN  Never done   COVID-19 Vaccine (7 - Moderna risk 2024-25 season) 07/27/2023    Colorectal cancer screening: No longer required.   Mammogram status: No longer required due to age .  Bone Density status: Ordered  . Pt provided with contact info and advised to call to schedule appt.  Lung Cancer Screening: (Low Dose CT Chest recommended if Age 69-80 years, 20 pack-year currently smoking OR have quit w/in 15years.) does not qualify.   Lung Cancer Screening Referral: na  Additional Screening:  Hepatitis C Screening: does not qualify; Completed   Vision Screening: Recommended annual ophthalmology exams for early detection of glaucoma and  other disorders of the eye. Is the patient up to date with their annual eye exam?  Yes  Who is the provider or what is the name of the office in which the patient attends annual eye exams? [facility] If pt is not established with a provider, would they like to be referred to a provider to establish care? No .   Dental Screening: Recommended annual dental exams for proper oral hygiene   Community Resource Referral / Chronic Care Management: CRR required this visit?  No   CCM required this visit?  No     Plan:     I have personally reviewed and noted the following in the patient's chart:   Medical and social history Use of alcohol , tobacco or illicit drugs  Current medications and supplements including opioid prescriptions. Patient is not currently taking opioid prescriptions. Functional ability and status Nutritional status Physical activity Advanced directives List of other physicians Hospitalizations, surgeries, and ER visits in previous 12 months Vitals Screenings to include cognitive, depression, and falls Referrals and appointments  In addition, I have reviewed and discussed with patient certain preventive protocols, quality metrics, and best practice recommendations. A written personalized care plan for preventive services as well as general preventive health recommendations were provided to patient.     Verma Gobble, NP   09/07/2023

## 2023-09-12 DIAGNOSIS — D225 Melanocytic nevi of trunk: Secondary | ICD-10-CM | POA: Diagnosis not present

## 2023-09-12 DIAGNOSIS — D2262 Melanocytic nevi of left upper limb, including shoulder: Secondary | ICD-10-CM | POA: Diagnosis not present

## 2023-09-12 DIAGNOSIS — Z85828 Personal history of other malignant neoplasm of skin: Secondary | ICD-10-CM | POA: Diagnosis not present

## 2023-09-12 DIAGNOSIS — D2272 Melanocytic nevi of left lower limb, including hip: Secondary | ICD-10-CM | POA: Diagnosis not present

## 2023-09-12 DIAGNOSIS — D2261 Melanocytic nevi of right upper limb, including shoulder: Secondary | ICD-10-CM | POA: Diagnosis not present

## 2023-09-12 DIAGNOSIS — L821 Other seborrheic keratosis: Secondary | ICD-10-CM | POA: Diagnosis not present

## 2023-09-13 DIAGNOSIS — B351 Tinea unguium: Secondary | ICD-10-CM | POA: Diagnosis not present

## 2023-09-13 DIAGNOSIS — I7091 Generalized atherosclerosis: Secondary | ICD-10-CM | POA: Diagnosis not present

## 2023-09-13 DIAGNOSIS — M2041 Other hammer toe(s) (acquired), right foot: Secondary | ICD-10-CM | POA: Diagnosis not present

## 2023-09-13 DIAGNOSIS — M2042 Other hammer toe(s) (acquired), left foot: Secondary | ICD-10-CM | POA: Diagnosis not present

## 2023-09-19 DIAGNOSIS — H6123 Impacted cerumen, bilateral: Secondary | ICD-10-CM | POA: Diagnosis not present

## 2023-09-19 DIAGNOSIS — H903 Sensorineural hearing loss, bilateral: Secondary | ICD-10-CM | POA: Diagnosis not present

## 2023-10-13 ENCOUNTER — Ambulatory Visit: Attending: Student | Admitting: Student

## 2023-10-13 ENCOUNTER — Encounter: Payer: Self-pay | Admitting: Student

## 2023-10-13 VITALS — BP 122/68 | HR 81 | Ht 64.0 in | Wt 154.8 lb

## 2023-10-13 DIAGNOSIS — Z79899 Other long term (current) drug therapy: Secondary | ICD-10-CM | POA: Insufficient documentation

## 2023-10-13 DIAGNOSIS — I480 Paroxysmal atrial fibrillation: Secondary | ICD-10-CM | POA: Insufficient documentation

## 2023-10-13 DIAGNOSIS — I2510 Atherosclerotic heart disease of native coronary artery without angina pectoris: Secondary | ICD-10-CM | POA: Insufficient documentation

## 2023-10-13 NOTE — Patient Instructions (Signed)
 Medication Instructions:  Your physician recommends that you continue on your current medications as directed. Please refer to the Current Medication list given to you today.   *If you need a refill on your cardiac medications before your next appointment, please call your pharmacy*  Lab Work: Your provider would like for you to have following labs drawn today BMP, CBC, TSH.  If you have labs (blood work) drawn today and your tests are completely normal, you will receive your results only by: MyChart Message (if you have MyChart) OR A paper copy in the mail If you have any lab test that is abnormal or we need to change your treatment, we will call you to review the results.  Testing/Procedures: None ordered at this time   Follow-Up: At [facility], you and your health needs are our priority.  As part of our continuing mission to provide you with exceptional heart care, our providers are all part of one team.  This team includes your primary Cardiologist (physician) and Advanced Practice Providers or APPs (Physician Assistants and Nurse Practitioners) who all work together to provide you with the care you need, when you need it.  Your next appointment:   3 month(s)  Provider:   Richardo Chandler, MD, Harvie Liner, MD, or Suzann Riddle, NP   Follow up with General Cardiology in 1 year   We recommend signing up for the patient portal called MyChart.  Sign up information is provided on this After Visit Summary.  MyChart is used to connect with patients for Virtual Visits (Telemedicine).  Patients are able to view lab/test results, encounter notes, upcoming appointments, etc.  Non-urgent messages can be sent to your provider as well.   To learn more about what you can do with MyChart, go to [email or web address].

## 2023-10-13 NOTE — Progress Notes (Signed)
 Cardiology Clinic Note   Date: 10/13/2023 ID: DAJHA URQUILLA, DOB 1932-06-07, MRN [id number]  [facility] Providers Cardiologist:  Constancia Delton, MD Electrophysiologist:  Boyce Byes, MD   Chief Complaint   Ariel Phillips is a 89 y.o. female who presents to the clinic today for routine follow up.   Patient Profile   Ariel Phillips is followed by Dr. Junnie Olives and Dr. Marven Slimmer for the history outlined below.      Past medical history significant for: CAD. Coronary CTA with FFR 07/11/2019: Calcium score of 200 (54 percentile).  Moderate stenosis proximal and mid to distal LAD.  FFR analysis showed no significant stenosis in the LAD and significant stenosis in the second diagonal which was a small vessel. PAF. Onset October 2023. Echo 03/22/2022: EF 60 to 65%.  No RWMA.  Mild LVH of the basal-septal segment.  Grade I DD.  Normal RV size/function.  Mild to moderate MR.  Mild AI.  Borderline dilatation of ascending aorta 39 mm. Hypothyroidism. Mild cognitive impairment.  In summary, patient was first evaluated by Dr. Junnie Olives on 06/10/2019 for chest pain.  She underwent coronary CTA with FFR which demonstrated calcium score of 200 and no significant stenosis in LAD.  Echo demonstrated EF 60 to 65%,, normal RV size/function.  Patient was started on aspirin .  Decision was made to not start statin secondary to age.  She transitioned her care to [facility] cardiology in July 2023.  Echo at that time showed EF > 55, normal wall motion, Grade I DD, normal RV function, moderate AI, mild MR.  She reestablished care with Dr. Junnie Olives in October 2023.  She was seen in the ED in October 2023 with palpitations and EMS reporting A-fib with RVR in the 150s to 170s.  She was started on amiodarone  with conversion to sinus rhythm.  She was placed on oral amiodarone  and Eliquis .  Echo at that time showed normal LV/RV function as detailed above.  She followed up in the office in  January 2024 and was maintaining sinus rhythm on amiodarone .  Patient was last seen in the office by Dr. Marven Slimmer on 05/17/2023.  Labs were drawn and TSH was elevated.  Patient was instructed to follow-up with PCP.     History of Present Illness    Today, patient is here alone. She reports she is doing well. Patient denies shortness of breath, dyspnea on exertion, lower extremity edema, orthopnea or PND. No chest pain, pressure, or tightness. No palpitations. She states when she was afib she did not have palpitations but felt like something was wrong. She has not had that sensation again since. She denies blood in urine. She reports very dark brown stool for over a year. She takes pepto bismol but could not recall the last time she had a dose. She does not participate in regular exercise but does a lot of walking around [facility].     ROS: All other systems reviewed and are otherwise negative except as noted in History of Present Illness.  EKGs/Labs Reviewed    EKG Interpretation Date/Time:  Friday October 13 2023 14:51:55 EDT Ventricular Rate:  81 PR Interval:  224 QRS Duration:  90 QT Interval:  408 QTC Calculation: 473 R Axis:   29  Text Interpretation: Sinus rhythm with 1st degree A-V block When compared with ECG of 17-May-2023 09:32, No significant change was found Confirmed by Morey Ar [phone]) on 10/13/2023 3:06:21 PM   05/17/2023: ALT 11; AST 19; BUN  17; Creatinine, Ser 0.94; Potassium 4.6; Sodium 138   08/28/2023: Hemoglobin 11.8; WBC 6.5   07/03/2023: TSH 14.54   Risk Assessment/Calculations     CHA2DS2-VASc Score = 3   This indicates a 3.2% annual risk of stroke. The patient's score is based upon: CHF History: 0 HTN History: 0 Diabetes History: 0 Stroke History: 0 Vascular Disease History: 0 Age Score: 2 Gender Score: 1             Physical Exam    VS:  BP 122/68   Pulse 81   Ht 5' 4 (1.626 m)   Wt 154 lb 12.8 oz (70.2 kg)   SpO2 95%   BMI 26.57  kg/m  , BMI Body mass index is 26.57 kg/m.  GEN: Well nourished, well developed, in no acute distress. Neck: No JVD or carotid bruits. Cardiac: RRR. 1/6 systolic murmur. No rubs or gallops.   Respiratory:  Respirations regular and unlabored. Clear to auscultation without rales, wheezing or rhonchi. GI: Soft, nontender, nondistended. Extremities: Radials/DP/PT 2+ and equal bilaterally. No clubbing or cyanosis. No edema.  Skin: Warm and dry, no rash. Neuro: Strength intact.  Assessment & Plan   CAD Coronary CTA with FFR March 2021 showed a calcium score of 200 with moderate stenosis proximal and mid to distal LAD, FFR showed no significant stenosis in the LAD and significant stenosis in the second diagonal which was a small vessel.  Decision was made to not start statin secondary to patient's age.  Patient denies chest pain, pressure or tightness.  EKG shows NSR with 1st degree AV block.  - Not on aspirin  secondary to Eliquis .  PAF Onset October 2023.  Maintaining sinus rhythm on amiodarone .  Echo November 2023 showed EF 60 to 65%, grade 1 DD, normal RV size/function, mild to moderate MR, mild AI.  Denies blood in urine. She reports very dark brown stool for over a year. She takes pepto bismol but could not recall her last dose. She did not have palpitations when she first went into afib but had a feeling something was wrong. She has not had that feeling since. EKG shows NSR with first degree AV block. She is not sure if medications were adjusted by Dr. Jann Melody related to her thyroid .  - CBC, BMP, TSH today. If hemoglobin is decreased or TSH is still elevated will refer her to Dr. Jann Melody.  - Continue amiodarone  and Eliquis . Appropriate Eliquis  dose.    Disposition: CBC, BMP, TSH today. Follow up with EP in 3 months. Return to general cardiology in 1 year or sooner as needed.          Signed, Lonell Rives. Pragya Lofaso, DNP, NP-C

## 2023-10-14 LAB — BASIC METABOLIC PANEL WITH GFR
BUN/Creatinine Ratio: 24 (ref 12–28)
BUN: 23 mg/dL (ref 8–27)
CO2: 17 mmol/L — ABNORMAL LOW (ref 20–29)
Calcium: 9.3 mg/dL (ref 8.7–10.3)
Chloride: 103 mmol/L (ref 96–106)
Creatinine, Ser: 0.94 mg/dL (ref 0.57–1.00)
Glucose: 108 mg/dL — ABNORMAL HIGH (ref 70–99)
Potassium: 5 mmol/L (ref 3.5–5.2)
Sodium: 138 mmol/L (ref 134–144)
eGFR: 58 mL/min/{1.73_m2} — ABNORMAL LOW (ref >59)

## 2023-10-14 LAB — TSH: TSH: 11 u[IU]/mL — ABNORMAL HIGH (ref 0.450–4.500)

## 2023-10-14 LAB — CBC
Hematocrit: 32.4 % — ABNORMAL LOW (ref 34.0–46.6)
Hemoglobin: 9.8 g/dL — ABNORMAL LOW (ref 11.1–15.9)
MCH: 24.7 pg — ABNORMAL LOW (ref 26.6–33.0)
MCHC: 30.2 g/dL — ABNORMAL LOW (ref 31.5–35.7)
MCV: 82 fL (ref 79–97)
Platelets: 424 10*3/uL (ref 150–450)
RBC: 3.96 x10E6/uL (ref 3.77–5.28)
RDW: 13.6 % (ref 11.7–15.4)
WBC: 6.8 10*3/uL (ref 3.4–10.8)

## 2023-10-16 ENCOUNTER — Other Ambulatory Visit: Payer: Self-pay | Admitting: Nurse Practitioner

## 2023-10-16 ENCOUNTER — Ambulatory Visit: Payer: Self-pay | Admitting: Student

## 2023-10-16 DIAGNOSIS — D649 Anemia, unspecified: Secondary | ICD-10-CM

## 2023-11-17 ENCOUNTER — Non-Acute Institutional Stay: Payer: Self-pay | Admitting: Student

## 2023-11-17 ENCOUNTER — Encounter: Payer: Self-pay | Admitting: Student

## 2023-11-17 DIAGNOSIS — R460 Very low level of personal hygiene: Secondary | ICD-10-CM | POA: Diagnosis not present

## 2023-11-17 DIAGNOSIS — F03B0 Unspecified dementia, moderate, without behavioral disturbance, psychotic disturbance, mood disturbance, and anxiety: Secondary | ICD-10-CM

## 2023-11-17 DIAGNOSIS — K529 Noninfective gastroenteritis and colitis, unspecified: Secondary | ICD-10-CM | POA: Diagnosis not present

## 2023-11-17 DIAGNOSIS — D649 Anemia, unspecified: Secondary | ICD-10-CM | POA: Diagnosis not present

## 2023-11-17 NOTE — Progress Notes (Signed)
 Location:  Other [facility].  Nursing Home Room Number: [id number] Place of Service:  ALF [phone]) Provider:  Abdul Fine, MD  Patient Care Team: Abdul Fine, MD as PCP - General (Family Medicine) Darliss Rogue, MD as PCP - Cardiology (Cardiology) Cindie Ole DASEN, MD as PCP - Electrophysiology (Cardiology) Fate Morna SAILOR, [facility] (Inactive) as Pharmacist (Pharmacist)  Extended Emergency Contact Information Primary Emergency Contact: Maritza [place], [place] Home Phone: [phone] Work Phone: [phone] Mobile Phone: [phone] Relation: Daughter  Code Status:  DNR Goals of care: Advanced Directive information    09/07/2023    9:16 AM  Advanced Directives  Does Patient Have a Medical Advance Directive? Yes  Type of Estate agent of [place];Out of facility DNR (pink MOST or yellow form);Living will  Does patient want to make changes to medical advance directive? No - Patient declined  Copy of Healthcare Power of Attorney in Chart? Yes - validated most recent copy scanned in chart (See row information)     Chief Complaint  Patient presents with   Medical Management of Chronic Issues    Medical Management of Chronic Issues.      HPI:  Pt is a 89 y.o. female seen today for medical management of chronic diseases.   History of Present Illness The patient presents with chronic diarrhea and hygiene concerns.  She experiences chronic diarrhea, which has been persistent. The diarrhea sometimes improves with thrombin but remains frequent. She does not consistently inform her caregivers about the frequency of her stools, limiting effective management. She uses Depend diapers, which she finds easy to change. She has been taking Benefiber, a fiber supplement, but is unsure if she should continue. She describes it as unpleasant and is open to trying psyllium as an alternative.  On Wednesday, November 15, 2023, her toilet overflowed due to excessive toilet paper use, causing water to flood her apartment.  Her hygiene routine includes showering once or twice a week. She prefers baths over showers but acknowledges the need for regular showers to maintain cleanliness, especially given her diarrhea. She participates in social activities at her assisted living facility, including exercise classes and communal meals. She values these interactions and is mindful of maintaining personal hygiene to avoid embarrassment.  She has not seen her daughter, Rhoda, recently, as she lives in [place], and she no longer travels alone.  No significant stomach pain, although she occasionally experiences low abdominal discomfort.  Social History Living Situation: Resides in an assisted living facility. The patient has memory issues and requires assistance with daily activities such as showering. She participates in social activities and exercise classes at the facility. The patient has a daughter who lives in [place] and visits occasionally. The patient used to travel by train but no longer does due to concerns about missing stops.  Past Medical History:  Diagnosis Date   Allergic asthma    Allergic rhinitis due to pollen    Allergy    GERD (gastroesophageal reflux disease)    IBS (irritable bowel syndrome)    Osteopenia    Unspecified hypothyroidism    Past Surgical History:  Procedure Laterality Date   ANAL FISSURE REPAIR  1950's   BASAL CELL CARCINOMA EXCISION  2000's   nose   CHOLECYSTECTOMY  2008   COLONOSCOPY  Multiple   Negative screening exams   ENDOSCOPIC RETROGRADE CHOLANGIOPANCREATOGRAPHY (ERCP) WITH PROPOFOL  N/A 02/13/2019   Procedure: ENDOSCOPIC  RETROGRADE CHOLANGIOPANCREATOGRAPHY (ERCP) WITH PROPOFOL ;  Surgeon: Avram Lupita BRAVO, MD;  Location: [facility];  Service: Endoscopy;  Laterality: N/A;   HEMORRHOID SURGERY  1953 or so   with fissure repair   ORIF ANKLE FRACTURE Right  06/27/2020   Procedure: OPEN REDUCTION INTERNAL FIXATION (ORIF) ANKLE FRACTURE;  Surgeon: Lennie Barter, DPM;  Location: [facility] ORS;  Service: Podiatry;  Laterality: Right;   REMOVAL OF STONES  02/13/2019   Procedure: REMOVAL OF STONES;  Surgeon: Avram Lupita BRAVO, MD;  Location: [facility];  Service: Endoscopy;;   SPHINCTEROTOMY  02/13/2019   Procedure: ANNETT;  Surgeon: Avram Lupita BRAVO, MD;  Location: [facility];  Service: Endoscopy;;   TUBAL LIGATION      Allergies  Allergen Reactions   Grass Pollen(K-O-R-T-Swt Vern) Other (See Comments)   Penicillins Other (See Comments)    Did it involve swelling of the face/tongue/throat, SOB, or low BP? No Did it involve sudden or severe rash/hives, skin peeling, or any reaction on the inside of your mouth or nose? No Did you need to seek medical attention at a hospital or doctor's office? Yes When did it last happen?      20+ years If all above answers are NO, may proceed with cephalosporin use.  Pt does not remember the reaction   Latex Rash    Outpatient Encounter Medications as of 11/17/2023  Medication Sig   acetaminophen  (TYLENOL ) 325 MG tablet Take 650 mg by mouth every 4 (four) hours as needed.   albuterol  (VENTOLIN  HFA) 108 (90 Base) MCG/ACT inhaler Inhale 2 puffs into the lungs 3 (three) times daily as needed.   aluminum-magnesium hydroxide 200-200 MG/5ML suspension Take 10 mLs by mouth every 6 (six) hours as needed for indigestion.   amiodarone  (PACERONE ) 200 MG tablet Take 1 tablet (200 mg total) by mouth daily. TAKE ONE TABLET (200 MG) BY MOUTH EVERY DAY   apixaban  (ELIQUIS ) 5 MG TABS tablet Take 1 tablet (5 mg total) by mouth 2 (two) times daily.   bismuth subsalicylate (PEPTO BISMOL) 262 MG/15ML suspension Take 30 mLs by mouth as needed.   carbamide peroxide (DEBROX) 6.5 % OTIC solution Place 5 drops into both ears at bedtime.   carboxymethylcellulose (REFRESH PLUS) 0.5 % SOLN Place 2 drops into both eyes 3 (three) times  daily as needed.   cetirizine (ZYRTEC) 10 MG chewable tablet Chew 10 mg by mouth daily.   dextromethorphan-guaiFENesin  (ROBITUSSIN-DM) 10-100 MG/5ML liquid Take 5 mLs by mouth every 4 (four) hours as needed for cough.   diclofenac  Sodium (VOLTAREN ) 1 % GEL Apply topically as needed.   Glucose 15 GM/32ML GEL One Packet by mouth as needed.   levothyroxine  (SYNTHROID ) 112 MCG tablet Take 1 tablet (112 mcg total) by mouth daily before breakfast.   Loratadine 10 MG CAPS Take 1 capsule by mouth at bedtime.   magnesium hydroxide (MILK OF MAGNESIA) 400 MG/5ML suspension Take 5 mLs by mouth daily as needed for mild constipation.   nystatin (MYCOSTATIN/NYSTOP) powder Apply 1 Application topically 2 (two) times daily.   omeprazole  (PRILOSEC) 20 MG capsule Take 20 mg by mouth daily.   ondansetron  (ZOFRAN ) 4 MG tablet Take 4 mg by mouth every 8 (eight) hours as needed for nausea or vomiting.   OXYGEN Inhale into the lungs. 2lpm   polyethylene glycol (MIRALAX / GLYCOLAX) 17 g packet Take 17 g by mouth daily. Mon, Wed and Friday   Wheat Dextrin (BENEFIBER) POWD Take 1 Dose by mouth daily.   No facility-administered encounter  medications on file as of 11/17/2023.    Review of Systems  Immunization History  Administered Date(s) Administered   Fluad Quad(high Dose 65+) 01/14/2019, 02/04/2020, 02/05/2021   Influenza Split 01/12/2011, 02/18/2012, 02/08/2013   Influenza, High Dose Seasonal PF 03/03/2014   Influenza,inj,Quad PF,6+ Mos 12/22/2014, 12/25/2015, 12/30/2016, 01/30/2018   Influenza-Unspecified 02/15/2022, 01/31/2023   Moderna Sars-Covid-2 Vaccination 05/16/2019, 06/13/2019, 03/13/2020, 09/17/2020   Pfizer Covid-19 Vaccine Bivalent Booster 41yrs & up 01/21/2021   Pneumococcal Conjugate-13 12/19/2013   Pneumococcal Polysaccharide-23 12/30/2016   Pneumococcal-Unspecified 05/02/2006   Td 02/16/2011   Tdap 06/26/2020   Unspecified SARS-COV-2 Vaccination 01/27/2023, 08/11/2023   Zoster  Recombinant(Shingrix) 01/12/2018, 03/31/2018   Zoster, Live 06/11/2009   Pertinent  Health Maintenance Due  Topic Date Due   DEXA SCAN  Never done   INFLUENZA VACCINE  12/01/2023   MAMMOGRAM  Discontinued      02/11/2022   10:15 AM 03/13/2022    5:14 PM 04/18/2022    9:07 AM 02/21/2023   11:44 AM 09/07/2023   11:34 AM  Fall Risk  Falls in the past year?    0 0  Was there an injury with Fall?    0   Fall Risk Category Calculator    0   (RETIRED) Patient Fall Risk Level Moderate fall risk  Low fall risk  Low fall risk     Patient at Risk for Falls Due to    No Fall Risks   Fall risk Follow up    Falls evaluation completed      Data saved with a previous flowsheet row definition   Functional Status Survey:    Vitals:   11/17/23 1241  BP: 129/73  Pulse: 77  Resp: (!) 21  Temp: (!) 97.4 F (36.3 C)  SpO2: 96%  Weight: 155 lb (70.3 kg)  Height: 5' 4 (1.626 m)   Body mass index is 26.61 kg/m. Physical Exam Constitutional:      Appearance: Normal appearance.  Cardiovascular:     Rate and Rhythm: Normal rate and regular rhythm.     Pulses: Normal pulses.     Heart sounds: Normal heart sounds.  Pulmonary:     Effort: Pulmonary effort is normal.  Abdominal:     General: Abdomen is flat. Bowel sounds are normal.     Palpations: Abdomen is soft.  Musculoskeletal:        General: No swelling or tenderness.  Skin:    General: Skin is warm and dry.  Neurological:     Mental Status: She is alert. She is disoriented.     Gait: Gait normal.  Psychiatric:        Mood and Affect: Mood normal.     Labs reviewed: Recent Labs    02/21/23 1230 05/17/23 1000 10/13/23 1538  NA 139 138 138  K 4.4 4.6 5.0  CL 103 102 103  CO2 26 21 17*  GLUCOSE 86 77 108*  BUN 19 17 23   CREATININE 0.87 0.94 0.94  CALCIUM 9.2 9.1 9.3   Recent Labs    02/21/23 1230 05/17/23 1000  AST 17 19  ALT 11 11  ALKPHOS 59 67  BILITOT 0.4 0.3  PROT 7.1 6.7  ALBUMIN 4.2 4.2   Recent  Labs    02/21/23 1230 08/28/23 0000 10/13/23 1538  WBC 7.4 6.5 6.8  NEUTROABS  --  3,361.00  --   HGB 14.5 11.8* 9.8*  HCT 45.3 39 32.4*  MCV 94.6  --  82  PLT  319.0 407* 424   Lab Results  Component Value Date   TSH 11.000 (H) 10/13/2023   No results found for: HGBA1C Lab Results  Component Value Date   CHOL 172 02/04/2020   HDL 59.60 02/04/2020   LDLCALC 96 02/04/2020   TRIG 83.0 02/04/2020   CHOLHDL 3 02/04/2020    Significant Diagnostic Results in last 30 days:  No results found.  Assessment/Plan Chronic diarrhea Chronic diarrhea with frequent episodes, leading to overuse of toilet paper and potential clogging of the toilet. No new changes in the pattern of diarrhea. Benefiber has been used but may not be effective enough. Of note, patient was seen by cardiology 10/2023 and noted to have a drop in hemoglobin from 14 to 9. GI referral was placed around the time of appointment.  - Switch from Benefiber to psyllium to manage bowel movements more effectively. - Instruct her to report episodes of diarrhea to staff for appropriate management. - Follow up to determine when patient can have GI appointment.   Hygiene maintenance Hygiene maintenance is important, especially with chronic diarrhea, to prevent infections and maintain social comfort. She prefers baths but will adhere to a shower schedule to ensure cleanliness. - Encourage showering at least twice a week to maintain hygiene and prevent infections. - Discuss with staff to provide reminders or check-ins for showering if needed.  Dementia Memory issues with some difficulty recalling recent events such as the toilet overflow incident. Generally able to remember scheduled activities. Patient has some safety awareness at this time, however, now that she has clear functional decline her memory loss has progressed from MCI to dementia.  - staffing to provide reminders for showers - supportive measures for continued memory  decline  Ankle fracture History of ankle fracture with no current issues reported.  Family/ staff Communication: nursing  Labs/tests ordered:  CBC, TSH

## 2023-11-20 NOTE — Addendum Note (Signed)
 Addended by: Ran Tullis on: 11/20/2023 10:31 PM   Modules accepted: Orders

## 2023-11-23 DIAGNOSIS — I2510 Atherosclerotic heart disease of native coronary artery without angina pectoris: Secondary | ICD-10-CM | POA: Diagnosis not present

## 2023-11-23 DIAGNOSIS — E039 Hypothyroidism, unspecified: Secondary | ICD-10-CM | POA: Diagnosis not present

## 2023-11-24 NOTE — Addendum Note (Signed)
 Addended by: Shamariah Shewmake on: 11/24/2023 03:58 PM   Modules accepted: Orders

## 2023-11-24 NOTE — Addendum Note (Signed)
 Addended by: Lilyanah Celestin on: 11/24/2023 03:54 PM   Modules accepted: Orders

## 2023-11-27 DIAGNOSIS — R978 Other abnormal tumor markers: Secondary | ICD-10-CM | POA: Diagnosis not present

## 2023-11-27 DIAGNOSIS — I2510 Atherosclerotic heart disease of native coronary artery without angina pectoris: Secondary | ICD-10-CM | POA: Diagnosis not present

## 2023-11-27 DIAGNOSIS — N390 Urinary tract infection, site not specified: Secondary | ICD-10-CM | POA: Diagnosis not present

## 2023-11-27 DIAGNOSIS — K589 Irritable bowel syndrome without diarrhea: Secondary | ICD-10-CM | POA: Diagnosis not present

## 2023-11-27 DIAGNOSIS — D62 Acute posthemorrhagic anemia: Secondary | ICD-10-CM | POA: Diagnosis not present

## 2023-11-27 LAB — CBC AND DIFFERENTIAL
HCT: 31 — AB (ref 36–46)
Hemoglobin: 8.6 — AB (ref 12.0–16.0)
Neutrophils Absolute: 3082
Platelets: 436 K/uL — AB (ref 150–400)
WBC: 7.2

## 2023-11-27 LAB — VITAMIN B12: Vitamin B-12: 372

## 2023-11-27 LAB — IRON,TIBC AND FERRITIN PANEL
%SAT: 3
Ferritin: 3
Iron: 13
TIBC: 427

## 2023-11-27 LAB — CBC: RBC: 3.9 (ref 3.87–5.11)

## 2023-11-27 LAB — POCT ERYTHROCYTE SEDIMENTATION RATE, NON-AUTOMATED: Sed Rate: 9

## 2023-11-29 DIAGNOSIS — F0390 Unspecified dementia without behavioral disturbance: Secondary | ICD-10-CM | POA: Diagnosis not present

## 2023-11-29 DIAGNOSIS — R4189 Other symptoms and signs involving cognitive functions and awareness: Secondary | ICD-10-CM | POA: Diagnosis not present

## 2023-11-29 DIAGNOSIS — R278 Other lack of coordination: Secondary | ICD-10-CM | POA: Diagnosis not present

## 2023-11-29 DIAGNOSIS — K589 Irritable bowel syndrome without diarrhea: Secondary | ICD-10-CM | POA: Diagnosis not present

## 2023-11-29 DIAGNOSIS — Z741 Need for assistance with personal care: Secondary | ICD-10-CM | POA: Diagnosis not present

## 2023-12-11 DIAGNOSIS — Z741 Need for assistance with personal care: Secondary | ICD-10-CM | POA: Diagnosis not present

## 2023-12-11 DIAGNOSIS — F0390 Unspecified dementia without behavioral disturbance: Secondary | ICD-10-CM | POA: Diagnosis not present

## 2023-12-11 DIAGNOSIS — R278 Other lack of coordination: Secondary | ICD-10-CM | POA: Diagnosis not present

## 2023-12-11 DIAGNOSIS — K589 Irritable bowel syndrome without diarrhea: Secondary | ICD-10-CM | POA: Diagnosis not present

## 2023-12-11 DIAGNOSIS — R4189 Other symptoms and signs involving cognitive functions and awareness: Secondary | ICD-10-CM | POA: Diagnosis not present

## 2023-12-15 DIAGNOSIS — H6123 Impacted cerumen, bilateral: Secondary | ICD-10-CM | POA: Diagnosis not present

## 2023-12-15 DIAGNOSIS — F0390 Unspecified dementia without behavioral disturbance: Secondary | ICD-10-CM | POA: Diagnosis not present

## 2023-12-15 DIAGNOSIS — H903 Sensorineural hearing loss, bilateral: Secondary | ICD-10-CM | POA: Diagnosis not present

## 2023-12-18 DIAGNOSIS — Q130 Coloboma of iris: Secondary | ICD-10-CM | POA: Diagnosis not present

## 2023-12-18 DIAGNOSIS — H2513 Age-related nuclear cataract, bilateral: Secondary | ICD-10-CM | POA: Diagnosis not present

## 2023-12-18 DIAGNOSIS — H35371 Puckering of macula, right eye: Secondary | ICD-10-CM | POA: Diagnosis not present

## 2023-12-27 DIAGNOSIS — R278 Other lack of coordination: Secondary | ICD-10-CM | POA: Diagnosis not present

## 2023-12-27 DIAGNOSIS — R4189 Other symptoms and signs involving cognitive functions and awareness: Secondary | ICD-10-CM | POA: Diagnosis not present

## 2023-12-27 DIAGNOSIS — Z741 Need for assistance with personal care: Secondary | ICD-10-CM | POA: Diagnosis not present

## 2023-12-27 DIAGNOSIS — F0390 Unspecified dementia without behavioral disturbance: Secondary | ICD-10-CM | POA: Diagnosis not present

## 2023-12-27 DIAGNOSIS — K589 Irritable bowel syndrome without diarrhea: Secondary | ICD-10-CM | POA: Diagnosis not present

## 2024-01-04 DIAGNOSIS — Z741 Need for assistance with personal care: Secondary | ICD-10-CM | POA: Diagnosis not present

## 2024-01-04 DIAGNOSIS — K589 Irritable bowel syndrome without diarrhea: Secondary | ICD-10-CM | POA: Diagnosis not present

## 2024-01-04 DIAGNOSIS — F0390 Unspecified dementia without behavioral disturbance: Secondary | ICD-10-CM | POA: Diagnosis not present

## 2024-01-04 DIAGNOSIS — R278 Other lack of coordination: Secondary | ICD-10-CM | POA: Diagnosis not present

## 2024-01-04 DIAGNOSIS — R4189 Other symptoms and signs involving cognitive functions and awareness: Secondary | ICD-10-CM | POA: Diagnosis not present

## 2024-01-12 ENCOUNTER — Ambulatory Visit: Payer: Self-pay | Admitting: Gastroenterology

## 2024-01-12 ENCOUNTER — Telehealth: Payer: Self-pay

## 2024-01-12 ENCOUNTER — Encounter: Payer: Self-pay | Admitting: Gastroenterology

## 2024-01-12 ENCOUNTER — Other Ambulatory Visit

## 2024-01-12 ENCOUNTER — Ambulatory Visit: Admitting: Gastroenterology

## 2024-01-12 VITALS — BP 118/68 | HR 83 | Ht 64.0 in | Wt 155.0 lb

## 2024-01-12 DIAGNOSIS — D649 Anemia, unspecified: Secondary | ICD-10-CM | POA: Diagnosis not present

## 2024-01-12 DIAGNOSIS — K921 Melena: Secondary | ICD-10-CM | POA: Diagnosis not present

## 2024-01-12 DIAGNOSIS — Z7901 Long term (current) use of anticoagulants: Secondary | ICD-10-CM | POA: Diagnosis not present

## 2024-01-12 DIAGNOSIS — D509 Iron deficiency anemia, unspecified: Secondary | ICD-10-CM | POA: Insufficient documentation

## 2024-01-12 LAB — CBC WITH DIFFERENTIAL/PLATELET
Basophils Absolute: 0.1 K/uL (ref 0.0–0.1)
Basophils Relative: 1.9 % (ref 0.0–3.0)
Eosinophils Absolute: 0.2 K/uL (ref 0.0–0.7)
Eosinophils Relative: 3.6 % (ref 0.0–5.0)
HCT: 29.1 % — ABNORMAL LOW (ref 36.0–46.0)
Hemoglobin: 8.8 g/dL — ABNORMAL LOW (ref 12.0–15.0)
Lymphocytes Relative: 31.7 % (ref 12.0–46.0)
Lymphs Abs: 2 K/uL (ref 0.7–4.0)
MCHC: 30.2 g/dL (ref 30.0–36.0)
MCV: 67.6 fl — ABNORMAL LOW (ref 78.0–100.0)
Monocytes Absolute: 0.8 K/uL (ref 0.1–1.0)
Monocytes Relative: 13.2 % — ABNORMAL HIGH (ref 3.0–12.0)
Neutro Abs: 3.1 K/uL (ref 1.4–7.7)
Neutrophils Relative %: 49.6 % (ref 43.0–77.0)
Platelets: 416 K/uL — ABNORMAL HIGH (ref 150.0–400.0)
RBC: 4.3 Mil/uL (ref 3.87–5.11)
RDW: 18.7 % — ABNORMAL HIGH (ref 11.5–15.5)
WBC: 6.3 K/uL (ref 4.0–10.5)

## 2024-01-12 LAB — IBC + FERRITIN
Ferritin: 4.6 ng/mL — ABNORMAL LOW (ref 10.0–291.0)
Iron: 10 ug/dL — ABNORMAL LOW (ref 42–145)
Saturation Ratios: 1.8 % — ABNORMAL LOW (ref 20.0–50.0)
TIBC: 543.2 ug/dL — ABNORMAL HIGH (ref 250.0–450.0)
Transferrin: 388 mg/dL — ABNORMAL HIGH (ref 212.0–360.0)

## 2024-01-12 LAB — B12 AND FOLATE PANEL
Folate: 13.4 ng/mL (ref >5.9)
Vitamin B-12: 373 pg/mL (ref 211–911)

## 2024-01-12 NOTE — Patient Instructions (Signed)
 Your provider has requested that you go to the basement level for lab work before leaving today. Press B on the elevator. The lab is located at the first door on the left as you exit the elevator.  _______________________________________________________  If your blood pressure at your visit was 140/90 or greater, please contact your primary care physician to follow up on this.  _______________________________________________________  If you are age 88 or older, your body mass index should be between 23-30. Your Body mass index is 26.61 kg/m. If this is out of the aforementioned range listed, please consider follow up with your Primary Care Provider.  If you are age 63 or younger, your body mass index should be between 19-25. Your Body mass index is 26.61 kg/m. If this is out of the aformentioned range listed, please consider follow up with your Primary Care Provider.   ________________________________________________________  The [facility] providers would like to encourage you to use MYCHART to communicate with providers for non-urgent requests or questions.  Due to long hold times on the telephone, sending your provider a message by [facility] may be a faster and more efficient way to get a response.  Please allow 48 business hours for a response.  Please remember that this is for non-urgent requests.  _______________________________________________________  [facility] is using a team-based approach to care.  Your team is made up of your doctor and two to three APPS. Our APPS (Nurse Practitioners and Physician Assistants) work with your physician to ensure care continuity for you. They are fully qualified to address your health concerns and develop a treatment plan. They communicate directly with your gastroenterologist to care for you. Seeing the Advanced Practice Practitioners on your physician's team can help you by facilitating care more promptly, often allowing for earlier  appointments, access to diagnostic testing, procedures, and other specialty referrals.

## 2024-01-12 NOTE — Addendum Note (Signed)
 Addended by: Arretta Toenjes D on: 01/12/2024 03:53 PM   Modules accepted: Orders

## 2024-01-12 NOTE — Telephone Encounter (Signed)
 Ariel Phillips, patient will be scheduled as soon as possible.  Auth Submission: NO AUTH NEEDED Site of care: Site of care: [facility] Payer: Medicare A/B with BCBS FEP Medication & CPT/J Code(s) submitted: Feraheme (ferumoxytol) [id number] Diagnosis Code:  Route of submission (phone, fax, portal):  Phone # Fax # Auth type: Buy/Bill PB Units/visits requested: 510mg  x 2 doses Reference number:  Approval from: 01/12/24 to 04/12/24

## 2024-01-12 NOTE — Progress Notes (Signed)
 01/12/2024 Ariel Phillips [id number] 09/18/1932   HISTORY OF PRESENT ILLNESS: This is an 89 year old female who is a patient of Dr. Darilyn.  She was last seen here in 2022.  Previously/historically had complained of diarrhea thought to be IBS related versus SIBO versus bile salt related from previous cholecystectomy.  She is actually here today for evaluation of anemia.  Hemoglobin has consistently down trended from 14.5 g in October 2024 down to most recent of 8.4 g in July 2025.  MCV is low.  No iron studies, etc. checked.  She now reports small pebble-like stools that are black in color.  There is oxygen on her old medication list but patient denies being on oxygen, does not recall ever being on oxygen.  It appears that she does have some at least mild dementia while discussing with her today.  There is a staff member from the facility with her, but she does not seem to know the patient well.  Colonoscopy 08/2016:  - Decreased sphincter tone found on digital rectal exam. - One 8 mm polyp in the transverse colon, removed with a cold snare. Resected and retrieved. - The examination was otherwise normal.   Past Medical History:  Diagnosis Date   Allergic asthma    Allergic rhinitis due to pollen    Allergy    GERD (gastroesophageal reflux disease)    IBS (irritable bowel syndrome)    Osteopenia    Unspecified hypothyroidism    Past Surgical History:  Procedure Laterality Date   ANAL FISSURE REPAIR  1950's   BASAL CELL CARCINOMA EXCISION  2000's   nose   CHOLECYSTECTOMY  2008   COLONOSCOPY  Multiple   Negative screening exams   ENDOSCOPIC RETROGRADE CHOLANGIOPANCREATOGRAPHY (ERCP) WITH PROPOFOL  N/A 02/13/2019   Procedure: ENDOSCOPIC RETROGRADE CHOLANGIOPANCREATOGRAPHY (ERCP) WITH PROPOFOL ;  Surgeon: Avram Lupita BRAVO, MD;  Location: [facility];  Service: Endoscopy;  Laterality: N/A;   HEMORRHOID SURGERY  1953 or so   with fissure repair   ORIF ANKLE FRACTURE Right 06/27/2020    Procedure: OPEN REDUCTION INTERNAL FIXATION (ORIF) ANKLE FRACTURE;  Surgeon: Lennie Barter, DPM;  Location: [facility];  Service: Podiatry;  Laterality: Right;   REMOVAL OF STONES  02/13/2019   Procedure: REMOVAL OF STONES;  Surgeon: Avram Lupita BRAVO, MD;  Location: [facility];  Service: Endoscopy;;   SPHINCTEROTOMY  02/13/2019   Procedure: ANNETT;  Surgeon: Avram Lupita BRAVO, MD;  Location: [facility];  Service: Endoscopy;;   TUBAL LIGATION      reports that she has never smoked. She has been exposed to tobacco smoke. She has never used smokeless tobacco. She reports current alcohol  use. She reports that she does not use drugs. family history includes COPD in her mother; Cancer in her mother and paternal grandmother; Diabetes in her sister; Hypertension in her sister; Stroke in an other family member; Transient ischemic attack in her mother. Allergies  Allergen Reactions   Grass Pollen(K-O-R-T-Swt Vern) Other (See Comments)   Penicillins Other (See Comments)    Did it involve swelling of the face/tongue/throat, SOB, or low BP? No Did it involve sudden or severe rash/hives, skin peeling, or any reaction on the inside of your mouth or nose? No Did you need to seek medical attention at a hospital or doctor's office? Yes When did it last happen?      20+ years If all above answers are NO, may proceed with cephalosporin use.  Pt does not remember the reaction   Latex  Rash      Outpatient Encounter Medications as of 01/12/2024  Medication Sig   acetaminophen  (TYLENOL ) 325 MG tablet Take 650 mg by mouth every 4 (four) hours as needed.   albuterol  (VENTOLIN  HFA) 108 (90 Base) MCG/ACT inhaler Inhale 2 puffs into the lungs 3 (three) times daily as needed.   aluminum-magnesium hydroxide 200-200 MG/5ML suspension Take 10 mLs by mouth every 6 (six) hours as needed for indigestion.   amiodarone  (PACERONE ) 200 MG tablet Take 1 tablet (200 mg total) by mouth daily. TAKE ONE TABLET (200 MG)  BY MOUTH EVERY DAY   apixaban  (ELIQUIS ) 5 MG TABS tablet Take 1 tablet (5 mg total) by mouth 2 (two) times daily.   bismuth subsalicylate (PEPTO BISMOL) 262 MG/15ML suspension Take 30 mLs by mouth as needed.   carbamide peroxide (DEBROX) 6.5 % OTIC solution Place 5 drops into both ears at bedtime.   carboxymethylcellulose (REFRESH PLUS) 0.5 % SOLN Place 2 drops into both eyes 3 (three) times daily as needed.   cetirizine (ZYRTEC) 10 MG chewable tablet Chew 10 mg by mouth daily.   dextromethorphan-guaiFENesin  (ROBITUSSIN-DM) 10-100 MG/5ML liquid Take 5 mLs by mouth every 4 (four) hours as needed for cough.   diclofenac  Sodium (VOLTAREN ) 1 % GEL Apply topically as needed.   Glucose 15 GM/32ML GEL One Packet by mouth as needed.   levothyroxine  (SYNTHROID ) 112 MCG tablet Take 1 tablet (112 mcg total) by mouth daily before breakfast.   Loratadine 10 MG CAPS Take 1 capsule by mouth at bedtime.   magnesium hydroxide (MILK OF MAGNESIA) 400 MG/5ML suspension Take 5 mLs by mouth daily as needed for mild constipation.   nystatin (MYCOSTATIN/NYSTOP) powder Apply 1 Application topically 2 (two) times daily.   omeprazole  (PRILOSEC) 20 MG capsule Take 20 mg by mouth daily.   ondansetron  (ZOFRAN ) 4 MG tablet Take 4 mg by mouth every 8 (eight) hours as needed for nausea or vomiting.   OXYGEN Inhale into the lungs. 2lpm   polyethylene glycol (MIRALAX / GLYCOLAX) 17 g packet Take 17 g by mouth daily. Mon, Wed and Friday   Wheat Dextrin (BENEFIBER) POWD Take 1 Dose by mouth daily.   No facility-administered encounter medications on file as of 01/12/2024.    REVIEW OF SYSTEMS  : All other systems reviewed and negative except where noted in the History of Present Illness.   PHYSICAL EXAM: BP 118/68   Pulse 83   Ht 5' 4 (1.626 m)   Wt 155 lb (70.3 kg)   BMI 26.61 kg/m  General: Well developed white female in no acute distress Head: Normocephalic and atraumatic Eyes:  Sclerae anicteric, conjunctiva  pink. Ears: Normal auditory acuity Lungs: Clear throughout to auscultation; no W/R/R. Heart: Regular rate and rhythm; no M/R/G. Abdomen: Soft, non-distended.  BS present.  Non-tender. Musculoskeletal: Symmetrical with no gross deformities  Skin: No lesions on visible extremities Extremities: No edema  Neurological: Alert oriented x 4, grossly non-focal Psychological:  Alert and cooperative. Normal mood and affect  ASSESSMENT AND PLAN: *Anemia: Hemoglobin has consistently down trended from 14.5 g in October 2024 down to most recent of 8.4 g in July 2025.  MCV is low.  No iron studies, etc. checked, but suspect iron deficiency.  She reports small pebble-like stools that are black in color.  Will check a CBC again today along with iron studies, B12, and folate levels.  She is likely going to need both an EGD and colonoscopy.  There is oxygen on her old  medication list but patient denies being on oxygen, does not recall ever being on oxygen.  We have reached out to her living facility to confirm this before scheduling procedures.  This will determine where she needs to have these performed. *Chronic anticoagulation with Eliquis  for history of atrial fribrillation:  Will hold Eliquis  for 2 days prior to endoscopic procedures - will instruct when and how to resume after procedure. Benefits and risks of procedure explained including risks of bleeding, perforation, infection, missed lesions, reactions to medications and possible need for hospitalization and surgery for complications. Additional rare but real risk of stroke or other vascular clotting events off of Eliquis  also explained and need to seek urgent help if any signs of these problems occur. Will communicate by phone or EMR with patient's prescribing provider to confirm that holding Eliquis  is reasonable in this case.     CC:  Abdul Fine, MD

## 2024-01-14 NOTE — Progress Notes (Signed)
 Electrophysiology Clinic Note    Date:  01/15/2024  Patient ID:  Ariel Phillips 25-Mar-1933, MRN [id number] PCP:  Abdul Fine, MD  Cardiologist:  Redell Cave, MD   Electrophysiologist:  OLE ONEIDA HOLTS, MD  Electrophysiology APP:  Sheriff Rodenberg, NP    Discussed the use of AI scribe software for clinical note transcription with the patient, who gave verbal consent to proceed.   Patient Profile   Chief Complaint: Afib follow-up  History of Present Illness: Ariel Phillips is a 89 y.o. female with PMH notable for persis Afib, non-obs CAD, hypothyroid, MCI; seen today for OLE ONEIDA HOLTS, MD for routine electrophysiology followup.   Afib initially diagnosed 2023 by EMS with c/o palpitations. She was started on amiodarone /eliquis .  She last saw Dr. HOLTS 05/2023 at which time she was doing well without complaints.   She saw NP [place] 10/2023 where she c/o dark stools. Labwork with reduced HgB, also hypothyroid. Amio reduced to 100mg  daily and rec to follow-up with PCP.  On follow-up today, she is not aware of any AFib episodes. Facility manages medications. She saw GI last week, but does not recall appointment. Planning for colonoscopy at the end of October to eval dark stools. Per patietn, she does continue to have dark stools. She denies frank blood in toilet, or blood with urination.       Arrhythmia/Device History Amiodarone     ROS:  Please see the history of present illness. All other systems are reviewed and otherwise negative.    Physical Exam    VS:  BP (!) 120/59 (BP Location: Left Arm, Patient Position: Sitting, Cuff Size: Normal)   Pulse 80   Ht 5' 6 (1.676 m)   Wt 155 lb 9.6 oz (70.6 kg)   SpO2 98%   BMI 25.11 kg/m  BMI: Body mass index is 25.11 kg/m.      Wt Readings from Last 3 Encounters:  01/15/24 155 lb 9.6 oz (70.6 kg)  01/12/24 155 lb (70.3 kg)  11/17/23 155 lb (70.3 kg)     GEN- The patient is well appearing,  alert Lungs- Clear to ausculation bilaterally, normal work of breathing.  Heart- Regular rate and rhythm, no murmurs, rubs or gallops Extremities- No peripheral edema, warm, dry   Studies Reviewed   Previous EP, cardiology notes.    EKG is ordered. Personal review of EKG from today shows:    EKG Interpretation Date/Time:  Monday January 15 2024 10:03:54 EDT Ventricular Rate:  80 PR Interval:  202 QRS Duration:  90 QT Interval:  398 QTC Calculation: 459 R Axis:   40  Text Interpretation: Normal sinus rhythm Normal ECG Confirmed by Sheriece Jefcoat [phone]) on 01/15/2024 10:08:47 AM    TTE, 03/22/2022  1. Left ventricular ejection fraction, by estimation, is 60 to 65%. The left ventricle has normal function. The left ventricle has no regional wall motion abnormalities. There is mild left ventricular hypertrophy of the basal-septal segment. Left ventricular diastolic parameters are consistent with Grade I diastolic dysfunction (impaired relaxation). The average left ventricular global longitudinal strain is -17.9 %.   2. Right ventricular systolic function is normal. The right ventricular size is normal.   3. The mitral valve is normal in structure. Mild to moderate mitral valve regurgitation. No evidence of mitral stenosis.   4. The aortic valve is tricuspid. Aortic valve regurgitation is mild. No aortic stenosis is present.   5. There is borderline dilatation of the ascending aorta, measuring 39  mm.   6. The inferior vena cava is normal in size with greater than 50% respiratory variability, suggesting right atrial pressure of 3 mmHg.   Comparison(s): 07/11/19-EF 60-65%.   CT coronary, 07/21/2019 1. Coronary calcium score of 200. This was 13 percentile for age and sex matched control. 2. Normal coronary origin with right dominance. 3. Atleast moderate stenosis of the proximal and mid to distal LAD.  4. CAD-RADS 3. Moderate stenosis. Consider symptom-guided anti-ischemic pharmacotherapy  as well as risk factor modification per guideline directed care. Additional analysis with CT FFR will be submitted.   Assessment and Plan     #) persis Afib #) amiodarone  monitoring Maintaining sinus rhythm on amiodarone  Amio dose remains at 200mg  daily Will reduce to 100mg  daily   #) Hypercoag d/t  afib CHA2DS2-VASc Score = at least 3 [CHF History: 0, HTN History: 0, Diabetes History: 0, Stroke History: 0, Vascular Disease History: 0, Age Score: 2, Gender Score: 1].  Therefore, the patient's annual risk of stroke is 3.2 %.    Stroke ppx - 5mg  eliquis  BID, appropriately dosed Continues to have dark stools GI eval last week, planning colonoscopy soon  #) acquired hypothyroid Reduce amio as above Recommend thyroid  labs in ~1 month to re-eval        Current medicines are reviewed at length with the patient today.   The patient does not have concerns regarding her medicines.  The following changes were made today:   REDUCE amiodarone  to 100mg  daily  Labs/ tests ordered today include:  Orders Placed This Encounter  Procedures   TSH   T4, free   EKG 12-Lead     Disposition: Follow up with EP APP in 3 months   Signed, Chantal Needle, NP  01/15/24  10:34 AM  Electrophysiology [facility]

## 2024-01-15 ENCOUNTER — Ambulatory Visit: Attending: Cardiology | Admitting: Cardiology

## 2024-01-15 ENCOUNTER — Encounter: Payer: Self-pay | Admitting: Cardiology

## 2024-01-15 VITALS — BP 120/59 | HR 80 | Ht 66.0 in | Wt 155.6 lb

## 2024-01-15 DIAGNOSIS — I4819 Other persistent atrial fibrillation: Secondary | ICD-10-CM | POA: Diagnosis not present

## 2024-01-15 DIAGNOSIS — D6869 Other thrombophilia: Secondary | ICD-10-CM | POA: Diagnosis not present

## 2024-01-15 DIAGNOSIS — Z79899 Other long term (current) drug therapy: Secondary | ICD-10-CM | POA: Diagnosis not present

## 2024-01-15 DIAGNOSIS — E039 Hypothyroidism, unspecified: Secondary | ICD-10-CM | POA: Diagnosis not present

## 2024-01-15 MED ORDER — AMIODARONE HCL 100 MG PO TABS: 100.0000 mg | ORAL_TABLET | Freq: Every day | ORAL | 3 refills | Status: AC

## 2024-01-15 NOTE — Patient Instructions (Signed)
 Medication Instructions:  Your physician recommends the following medication changes.  DECREASE: Amiodarone  to 100 mg once daily  *If you need a refill on your cardiac medications before your next appointment, please call your pharmacy*  Lab Work: Your provider would like for you to have following labs drawn today TSH, T4.   If you have labs (blood work) drawn today and your tests are completely normal, you will receive your results only by: MyChart Message (if you have MyChart) OR A paper copy in the mail If you have any lab test that is abnormal or we need to change your treatment, we will call you to review the results.  Testing/Procedures: None ordered at this time   Follow-Up: At [facility], you and your health needs are our priority.  As part of our continuing mission to provide you with exceptional heart care, our providers are all part of one team.  This team includes your primary Cardiologist (physician) and Advanced Practice Providers or APPs (Physician Assistants and Nurse Practitioners) who all work together to provide you with the care you need, when you need it.  Your next appointment:   3 month(s)  Provider:   Suzann Riddle, NP    We recommend signing up for the patient portal called MyChart.  Sign up information is provided on this After Visit Summary.  MyChart is used to connect with patients for Virtual Visits (Telemedicine).  Patients are able to view lab/test results, encounter notes, upcoming appointments, etc.  Non-urgent messages can be sent to your provider as well.   To learn more about what you can do with MyChart, go to [email or web address].

## 2024-01-16 ENCOUNTER — Ambulatory Visit: Payer: Self-pay | Admitting: Cardiology

## 2024-01-16 LAB — TSH: TSH: 8.14 u[IU]/mL — ABNORMAL HIGH (ref 0.450–4.500)

## 2024-01-16 LAB — T4, FREE: Free T4: 1.54 ng/dL (ref 0.82–1.77)

## 2024-01-18 MED ORDER — NA SULFATE-K SULFATE-MG SULF 17.5-3.13-1.6 GM/177ML PO SOLN: 1.0000 | Freq: Once | ORAL | 0 refills | Status: AC

## 2024-01-18 NOTE — Addendum Note (Signed)
 Addended by: WILL POWELL CROME on: 01/18/2024 10:12 AM   Modules accepted: Orders

## 2024-01-19 ENCOUNTER — Telehealth: Payer: Self-pay | Admitting: *Deleted

## 2024-01-19 NOTE — Telephone Encounter (Signed)
 [facility] Pre-operative Risk Assessment     Request for surgical clearance:     Endoscopy Procedure  What type of surgery is being performed?     EGD/Colonoscopy  When is this surgery scheduled?     02/27/24  What type of clearance is required ?   Pharmacy  Are there any medications that need to be held prior to surgery and how long? Eliquis  2 days  Practice name and name of physician performing surgery?      [facility]  What is your office phone and fax number?      Phone- [phone]  Fax- [phone]  Anesthesia type (None, local, MAC, general) ?       MAC   Please route your response to Powell Misty, CMA

## 2024-01-22 ENCOUNTER — Ambulatory Visit (INDEPENDENT_AMBULATORY_CARE_PROVIDER_SITE_OTHER)

## 2024-01-22 VITALS — BP 123/72 | HR 77 | Temp 98.0°F | Resp 18 | Ht 64.0 in | Wt 154.6 lb

## 2024-01-22 DIAGNOSIS — D509 Iron deficiency anemia, unspecified: Secondary | ICD-10-CM

## 2024-01-22 MED ORDER — SODIUM CHLORIDE 0.9 % IV SOLN
510.0000 mg | Freq: Once | INTRAVENOUS | Status: AC
  Administered 2024-01-22: 510 mg via INTRAVENOUS
  Filled 2024-01-22: qty 17

## 2024-01-22 NOTE — Patient Instructions (Signed)

## 2024-01-22 NOTE — Telephone Encounter (Signed)
 Left message for Maude, Charity fundraiser at [facility].

## 2024-01-22 NOTE — Telephone Encounter (Signed)
   Patient Name: Ariel Phillips  DOB: 06/02/1932 MRN: [id number]  Primary Cardiologist: Redell Cave, MD  Chart reviewed as part of pre-operative protocol coverage. Patient is scheduled for endoscopy/ colonoscopy on 02/27/2024. Cardiology was asked to give recommendations for holding Eliquis . Per Pharmacy and office protocol, patient can hold Eliquis  for 2 days prior to procedure. Please resume as soon as safely possible afterwards.   I will route this recommendation to the requesting party via Epic fax function and remove from pre-op pool.  Please call with questions.  Gemini Bunte E Itay Mella, PA-C 01/22/2024, 10:20 AM

## 2024-01-22 NOTE — Telephone Encounter (Signed)
 Patient with diagnosis of atrial fibrilation  on Eliquis   for anticoagulation.    Procedure: EGD/Colonoscopy  Date of procedure: 02/27/2024   CHA2DS2-VASc Score = 3   This indicates a 3.2% annual risk of stroke. The patient's score is based upon: CHF History: 0 HTN History: 0 Diabetes History: 0 Stroke History: 0 Vascular Disease History: 0 Age Score: 2 Gender Score: 1    CrCl 46 mL/min  Platelet count 416 K   Patient has not  had an Afib/aflutter ablation or Watchman within the last 3 months or DCCV within the last 30 days    Per office protocol, patient can hold Eliquis  for 2 days prior to procedure.     **This guidance is not considered finalized until pre-operative APP has relayed final recommendations.**

## 2024-01-22 NOTE — Telephone Encounter (Signed)
 Informed Maude, RN at [facility] for patient to hold Eliquis .

## 2024-01-22 NOTE — Progress Notes (Signed)
 Diagnosis: Iron Deficiency Anemia  Provider:  Praveen Mannam MD  Procedure: IV Infusion  IV Type: Peripheral, IV Location: L Antecubital  Feraheme (Ferumoxytol ), Dose: 510 mg  Infusion Start Time: 1016  Infusion Stop Time: 1034  Post Infusion IV Care: Observation period completed and Peripheral IV Discontinued  Discharge: Condition: Good, Destination: [facility] . AVS Provided  Performed by:  Maximiano JONELLE Pouch, LPN

## 2024-01-29 ENCOUNTER — Ambulatory Visit (INDEPENDENT_AMBULATORY_CARE_PROVIDER_SITE_OTHER)

## 2024-01-29 VITALS — BP 119/69 | HR 78 | Temp 97.6°F | Resp 18 | Ht 64.0 in | Wt 153.8 lb

## 2024-01-29 DIAGNOSIS — D509 Iron deficiency anemia, unspecified: Secondary | ICD-10-CM | POA: Diagnosis not present

## 2024-01-29 MED ORDER — SODIUM CHLORIDE 0.9 % IV SOLN
510.0000 mg | Freq: Once | INTRAVENOUS | Status: AC
  Administered 2024-01-29: 510 mg via INTRAVENOUS
  Filled 2024-01-29: qty 17

## 2024-01-29 NOTE — Progress Notes (Signed)
 Diagnosis: Iron Deficiency Anemia  Provider:  Praveen Mannam MD  Procedure: IV Infusion  IV Type: Peripheral, IV Location: L Forearm  Feraheme (Ferumoxytol ), Dose: 510 mg  Infusion Start Time: 1035  Infusion Stop Time: 1052  Post Infusion IV Care: Observation period completed and Peripheral IV Discontinued  Discharge: Condition: Good, Destination: Home . AVS Provided  Performed by:  Maximiano JONELLE Pouch, LPN

## 2024-02-13 DIAGNOSIS — R278 Other lack of coordination: Secondary | ICD-10-CM | POA: Diagnosis not present

## 2024-02-13 DIAGNOSIS — K589 Irritable bowel syndrome without diarrhea: Secondary | ICD-10-CM | POA: Diagnosis not present

## 2024-02-13 DIAGNOSIS — R4189 Other symptoms and signs involving cognitive functions and awareness: Secondary | ICD-10-CM | POA: Diagnosis not present

## 2024-02-13 DIAGNOSIS — F0390 Unspecified dementia without behavioral disturbance: Secondary | ICD-10-CM | POA: Diagnosis not present

## 2024-02-13 DIAGNOSIS — Z741 Need for assistance with personal care: Secondary | ICD-10-CM | POA: Diagnosis not present

## 2024-02-15 DIAGNOSIS — Z1331 Encounter for screening for depression: Secondary | ICD-10-CM | POA: Diagnosis not present

## 2024-02-15 DIAGNOSIS — R251 Tremor, unspecified: Secondary | ICD-10-CM | POA: Diagnosis not present

## 2024-02-15 DIAGNOSIS — F03A0 Unspecified dementia, mild, without behavioral disturbance, psychotic disturbance, mood disturbance, and anxiety: Secondary | ICD-10-CM | POA: Diagnosis not present

## 2024-02-19 DIAGNOSIS — Z741 Need for assistance with personal care: Secondary | ICD-10-CM | POA: Diagnosis not present

## 2024-02-19 DIAGNOSIS — K589 Irritable bowel syndrome without diarrhea: Secondary | ICD-10-CM | POA: Diagnosis not present

## 2024-02-19 DIAGNOSIS — R278 Other lack of coordination: Secondary | ICD-10-CM | POA: Diagnosis not present

## 2024-02-19 DIAGNOSIS — F0390 Unspecified dementia without behavioral disturbance: Secondary | ICD-10-CM | POA: Diagnosis not present

## 2024-02-19 DIAGNOSIS — R4189 Other symptoms and signs involving cognitive functions and awareness: Secondary | ICD-10-CM | POA: Diagnosis not present

## 2024-02-26 ENCOUNTER — Encounter: Payer: Self-pay | Admitting: Internal Medicine

## 2024-02-26 NOTE — Progress Notes (Unsigned)
 [facility] History and Physical   Primary Care Physician:  Laurence Locus, DO   Reason for Procedure:   Iron deficiency anemia  Plan:    EGD, colonoscopy     HPI: Ariel Phillips is a 89 y.o. female on Eliquis  (held) for PAF who had decrescing Hgb and iron deficiency anemia.  Lab Results  Component Value Date   IRON 10 (L) 01/12/2024   TIBC 543.2 (H) 01/12/2024   FERRITIN 4.6 (L) 01/12/2024       Latest Ref Rng & Units 01/12/2024   11:30 AM 10/13/2023    3:38 PM 08/28/2023   12:00 AM  CBC  WBC 4.0 - 10.5 K/uL 6.3  6.8  6.5      Hemoglobin 12.0 - 15.0 g/dL 8.8 Repeated and verified X2.  9.8  11.8      Hematocrit 36.0 - 46.0 % 29.1  32.4  39      Platelets 150.0 - 400.0 K/uL 416.0  424  407         This result is from an external source.   2018 colonoscopy w/ 8 mm hyperplastic polyp   Past Medical History:  Diagnosis Date   Allergic asthma    Allergic rhinitis due to pollen    Allergy    Dementia (HCC)    GERD (gastroesophageal reflux disease)    IBS (irritable bowel syndrome)    MCI (mild cognitive impairment) 12/02/2015   Osteopenia    Paroxysmal atrial fibrillation (HCC) 02/16/2022   Unspecified hypothyroidism     Past Surgical History:  Procedure Laterality Date   ANAL FISSURE REPAIR  1950's   BASAL CELL CARCINOMA EXCISION  2000's   nose   CHOLECYSTECTOMY  2008   COLONOSCOPY  Multiple   Negative screening exams   ENDOSCOPIC RETROGRADE CHOLANGIOPANCREATOGRAPHY (ERCP) WITH PROPOFOL  N/A 02/13/2019   Procedure: ENDOSCOPIC RETROGRADE CHOLANGIOPANCREATOGRAPHY (ERCP) WITH PROPOFOL ;  Surgeon: Avram Lupita BRAVO, MD;  Location: [facility];  Service: Endoscopy;  Laterality: N/A;   HEMORRHOID SURGERY  1953 or so   with fissure repair   ORIF ANKLE FRACTURE Right 06/27/2020   Procedure: OPEN REDUCTION INTERNAL FIXATION (ORIF) ANKLE FRACTURE;  Surgeon: Lennie Barter, DPM;  Location: [facility];  Service: Podiatry;  Laterality: Right;   REMOVAL OF STONES   02/13/2019   Procedure: REMOVAL OF STONES;  Surgeon: Avram Lupita BRAVO, MD;  Location: [facility];  Service: Endoscopy;;   SPHINCTEROTOMY  02/13/2019   Procedure: ANNETT;  Surgeon: Avram Lupita BRAVO, MD;  Location: [facility];  Service: Endoscopy;;   TUBAL LIGATION       Current Outpatient Medications  Medication Sig Dispense Refill   amiodarone  (PACERONE ) 100 MG tablet Take 1 tablet (100 mg total) by mouth daily. TAKE ONE TABLET (200 MG) BY MOUTH EVERY DAY 90 tablet 3   levothyroxine  (SYNTHROID ) 112 MCG tablet Take 1 tablet (112 mcg total) by mouth daily before breakfast.     Loratadine 10 MG CAPS Take 1 capsule by mouth at bedtime.     omeprazole  (PRILOSEC) 20 MG capsule Take 20 mg by mouth daily.     acetaminophen  (TYLENOL ) 325 MG tablet Take 650 mg by mouth every 4 (four) hours as needed.     albuterol  (VENTOLIN  HFA) 108 (90 Base) MCG/ACT inhaler Inhale 2 puffs into the lungs 3 (three) times daily as needed.     aluminum-magnesium hydroxide 200-200 MG/5ML suspension Take 10 mLs by mouth every 6 (six) hours as needed for indigestion.     apixaban  (ELIQUIS )  5 MG TABS tablet Take 1 tablet (5 mg total) by mouth 2 (two) times daily. 60 tablet 5   bismuth subsalicylate (PEPTO BISMOL) 262 MG/15ML suspension Take 30 mLs by mouth as needed.     carbamide peroxide (DEBROX) 6.5 % OTIC solution Place 5 drops into both ears at bedtime.     carboxymethylcellulose (REFRESH PLUS) 0.5 % SOLN Place 2 drops into both eyes 3 (three) times daily as needed.     cetirizine (ZYRTEC) 10 MG chewable tablet Chew 10 mg by mouth daily.     dextromethorphan-guaiFENesin  (ROBITUSSIN-DM) 10-100 MG/5ML liquid Take 5 mLs by mouth every 4 (four) hours as needed for cough.     diclofenac  Sodium (VOLTAREN ) 1 % GEL Apply topically as needed.     Glucose 15 GM/32ML GEL One Packet by mouth as needed.     magnesium hydroxide (MILK OF MAGNESIA) 400 MG/5ML suspension Take 5 mLs by mouth daily as needed for mild  constipation.     nystatin (MYCOSTATIN/NYSTOP) powder Apply 1 Application topically 2 (two) times daily.     ondansetron  (ZOFRAN ) 4 MG tablet Take 4 mg by mouth every 8 (eight) hours as needed for nausea or vomiting.     OXYGEN Inhale into the lungs. 2lpm     polyethylene glycol (MIRALAX / GLYCOLAX) 17 g packet Take 17 g by mouth daily. Mon, Wed and Friday     Wheat Dextrin (BENEFIBER) POWD Take 1 Dose by mouth daily.     Current Facility-Administered Medications  Medication Dose Route Frequency Provider Last Rate Last Admin   0.9 %  sodium chloride  infusion  500 mL Intravenous Once Avram Lupita BRAVO, MD        Allergies as of 02/27/2024 - Review Complete 02/27/2024  Allergen Reaction Noted   Penicillins Other (See Comments) 05/22/2015   Grass pollen(k-o-r-t-swt vern) Other (See Comments) 04/11/2022   Latex Rash 07/24/2015    Family History  Problem Relation Age of Onset   COPD Mother    Cancer Mother        unclear diagnosis   Transient ischemic attack Mother    Diabetes Sister    Hypertension Sister    Stroke Other    Cancer Paternal Grandmother        ?breast   Heart disease Neg Hx     Social History   Socioeconomic History   Marital status: Divorced    Spouse name: Not on file   Number of children: 1   Years of education: Not on file   Highest education level: Not on file  Occupational History   Occupation: Retired historian--consultant  Tobacco Use   Smoking status: Never    Passive exposure: Past   Smokeless tobacco: Never  Vaping Use   Vaping status: Never Used  Substance and Sexual Activity   Alcohol  use: Yes    Comment: rare wine   Drug use: No   Sexual activity: Not Currently  Other Topics Concern   Not on file  Social History Narrative   Divorced and retired , moved from [place], [place]    1 Daughter in [place]   Has living will      Daughter has health care POA.---Ariel Phillips   Requests DNR--order done 06/19/12   Would not want  prolonged mechanical ventilation   May accept tube feeds temporarily but not if cognitively unaware   Social Drivers of Health   Financial Resource Strain: Low Risk  (07/14/2021)   Overall Financial Resource Strain (CARDIA)  Difficulty of Paying Living Expenses: Not hard at all  Food Insecurity: No Food Insecurity (03/16/2022)   Hunger Vital Sign    Worried About Running Out of Food in the Last Year: Never true    Ran Out of Food in the Last Year: Never true  Transportation Needs: No Transportation Needs (03/16/2022)   PRAPARE - Administrator, Civil Service (Medical): No    Lack of Transportation (Non-Medical): No  Physical Activity: Not on file  Stress: Not on file  Social Connections: Not on file  Intimate Partner Violence: Not on file    Review of Systems:  All other review of systems negative except as mentioned in the HPI.  Physical Exam: Vital signs BP (!) 155/76   Pulse 77   Temp (!) 97.2 F (36.2 C) (Temporal)   Ht 5' 6 (1.676 m)   Wt 155 lb (70.3 kg)   SpO2 97%   BMI 25.02 kg/m   General:   Alert,  Well-developed, well-nourished, pleasant and cooperative in NAD Lungs:  Clear throughout to auscultation.   Heart:  Regular rate and rhythm; no murmurs, clicks, rubs,  or gallops. Abdomen:  Soft, nontender and nondistended. Normal bowel sounds.   Neuro/Psych:  Alert and cooperative. Normal mood and affect.Understands that she is having EGD and colonoscopy   @Faigy Stretch  CHARLENA Commander, MD, [facility] [phone] (pager) 02/27/2024 11:21 AM@

## 2024-02-27 ENCOUNTER — Ambulatory Visit: Admitting: Internal Medicine

## 2024-02-27 ENCOUNTER — Encounter: Payer: Self-pay | Admitting: Internal Medicine

## 2024-02-27 VITALS — BP 106/50 | HR 81 | Temp 97.2°F | Resp 18 | Ht 66.0 in | Wt 155.0 lb

## 2024-02-27 DIAGNOSIS — F0390 Unspecified dementia without behavioral disturbance: Secondary | ICD-10-CM | POA: Diagnosis not present

## 2024-02-27 DIAGNOSIS — K562 Volvulus: Secondary | ICD-10-CM | POA: Diagnosis not present

## 2024-02-27 DIAGNOSIS — K2970 Gastritis, unspecified, without bleeding: Secondary | ICD-10-CM

## 2024-02-27 DIAGNOSIS — K2950 Unspecified chronic gastritis without bleeding: Secondary | ICD-10-CM

## 2024-02-27 DIAGNOSIS — K3189 Other diseases of stomach and duodenum: Secondary | ICD-10-CM

## 2024-02-27 DIAGNOSIS — K5520 Angiodysplasia of colon without hemorrhage: Secondary | ICD-10-CM | POA: Diagnosis not present

## 2024-02-27 DIAGNOSIS — D500 Iron deficiency anemia secondary to blood loss (chronic): Secondary | ICD-10-CM

## 2024-02-27 DIAGNOSIS — I480 Paroxysmal atrial fibrillation: Secondary | ICD-10-CM | POA: Diagnosis not present

## 2024-02-27 DIAGNOSIS — K6289 Other specified diseases of anus and rectum: Secondary | ICD-10-CM | POA: Diagnosis not present

## 2024-02-27 DIAGNOSIS — E039 Hypothyroidism, unspecified: Secondary | ICD-10-CM | POA: Diagnosis not present

## 2024-02-27 DIAGNOSIS — D509 Iron deficiency anemia, unspecified: Secondary | ICD-10-CM

## 2024-02-27 MED ORDER — FERROUS SULFATE 325 (65 FE) MG PO TBEC: DELAYED_RELEASE_TABLET | ORAL | 3 refills | Status: DC

## 2024-02-27 MED ORDER — FERROUS SULFATE 325 (65 FE) MG PO TABS: 325.0000 mg | ORAL_TABLET | Freq: Every day | ORAL | 3 refills | Status: DC

## 2024-02-27 MED ORDER — SODIUM CHLORIDE 0.9 % IV SOLN: 500.0000 mL | Freq: Once | INTRAVENOUS | Status: DC

## 2024-02-27 NOTE — Patient Instructions (Addendum)
 Resume previous diet. Continue present medications. Resume Eliquis  (apixaban ) at prior dose tomorrow. Ferrous sulfate 325 mg every day indefinitely.  YOU HAD AN ENDOSCOPIC PROCEDURE TODAY AT THE [facility]:   Refer to the procedure report that was given to you for any specific questions about what was found during the examination.  If the procedure report does not answer your questions, please call your gastroenterologist to clarify.  If you requested that your care partner not be given the details of your procedure findings, then the procedure report has been included in a sealed envelope for you to review at your convenience later.  YOU SHOULD EXPECT: Some feelings of bloating in the abdomen. Passage of more gas than usual.  Walking can help get rid of the air that was put into your GI tract during the procedure and reduce the bloating. If you had a lower endoscopy (such as a colonoscopy or flexible sigmoidoscopy) you may notice spotting of blood in your stool or on the toilet paper. If you underwent a bowel prep for your procedure, you may not have a normal bowel movement for a few days.  Please Note:  You might notice some irritation and congestion in your nose or some drainage.  This is from the oxygen used during your procedure.  There is no need for concern and it should clear up in a day or so.  SYMPTOMS TO REPORT IMMEDIATELY:  Following lower endoscopy (colonoscopy or flexible sigmoidoscopy):  Excessive amounts of blood in the stool  Significant tenderness or worsening of abdominal pains  Swelling of the abdomen that is new, acute  Fever of 100F or higher  Following upper endoscopy (EGD)  Vomiting of blood or coffee ground material  New chest pain or pain under the shoulder blades  Painful or persistently difficult swallowing  New shortness of breath  Fever of 100F or higher  Black, tarry-looking stools  For urgent or emergent issues, a gastroenterologist can be  reached at any hour by calling [phone]. Do not use MyChart messaging for urgent concerns.    DIET:  We do recommend a small meal at first, but then you may proceed to your regular diet.  Drink plenty of fluids but you should avoid alcoholic beverages for 24 hours.  ACTIVITY:  You should plan to take it easy for the rest of today and you should NOT DRIVE or use heavy machinery until tomorrow (because of the sedation medicines used during the test).    FOLLOW UP: Our staff will call the number listed on your records the next business day following your procedure.  We will call around 7:15- 8:00 am to check on you and address any questions or concerns that you may have regarding the information given to you following your procedure. If we do not reach you, we will leave a message.     If any biopsies were taken you will be contacted by phone or by letter within the next 1-3 weeks.  Please call us  at [phone] if you have not heard about the biopsies in 3 weeks.    SIGNATURES/CONFIDENTIALITY: You and/or your care partner have signed paperwork which will be entered into your electronic medical record.  These signatures attest to the fact that that the information above on your After Visit Summary has been reviewed and is understood.  Full responsibility of the confidentiality of this discharge information lies with you and/or your care-partner.Stomach was inflamed we call that gastritis and I took biopsies.  I found blood vessels on the surface of the colon lining called AVMs or angiodysplasia in these are most likely leaking blood.  We are starting iron supplements.  Restart Eliquis  tomorrow.  I appreciate the opportunity to care for you. Ariel CHARLENA Commander, MD, [facility]

## 2024-02-27 NOTE — Op Note (Signed)
 Ariel Phillips Procedure Date: 02/27/2024 11:31 AM MRN: [id number] Endoscopist: Lupita FORBES Commander , MD, [id number] Age: 89 Referring MD:  Date of Birth: 1932-12-16 Gender: Female Account #: [id number] Procedure:                Upper GI endoscopy Indications:              Iron deficiency anemia secondary to chronic blood                            loss Medicines:                Monitored Anesthesia Care Procedure:                Pre-Anesthesia Assessment:                           - Prior to the procedure, a History and Physical                            was performed, and patient medications and                            allergies were reviewed. The patient's tolerance of                            previous anesthesia was also reviewed. The risks                            and benefits of the procedure and the sedation                            options and risks were discussed with the patient.                            All questions were answered, and informed consent                            was obtained. Prior Anticoagulants: The patient has                            taken Eliquis  (apixaban ), last dose was 2 days                            prior to procedure. ASA Grade Assessment: III - A                            patient with severe systemic disease. After                            reviewing the risks and benefits, the patient was                            deemed in satisfactory condition to undergo the  procedure.                           After obtaining informed consent, the endoscope was                            passed under direct vision. Throughout the                            procedure, the patient's blood pressure, pulse, and                            oxygen saturations were monitored continuously. The                            GIF [id number] #[id number] was introduced through the                             mouth, and advanced to the second part of duodenum.                            The upper GI endoscopy was accomplished without                            difficulty. The patient tolerated the procedure                            well. Scope In: Scope Out: Findings:                 Severe inflammation characterized by erythema was                            found in the gastric antrum. Biopsies were taken                            with a cold forceps for histology. Verification of                            patient identification for the specimen was done.                            Estimated blood loss was minimal.                           The exam was otherwise without abnormality.                           The cardia and gastric fundus were normal on                            retroflexion. Complications:            No immediate complications. Estimated Blood Loss:     Estimated blood loss was minimal. Impression:               -  Chronic gastritis. Biopsied.                           - The examination was otherwise normal. Recommendation:           - Patient has a contact number available for                            emergencies. The signs and symptoms of potential                            delayed complications were discussed with the                            patient. Return to normal activities tomorrow.                            Written discharge instructions were provided to the                            patient.                           - Resume previous diet.                           - Continue present medications.                           - See the other procedure note for documentation of                            additional recommendations. Lupita FORBES Commander, MD 02/27/2024 12:14:14 PM This report has been signed electronically.

## 2024-02-27 NOTE — Progress Notes (Signed)
 Due to patient's dementia it was hard to know whether or not this history is accurate; care partner unsure

## 2024-02-27 NOTE — Progress Notes (Signed)
 Sedate, gd SR, tolerated procedure well, VSS, report to RN

## 2024-02-27 NOTE — Op Note (Signed)
 [facility] Patient Name: Ariel Phillips Procedure Date: 02/27/2024 11:24 AM MRN: [id number] Endoscopist: Lupita FORBES Commander , MD, [id number] Age: 89 Referring MD:  Date of Birth: 1932-08-01 Gender: Female Account #: [id number] Procedure:                Colonoscopy Indications:              Iron deficiency anemia Medicines:                Monitored Anesthesia Care Procedure:                Pre-Anesthesia Assessment:                           - Prior to the procedure, a History and Physical                            was performed, and patient medications and                            allergies were reviewed. The patient's tolerance of                            previous anesthesia was also reviewed. The risks                            and benefits of the procedure and the sedation                            options and risks were discussed with the patient.                            All questions were answered, and informed consent                            was obtained. Prior Anticoagulants: The patient                            last took Eliquis  (apixaban ) 2 days prior to the                            procedure. ASA Grade Assessment: III - A patient                            with severe systemic disease. After reviewing the                            risks and benefits, the patient was deemed in                            satisfactory condition to undergo the procedure.                           After obtaining informed consent, the colonoscope  was passed under direct vision. Throughout the                            procedure, the patient's blood pressure, pulse, and                            oxygen saturations were monitored continuously. The                            CF [id number] #[id number] was introduced through the anus                            and advanced to the the terminal ileum, with                            identification of the  appendiceal orifice and IC                            valve. The colonoscopy was somewhat difficult due                            to significant looping. Successful completion of                            the procedure was aided by applying abdominal                            pressure + abdominal binder. The patient tolerated                            the procedure well. The quality of the bowel                            preparation was good. The terminal ileum, ileocecal                            valve, appendiceal orifice, and rectum were                            photographed. Scope In: 11:44:20 AM Scope Out: 11:59:58 AM Scope Withdrawal Time: 0 hours 5 minutes 59 seconds  Total Procedure Duration: 0 hours 15 minutes 38 seconds  Findings:                 The digital rectal exam findings include decreased                            sphincter tone.                           A few small angiodysplastic lesions without                            bleeding were found in the cecum.  The terminal ileum appeared normal.                           The exam was otherwise without abnormality. Complications:            No immediate complications. Estimated Blood Loss:     Estimated blood loss: none. Impression:               - Decreased sphincter tone found on digital rectal                            exam.                           - A few non-bleeding colonic angiodysplastic                            lesions. I do suspect that these are a source of                            iron-deficiency aenmia as likely leaking blood,                            especially in setting of Eliquis  use                           - The examined portion of the ileum was normal.                           - The examination was otherwise normal.                           - No specimens collected. Recommendation:           - Patient has a contact number available for                             emergencies. The signs and symptoms of potential                            delayed complications were discussed with the                            patient. Return to normal activities tomorrow.                            Written discharge instructions were provided to the                            patient.                           - Resume previous diet.                           - Continue present medications.                           -  Resume Eliquis  (apixaban ) at prior dose tomorrow.                           - Ferrous sulfate 325 mg qd indefinitely (Rxed                            today)                           - Once I see gastric biopsy results will make a                            plan for follow-up and any additinal treatment.                            Could ablate the AVM's (colonoscopy at hospital)                            but would like to see if iron supplementation will                            treat her probvlems before attemptng that. Lupita FORBES Commander, MD 02/27/2024 12:19:41 PM This report has been signed electronically.

## 2024-02-28 ENCOUNTER — Telehealth: Payer: Self-pay | Admitting: *Deleted

## 2024-02-28 NOTE — Telephone Encounter (Signed)
  Follow up Call-     02/27/2024   10:14 AM  Call back number  Post procedure Call Back phone  # [phone]  Permission to leave phone message Yes   Left message to call back if any questions or concerns

## 2024-02-29 ENCOUNTER — Encounter: Payer: Self-pay | Admitting: Nurse Practitioner

## 2024-02-29 ENCOUNTER — Non-Acute Institutional Stay: Payer: Self-pay | Admitting: Nurse Practitioner

## 2024-02-29 DIAGNOSIS — N952 Postmenopausal atrophic vaginitis: Secondary | ICD-10-CM

## 2024-02-29 DIAGNOSIS — K580 Irritable bowel syndrome with diarrhea: Secondary | ICD-10-CM | POA: Diagnosis not present

## 2024-02-29 DIAGNOSIS — E039 Hypothyroidism, unspecified: Secondary | ICD-10-CM | POA: Diagnosis not present

## 2024-02-29 DIAGNOSIS — K219 Gastro-esophageal reflux disease without esophagitis: Secondary | ICD-10-CM

## 2024-02-29 DIAGNOSIS — I480 Paroxysmal atrial fibrillation: Secondary | ICD-10-CM | POA: Diagnosis not present

## 2024-02-29 DIAGNOSIS — D649 Anemia, unspecified: Secondary | ICD-10-CM | POA: Diagnosis not present

## 2024-02-29 DIAGNOSIS — F03B0 Unspecified dementia, moderate, without behavioral disturbance, psychotic disturbance, mood disturbance, and anxiety: Secondary | ICD-10-CM | POA: Diagnosis not present

## 2024-02-29 NOTE — Assessment & Plan Note (Signed)
 Stable, without current complaints at this time.

## 2024-02-29 NOTE — Assessment & Plan Note (Signed)
 Rate controlled. Continues on amiodarone  daily  Continues on eliquis  for anticoagulation.

## 2024-02-29 NOTE — Assessment & Plan Note (Signed)
 Add vit d 2000 units daily

## 2024-02-29 NOTE — Assessment & Plan Note (Signed)
Symptoms controlled on omeprazole.   

## 2024-02-29 NOTE — Progress Notes (Signed)
 Location:  Other Nursing Home Room Number: [place] [id number] Place of Service:  ALF (13)  Laurence Locus, DO  Patient Care Team: Laurence Locus, DO as PCP - General (Internal Medicine) Darliss Rogue, MD as PCP - Cardiology (Cardiology) Cindie Ole DASEN, MD as PCP - Electrophysiology (Clinical Cardiac Electrophysiology) Fate Morna SAILOR, [facility] (Inactive) as Pharmacist (Pharmacist) Riddle, Suzann, NP as Nurse Practitioner (Clinical Cardiac Electrophysiology)  Extended Emergency Contact Information Primary Emergency Contact: Maritza Rhoda [place], [place] Home Phone: [phone] Work Phone: [phone] Mobile Phone: [phone] Relation: Daughter  Goals of care: Advanced Directive information    02/29/2024   12:20 PM  Advanced Directives  Does Patient Have a Medical Advance Directive? Yes  Type of Estate Agent of [place];Living will;Out of facility DNR (pink MOST or yellow form)  Does patient want to make changes to medical advance directive? No - Patient declined  Copy of Healthcare Power of Attorney in Chart? Yes - validated most recent copy scanned in chart (See row information)     Chief Complaint  Patient presents with   Medical Management of Chronic Issues    Medical Management of Chronic Issues.     HPI:  Pt is a 89 y.o. female seen today for medical management of chronic disease.  The patient presents for a routine follow-up   She underwent a colonoscopy and endoscopy two days ago to investigate iron def anemia. hemoglobin levels, which were recorded at 8.8 g/dL last month. She does not recall the procedures. A few non-bleeding colonic angiodysplastic lesions were detected which was thought to be the source of her iron-deficiency aenmia as likely leaking blood, especially in setting of Eliquis  use. No weakness, shortness of breath, chest pain, palpitations, or dark stools. No constipation but mentions not  producing much stool. She has had 2 iron transfusions per GI.   She reports sensitivity and irritation in her vaginal area, describing it as 'sensitive' and 'burning,' but denies pain during urination or increased frequency. She is unsure if there is a rash as she cannot see the area.  She confirms eating three meals a day with no changes in appetite.   She has dementia with progressive memory loss. She is unsure about the day of the week and month. She is followed by [facility]. Last follow up on 02/15/2024. Was started on aricept 5 mg daily.   Hx of a fib- followed by cardiology, continues on amiodarone  reduced to daily due to elevated TSH  Continues on omeprazole - denies GERD.  No N or vomiting.   Past Medical History:  Diagnosis Date   Allergic asthma    Allergic rhinitis due to pollen    Allergy    Dementia (HCC)    GERD (gastroesophageal reflux disease)    IBS (irritable bowel syndrome)    MCI (mild cognitive impairment) 12/02/2015   Osteopenia    Paroxysmal atrial fibrillation (HCC) 02/16/2022   Unspecified hypothyroidism    Past Surgical History:  Procedure Laterality Date   ANAL FISSURE REPAIR  1950's   BASAL CELL CARCINOMA EXCISION  2000's   nose   CHOLECYSTECTOMY  2008   COLONOSCOPY  Multiple   Negative screening exams   ENDOSCOPIC RETROGRADE CHOLANGIOPANCREATOGRAPHY (ERCP) WITH PROPOFOL  N/A 02/13/2019   Procedure: ENDOSCOPIC RETROGRADE CHOLANGIOPANCREATOGRAPHY (ERCP) WITH PROPOFOL ;  Surgeon: Avram Lupita BRAVO, MD;  Location: [facility] ENDOSCOPY;  Service: Endoscopy;  Laterality: N/A;   HEMORRHOID SURGERY  1953 or so  with fissure repair   ORIF ANKLE FRACTURE Right 06/27/2020   Procedure: OPEN REDUCTION INTERNAL FIXATION (ORIF) ANKLE FRACTURE;  Surgeon: Lennie Barter, DPM;  Location: [facility] ORS;  Service: Podiatry;  Laterality: Right;   REMOVAL OF STONES  02/13/2019   Procedure: REMOVAL OF STONES;  Surgeon: Avram Lupita BRAVO, MD;  Location: [facility] ENDOS[facility];  Service:  Endoscopy;;   SPHINCTEROTOMY  02/13/2019   Procedure: ANNETT;  Surgeon: Avram Lupita BRAVO, MD;  Location: [facility];  Service: Endoscopy;;   TUBAL LIGATION      Allergies  Allergen Reactions   Penicillins Other (See Comments)    Did it involve swelling of the face/tongue/throat, SOB, or low BP? No Did it involve sudden or severe rash/hives, skin peeling, or any reaction on the inside of your mouth or nose? No Did you need to seek medical attention at a hospital or doctor's office? Yes When did it last happen?      20+ years If all above answers are NO, may proceed with cephalosporin use.  Pt does not remember the reaction   Grass Pollen(K-O-R-T-Swt Vern) Other (See Comments)   Latex Rash    Outpatient Encounter Medications as of 02/29/2024  Medication Sig   acetaminophen  (TYLENOL ) 325 MG tablet Take 650 mg by mouth every 4 (four) hours as needed.   albuterol  (VENTOLIN  HFA) 108 (90 Base) MCG/ACT inhaler Inhale 2 puffs into the lungs 3 (three) times daily as needed.   aluminum-magnesium hydroxide 200-200 MG/5ML suspension Take 10 mLs by mouth every 4 (four) hours as needed for indigestion.   amiodarone  (PACERONE ) 100 MG tablet Take 1 tablet (100 mg total) by mouth daily. TAKE ONE TABLET (200 MG) BY MOUTH EVERY DAY   apixaban  (ELIQUIS ) 5 MG TABS tablet Take 1 tablet (5 mg total) by mouth 2 (two) times daily.   bismuth subsalicylate (PEPTO BISMOL) 262 MG/15ML suspension Take 30 mLs by mouth as needed.   carbamide peroxide (DEBROX) 6.5 % OTIC solution Place 5 drops into both ears at bedtime.   carboxymethylcellulose (REFRESH PLUS) 0.5 % SOLN Place 2 drops into both eyes 3 (three) times daily as needed.   cetirizine (ZYRTEC) 10 MG chewable tablet Chew 10 mg by mouth daily.   dextromethorphan-guaiFENesin  (ROBITUSSIN-DM) 10-100 MG/5ML liquid Take 5 mLs by mouth every 4 (four) hours as needed for cough.   diclofenac  Sodium (VOLTAREN ) 1 % GEL Apply topically as needed.   donepezil  (ARICEPT) 5 MG tablet Take 5 mg by mouth at bedtime.   Glucose 15 GM/32ML GEL One Packet by mouth as needed.   levothyroxine  (SYNTHROID ) 112 MCG tablet Take 1 tablet (112 mcg total) by mouth daily before breakfast.   loperamide  (IMODIUM  A-D) 2 MG tablet Take 2 mg by mouth daily.   Loratadine 10 MG CAPS Take 1 capsule by mouth at bedtime.   magnesium hydroxide (MILK OF MAGNESIA) 400 MG/5ML suspension Take 5 mLs by mouth daily as needed for mild constipation.   nystatin (MYCOSTATIN/NYSTOP) powder Apply 1 Application topically 2 (two) times daily.   omeprazole  (PRILOSEC) 20 MG capsule Take 20 mg by mouth daily.   ondansetron  (ZOFRAN ) 4 MG tablet Take 4 mg by mouth every 8 (eight) hours as needed for nausea or vomiting.   OXYGEN Inhale into the lungs. 2lpm   polyethylene glycol (MIRALAX / GLYCOLAX) 17 g packet Take 17 g by mouth daily. Mon, Wed and Friday   psyllium (METAMUCIL) 58.6 % packet Take 1 packet by mouth daily as needed.   [DISCONTINUED] ferrous sulfate  325 (65 FE) MG EC tablet Ferrous sulfate 325 mg daily indefinitely (Patient not taking: Reported on 02/29/2024)   [DISCONTINUED] ferrous sulfate 325 (65 FE) MG tablet Take 1 tablet (325 mg total) by mouth daily with breakfast. (Patient not taking: Reported on 02/29/2024)   [DISCONTINUED] Wheat Dextrin (BENEFIBER) POWD Take 1 Dose by mouth daily. (Patient not taking: Reported on 02/29/2024)   No facility-administered encounter medications on file as of 02/29/2024.    Review of Systems  Constitutional:  Negative for activity change, appetite change, fatigue and unexpected weight change.  HENT:  Negative for congestion and hearing loss.   Eyes: Negative.   Respiratory:  Negative for cough and shortness of breath.   Cardiovascular:  Negative for chest pain, palpitations and leg swelling.  Gastrointestinal:  Negative for abdominal pain, constipation and diarrhea.  Genitourinary:  Negative for difficulty urinating and dysuria.   Musculoskeletal:  Negative for arthralgias and myalgias.  Skin:  Negative for color change and wound.  Neurological:  Negative for dizziness and weakness.  Psychiatric/Behavioral:  Positive for confusion. Negative for agitation and behavioral problems.     Immunization History  Administered Date(s) Administered   Fluad Quad(high Dose 65+) 01/14/2019, 02/04/2020, 02/05/2021   INFLUENZA, HIGH DOSE SEASONAL PF 03/03/2014   Influenza Split 01/12/2011, 02/18/2012, 02/08/2013   Influenza,inj,Quad PF,6+ Mos 12/22/2014, 12/25/2015, 12/30/2016, 01/30/2018   Influenza-Unspecified 02/15/2022, 01/31/2023, 02/13/2024   Moderna Sars-Covid-2 Vaccination 05/16/2019, 06/13/2019, 03/13/2020, 09/17/2020   Pfizer Covid-19 Vaccine Bivalent Booster 44yrs & up 01/21/2021   Pneumococcal Conjugate-13 12/19/2013   Pneumococcal Polysaccharide-23 12/30/2016   Pneumococcal-Unspecified 05/02/2006   Td 02/16/2011   Tdap 06/26/2020   Unspecified SARS-COV-2 Vaccination 01/27/2023, 08/11/2023, 02/23/2024   Zoster Recombinant(Shingrix) 01/12/2018, 03/31/2018   Zoster, Live 06/11/2009   Pertinent  Health Maintenance Due  Topic Date Due   DEXA SCAN  Never done   Influenza Vaccine  Completed   Mammogram  Discontinued      02/11/2022   10:15 AM 03/13/2022    5:14 PM 04/18/2022    9:07 AM 02/21/2023   11:44 AM 09/07/2023   11:34 AM  Fall Risk  Falls in the past year?    0 0  Was there an injury with Fall?    0   Fall Risk Category Calculator    0   (RETIRED) Patient Fall Risk Level Moderate fall risk  Low fall risk  Low fall risk     Patient at Risk for Falls Due to    No Fall Risks   Fall risk Follow up    Falls evaluation completed      Data saved with a previous flowsheet row definition   Functional Status Survey:    Vitals:   02/29/24 1147  BP: (!) 147/78  Pulse: 68  Resp: 20  Temp: 97.9 F (36.6 C)  SpO2: 97%  Weight: 153 lb 6.4 oz (69.6 kg)  Height: 5' 6 (1.676 m)   Body mass index is  24.76 kg/m. Physical Exam Constitutional:      General: She is not in acute distress.    Appearance: She is well-developed. She is not diaphoretic.  HENT:     Head: Normocephalic and atraumatic.     Mouth/Throat:     Pharynx: No oropharyngeal exudate.  Eyes:     Conjunctiva/sclera: Conjunctivae normal.     Pupils: Pupils are equal, round, and reactive to light.  Cardiovascular:     Rate and Rhythm: Normal rate and regular rhythm.     Heart sounds:  Normal heart sounds.  Pulmonary:     Effort: Pulmonary effort is normal.     Breath sounds: Normal breath sounds.  Abdominal:     General: Bowel sounds are normal.     Palpations: Abdomen is soft.  Musculoskeletal:     Cervical back: Normal range of motion and neck supple.     Right lower leg: No edema.     Left lower leg: No edema.  Skin:    General: Skin is warm and dry.  Neurological:     Mental Status: She is alert.  Psychiatric:        Mood and Affect: Mood normal.     Labs reviewed: Recent Labs    05/17/23 1000 10/13/23 1538  NA 138 138  K 4.6 5.0  CL 102 103  CO2 21 17*  GLUCOSE 77 108*  BUN 17 23  CREATININE 0.94 0.94  CALCIUM 9.1 9.3   Recent Labs    05/17/23 1000  AST 19  ALT 11  ALKPHOS 67  BILITOT 0.3  PROT 6.7  ALBUMIN 4.2   Recent Labs    08/28/23 0000 10/13/23 1538 11/27/23 0000 01/12/24 1130  WBC 6.5 6.8 7.2 6.3  NEUTROABS 3,361.00  --  3,082.00 3.1  HGB 11.8* 9.8* 8.6* 8.8 Repeated and verified X2.*  HCT 39 32.4* 31* 29.1*  MCV  --  82  --  67.6 Repeated and verified X2.*  PLT 407* 424 436* 416.0*   Lab Results  Component Value Date   TSH 8.140 (H) 01/15/2024   No results found for: HGBA1C Lab Results  Component Value Date   CHOL 172 02/04/2020   HDL 59.60 02/04/2020   LDLCALC 96 02/04/2020   TRIG 83.0 02/04/2020   CHOLHDL 3 02/04/2020    Significant Diagnostic Results in last 30 days:  No results found.  Assessment/Plan Paroxysmal atrial fibrillation (HCC) Rate  controlled. Continues on amiodarone  daily  Continues on eliquis  for anticoagulation.   IBS (irritable bowel syndrome) Stable, without current complaints at this time.  Hypothyroidism TSH was elevated, synthroid  increased and amio reduced, will follow up TSH   Gastroesophageal reflux disease without esophagitis Symptoms controlled on omeprazole .   Anemia Noted to have colonic lesions that were thought to be source of bleeding. Iron tablets added daily to see if this will sustain her hgb otherwise she would need to go to hospital for another colonoscopy to ablate the AVMs. Should be on ferrous sulfate 325 mg daily with breakfast   Osteopenia Add vit d 2000 units daily  Vaginal atrophy  sensitivity and irritation likely due to atrophic changes from decreased estrogen levels. - Order vaginal lubricant PRN. - Advise against non-prescribed creams or antibiotics.   Sukaina Toothaker K. Caro BODILY [facility] [phone]

## 2024-02-29 NOTE — Assessment & Plan Note (Signed)
 TSH was elevated, synthroid  increased and amio reduced, will follow up TSH

## 2024-02-29 NOTE — Assessment & Plan Note (Addendum)
 Noted to have colonic lesions that were thought to be source of bleeding. Iron tablets added daily to see if this will sustain her hgb otherwise she would need to go to hospital for another colonoscopy to ablate the AVMs. Should be on ferrous sulfate 325 mg daily with breakfast

## 2024-03-01 LAB — SURGICAL PATHOLOGY

## 2024-03-04 DIAGNOSIS — D509 Iron deficiency anemia, unspecified: Secondary | ICD-10-CM | POA: Diagnosis not present

## 2024-03-04 DIAGNOSIS — E039 Hypothyroidism, unspecified: Secondary | ICD-10-CM | POA: Diagnosis not present

## 2024-03-04 DIAGNOSIS — I2510 Atherosclerotic heart disease of native coronary artery without angina pectoris: Secondary | ICD-10-CM | POA: Diagnosis not present

## 2024-03-08 ENCOUNTER — Ambulatory Visit: Payer: Self-pay | Admitting: Internal Medicine

## 2024-03-08 DIAGNOSIS — D500 Iron deficiency anemia secondary to blood loss (chronic): Secondary | ICD-10-CM

## 2024-03-08 NOTE — Telephone Encounter (Signed)
 This patient has iron deficiency anemia.  Her gastric biopsies were negative.  She has colon angiodysplasia which may be the cause of anemia.  I prescribed ferrous sulfate when she was at colonoscopy and EGD but somehow this came off the list.  Please do the following.  #1 call daughter (patient is demented) and explain stomach biopsies do not show a source of blood loss that I think it is coming from lesions in the colon.  #2 she needs to stay on ferrous sulfate 325 mg daily.  #3 we have a note in the chart that says she was getting an iron infusion, I do not know where that was but that is a good idea and I hope it did happen.  If daughter does not know can speak to Maude Gaskins, RN (see note 01/12/2024) at [facility].  If she needs a referral for one we can do that-hematology  #4 CBC and ferritin in 2 months  Encounter Diagnosis  Name Primary?   Iron deficiency anemia due to chronic blood loss Yes

## 2024-04-14 NOTE — Progress Notes (Unsigned)
 Electrophysiology Clinic Note    Date:  04/15/2024  Patient ID:  Ariel, Phillips April 06, 1933, MRN [id number] PCP:  Laurence Locus, DO  Cardiologist:  Redell Cave, MD   Electrophysiologist:  Fonda Kitty, MD  Electrophysiology APP:  Angelo Prindle, NP    Discussed the use of AI scribe software for clinical note transcription with the patient, who gave verbal consent to proceed.   Patient Profile   Chief Complaint: Afib follow-up  History of Present Illness: Ariel Phillips is a 89 y.o. female with PMH notable for persis Afib, non-obs CAD, hypothyroid, IDA, mod dementia; seen today for Fonda Kitty, MD (formerly Dr. Cindie) for routine electrophysiology followup.   Afib initially diagnosed 2023 by EMS with c/o palpitations. She was started on amiodarone /eliquis .   She last saw Dr. Cindie 05/2023 at which time she was doing well without complaints. Not ablation candidate d/t mental status, age, amiodarone  continued.    She saw NP [place] 10/2023 where she c/o dark stools. Labwork with reduced HgB, also hypothyroid. Amio reduced to 100mg  daily and rec to follow-up with PCP. I saw her in follow-up 01/2024 where amio had not been reduced, planning for colonoscopy soon. Colo done with few, small angiodysplasic lesions without bleeding, overall normal and eliquis  continued.   On follow-up today, she has no acute concerns. She is joined by employee from facility who is loosely involved in patient's care. Patient's nurse joins by speaker phone. She denies AF episodes or concerns. No bleeding on eliquis  BID. Med list includes amiodarone  100mg  daily. Patient is not sure whether she takes it, but takes all meds given by facility.  Patient denies chest pain, chest pressure, SOB, reduced energy.   CMP, thyroid  labs done recently at facility, will be faxed.    Arrhythmia/Device History Amiodarone     ROS:  Please see the history of present illness. All other systems are  reviewed and otherwise negative.    Physical Exam    VS:  BP 130/70 (BP Location: Left Arm, Patient Position: Sitting, Cuff Size: Normal)   Pulse 83   Ht 5' 6 (1.676 m)   Wt 152 lb 9.6 oz (69.2 kg)   SpO2 98%   BMI 24.63 kg/m  BMI: Body mass index is 24.63 kg/m.      Wt Readings from Last 3 Encounters:  04/15/24 152 lb 9.6 oz (69.2 kg)  02/29/24 153 lb 6.4 oz (69.6 kg)  02/27/24 155 lb (70.3 kg)     GEN- The patient is well appearing, alert, oriented x 2 (self, president) Lungs- Clear to ausculation bilaterally, normal work of breathing.  Heart- Regular rate and rhythm, no murmurs, rubs or gallops Extremities- No peripheral edema, warm, dry   Studies Reviewed   Previous EP, cardiology notes.    EKG is ordered. Personal review of EKG from today shows:    EKG Interpretation Date/Time:  Monday April 15 2024 10:06:59 EST Ventricular Rate:  83 PR Interval:  206 QRS Duration:  150 QT Interval:  428 QTC Calculation: 502 R Axis:   47  Text Interpretation: Normal sinus rhythm with 1st degree A-V block Right bundle branch block Confirmed by Sakinah Rosamond [phone]) on 04/15/2024 10:11:28 AM    TTE, 03/22/2022  1. Left ventricular ejection fraction, by estimation, is 60 to 65%. The left ventricle has normal function. The left ventricle has no regional wall motion abnormalities. There is mild left ventricular hypertrophy of the basal-septal segment. Left ventricular diastolic parameters are consistent with Grade  I diastolic dysfunction (impaired relaxation). The average left ventricular global longitudinal strain is -17.9 %.   2. Right ventricular systolic function is normal. The right ventricular size is normal.   3. The mitral valve is normal in structure. Mild to moderate mitral valve regurgitation. No evidence of mitral stenosis.   4. The aortic valve is tricuspid. Aortic valve regurgitation is mild. No aortic stenosis is present.   5. There is borderline dilatation of the  ascending aorta, measuring 39 mm.   6. The inferior vena cava is normal in size with greater than 50% respiratory variability, suggesting right atrial pressure of 3 mmHg.   Comparison(s): 07/11/19-EF 60-65%.   CT coronary, 07/21/2019 1. Coronary calcium score of 200. This was 43 percentile for age and sex matched control. 2. Normal coronary origin with right dominance. 3. Atleast moderate stenosis of the proximal and mid to distal LAD.  4. CAD-RADS 3. Moderate stenosis. Consider symptom-guided anti-ischemic pharmacotherapy as well as risk factor modification per guideline directed care. Additional analysis with CT FFR will be submitted.   Assessment and Plan     #) persis Afib #) amiodarone  monitoring Maintaining sinus rhythm on amiodarone  Continue 100mg  amiodarone  daily Recent LFTs, TSH stable at facility - will be sent from facility  #) Hypercoag d/t  afib CHA2DS2-VASc Score = at least 3 [CHF History: 0, HTN History: 0, Diabetes History: 0, Stroke History: 0, Vascular Disease History: 0, Age Score: 2, Gender Score: 1].  Therefore, the patient's annual risk of stroke is 3.2 %.    Stroke ppx - 5mg  eliquis  BID, appropriately dosed Recent CBC stable       Current medicines are reviewed at length with the patient today.   The patient does not have concerns regarding her medicines.  The following changes were made today:   None  Labs/ tests ordered today include:  Orders Placed This Encounter  Procedures   EKG 12-Lead     Disposition: Follow up with Dr. Kennyth or EP APP in 6 months   Signed, Ashleah Valtierra, NP  04/15/2024  12:45 PM  Electrophysiology [facility]

## 2024-04-15 ENCOUNTER — Ambulatory Visit: Attending: Cardiology | Admitting: Cardiology

## 2024-04-15 ENCOUNTER — Encounter: Payer: Self-pay | Admitting: Cardiology

## 2024-04-15 VITALS — BP 130/70 | HR 83 | Ht 66.0 in | Wt 152.6 lb

## 2024-04-15 DIAGNOSIS — Z79899 Other long term (current) drug therapy: Secondary | ICD-10-CM | POA: Insufficient documentation

## 2024-04-15 DIAGNOSIS — I4819 Other persistent atrial fibrillation: Secondary | ICD-10-CM | POA: Diagnosis present

## 2024-04-15 DIAGNOSIS — Z5181 Encounter for therapeutic drug level monitoring: Secondary | ICD-10-CM | POA: Insufficient documentation

## 2024-04-15 DIAGNOSIS — E039 Hypothyroidism, unspecified: Secondary | ICD-10-CM | POA: Insufficient documentation

## 2024-04-15 DIAGNOSIS — D6869 Other thrombophilia: Secondary | ICD-10-CM | POA: Diagnosis present

## 2024-04-15 NOTE — Patient Instructions (Signed)
 Medication Instructions:  Your physician recommends that you continue on your current medications as directed. Please refer to the Current Medication list given to you today.    *If you need a refill on your cardiac medications before your next appointment, please call your pharmacy*  Lab Work: No labs ordered today    Testing/Procedures: No test ordered today   Follow-Up: At [facility], you and your health needs are our priority.  As part of our continuing mission to provide you with exceptional heart care, our providers are all part of one team.  This team includes your primary Cardiologist (physician) and Advanced Practice Providers or APPs (Physician Assistants and Nurse Practitioners) who all work together to provide you with the care you need, when you need it.  Your next appointment:   6 month(s)  Provider:   Suzann Riddle, NP

## 2024-04-22 ENCOUNTER — Telehealth: Payer: Self-pay

## 2024-04-22 NOTE — Telephone Encounter (Signed)
 I spoke with Ariel Phillips who confirmed that any available recent labs for Ariel Phillips will be faxed to our office.

## 2024-04-22 NOTE — Telephone Encounter (Signed)
 I left a voicemail for [name] of [facility], requesting that recent labs on Ariel Phillips be faxed to our team.

## 2024-04-22 NOTE — Telephone Encounter (Signed)
 [name] returned your call to f/u please advise

## 2024-04-23 ENCOUNTER — Encounter: Payer: Self-pay | Admitting: Cardiology

## 2024-04-23 ENCOUNTER — Telehealth: Payer: Self-pay

## 2024-04-23 NOTE — Telephone Encounter (Signed)
 Labs received from [facility]. Suzann Riddle, NP notified.

## 2024-05-16 ENCOUNTER — Non-Acute Institutional Stay: Payer: Self-pay | Admitting: Nurse Practitioner

## 2024-05-16 ENCOUNTER — Encounter: Payer: Self-pay | Admitting: Nurse Practitioner

## 2024-05-16 DIAGNOSIS — K580 Irritable bowel syndrome with diarrhea: Secondary | ICD-10-CM | POA: Diagnosis not present

## 2024-05-16 DIAGNOSIS — I480 Paroxysmal atrial fibrillation: Secondary | ICD-10-CM | POA: Diagnosis not present

## 2024-05-16 DIAGNOSIS — F03B0 Unspecified dementia, moderate, without behavioral disturbance, psychotic disturbance, mood disturbance, and anxiety: Secondary | ICD-10-CM

## 2024-05-16 DIAGNOSIS — K219 Gastro-esophageal reflux disease without esophagitis: Secondary | ICD-10-CM

## 2024-05-16 DIAGNOSIS — D649 Anemia, unspecified: Secondary | ICD-10-CM

## 2024-05-16 DIAGNOSIS — E039 Hypothyroidism, unspecified: Secondary | ICD-10-CM

## 2024-05-16 DIAGNOSIS — D500 Iron deficiency anemia secondary to blood loss (chronic): Secondary | ICD-10-CM

## 2024-05-16 NOTE — Progress Notes (Signed)
 " Location:  Other [facility].  Nursing Home Room Number: Delphine [place] [id number] Place of Service:  ALF (13) [facility]  PCP: Laurence Locus, DO  Patient Care Team: Laurence Locus, DO as PCP - General (Internal Medicine) Darliss Rogue, MD as PCP - Cardiology (Cardiology) Kennyth Chew, MD as PCP - Electrophysiology (Clinical Cardiac Electrophysiology) Fate Morna SAILOR, [facility] (Inactive) as Pharmacist (Pharmacist) Riddle, Suzann, NP as Nurse Practitioner (Clinical Cardiac Electrophysiology)  Extended Emergency Contact Information Primary Emergency Contact: Maritza Rhoda [place], [place] Home Phone: [phone] Work Phone: [phone] Mobile Phone: [phone] Relation: Daughter  Goals of care: Advanced Directive information    05/16/2024   12:59 PM  Advanced Directives  Does Patient Have a Medical Advance Directive? Yes  Type of Estate Agent of [place];Living will;Out of facility DNR (pink MOST or yellow form)  Does patient want to make changes to medical advance directive? No - Patient declined  Copy of Healthcare Power of Attorney in Chart? Yes - validated most recent copy scanned in chart (See row information)     Chief Complaint  Patient presents with   Medical Management of Chronic Issues    Medical Management of Chronic Issues.     HPI:  Pt is a 89 y.o. female seen today for medical management of chronic disease. She is a long term resident at [facility]. Staff gives her medication.   She has a history of iron deficiency anemia, which was previously significant but has now resolved. A colonoscopy and endoscopy in October revealed potential sources of bleeding in the colon. She has been taking iron tablets, which have successfully normalized her hemoglobin levels. Hgb today was normal  She has hypothyroidism and is currently on Synthroid  112 mcg daily. Her TSH level has decreased to 5.29 on  last labs.  She has atrial fibrillation and is on amiodarone  100 mg daily for rate control and Eliquis  for anticoagulation. No palpitations are reported.   She has a history of memory issues and is currently taking Aricept 5 mg at bedtime to help preserve memory. No significant memory concerns are reported at this time.  She has a history of irritable bowel syndrome, previously experiencing diarrhea but now reports no diarrhea or constipation. She is on imodium  daily and reports solid regular stools  She is also on Prilosec for indigestion and reports no acid reflux symptoms. No constipation, diarrhea, palpitations, acid reflux, or pain.     Past Medical History:  Diagnosis Date   Allergic asthma    Allergic rhinitis due to pollen    Allergy    Dementia (HCC)    GERD (gastroesophageal reflux disease)    IBS (irritable bowel syndrome)    MCI (mild cognitive impairment) 12/02/2015   Osteopenia    Paroxysmal atrial fibrillation (HCC) 02/16/2022   Unspecified hypothyroidism    Past Surgical History:  Procedure Laterality Date   ANAL FISSURE REPAIR  1950's   BASAL CELL CARCINOMA EXCISION  2000's   nose   CHOLECYSTECTOMY  2008   COLONOSCOPY  Multiple   Negative screening exams   ENDOSCOPIC RETROGRADE CHOLANGIOPANCREATOGRAPHY (ERCP) WITH PROPOFOL  N/A 02/13/2019   Procedure: ENDOSCOPIC RETROGRADE CHOLANGIOPANCREATOGRAPHY (ERCP) WITH PROPOFOL ;  Surgeon: Avram Lupita BRAVO, MD;  Location: [facility];  Service: Endoscopy;  Laterality: N/A;   HEMORRHOID SURGERY  1953 or so   with fissure repair   ORIF ANKLE FRACTURE Right 06/27/2020   Procedure: OPEN REDUCTION INTERNAL  FIXATION (ORIF) ANKLE FRACTURE;  Surgeon: Lennie Barter, DPM;  Location: [facility];  Service: Podiatry;  Laterality: Right;   REMOVAL OF STONES  02/13/2019   Procedure: REMOVAL OF STONES;  Surgeon: Avram Lupita BRAVO, MD;  Location: [facility];  Service: Endoscopy;;   SPHINCTEROTOMY  02/13/2019   Procedure: ANNETT;   Surgeon: Avram Lupita BRAVO, MD;  Location: [facility];  Service: Endoscopy;;   TUBAL LIGATION      Allergies[1]  Outpatient Encounter Medications as of 05/16/2024  Medication Sig   acetaminophen  (TYLENOL ) 325 MG tablet Take 650 mg by mouth every 4 (four) hours as needed.   albuterol  (VENTOLIN  HFA) 108 (90 Base) MCG/ACT inhaler Inhale 2 puffs into the lungs 3 (three) times daily as needed.   aluminum-magnesium hydroxide 200-200 MG/5ML suspension Take 10 mLs by mouth every 4 (four) hours as needed for indigestion.   amiodarone  (PACERONE ) 100 MG tablet Take 1 tablet (100 mg total) by mouth daily. TAKE ONE TABLET (200 MG) BY MOUTH EVERY DAY   apixaban  (ELIQUIS ) 5 MG TABS tablet Take 1 tablet (5 mg total) by mouth 2 (two) times daily.   bismuth subsalicylate (PEPTO BISMOL) 262 MG/15ML suspension Take 30 mLs by mouth as needed.   carbamide peroxide (DEBROX) 6.5 % OTIC solution Place 5 drops into both ears at bedtime.   carboxymethylcellulose (REFRESH PLUS) 0.5 % SOLN Place 2 drops into both eyes 3 (three) times daily as needed.   cetirizine (ZYRTEC) 10 MG chewable tablet Chew 10 mg by mouth daily.   Cholecalciferol (VITAMIN D) 50 MCG (2000 UT) CAPS Take 2,000 Units by mouth daily.   dextromethorphan-guaiFENesin  (ROBITUSSIN-DM) 10-100 MG/5ML liquid Take 5 mLs by mouth every 4 (four) hours as needed for cough.   diclofenac  Sodium (VOLTAREN ) 1 % GEL Apply topically as needed.   donepezil (ARICEPT) 5 MG tablet Take 5 mg by mouth at bedtime.   ferrous sulfate  325 (65 FE) MG EC tablet Take 325 mg by mouth daily.   Glucose 15 GM/32ML GEL One Packet by mouth as needed.   levothyroxine  (SYNTHROID ) 112 MCG tablet Take 1 tablet (112 mcg total) by mouth daily before breakfast.   loperamide  (IMODIUM  A-D) 2 MG tablet Take 2 mg by mouth daily.   Loratadine 10 MG CAPS Take 1 capsule by mouth at bedtime.   magnesium hydroxide (MILK OF MAGNESIA) 400 MG/5ML suspension Take 5 mLs by mouth daily as needed for mild  constipation.   nystatin (MYCOSTATIN/NYSTOP) powder Apply 1 Application topically 2 (two) times daily.   omeprazole  (PRILOSEC) 20 MG capsule Take 20 mg by mouth daily.   ondansetron  (ZOFRAN ) 4 MG tablet Take 4 mg by mouth every 8 (eight) hours as needed for nausea or vomiting.   OXYGEN Inhale into the lungs. 2lpm   polyethylene glycol (MIRALAX / GLYCOLAX) 17 g packet Take 17 g by mouth daily. Mon, Wed and Friday   psyllium (METAMUCIL) 58.6 % packet Take 1 packet by mouth daily as needed.   No facility-administered encounter medications on file as of 05/16/2024.    Review of Systems  Constitutional:  Negative for activity change, appetite change, fatigue and unexpected weight change.  HENT:  Negative for congestion and hearing loss.   Eyes: Negative.   Respiratory:  Negative for cough and shortness of breath.   Cardiovascular:  Negative for chest pain, palpitations and leg swelling.  Gastrointestinal:  Negative for abdominal pain, constipation and diarrhea.  Genitourinary:  Negative for difficulty urinating and dysuria.  Musculoskeletal:  Negative for arthralgias  and myalgias.  Skin:  Negative for color change and wound.  Neurological:  Negative for dizziness and weakness.  Psychiatric/Behavioral:  Positive for confusion. Negative for agitation and behavioral problems.      Immunization History  Administered Date(s) Administered   Fluad Quad(high Dose 65+) 01/14/2019, 02/04/2020, 02/05/2021   INFLUENZA, HIGH DOSE SEASONAL PF 03/03/2014   Influenza Split 01/12/2011, 02/18/2012, 02/08/2013   Influenza,inj,Quad PF,6+ Mos 12/22/2014, 12/25/2015, 12/30/2016, 01/30/2018   Influenza-Unspecified 02/15/2022, 01/31/2023, 02/13/2024   Moderna Sars-Covid-2 Vaccination 05/16/2019, 06/13/2019, 03/13/2020, 09/17/2020   Pfizer Covid-19 Vaccine Bivalent Booster 8yrs & up 01/21/2021   Pneumococcal Conjugate-13 12/19/2013   Pneumococcal Polysaccharide-23 12/30/2016   Pneumococcal-Unspecified  05/02/2006   Td 02/16/2011   Tdap 06/26/2020   Unspecified SARS-COV-2 Vaccination 01/27/2023, 08/11/2023, 02/23/2024   Zoster Recombinant(Shingrix) 01/12/2018, 03/31/2018   Zoster, Live 06/11/2009   Pertinent  Health Maintenance Due  Topic Date Due   Bone Density Scan  Never done   Influenza Vaccine  Completed   Mammogram  Discontinued      02/11/2022   10:15 AM 03/13/2022    5:14 PM 04/18/2022    9:07 AM 02/21/2023   11:44 AM 09/07/2023   11:34 AM  Fall Risk  Falls in the past year?    0 0  Was there an injury with Fall?    0    Fall Risk Category Calculator    0   (RETIRED) Patient Fall Risk Level Moderate fall risk  Low fall risk  Low fall risk     Patient at Risk for Falls Due to    No Fall Risks   Fall risk Follow up    Falls evaluation completed      Data saved with a previous flowsheet row definition   Functional Status Survey:    Vitals:   05/16/24 1252  BP: 111/65  Pulse: 75  Resp: 18  Temp: (!) 97.4 F (36.3 C)  SpO2: 94%  Weight: 152 lb 3.2 oz (69 kg)  Height: 5' 6 (1.676 m)   Body mass index is 24.57 kg/m. Physical Exam Constitutional:      General: She is not in acute distress.    Appearance: Normal appearance. She is well-developed. She is not diaphoretic.  HENT:     Head: Normocephalic and atraumatic.     Mouth/Throat:     Pharynx: No oropharyngeal exudate.  Eyes:     Conjunctiva/sclera: Conjunctivae normal.     Pupils: Pupils are equal, round, and reactive to light.  Cardiovascular:     Rate and Rhythm: Normal rate and regular rhythm.     Heart sounds: Normal heart sounds.  Pulmonary:     Effort: Pulmonary effort is normal.     Breath sounds: Normal breath sounds.  Abdominal:     General: Bowel sounds are normal.     Palpations: Abdomen is soft.  Musculoskeletal:        General: No tenderness.     Cervical back: Normal range of motion and neck supple.  Skin:    General: Skin is warm and dry.  Neurological:     Mental Status: She  is alert. Mental status is at baseline.  Psychiatric:        Mood and Affect: Mood normal.     Labs reviewed: Recent Labs    10/13/23 1538  NA 138  K 5.0  CL 103  CO2 17*  GLUCOSE 108*  BUN 23  CREATININE 0.94  CALCIUM 9.3   No results for input(s):  AST, ALT, ALKPHOS, BILITOT, PROT, ALBUMIN in the last 8760 hours. Recent Labs    08/28/23 0000 10/13/23 1538 11/27/23 0000 01/12/24 1130  WBC 6.5 6.8 7.2 6.3  NEUTROABS 3,361.00  --  3,082.00 3.1  HGB 11.8* 9.8* 8.6* 8.8 Repeated and verified X2.*  HCT 39 32.4* 31* 29.1*  MCV  --  82  --  67.6 Repeated and verified X2.*  PLT 407* 424 436* 416.0*   Lab Results  Component Value Date   TSH 8.140 (H) 01/15/2024   No results found for: HGBA1C Lab Results  Component Value Date   CHOL 172 02/04/2020   HDL 59.60 02/04/2020   LDLCALC 96 02/04/2020   TRIG 83.0 02/04/2020   CHOLHDL 3 02/04/2020    Significant Diagnostic Results in last 30 days:  No results found.  Assessment/Plan  Hypothyroidism Well-managed with Synthroid  112 mcg daily. TSH level is 5.29, within normal range. - Continue Synthroid  112 mcg daily.  Atrial fibrillation Managed with amiodarone  100 mg daily and Eliquis . No palpitations. Recent cardiology follow-up showed no changes. - Continue amiodarone  100 mg daily. - Continue Eliquis  for anticoagulation.  Alzheimer's disease Managed with Aricept 5 mg at bedtime. Discussed adding Namenda for memory preservation. She is open to starting Namenda. - Initiated Namenda for memory preservation.  Irritable bowel syndrome No significant issues with constipation or diarrhea. Not on any laxatives. Continues on Imodium  daily  - Continue current management as symptoms are well-controlled.  GERD Controlled on omeprazole   Iron def anemia Anemia has resolved with iron supplement Will continue at this time.    Buck Mcaffee K. Caro BODILY [facility] [phone]        [1]  Allergies Allergen Reactions   Penicillins Other (See Comments)    Did it involve swelling of the face/tongue/throat, SOB, or low BP? No Did it involve sudden or severe rash/hives, skin peeling, or any reaction on the inside of your mouth or nose? No Did you need to seek medical attention at a hospital or doctor's office? Yes When did it last happen?      20+ years If all above answers are NO, may proceed with cephalosporin use.  Pt does not remember the reaction   Grass Pollen(K-O-R-T-Swt Vern) Other (See Comments)   Latex Rash   "

## 2024-05-22 ENCOUNTER — Telehealth: Payer: Self-pay | Admitting: Internal Medicine

## 2024-05-22 NOTE — Telephone Encounter (Signed)
 Please let daughter know that complete blood count is normal and iron level is normal though in order to maintain things I recommend that the patient stay on ferrous sulfate  daily.  Follow-up with primary care see GI as needed.

## 2024-05-23 NOTE — Telephone Encounter (Signed)
 Called the daughter's phone. No answer. Left information on the voicemail with my name and telephone number for any follow up questions.
